# Patient Record
Sex: Female | Born: 1965
Health system: Southern US, Community
[De-identification: ages and names within clinical notes are randomized; demographics above are authoritative.]

## PROBLEM LIST (undated history)

## (undated) DIAGNOSIS — I671 Cerebral aneurysm, nonruptured: Secondary | ICD-10-CM

## (undated) DIAGNOSIS — G8929 Other chronic pain: Secondary | ICD-10-CM

## (undated) DIAGNOSIS — F329 Major depressive disorder, single episode, unspecified: Secondary | ICD-10-CM

## (undated) DIAGNOSIS — I7300 Raynaud's syndrome without gangrene: Secondary | ICD-10-CM

## (undated) DIAGNOSIS — F419 Anxiety disorder, unspecified: Secondary | ICD-10-CM

## (undated) DIAGNOSIS — I10 Essential (primary) hypertension: Secondary | ICD-10-CM

## (undated) DIAGNOSIS — E0500 Thyrotoxicosis with diffuse goiter without thyrotoxic crisis or storm: Secondary | ICD-10-CM

## (undated) DIAGNOSIS — M1990 Unspecified osteoarthritis, unspecified site: Secondary | ICD-10-CM

## (undated) DIAGNOSIS — M549 Dorsalgia, unspecified: Secondary | ICD-10-CM

## (undated) DIAGNOSIS — E890 Postprocedural hypothyroidism: Secondary | ICD-10-CM

## (undated) DIAGNOSIS — F1911 Other psychoactive substance abuse, in remission: Secondary | ICD-10-CM

## (undated) DIAGNOSIS — F32A Depression, unspecified: Secondary | ICD-10-CM

## (undated) DIAGNOSIS — R942 Abnormal results of pulmonary function studies: Secondary | ICD-10-CM

## (undated) DIAGNOSIS — F17200 Nicotine dependence, unspecified, uncomplicated: Secondary | ICD-10-CM

## (undated) HISTORY — DX: Unspecified osteoarthritis, unspecified site: M19.90

## (undated) HISTORY — DX: Abnormal results of pulmonary function studies: R94.2

## (undated) HISTORY — PX: ABDOMINAL HYSTERECTOMY: SHX81

## (undated) HISTORY — DX: Anxiety disorder, unspecified: F41.9

## (undated) HISTORY — DX: Major depressive disorder, single episode, unspecified: F32.9

## (undated) HISTORY — DX: Other psychoactive substance abuse, in remission: F19.11

## (undated) HISTORY — PX: TONSILLECTOMY: SUR1361

## (undated) HISTORY — DX: Raynaud's syndrome without gangrene: I73.00

## (undated) HISTORY — DX: Thyrotoxicosis with diffuse goiter without thyrotoxic crisis or storm: E05.00

## (undated) HISTORY — DX: Depression, unspecified: F32.A

## (undated) HISTORY — DX: Postprocedural hypothyroidism: E89.0

## (undated) HISTORY — PX: WISDOM TOOTH EXTRACTION: SHX21

## (undated) HISTORY — DX: Dorsalgia, unspecified: M54.9

## (undated) HISTORY — DX: Other chronic pain: G89.29

## (undated) HISTORY — DX: Nicotine dependence, unspecified, uncomplicated: F17.200

---

## 1998-03-10 ENCOUNTER — Other Ambulatory Visit: Admission: RE | Admit: 1998-03-10 | Discharge: 1998-03-10 | Payer: Self-pay | Admitting: Family Medicine

## 1998-09-25 ENCOUNTER — Emergency Department (HOSPITAL_COMMUNITY): Admission: EM | Admit: 1998-09-25 | Discharge: 1998-09-26 | Payer: Self-pay | Admitting: Emergency Medicine

## 1999-02-04 ENCOUNTER — Emergency Department (HOSPITAL_COMMUNITY): Admission: EM | Admit: 1999-02-04 | Discharge: 1999-02-04 | Payer: Self-pay | Admitting: Emergency Medicine

## 1999-08-23 ENCOUNTER — Emergency Department (HOSPITAL_COMMUNITY): Admission: EM | Admit: 1999-08-23 | Discharge: 1999-08-24 | Payer: Self-pay | Admitting: Emergency Medicine

## 2000-11-13 ENCOUNTER — Emergency Department (HOSPITAL_COMMUNITY): Admission: EM | Admit: 2000-11-13 | Discharge: 2000-11-13 | Payer: Self-pay | Admitting: Emergency Medicine

## 2000-11-13 ENCOUNTER — Encounter: Payer: Self-pay | Admitting: Emergency Medicine

## 2001-03-27 ENCOUNTER — Other Ambulatory Visit: Admission: RE | Admit: 2001-03-27 | Discharge: 2001-03-27 | Payer: Self-pay | Admitting: Internal Medicine

## 2001-05-17 ENCOUNTER — Encounter: Payer: Self-pay | Admitting: Internal Medicine

## 2001-05-17 ENCOUNTER — Ambulatory Visit (HOSPITAL_COMMUNITY): Admission: RE | Admit: 2001-05-17 | Discharge: 2001-05-17 | Payer: Self-pay | Admitting: Internal Medicine

## 2001-05-25 ENCOUNTER — Encounter: Payer: Self-pay | Admitting: Internal Medicine

## 2001-05-25 ENCOUNTER — Ambulatory Visit (HOSPITAL_COMMUNITY): Admission: RE | Admit: 2001-05-25 | Discharge: 2001-05-25 | Payer: Self-pay | Admitting: Internal Medicine

## 2001-05-31 ENCOUNTER — Encounter: Payer: Self-pay | Admitting: Internal Medicine

## 2001-05-31 ENCOUNTER — Encounter: Admission: RE | Admit: 2001-05-31 | Discharge: 2001-05-31 | Payer: Self-pay | Admitting: Internal Medicine

## 2001-08-14 ENCOUNTER — Encounter: Payer: Self-pay | Admitting: Internal Medicine

## 2001-08-14 ENCOUNTER — Encounter: Admission: RE | Admit: 2001-08-14 | Discharge: 2001-08-14 | Payer: Self-pay | Admitting: Internal Medicine

## 2001-08-25 ENCOUNTER — Ambulatory Visit (HOSPITAL_COMMUNITY): Admission: RE | Admit: 2001-08-25 | Discharge: 2001-08-28 | Payer: Self-pay | Admitting: Obstetrics and Gynecology

## 2001-08-28 ENCOUNTER — Encounter: Payer: Self-pay | Admitting: Obstetrics and Gynecology

## 2001-10-04 ENCOUNTER — Emergency Department (HOSPITAL_COMMUNITY): Admission: EM | Admit: 2001-10-04 | Discharge: 2001-10-04 | Payer: Self-pay | Admitting: Emergency Medicine

## 2001-11-08 ENCOUNTER — Encounter: Admission: RE | Admit: 2001-11-08 | Discharge: 2001-11-08 | Payer: Self-pay

## 2001-11-16 ENCOUNTER — Emergency Department (HOSPITAL_COMMUNITY): Admission: EM | Admit: 2001-11-16 | Discharge: 2001-11-16 | Payer: Self-pay

## 2001-11-17 ENCOUNTER — Emergency Department (HOSPITAL_COMMUNITY): Admission: EM | Admit: 2001-11-17 | Discharge: 2001-11-17 | Payer: Self-pay | Admitting: Emergency Medicine

## 2002-01-03 ENCOUNTER — Emergency Department (HOSPITAL_COMMUNITY): Admission: EM | Admit: 2002-01-03 | Discharge: 2002-01-03 | Payer: Self-pay | Admitting: Emergency Medicine

## 2002-01-27 ENCOUNTER — Emergency Department (HOSPITAL_COMMUNITY): Admission: EM | Admit: 2002-01-27 | Discharge: 2002-01-27 | Payer: Self-pay | Admitting: Emergency Medicine

## 2002-02-03 ENCOUNTER — Emergency Department: Admission: EM | Admit: 2002-02-03 | Discharge: 2002-02-03 | Payer: Self-pay

## 2002-02-07 ENCOUNTER — Inpatient Hospital Stay (HOSPITAL_COMMUNITY): Admission: RE | Admit: 2002-02-07 | Discharge: 2002-02-11 | Payer: Self-pay | Admitting: Obstetrics

## 2002-02-07 ENCOUNTER — Encounter (INDEPENDENT_AMBULATORY_CARE_PROVIDER_SITE_OTHER): Payer: Self-pay

## 2002-06-14 ENCOUNTER — Ambulatory Visit (HOSPITAL_BASED_OUTPATIENT_CLINIC_OR_DEPARTMENT_OTHER): Admission: RE | Admit: 2002-06-14 | Discharge: 2002-06-14 | Payer: Self-pay | Admitting: Family Medicine

## 2002-09-06 ENCOUNTER — Emergency Department (HOSPITAL_COMMUNITY): Admission: EM | Admit: 2002-09-06 | Discharge: 2002-09-06 | Payer: Self-pay | Admitting: Emergency Medicine

## 2002-10-08 ENCOUNTER — Emergency Department (HOSPITAL_COMMUNITY): Admission: EM | Admit: 2002-10-08 | Discharge: 2002-10-08 | Payer: Self-pay | Admitting: Emergency Medicine

## 2002-10-27 ENCOUNTER — Emergency Department (HOSPITAL_COMMUNITY): Admission: EM | Admit: 2002-10-27 | Discharge: 2002-10-27 | Payer: Self-pay | Admitting: Emergency Medicine

## 2002-11-21 ENCOUNTER — Ambulatory Visit (HOSPITAL_COMMUNITY): Admission: RE | Admit: 2002-11-21 | Discharge: 2002-11-21 | Payer: Self-pay | Admitting: Neurology

## 2002-11-21 ENCOUNTER — Encounter: Payer: Self-pay | Admitting: Neurology

## 2002-12-09 ENCOUNTER — Emergency Department (HOSPITAL_COMMUNITY): Admission: EM | Admit: 2002-12-09 | Discharge: 2002-12-09 | Payer: Self-pay | Admitting: Emergency Medicine

## 2003-01-20 ENCOUNTER — Emergency Department (HOSPITAL_COMMUNITY): Admission: EM | Admit: 2003-01-20 | Discharge: 2003-01-20 | Payer: Self-pay | Admitting: Emergency Medicine

## 2003-01-24 ENCOUNTER — Emergency Department (HOSPITAL_COMMUNITY): Admission: EM | Admit: 2003-01-24 | Discharge: 2003-01-24 | Payer: Self-pay | Admitting: Emergency Medicine

## 2003-03-02 ENCOUNTER — Emergency Department (HOSPITAL_COMMUNITY): Admission: EM | Admit: 2003-03-02 | Discharge: 2003-03-02 | Payer: Self-pay | Admitting: Emergency Medicine

## 2003-04-23 ENCOUNTER — Emergency Department (HOSPITAL_COMMUNITY): Admission: EM | Admit: 2003-04-23 | Discharge: 2003-04-23 | Payer: Self-pay | Admitting: Emergency Medicine

## 2003-05-22 ENCOUNTER — Emergency Department (HOSPITAL_COMMUNITY): Admission: EM | Admit: 2003-05-22 | Discharge: 2003-05-22 | Payer: Self-pay | Admitting: Emergency Medicine

## 2003-06-17 ENCOUNTER — Emergency Department (HOSPITAL_COMMUNITY): Admission: EM | Admit: 2003-06-17 | Discharge: 2003-06-17 | Payer: Self-pay | Admitting: Emergency Medicine

## 2003-07-29 ENCOUNTER — Emergency Department (HOSPITAL_COMMUNITY): Admission: EM | Admit: 2003-07-29 | Discharge: 2003-07-29 | Payer: Self-pay | Admitting: Emergency Medicine

## 2003-10-28 ENCOUNTER — Emergency Department (HOSPITAL_COMMUNITY): Admission: EM | Admit: 2003-10-28 | Discharge: 2003-10-29 | Payer: Self-pay | Admitting: Emergency Medicine

## 2003-12-05 ENCOUNTER — Emergency Department (HOSPITAL_COMMUNITY): Admission: EM | Admit: 2003-12-05 | Discharge: 2003-12-05 | Payer: Self-pay | Admitting: Emergency Medicine

## 2004-01-22 ENCOUNTER — Inpatient Hospital Stay (HOSPITAL_COMMUNITY): Admission: AD | Admit: 2004-01-22 | Discharge: 2004-01-22 | Payer: Self-pay | Admitting: Obstetrics and Gynecology

## 2004-01-29 ENCOUNTER — Ambulatory Visit (HOSPITAL_COMMUNITY): Admission: RE | Admit: 2004-01-29 | Discharge: 2004-01-29 | Payer: Self-pay | Admitting: Internal Medicine

## 2004-01-29 ENCOUNTER — Emergency Department (HOSPITAL_COMMUNITY): Admission: EM | Admit: 2004-01-29 | Discharge: 2004-01-29 | Payer: Self-pay | Admitting: Emergency Medicine

## 2004-03-03 ENCOUNTER — Emergency Department (HOSPITAL_COMMUNITY): Admission: EM | Admit: 2004-03-03 | Discharge: 2004-03-03 | Payer: Self-pay | Admitting: Emergency Medicine

## 2004-03-04 ENCOUNTER — Emergency Department (HOSPITAL_COMMUNITY): Admission: EM | Admit: 2004-03-04 | Discharge: 2004-03-04 | Payer: Self-pay | Admitting: Emergency Medicine

## 2004-04-19 ENCOUNTER — Emergency Department (HOSPITAL_COMMUNITY): Admission: EM | Admit: 2004-04-19 | Discharge: 2004-04-19 | Payer: Self-pay | Admitting: Emergency Medicine

## 2004-04-26 ENCOUNTER — Emergency Department (HOSPITAL_COMMUNITY): Admission: EM | Admit: 2004-04-26 | Discharge: 2004-04-26 | Payer: Self-pay | Admitting: Emergency Medicine

## 2004-05-28 ENCOUNTER — Ambulatory Visit: Payer: Self-pay | Admitting: Family Medicine

## 2004-05-29 ENCOUNTER — Ambulatory Visit: Payer: Self-pay | Admitting: *Deleted

## 2004-06-04 ENCOUNTER — Emergency Department (HOSPITAL_COMMUNITY): Admission: EM | Admit: 2004-06-04 | Discharge: 2004-06-04 | Payer: Self-pay | Admitting: Emergency Medicine

## 2004-06-11 ENCOUNTER — Ambulatory Visit: Payer: Self-pay | Admitting: *Deleted

## 2004-09-17 ENCOUNTER — Emergency Department (HOSPITAL_COMMUNITY): Admission: EM | Admit: 2004-09-17 | Discharge: 2004-09-17 | Payer: Self-pay | Admitting: Emergency Medicine

## 2004-09-28 ENCOUNTER — Emergency Department (HOSPITAL_COMMUNITY): Admission: EM | Admit: 2004-09-28 | Discharge: 2004-09-28 | Payer: Self-pay | Admitting: Emergency Medicine

## 2004-10-08 ENCOUNTER — Ambulatory Visit: Payer: Self-pay | Admitting: Family Medicine

## 2004-10-29 ENCOUNTER — Ambulatory Visit: Payer: Self-pay | Admitting: Family Medicine

## 2004-11-02 ENCOUNTER — Encounter (HOSPITAL_COMMUNITY): Admission: RE | Admit: 2004-11-02 | Discharge: 2005-01-31 | Payer: Self-pay | Admitting: Allergy and Immunology

## 2004-11-11 ENCOUNTER — Ambulatory Visit: Payer: Self-pay | Admitting: Family Medicine

## 2004-12-01 ENCOUNTER — Ambulatory Visit: Payer: Self-pay | Admitting: Family Medicine

## 2004-12-15 ENCOUNTER — Ambulatory Visit: Payer: Self-pay | Admitting: Family Medicine

## 2004-12-16 ENCOUNTER — Ambulatory Visit: Payer: Self-pay | Admitting: Family Medicine

## 2005-03-15 ENCOUNTER — Emergency Department (HOSPITAL_COMMUNITY): Admission: EM | Admit: 2005-03-15 | Discharge: 2005-03-15 | Payer: Self-pay | Admitting: Emergency Medicine

## 2005-03-20 ENCOUNTER — Emergency Department (HOSPITAL_COMMUNITY): Admission: EM | Admit: 2005-03-20 | Discharge: 2005-03-20 | Payer: Self-pay | Admitting: Emergency Medicine

## 2005-03-24 ENCOUNTER — Ambulatory Visit: Payer: Self-pay | Admitting: Family Medicine

## 2005-04-06 ENCOUNTER — Ambulatory Visit: Payer: Self-pay | Admitting: Internal Medicine

## 2005-04-16 ENCOUNTER — Ambulatory Visit: Payer: Self-pay | Admitting: Family Medicine

## 2005-05-18 ENCOUNTER — Emergency Department (HOSPITAL_COMMUNITY): Admission: EM | Admit: 2005-05-18 | Discharge: 2005-05-18 | Payer: Self-pay | Admitting: Emergency Medicine

## 2005-06-07 ENCOUNTER — Ambulatory Visit: Payer: Self-pay | Admitting: Family Medicine

## 2005-08-22 ENCOUNTER — Emergency Department (HOSPITAL_COMMUNITY): Admission: EM | Admit: 2005-08-22 | Discharge: 2005-08-22 | Payer: Self-pay | Admitting: Emergency Medicine

## 2005-08-30 ENCOUNTER — Ambulatory Visit: Payer: Self-pay | Admitting: Family Medicine

## 2005-09-20 DIAGNOSIS — E0500 Thyrotoxicosis with diffuse goiter without thyrotoxic crisis or storm: Secondary | ICD-10-CM

## 2005-09-20 DIAGNOSIS — I7300 Raynaud's syndrome without gangrene: Secondary | ICD-10-CM

## 2005-09-20 HISTORY — DX: Raynaud's syndrome without gangrene: I73.00

## 2005-09-20 HISTORY — DX: Thyrotoxicosis with diffuse goiter without thyrotoxic crisis or storm: E05.00

## 2005-09-27 ENCOUNTER — Ambulatory Visit (HOSPITAL_COMMUNITY): Admission: RE | Admit: 2005-09-27 | Discharge: 2005-09-27 | Payer: Self-pay | Admitting: Family Medicine

## 2005-10-21 ENCOUNTER — Ambulatory Visit: Payer: Self-pay | Admitting: Family Medicine

## 2005-10-27 ENCOUNTER — Ambulatory Visit (HOSPITAL_COMMUNITY): Admission: RE | Admit: 2005-10-27 | Discharge: 2005-10-27 | Payer: Self-pay | Admitting: Internal Medicine

## 2005-11-09 ENCOUNTER — Emergency Department (HOSPITAL_COMMUNITY): Admission: EM | Admit: 2005-11-09 | Discharge: 2005-11-09 | Payer: Self-pay | Admitting: Emergency Medicine

## 2005-11-11 ENCOUNTER — Ambulatory Visit: Payer: Self-pay | Admitting: Family Medicine

## 2005-11-26 ENCOUNTER — Emergency Department (HOSPITAL_COMMUNITY): Admission: EM | Admit: 2005-11-26 | Discharge: 2005-11-26 | Payer: Self-pay | Admitting: Family Medicine

## 2005-12-02 ENCOUNTER — Ambulatory Visit: Payer: Self-pay | Admitting: Family Medicine

## 2005-12-10 ENCOUNTER — Ambulatory Visit (HOSPITAL_COMMUNITY): Admission: RE | Admit: 2005-12-10 | Discharge: 2005-12-10 | Payer: Self-pay | Admitting: Internal Medicine

## 2005-12-12 ENCOUNTER — Emergency Department (HOSPITAL_COMMUNITY): Admission: EM | Admit: 2005-12-12 | Discharge: 2005-12-12 | Payer: Self-pay | Admitting: Emergency Medicine

## 2006-01-17 ENCOUNTER — Emergency Department (HOSPITAL_COMMUNITY): Admission: EM | Admit: 2006-01-17 | Discharge: 2006-01-17 | Payer: Self-pay | Admitting: Family Medicine

## 2006-02-09 ENCOUNTER — Emergency Department (HOSPITAL_COMMUNITY): Admission: EM | Admit: 2006-02-09 | Discharge: 2006-02-09 | Payer: Self-pay | Admitting: Emergency Medicine

## 2006-02-18 ENCOUNTER — Emergency Department (HOSPITAL_COMMUNITY): Admission: EM | Admit: 2006-02-18 | Discharge: 2006-02-18 | Payer: Self-pay | Admitting: Emergency Medicine

## 2006-04-03 ENCOUNTER — Emergency Department (HOSPITAL_COMMUNITY): Admission: EM | Admit: 2006-04-03 | Discharge: 2006-04-03 | Payer: Self-pay | Admitting: Family Medicine

## 2006-04-15 ENCOUNTER — Emergency Department (HOSPITAL_COMMUNITY): Admission: EM | Admit: 2006-04-15 | Discharge: 2006-04-15 | Payer: Self-pay | Admitting: Emergency Medicine

## 2006-04-30 ENCOUNTER — Emergency Department (HOSPITAL_COMMUNITY): Admission: EM | Admit: 2006-04-30 | Discharge: 2006-04-30 | Payer: Self-pay | Admitting: Emergency Medicine

## 2006-05-17 ENCOUNTER — Emergency Department (HOSPITAL_COMMUNITY): Admission: EM | Admit: 2006-05-17 | Discharge: 2006-05-17 | Payer: Self-pay | Admitting: Emergency Medicine

## 2006-05-24 ENCOUNTER — Ambulatory Visit: Payer: Self-pay | Admitting: Hospitalist

## 2006-05-24 ENCOUNTER — Ambulatory Visit (HOSPITAL_COMMUNITY): Admission: RE | Admit: 2006-05-24 | Discharge: 2006-05-24 | Payer: Self-pay | Admitting: Hospitalist

## 2006-05-27 ENCOUNTER — Emergency Department (HOSPITAL_COMMUNITY): Admission: EM | Admit: 2006-05-27 | Discharge: 2006-05-27 | Payer: Self-pay | Admitting: Family Medicine

## 2006-06-07 ENCOUNTER — Emergency Department (HOSPITAL_COMMUNITY): Admission: EM | Admit: 2006-06-07 | Discharge: 2006-06-07 | Payer: Self-pay | Admitting: Emergency Medicine

## 2006-06-08 ENCOUNTER — Ambulatory Visit: Payer: Self-pay | Admitting: Hospitalist

## 2006-07-05 ENCOUNTER — Emergency Department (HOSPITAL_COMMUNITY): Admission: EM | Admit: 2006-07-05 | Discharge: 2006-07-05 | Payer: Self-pay | Admitting: Family Medicine

## 2006-07-11 ENCOUNTER — Ambulatory Visit: Payer: Self-pay | Admitting: Internal Medicine

## 2006-07-11 ENCOUNTER — Encounter (INDEPENDENT_AMBULATORY_CARE_PROVIDER_SITE_OTHER): Payer: Self-pay | Admitting: Internal Medicine

## 2006-07-11 LAB — CONVERTED CEMR LAB
Basophils percent auto: 0 % (ref 0–1)
Eosinophils Absolute: 0.2 cells/mcL (ref 0.0–0.7)
Eosinophils Relative: 2 % (ref 0–5)
Free T4: 0.85 ng/dL — ABNORMAL LOW (ref 0.89–1.80)
HCT: 40 % (ref 36.0–46.0)
Lymphs Abs: 3.1 10*3/uL (ref 0.7–3.3)
MCHC: 32 g/dL (ref 30.0–36.0)
MCV: 89.5 fL (ref 78.0–100.0)
Neutrophils Relative %: 58 % (ref 43–77)
Platelets: 323 10*3/uL (ref 150–400)
RDW: 13.2 % (ref 11.5–14.0)
TSH: 0.305 microintl units/mL — ABNORMAL LOW (ref 0.350–5.50)

## 2006-08-02 ENCOUNTER — Ambulatory Visit: Payer: Self-pay | Admitting: Internal Medicine

## 2006-08-04 ENCOUNTER — Emergency Department (HOSPITAL_COMMUNITY): Admission: EM | Admit: 2006-08-04 | Discharge: 2006-08-04 | Payer: Self-pay | Admitting: Family Medicine

## 2006-08-10 DIAGNOSIS — R209 Unspecified disturbances of skin sensation: Secondary | ICD-10-CM | POA: Insufficient documentation

## 2006-08-10 DIAGNOSIS — F3289 Other specified depressive episodes: Secondary | ICD-10-CM | POA: Insufficient documentation

## 2006-08-10 DIAGNOSIS — F329 Major depressive disorder, single episode, unspecified: Secondary | ICD-10-CM | POA: Insufficient documentation

## 2006-08-22 ENCOUNTER — Ambulatory Visit: Payer: Self-pay | Admitting: Internal Medicine

## 2006-08-22 LAB — CONVERTED CEMR LAB: Vitamin B-12: 277 pg/mL (ref 211–911)

## 2006-08-31 ENCOUNTER — Emergency Department (HOSPITAL_COMMUNITY): Admission: EM | Admit: 2006-08-31 | Discharge: 2006-08-31 | Payer: Self-pay | Admitting: Emergency Medicine

## 2006-09-09 ENCOUNTER — Ambulatory Visit: Payer: Self-pay | Admitting: Internal Medicine

## 2006-09-09 ENCOUNTER — Encounter (INDEPENDENT_AMBULATORY_CARE_PROVIDER_SITE_OTHER): Payer: Self-pay | Admitting: Ophthalmology

## 2006-09-09 LAB — CONVERTED CEMR LAB: Anti Nuclear Antibody(ANA): NEGATIVE

## 2006-09-11 ENCOUNTER — Ambulatory Visit (HOSPITAL_COMMUNITY): Admission: RE | Admit: 2006-09-11 | Discharge: 2006-09-11 | Payer: Self-pay | Admitting: Ophthalmology

## 2006-09-15 ENCOUNTER — Emergency Department (HOSPITAL_COMMUNITY): Admission: EM | Admit: 2006-09-15 | Discharge: 2006-09-15 | Payer: Self-pay | Admitting: Emergency Medicine

## 2006-09-15 DIAGNOSIS — J309 Allergic rhinitis, unspecified: Secondary | ICD-10-CM | POA: Insufficient documentation

## 2006-09-15 DIAGNOSIS — G4700 Insomnia, unspecified: Secondary | ICD-10-CM | POA: Insufficient documentation

## 2006-09-26 ENCOUNTER — Encounter (INDEPENDENT_AMBULATORY_CARE_PROVIDER_SITE_OTHER): Payer: Self-pay | Admitting: Internal Medicine

## 2006-09-26 ENCOUNTER — Ambulatory Visit: Payer: Self-pay | Admitting: Hospitalist

## 2006-09-26 LAB — CONVERTED CEMR LAB: TSH: 0.18 microintl units/mL — ABNORMAL LOW (ref 0.350–5.50)

## 2006-10-01 ENCOUNTER — Emergency Department (HOSPITAL_COMMUNITY): Admission: EM | Admit: 2006-10-01 | Discharge: 2006-10-01 | Payer: Self-pay | Admitting: Family Medicine

## 2006-10-03 ENCOUNTER — Ambulatory Visit: Payer: Self-pay | Admitting: Hospitalist

## 2006-10-04 ENCOUNTER — Encounter: Payer: Self-pay | Admitting: *Deleted

## 2006-10-04 ENCOUNTER — Ambulatory Visit: Payer: Self-pay | Admitting: Internal Medicine

## 2006-10-05 ENCOUNTER — Ambulatory Visit: Payer: Self-pay | Admitting: Internal Medicine

## 2006-10-07 ENCOUNTER — Ambulatory Visit: Payer: Self-pay | Admitting: Internal Medicine

## 2006-10-11 ENCOUNTER — Ambulatory Visit: Payer: Self-pay | Admitting: Internal Medicine

## 2006-10-12 ENCOUNTER — Ambulatory Visit: Payer: Self-pay | Admitting: Internal Medicine

## 2006-10-14 ENCOUNTER — Ambulatory Visit: Payer: Self-pay | Admitting: Internal Medicine

## 2006-10-18 ENCOUNTER — Ambulatory Visit: Payer: Self-pay | Admitting: Internal Medicine

## 2006-10-25 ENCOUNTER — Ambulatory Visit: Payer: Self-pay | Admitting: Internal Medicine

## 2006-11-01 ENCOUNTER — Ambulatory Visit: Payer: Self-pay | Admitting: Internal Medicine

## 2006-11-03 ENCOUNTER — Telehealth (INDEPENDENT_AMBULATORY_CARE_PROVIDER_SITE_OTHER): Payer: Self-pay | Admitting: *Deleted

## 2006-11-08 ENCOUNTER — Encounter (INDEPENDENT_AMBULATORY_CARE_PROVIDER_SITE_OTHER): Payer: Self-pay | Admitting: Internal Medicine

## 2006-11-10 ENCOUNTER — Telehealth: Payer: Self-pay | Admitting: *Deleted

## 2006-11-11 ENCOUNTER — Ambulatory Visit: Payer: Self-pay | Admitting: Hospitalist

## 2006-11-12 ENCOUNTER — Telehealth (INDEPENDENT_AMBULATORY_CARE_PROVIDER_SITE_OTHER): Payer: Self-pay | Admitting: Internal Medicine

## 2006-11-18 ENCOUNTER — Encounter (INDEPENDENT_AMBULATORY_CARE_PROVIDER_SITE_OTHER): Payer: Self-pay | Admitting: Internal Medicine

## 2006-11-18 ENCOUNTER — Ambulatory Visit: Payer: Self-pay | Admitting: Internal Medicine

## 2006-12-08 ENCOUNTER — Telehealth: Payer: Self-pay | Admitting: *Deleted

## 2006-12-22 ENCOUNTER — Ambulatory Visit: Payer: Self-pay | Admitting: Internal Medicine

## 2007-01-03 LAB — CONVERTED CEMR LAB: TSH: 0.727 microintl units/mL (ref 0.350–5.50)

## 2007-01-17 ENCOUNTER — Telehealth (INDEPENDENT_AMBULATORY_CARE_PROVIDER_SITE_OTHER): Payer: Self-pay | Admitting: *Deleted

## 2007-01-27 ENCOUNTER — Ambulatory Visit: Payer: Self-pay | Admitting: *Deleted

## 2007-01-27 DIAGNOSIS — IMO0001 Reserved for inherently not codable concepts without codable children: Secondary | ICD-10-CM | POA: Insufficient documentation

## 2007-01-30 ENCOUNTER — Telehealth: Payer: Self-pay | Admitting: *Deleted

## 2007-02-24 ENCOUNTER — Inpatient Hospital Stay (HOSPITAL_COMMUNITY): Admission: RE | Admit: 2007-02-24 | Discharge: 2007-02-28 | Payer: Self-pay | Admitting: Internal Medicine

## 2007-02-24 ENCOUNTER — Ambulatory Visit: Payer: Self-pay | Admitting: Hospitalist

## 2007-02-24 ENCOUNTER — Telehealth: Payer: Self-pay | Admitting: *Deleted

## 2007-02-24 ENCOUNTER — Ambulatory Visit: Payer: Self-pay | Admitting: Internal Medicine

## 2007-02-24 ENCOUNTER — Encounter (INDEPENDENT_AMBULATORY_CARE_PROVIDER_SITE_OTHER): Payer: Self-pay | Admitting: *Deleted

## 2007-02-24 LAB — CONVERTED CEMR LAB
ALT: 11 units/L (ref 0–35)
AST: 16 units/L (ref 0–37)
Alkaline Phosphatase: 123 units/L — ABNORMAL HIGH (ref 39–117)
Basophils Absolute: 0 10*3/uL (ref 0.0–0.1)
Basophils Relative: 1 % (ref 0–1)
Blood in Urine, dipstick: NEGATIVE
Creatinine, Ser: 0.68 mg/dL (ref 0.40–1.20)
Eosinophils Relative: 2 % (ref 0–5)
HCT: 37.5 % (ref 36.0–46.0)
Hemoglobin: 12.4 g/dL (ref 12.0–15.0)
Ketones, urine, test strip: NEGATIVE
MCHC: 33.2 g/dL (ref 30.0–36.0)
Monocytes Absolute: 0.6 10*3/uL (ref 0.2–0.7)
Nitrite: NEGATIVE
Platelets: 298 10*3/uL (ref 150–400)
RDW: 14.2 % — ABNORMAL HIGH (ref 11.5–14.0)
Total Bilirubin: 0.8 mg/dL (ref 0.3–1.2)
Urobilinogen, UA: NEGATIVE
WBC Urine, dipstick: NEGATIVE
pH: 7.5

## 2007-02-28 ENCOUNTER — Other Ambulatory Visit: Payer: Self-pay | Admitting: Gynecology

## 2007-03-10 ENCOUNTER — Emergency Department (HOSPITAL_COMMUNITY): Admission: EM | Admit: 2007-03-10 | Discharge: 2007-03-10 | Payer: Self-pay | Admitting: Emergency Medicine

## 2007-03-13 ENCOUNTER — Ambulatory Visit: Payer: Self-pay | Admitting: Internal Medicine

## 2007-03-22 ENCOUNTER — Telehealth: Payer: Self-pay | Admitting: *Deleted

## 2007-03-22 ENCOUNTER — Ambulatory Visit: Payer: Self-pay | Admitting: Gynecology

## 2007-03-29 ENCOUNTER — Telehealth (INDEPENDENT_AMBULATORY_CARE_PROVIDER_SITE_OTHER): Payer: Self-pay | Admitting: *Deleted

## 2007-03-29 ENCOUNTER — Ambulatory Visit (HOSPITAL_COMMUNITY): Admission: RE | Admit: 2007-03-29 | Discharge: 2007-03-29 | Payer: Self-pay | Admitting: Family Medicine

## 2007-04-05 ENCOUNTER — Ambulatory Visit: Payer: Self-pay | Admitting: Obstetrics & Gynecology

## 2007-04-05 ENCOUNTER — Encounter: Payer: Self-pay | Admitting: Internal Medicine

## 2007-04-05 ENCOUNTER — Ambulatory Visit: Payer: Self-pay | Admitting: *Deleted

## 2007-04-05 LAB — CONVERTED CEMR LAB
ALT: 9 units/L (ref 0–35)
Basophils Absolute: 0 10*3/uL (ref 0.0–0.1)
CO2: 23 meq/L (ref 19–32)
Creatinine, Ser: 0.74 mg/dL (ref 0.40–1.20)
Eosinophils Relative: 1 % (ref 0–5)
Free T4: 1.2 ng/dL (ref 0.89–1.80)
HCT: 39.6 % (ref 36.0–46.0)
Hemoglobin: 12.5 g/dL (ref 12.0–15.0)
Lymphocytes Relative: 39 % (ref 12–46)
MCHC: 31.6 g/dL (ref 30.0–36.0)
MCV: 91.2 fL (ref 78.0–100.0)
Monocytes Absolute: 0.7 10*3/uL (ref 0.2–0.7)
RDW: 14.9 % — ABNORMAL HIGH (ref 11.5–14.0)
TSH: 1.506 microintl units/mL (ref 0.350–5.50)
Total Bilirubin: 0.4 mg/dL (ref 0.3–1.2)

## 2007-04-09 ENCOUNTER — Emergency Department (HOSPITAL_COMMUNITY): Admission: EM | Admit: 2007-04-09 | Discharge: 2007-04-09 | Payer: Self-pay | Admitting: Emergency Medicine

## 2007-04-11 ENCOUNTER — Encounter (INDEPENDENT_AMBULATORY_CARE_PROVIDER_SITE_OTHER): Payer: Self-pay | Admitting: Internal Medicine

## 2007-04-11 ENCOUNTER — Ambulatory Visit: Payer: Self-pay | Admitting: Internal Medicine

## 2007-04-25 ENCOUNTER — Ambulatory Visit: Payer: Self-pay | Admitting: Internal Medicine

## 2007-05-08 ENCOUNTER — Encounter (INDEPENDENT_AMBULATORY_CARE_PROVIDER_SITE_OTHER): Payer: Self-pay | Admitting: Internal Medicine

## 2007-05-08 ENCOUNTER — Ambulatory Visit: Payer: Self-pay | Admitting: Internal Medicine

## 2007-05-10 ENCOUNTER — Ambulatory Visit (HOSPITAL_COMMUNITY): Admission: RE | Admit: 2007-05-10 | Discharge: 2007-05-10 | Payer: Self-pay | Admitting: Internal Medicine

## 2007-05-11 ENCOUNTER — Telehealth: Payer: Self-pay | Admitting: *Deleted

## 2007-05-15 LAB — CONVERTED CEMR LAB
Albumin: 4.1 g/dL (ref 3.5–5.2)
BUN: 8 mg/dL (ref 6–23)
CO2: 26 meq/L (ref 19–32)
Calcium: 9.1 mg/dL (ref 8.4–10.5)
Chloride: 106 meq/L (ref 96–112)
Glucose, Bld: 96 mg/dL (ref 70–99)
Lymphocytes Relative: 42 % (ref 12–46)
Lymphs Abs: 3.2 10*3/uL (ref 0.7–3.3)
MCV: 90.1 fL (ref 78.0–100.0)
Monocytes Relative: 5 % (ref 3–11)
Neutro Abs: 3.9 10*3/uL (ref 1.7–7.7)
Neutrophils Relative %: 51 % (ref 43–77)
Potassium: 4 meq/L (ref 3.5–5.3)
RBC: 4.24 M/uL (ref 3.87–5.11)
Vitamin B-12: 346 pg/mL (ref 211–911)
WBC: 7.6 10*3/uL (ref 4.0–10.5)

## 2007-06-07 ENCOUNTER — Ambulatory Visit: Payer: Self-pay | Admitting: Internal Medicine

## 2007-06-12 ENCOUNTER — Telehealth: Payer: Self-pay | Admitting: *Deleted

## 2007-06-20 ENCOUNTER — Ambulatory Visit: Payer: Self-pay | Admitting: Hospitalist

## 2007-06-29 ENCOUNTER — Telehealth: Payer: Self-pay | Admitting: *Deleted

## 2007-06-30 ENCOUNTER — Encounter (INDEPENDENT_AMBULATORY_CARE_PROVIDER_SITE_OTHER): Payer: Self-pay | Admitting: Interventional Radiology

## 2007-06-30 ENCOUNTER — Ambulatory Visit (HOSPITAL_COMMUNITY): Admission: RE | Admit: 2007-06-30 | Discharge: 2007-06-30 | Payer: Self-pay | Admitting: Hospitalist

## 2007-07-07 ENCOUNTER — Telehealth: Payer: Self-pay | Admitting: *Deleted

## 2007-07-13 ENCOUNTER — Telehealth: Payer: Self-pay | Admitting: *Deleted

## 2007-07-24 ENCOUNTER — Ambulatory Visit: Payer: Self-pay | Admitting: Internal Medicine

## 2007-07-25 ENCOUNTER — Ambulatory Visit: Payer: Self-pay | Admitting: Internal Medicine

## 2007-07-25 ENCOUNTER — Encounter (INDEPENDENT_AMBULATORY_CARE_PROVIDER_SITE_OTHER): Payer: Self-pay | Admitting: Internal Medicine

## 2007-07-25 LAB — CONVERTED CEMR LAB: T3 Uptake Ratio: 30 % (ref 22.5–37.0)

## 2007-08-20 ENCOUNTER — Emergency Department (HOSPITAL_COMMUNITY): Admission: EM | Admit: 2007-08-20 | Discharge: 2007-08-20 | Payer: Self-pay | Admitting: *Deleted

## 2007-08-23 ENCOUNTER — Ambulatory Visit: Payer: Self-pay | Admitting: Infectious Diseases

## 2007-08-23 ENCOUNTER — Encounter (INDEPENDENT_AMBULATORY_CARE_PROVIDER_SITE_OTHER): Payer: Self-pay | Admitting: Internal Medicine

## 2007-09-05 ENCOUNTER — Ambulatory Visit: Payer: Self-pay | Admitting: Hospitalist

## 2007-09-05 DIAGNOSIS — L299 Pruritus, unspecified: Secondary | ICD-10-CM | POA: Insufficient documentation

## 2007-09-05 LAB — CONVERTED CEMR LAB
AST: 17 units/L (ref 0–37)
Albumin: 4.4 g/dL (ref 3.5–5.2)
BUN: 12 mg/dL (ref 6–23)
Basophils Relative: 0 % (ref 0–1)
Calcium: 9.6 mg/dL (ref 8.4–10.5)
Chloride: 106 meq/L (ref 96–112)
Creatinine, Ser: 0.79 mg/dL (ref 0.40–1.20)
Eosinophils Absolute: 0.1 10*3/uL — ABNORMAL LOW (ref 0.2–0.7)
Glucose, Bld: 94 mg/dL (ref 70–99)
Hemoglobin: 12.7 g/dL (ref 12.0–15.0)
Lymphs Abs: 3.5 10*3/uL (ref 0.7–4.0)
MCHC: 32.9 g/dL (ref 30.0–36.0)
MCV: 93 fL (ref 78.0–100.0)
Monocytes Absolute: 0.6 10*3/uL (ref 0.1–1.0)
Monocytes Relative: 7 % (ref 3–12)
Neutro Abs: 4.2 10*3/uL (ref 1.7–7.7)
RBC: 4.15 M/uL (ref 3.87–5.11)
TSH: 0.829 microintl units/mL (ref 0.350–5.50)
WBC: 8.5 10*3/uL (ref 4.0–10.5)

## 2007-09-06 ENCOUNTER — Telehealth: Payer: Self-pay | Admitting: *Deleted

## 2007-09-06 ENCOUNTER — Telehealth (INDEPENDENT_AMBULATORY_CARE_PROVIDER_SITE_OTHER): Payer: Self-pay | Admitting: *Deleted

## 2007-09-18 ENCOUNTER — Emergency Department (HOSPITAL_COMMUNITY): Admission: EM | Admit: 2007-09-18 | Discharge: 2007-09-18 | Payer: Self-pay | Admitting: Emergency Medicine

## 2007-09-20 ENCOUNTER — Encounter (INDEPENDENT_AMBULATORY_CARE_PROVIDER_SITE_OTHER): Payer: Self-pay | Admitting: Internal Medicine

## 2007-09-20 ENCOUNTER — Ambulatory Visit (HOSPITAL_COMMUNITY): Admission: RE | Admit: 2007-09-20 | Discharge: 2007-09-20 | Payer: Self-pay | Admitting: Hospitalist

## 2007-09-20 ENCOUNTER — Ambulatory Visit: Payer: Self-pay | Admitting: Hospitalist

## 2007-09-26 LAB — CONVERTED CEMR LAB
Free T4: 0.86 ng/dL — ABNORMAL LOW (ref 0.89–1.80)
T3 Uptake Ratio: 32.4 % (ref 22.5–37.0)
TSH: 0.199 microintl units/mL — ABNORMAL LOW (ref 0.350–5.50)

## 2007-09-27 ENCOUNTER — Ambulatory Visit: Payer: Self-pay | Admitting: Internal Medicine

## 2007-10-20 ENCOUNTER — Encounter (INDEPENDENT_AMBULATORY_CARE_PROVIDER_SITE_OTHER): Payer: Self-pay | Admitting: Internal Medicine

## 2007-10-20 ENCOUNTER — Ambulatory Visit: Payer: Self-pay | Admitting: Infectious Disease

## 2007-10-23 LAB — CONVERTED CEMR LAB: Free T4: 1.02 ng/dL (ref 0.89–1.80)

## 2007-11-08 ENCOUNTER — Ambulatory Visit: Payer: Self-pay | Admitting: Obstetrics & Gynecology

## 2007-11-10 ENCOUNTER — Encounter (INDEPENDENT_AMBULATORY_CARE_PROVIDER_SITE_OTHER): Payer: Self-pay | Admitting: Internal Medicine

## 2007-11-22 ENCOUNTER — Ambulatory Visit: Payer: Self-pay | Admitting: *Deleted

## 2007-11-22 ENCOUNTER — Encounter (INDEPENDENT_AMBULATORY_CARE_PROVIDER_SITE_OTHER): Payer: Self-pay | Admitting: Internal Medicine

## 2007-11-23 LAB — CONVERTED CEMR LAB: Free T4: 1.2 ng/dL (ref 0.89–1.80)

## 2007-12-01 ENCOUNTER — Ambulatory Visit (HOSPITAL_COMMUNITY): Admission: RE | Admit: 2007-12-01 | Discharge: 2007-12-01 | Payer: Self-pay | Admitting: Internal Medicine

## 2007-12-04 ENCOUNTER — Ambulatory Visit: Payer: Self-pay | Admitting: Infectious Diseases

## 2007-12-04 ENCOUNTER — Ambulatory Visit (HOSPITAL_COMMUNITY): Admission: RE | Admit: 2007-12-04 | Discharge: 2007-12-04 | Payer: Self-pay | Admitting: Internal Medicine

## 2007-12-04 ENCOUNTER — Telehealth (INDEPENDENT_AMBULATORY_CARE_PROVIDER_SITE_OTHER): Payer: Self-pay | Admitting: Internal Medicine

## 2007-12-04 ENCOUNTER — Encounter (INDEPENDENT_AMBULATORY_CARE_PROVIDER_SITE_OTHER): Payer: Self-pay | Admitting: Internal Medicine

## 2007-12-04 LAB — CONVERTED CEMR LAB
AST: 26 units/L (ref 0–37)
Albumin: 4.1 g/dL (ref 3.5–5.2)
Alkaline Phosphatase: 106 units/L (ref 39–117)
BUN: 7 mg/dL (ref 6–23)
Basophils Absolute: 0 10*3/uL (ref 0.0–0.1)
Bilirubin Urine: NEGATIVE
Blood in Urine, dipstick: NEGATIVE
CO2: 28 meq/L (ref 19–32)
Chloride: 105 meq/L (ref 96–112)
Eosinophils Relative: 2 % (ref 0–5)
Glucose, Urine, Semiquant: NEGATIVE
HCT: 40.3 % (ref 36.0–46.0)
Hemoglobin: 12.6 g/dL (ref 12.0–15.0)
Lymphocytes Relative: 49 % — ABNORMAL HIGH (ref 12–46)
Lymphs Abs: 3.5 10*3/uL (ref 0.7–4.0)
Monocytes Absolute: 0.5 10*3/uL (ref 0.1–1.0)
Neutro Abs: 3.1 10*3/uL (ref 1.7–7.7)
Protein, U semiquant: 30
Protein, ur: NEGATIVE mg/dL
RBC: 4.46 M/uL (ref 3.87–5.11)
Urine Glucose: NEGATIVE mg/dL
Urobilinogen, UA: 0.2
WBC Urine, dipstick: NEGATIVE
WBC: 7.3 10*3/uL (ref 4.0–10.5)

## 2007-12-11 ENCOUNTER — Ambulatory Visit: Payer: Self-pay | Admitting: Hospitalist

## 2007-12-13 ENCOUNTER — Encounter: Admission: RE | Admit: 2007-12-13 | Discharge: 2007-12-13 | Payer: Self-pay | Admitting: Internal Medicine

## 2007-12-21 ENCOUNTER — Telehealth: Payer: Self-pay | Admitting: *Deleted

## 2007-12-25 ENCOUNTER — Telehealth (INDEPENDENT_AMBULATORY_CARE_PROVIDER_SITE_OTHER): Payer: Self-pay | Admitting: Internal Medicine

## 2007-12-25 ENCOUNTER — Telehealth: Payer: Self-pay | Admitting: *Deleted

## 2008-01-22 ENCOUNTER — Ambulatory Visit: Payer: Self-pay | Admitting: Internal Medicine

## 2008-01-22 ENCOUNTER — Encounter (INDEPENDENT_AMBULATORY_CARE_PROVIDER_SITE_OTHER): Payer: Self-pay | Admitting: Internal Medicine

## 2008-01-23 LAB — CONVERTED CEMR LAB
Bilirubin Urine: NEGATIVE
HCT: 41.4 % (ref 36.0–46.0)
Hemoglobin: 13.7 g/dL (ref 12.0–15.0)
Ketones, ur: NEGATIVE mg/dL
MCV: 89 fL (ref 78.0–100.0)
Platelets: 231 10*3/uL (ref 150–400)
RBC: 4.65 M/uL (ref 3.87–5.11)
Specific Gravity, Urine: 1.029 (ref 1.005–1.03)
Urine Glucose: NEGATIVE mg/dL
WBC: 8.5 10*3/uL (ref 4.0–10.5)
pH: 6.5 (ref 5.0–8.0)

## 2008-01-24 ENCOUNTER — Ambulatory Visit: Payer: Self-pay | Admitting: Obstetrics and Gynecology

## 2008-01-26 ENCOUNTER — Ambulatory Visit (HOSPITAL_COMMUNITY): Admission: RE | Admit: 2008-01-26 | Discharge: 2008-01-26 | Payer: Self-pay | Admitting: Family Medicine

## 2008-02-04 ENCOUNTER — Inpatient Hospital Stay (HOSPITAL_COMMUNITY): Admission: AD | Admit: 2008-02-04 | Discharge: 2008-02-04 | Payer: Self-pay | Admitting: Obstetrics and Gynecology

## 2008-02-09 ENCOUNTER — Ambulatory Visit: Payer: Self-pay | Admitting: Gastroenterology

## 2008-02-09 ENCOUNTER — Telehealth: Payer: Self-pay | Admitting: Internal Medicine

## 2008-02-09 LAB — CONVERTED CEMR LAB
Basophils Absolute: 0.1 10*3/uL (ref 0.0–0.1)
Basophils Relative: 0.8 % (ref 0.0–1.0)
Eosinophils Absolute: 0.3 10*3/uL (ref 0.0–0.7)
Eosinophils Relative: 3.7 % (ref 0.0–5.0)
Hemoglobin: 13.2 g/dL (ref 12.0–15.0)
MCHC: 33.2 g/dL (ref 30.0–36.0)
MCV: 89.7 fL (ref 78.0–100.0)
Neutro Abs: 3.1 10*3/uL (ref 1.4–7.7)
RBC: 4.44 M/uL (ref 3.87–5.11)

## 2008-02-13 ENCOUNTER — Encounter: Payer: Self-pay | Admitting: Gastroenterology

## 2008-02-13 ENCOUNTER — Telehealth: Payer: Self-pay | Admitting: Gastroenterology

## 2008-02-14 ENCOUNTER — Encounter: Payer: Self-pay | Admitting: Gastroenterology

## 2008-02-14 ENCOUNTER — Ambulatory Visit: Payer: Self-pay | Admitting: Gastroenterology

## 2008-02-14 HISTORY — PX: COLONOSCOPY: SHX174

## 2008-02-14 LAB — HM COLONOSCOPY

## 2008-02-16 ENCOUNTER — Encounter: Payer: Self-pay | Admitting: Gastroenterology

## 2008-02-20 ENCOUNTER — Telehealth (INDEPENDENT_AMBULATORY_CARE_PROVIDER_SITE_OTHER): Payer: Self-pay | Admitting: Internal Medicine

## 2008-02-21 ENCOUNTER — Telehealth: Payer: Self-pay | Admitting: Gastroenterology

## 2008-03-08 ENCOUNTER — Ambulatory Visit: Payer: Self-pay | Admitting: Internal Medicine

## 2008-03-08 DIAGNOSIS — F4322 Adjustment disorder with anxiety: Secondary | ICD-10-CM | POA: Insufficient documentation

## 2008-03-27 ENCOUNTER — Telehealth (INDEPENDENT_AMBULATORY_CARE_PROVIDER_SITE_OTHER): Payer: Self-pay | Admitting: Internal Medicine

## 2008-03-28 ENCOUNTER — Encounter: Payer: Self-pay | Admitting: Licensed Clinical Social Worker

## 2008-03-28 ENCOUNTER — Encounter (INDEPENDENT_AMBULATORY_CARE_PROVIDER_SITE_OTHER): Payer: Self-pay | Admitting: Internal Medicine

## 2008-03-28 ENCOUNTER — Ambulatory Visit: Payer: Self-pay | Admitting: *Deleted

## 2008-04-15 ENCOUNTER — Telehealth (INDEPENDENT_AMBULATORY_CARE_PROVIDER_SITE_OTHER): Payer: Self-pay | Admitting: Internal Medicine

## 2008-05-20 ENCOUNTER — Telehealth (INDEPENDENT_AMBULATORY_CARE_PROVIDER_SITE_OTHER): Payer: Self-pay | Admitting: Internal Medicine

## 2008-05-31 ENCOUNTER — Ambulatory Visit: Payer: Self-pay | Admitting: Obstetrics & Gynecology

## 2008-06-10 ENCOUNTER — Ambulatory Visit: Payer: Self-pay | Admitting: Internal Medicine

## 2008-06-10 ENCOUNTER — Telehealth: Payer: Self-pay | Admitting: *Deleted

## 2008-06-10 ENCOUNTER — Encounter: Payer: Self-pay | Admitting: Internal Medicine

## 2008-06-10 ENCOUNTER — Encounter: Admission: RE | Admit: 2008-06-10 | Discharge: 2008-06-10 | Payer: Self-pay | Admitting: Internal Medicine

## 2008-06-11 LAB — CONVERTED CEMR LAB: Free T4: 1.2 ng/dL (ref 0.89–1.80)

## 2008-06-13 ENCOUNTER — Encounter: Payer: Self-pay | Admitting: Internal Medicine

## 2008-06-13 ENCOUNTER — Ambulatory Visit: Payer: Self-pay | Admitting: Internal Medicine

## 2008-06-17 ENCOUNTER — Telehealth (INDEPENDENT_AMBULATORY_CARE_PROVIDER_SITE_OTHER): Payer: Self-pay | Admitting: Internal Medicine

## 2008-06-17 ENCOUNTER — Telehealth: Payer: Self-pay | Admitting: Internal Medicine

## 2008-07-29 ENCOUNTER — Ambulatory Visit: Payer: Self-pay | Admitting: Internal Medicine

## 2008-07-29 LAB — CONVERTED CEMR LAB
Free T4: 1.07 ng/dL (ref 0.89–1.80)
TSH: 0.431 microintl units/mL (ref 0.350–4.50)

## 2008-09-08 IMAGING — US US TRANSVAGINAL NON-OB
1 series · 12 of 12 positions shown · non-contrast
Comparison: 02/25/07.

CLINICAL DATA: Follow-up complex left adnexal cystic mass.  
 TRANSVAGINAL PELVIC ULTRASOUND:
TECHNIQUE: Transvaginal ultrasound examination of the pelvis was performed including evaluation of the uterus, ovaries, adnexal regions, and pelvic cul-de-sac.

[Series 1: us transvaginal non-ob · 0.11mm/px · 12 of 12 slices shown]
[im 1/12]
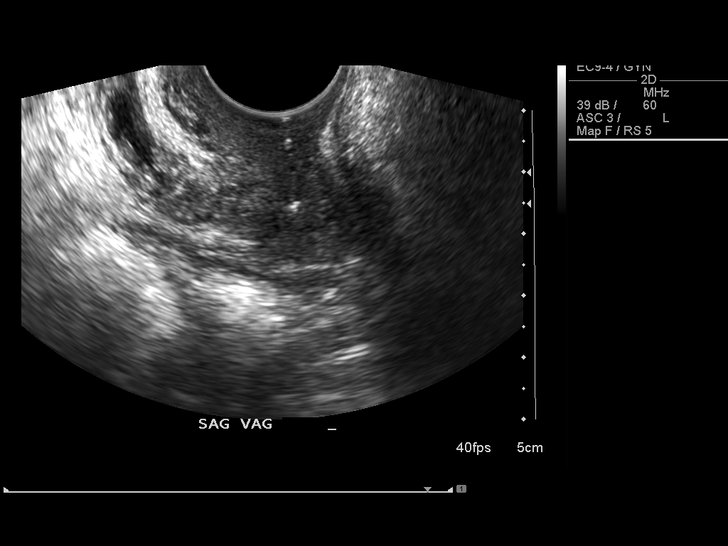
[im 2/12]
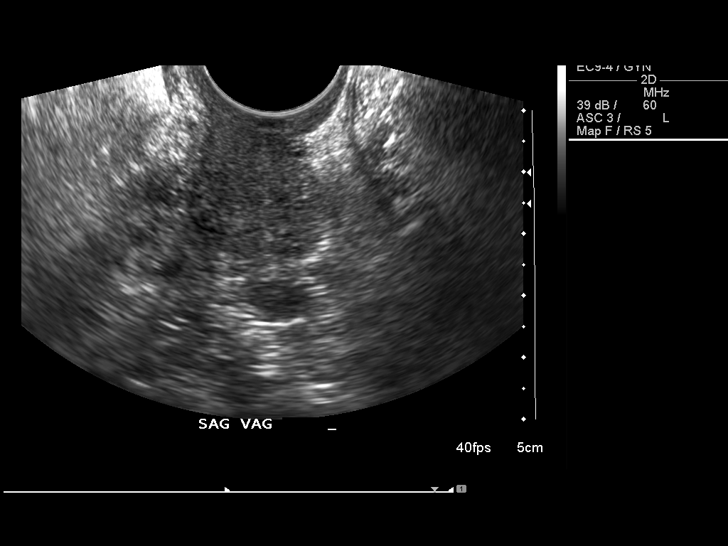
[im 3/12]
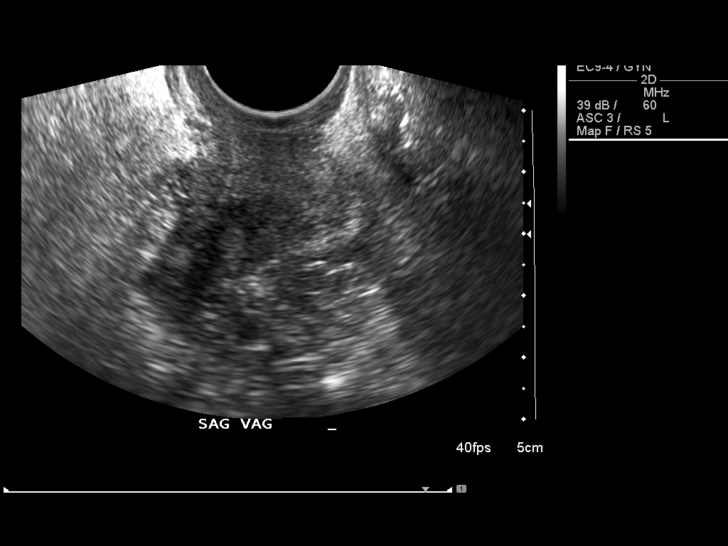
[im 4/12]
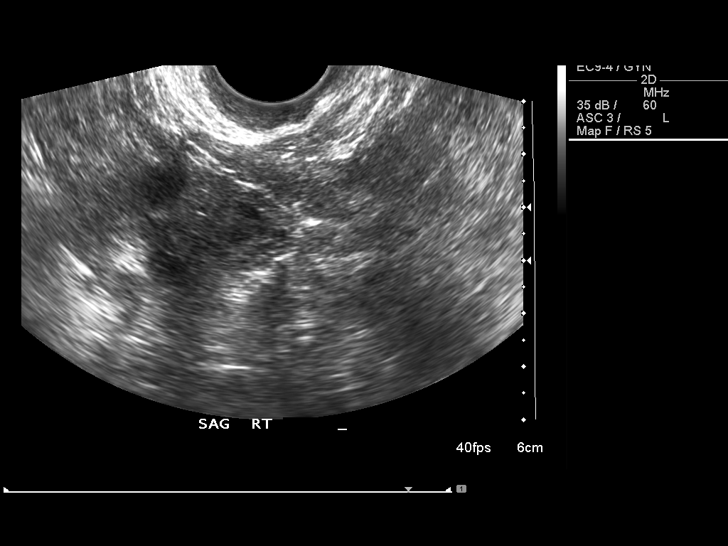
[im 5/12]
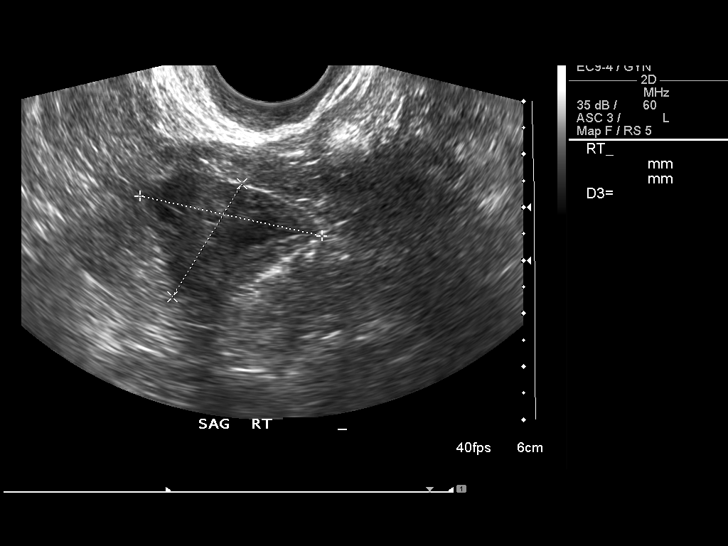
[im 6/12]
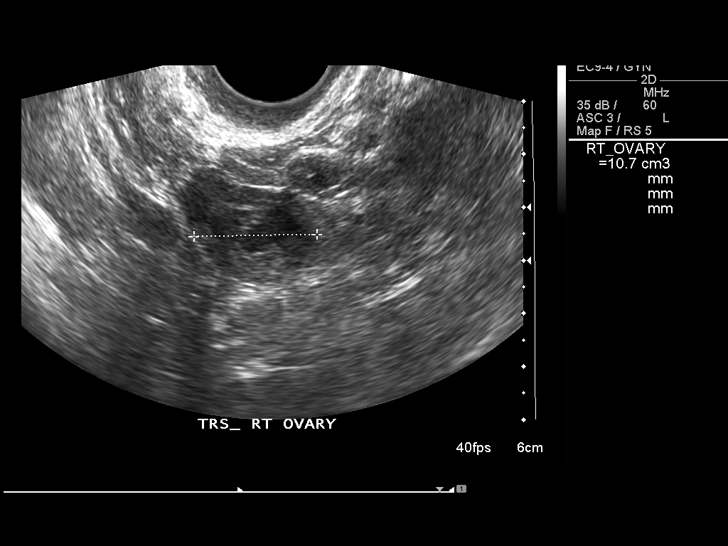
[im 7/12]
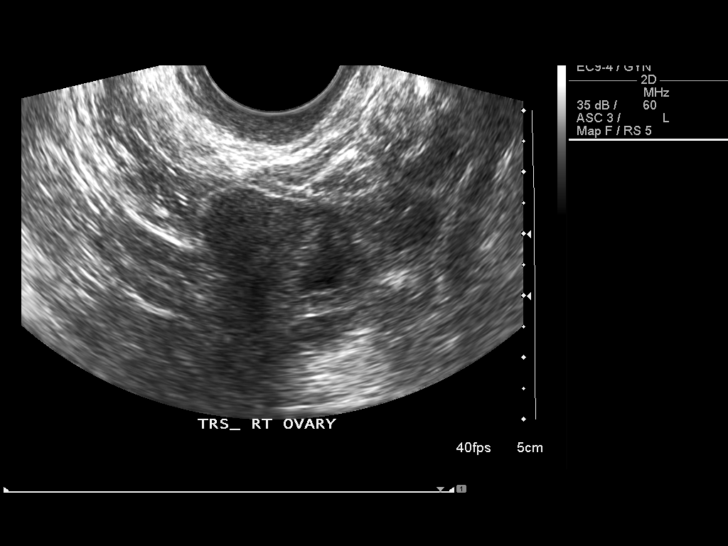
[im 8/12]
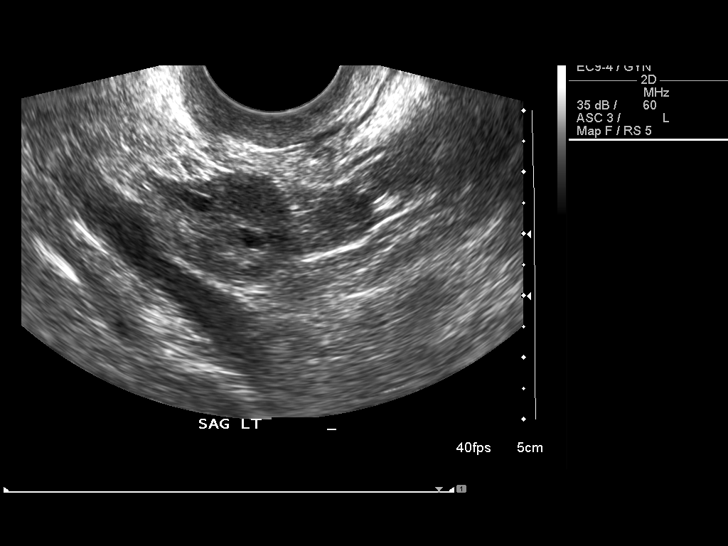
[im 9/12]
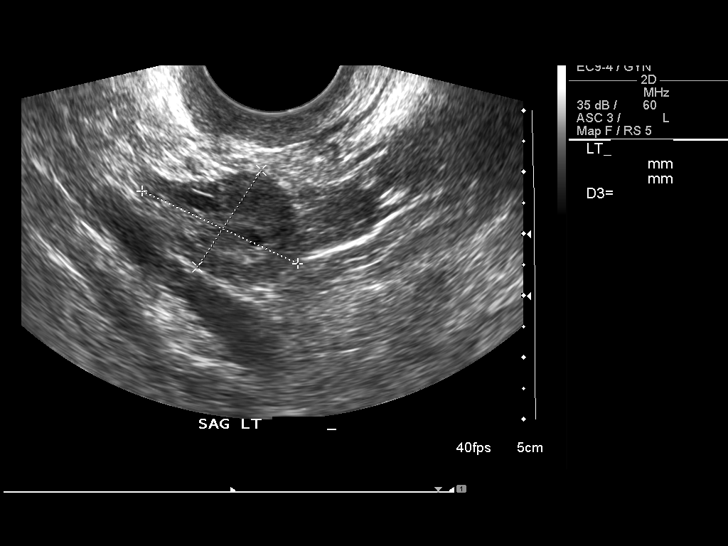
[im 10/12]
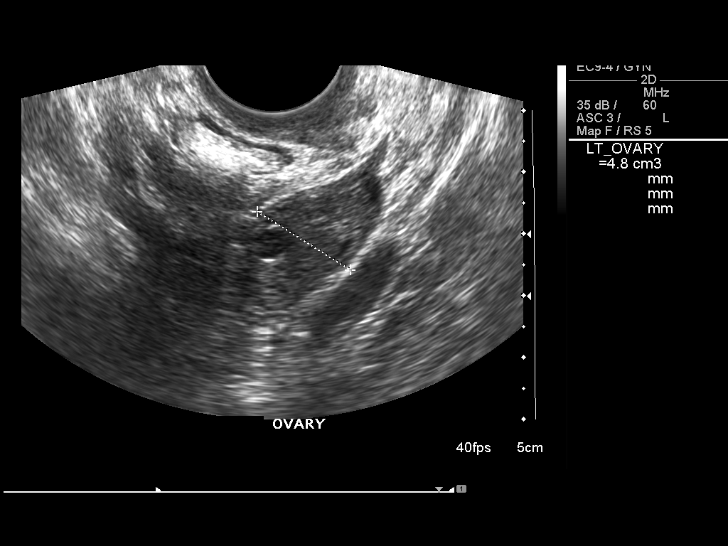
[im 11/12]
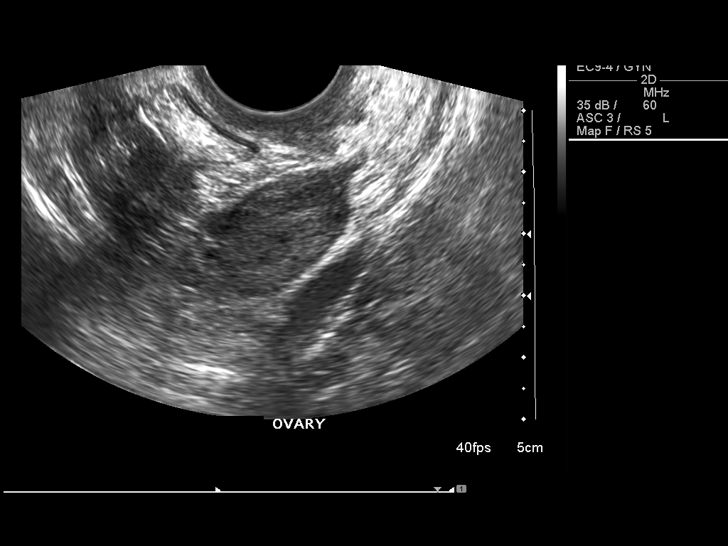
[im 12/12]
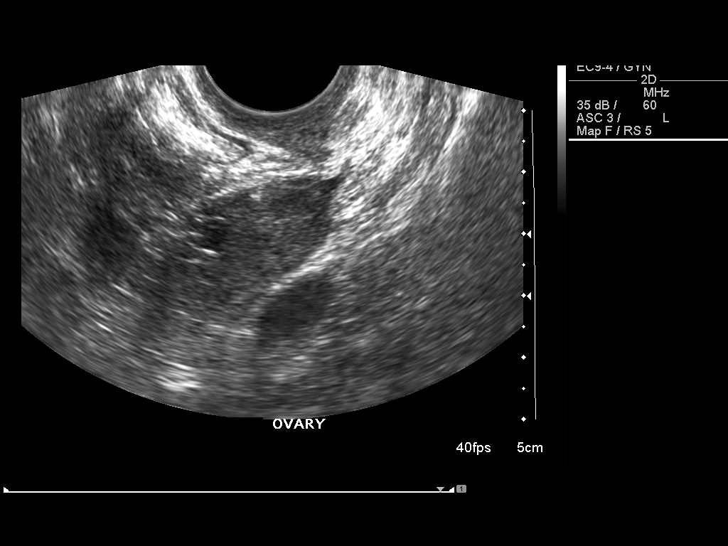

[12 of 12 positions shown; findings below may reference images not displayed]

FINDINGS: Uterus is surgically absent.  Vaginal cuff is unremarkable in appearance.  
 Both ovaries are normal in appearance.  There has been resolution of the complex cystic lesion involving the left ovary since prior study consistent with resolution of hemorrhagic cyst.  There is no evidence of free fluid.
IMPRESSION: 1.  Normal ovaries.  Resolution of complex left ovarian cyst since prior study. 
 2.  Previous hysterectomy.

## 2008-09-26 ENCOUNTER — Telehealth (INDEPENDENT_AMBULATORY_CARE_PROVIDER_SITE_OTHER): Payer: Self-pay | Admitting: Internal Medicine

## 2008-09-30 ENCOUNTER — Ambulatory Visit: Payer: Self-pay | Admitting: Family

## 2008-09-30 ENCOUNTER — Telehealth (INDEPENDENT_AMBULATORY_CARE_PROVIDER_SITE_OTHER): Payer: Self-pay | Admitting: Internal Medicine

## 2008-09-30 ENCOUNTER — Inpatient Hospital Stay (HOSPITAL_COMMUNITY): Admission: AD | Admit: 2008-09-30 | Discharge: 2008-09-30 | Payer: Self-pay | Admitting: Obstetrics & Gynecology

## 2008-10-03 ENCOUNTER — Ambulatory Visit: Payer: Self-pay | Admitting: Internal Medicine

## 2008-10-03 ENCOUNTER — Encounter: Payer: Self-pay | Admitting: Internal Medicine

## 2008-10-03 ENCOUNTER — Encounter (INDEPENDENT_AMBULATORY_CARE_PROVIDER_SITE_OTHER): Payer: Self-pay | Admitting: Internal Medicine

## 2008-10-13 LAB — CONVERTED CEMR LAB
ALT: 15 units/L (ref 0–35)
AST: 22 units/L (ref 0–37)
CO2: 24 meq/L (ref 19–32)
Chloride: 108 meq/L (ref 96–112)
Sodium: 142 meq/L (ref 135–145)
TSH: 0.319 microintl units/mL — ABNORMAL LOW (ref 0.350–4.50)
Total Bilirubin: 0.6 mg/dL (ref 0.3–1.2)
Total Protein: 7.3 g/dL (ref 6.0–8.3)

## 2008-10-18 ENCOUNTER — Telehealth: Payer: Self-pay | Admitting: Licensed Clinical Social Worker

## 2008-10-21 ENCOUNTER — Telehealth (INDEPENDENT_AMBULATORY_CARE_PROVIDER_SITE_OTHER): Payer: Self-pay | Admitting: Internal Medicine

## 2008-10-29 ENCOUNTER — Encounter: Payer: Self-pay | Admitting: Licensed Clinical Social Worker

## 2008-11-03 ENCOUNTER — Emergency Department (HOSPITAL_COMMUNITY): Admission: EM | Admit: 2008-11-03 | Discharge: 2008-11-03 | Payer: Self-pay | Admitting: Emergency Medicine

## 2008-11-11 ENCOUNTER — Telehealth (INDEPENDENT_AMBULATORY_CARE_PROVIDER_SITE_OTHER): Payer: Self-pay | Admitting: Internal Medicine

## 2008-11-14 ENCOUNTER — Ambulatory Visit: Payer: Self-pay | Admitting: Internal Medicine

## 2008-11-14 DIAGNOSIS — I10 Essential (primary) hypertension: Secondary | ICD-10-CM | POA: Insufficient documentation

## 2008-11-15 ENCOUNTER — Encounter (INDEPENDENT_AMBULATORY_CARE_PROVIDER_SITE_OTHER): Payer: Self-pay | Admitting: Internal Medicine

## 2008-11-15 LAB — CONVERTED CEMR LAB: Vitamin B-12: 310 pg/mL (ref 211–911)

## 2008-12-03 ENCOUNTER — Telehealth: Payer: Self-pay | Admitting: *Deleted

## 2008-12-19 ENCOUNTER — Emergency Department (HOSPITAL_COMMUNITY): Admission: EM | Admit: 2008-12-19 | Discharge: 2008-12-19 | Payer: Self-pay | Admitting: Emergency Medicine

## 2008-12-27 ENCOUNTER — Encounter: Admission: RE | Admit: 2008-12-27 | Discharge: 2008-12-27 | Payer: Self-pay | Admitting: Internal Medicine

## 2008-12-27 LAB — HM MAMMOGRAPHY

## 2009-02-07 ENCOUNTER — Telehealth: Payer: Self-pay | Admitting: *Deleted

## 2009-02-13 ENCOUNTER — Ambulatory Visit: Payer: Self-pay | Admitting: Internal Medicine

## 2009-02-13 DIAGNOSIS — K029 Dental caries, unspecified: Secondary | ICD-10-CM | POA: Insufficient documentation

## 2009-02-14 LAB — CONVERTED CEMR LAB
Cholesterol: 172 mg/dL (ref 0–200)
Free T4: 0.93 ng/dL (ref 0.80–1.80)
HDL: 51 mg/dL (ref >39)
Total CHOL/HDL Ratio: 3.4
VLDL: 20 mg/dL (ref 0–40)

## 2009-02-28 ENCOUNTER — Telehealth: Payer: Self-pay | Admitting: *Deleted

## 2009-04-23 ENCOUNTER — Telehealth: Payer: Self-pay | Admitting: *Deleted

## 2009-04-23 ENCOUNTER — Ambulatory Visit: Payer: Self-pay | Admitting: Internal Medicine

## 2009-04-23 DIAGNOSIS — E860 Dehydration: Secondary | ICD-10-CM | POA: Insufficient documentation

## 2009-04-23 LAB — CONVERTED CEMR LAB
BUN: 7 mg/dL (ref 6–23)
Calcium: 9 mg/dL (ref 8.4–10.5)
Chloride: 110 meq/L (ref 96–112)
Creatinine, Ser: 0.7 mg/dL (ref 0.40–1.20)
HCT: 39.4 % (ref 36.0–46.0)
Hemoglobin: 12.7 g/dL (ref 12.0–15.0)
MCHC: 32.2 g/dL (ref 30.0–36.0)
MCV: 94 fL (ref >=78.0)
Platelets: 189 10*3/uL (ref 150–400)
RDW: 12.9 % (ref 11.5–15.5)
TSH: 0.227 microintl units/mL — ABNORMAL LOW (ref 0.350–4.5)
Total CK: 52 units/L (ref 7–177)

## 2009-04-28 ENCOUNTER — Encounter: Payer: Self-pay | Admitting: Internal Medicine

## 2009-05-07 ENCOUNTER — Ambulatory Visit: Payer: Self-pay | Admitting: Internal Medicine

## 2009-05-07 LAB — CONVERTED CEMR LAB: TSH: 0.298 microintl units/mL — ABNORMAL LOW (ref 0.350–4.5)

## 2009-05-08 ENCOUNTER — Telehealth: Payer: Self-pay | Admitting: Internal Medicine

## 2009-05-08 ENCOUNTER — Encounter: Payer: Self-pay | Admitting: Internal Medicine

## 2009-05-12 ENCOUNTER — Ambulatory Visit: Payer: Self-pay | Admitting: Internal Medicine

## 2009-05-12 ENCOUNTER — Encounter (INDEPENDENT_AMBULATORY_CARE_PROVIDER_SITE_OTHER): Payer: Self-pay | Admitting: Internal Medicine

## 2009-05-12 DIAGNOSIS — E0590 Thyrotoxicosis, unspecified without thyrotoxic crisis or storm: Secondary | ICD-10-CM | POA: Insufficient documentation

## 2009-06-26 ENCOUNTER — Ambulatory Visit: Payer: Self-pay | Admitting: Internal Medicine

## 2009-06-30 LAB — CONVERTED CEMR LAB: TSH: 0.672 microintl units/mL (ref 0.350–4.5)

## 2009-07-09 ENCOUNTER — Ambulatory Visit: Payer: Self-pay | Admitting: Internal Medicine

## 2009-07-28 ENCOUNTER — Telehealth: Payer: Self-pay | Admitting: Internal Medicine

## 2009-07-29 ENCOUNTER — Telehealth: Payer: Self-pay | Admitting: *Deleted

## 2009-07-30 ENCOUNTER — Telehealth: Payer: Self-pay | Admitting: *Deleted

## 2009-09-11 ENCOUNTER — Ambulatory Visit: Payer: Self-pay | Admitting: Internal Medicine

## 2009-09-11 ENCOUNTER — Encounter: Payer: Self-pay | Admitting: Internal Medicine

## 2009-09-11 ENCOUNTER — Observation Stay (HOSPITAL_COMMUNITY): Admission: EM | Admit: 2009-09-11 | Discharge: 2009-09-13 | Payer: Self-pay | Admitting: Emergency Medicine

## 2009-09-12 ENCOUNTER — Encounter: Payer: Self-pay | Admitting: Internal Medicine

## 2009-09-23 ENCOUNTER — Ambulatory Visit: Payer: Self-pay | Admitting: Internal Medicine

## 2009-09-23 LAB — CONVERTED CEMR LAB
BUN: 16 mg/dL (ref 6–23)
CO2: 25 meq/L (ref 19–32)
Chloride: 101 meq/L (ref 96–112)
Creatinine, Ser: 0.72 mg/dL (ref 0.40–1.20)
Potassium: 4 meq/L (ref 3.5–5.3)

## 2009-09-26 ENCOUNTER — Encounter: Payer: Self-pay | Admitting: Internal Medicine

## 2009-10-02 ENCOUNTER — Telehealth (INDEPENDENT_AMBULATORY_CARE_PROVIDER_SITE_OTHER): Payer: Self-pay | Admitting: Dermatology

## 2009-10-03 ENCOUNTER — Ambulatory Visit: Payer: Self-pay | Admitting: Internal Medicine

## 2009-10-03 DIAGNOSIS — R519 Headache, unspecified: Secondary | ICD-10-CM | POA: Insufficient documentation

## 2009-10-03 DIAGNOSIS — R51 Headache: Secondary | ICD-10-CM | POA: Insufficient documentation

## 2009-10-03 LAB — CONVERTED CEMR LAB
BUN: 18 mg/dL (ref 6–23)
Creatinine, Ser: 0.68 mg/dL (ref 0.40–1.20)
Glucose, Bld: 93 mg/dL (ref 70–99)
Potassium: 3.9 meq/L (ref 3.5–5.3)
Vitamin B-12: 410 pg/mL (ref 211–911)

## 2009-10-21 ENCOUNTER — Ambulatory Visit: Payer: Self-pay | Admitting: Internal Medicine

## 2009-10-21 LAB — CONVERTED CEMR LAB: Cholesterol, target level: 200 mg/dL

## 2009-10-25 ENCOUNTER — Emergency Department (HOSPITAL_COMMUNITY): Admission: EM | Admit: 2009-10-25 | Discharge: 2009-10-25 | Payer: Self-pay | Admitting: Emergency Medicine

## 2009-11-02 ENCOUNTER — Emergency Department (HOSPITAL_COMMUNITY): Admission: EM | Admit: 2009-11-02 | Discharge: 2009-11-02 | Payer: Self-pay | Admitting: Emergency Medicine

## 2009-12-23 ENCOUNTER — Telehealth: Payer: Self-pay | Admitting: Internal Medicine

## 2010-01-06 ENCOUNTER — Telehealth: Payer: Self-pay | Admitting: Internal Medicine

## 2010-01-16 ENCOUNTER — Telehealth: Payer: Self-pay | Admitting: Internal Medicine

## 2010-02-13 ENCOUNTER — Telehealth: Payer: Self-pay | Admitting: Internal Medicine

## 2010-05-19 ENCOUNTER — Emergency Department (HOSPITAL_COMMUNITY): Admission: EM | Admit: 2010-05-19 | Discharge: 2010-05-20 | Payer: Self-pay | Admitting: Emergency Medicine

## 2010-05-30 ENCOUNTER — Emergency Department (HOSPITAL_COMMUNITY): Admission: EM | Admit: 2010-05-30 | Discharge: 2010-05-30 | Payer: Self-pay | Admitting: Emergency Medicine

## 2010-06-08 ENCOUNTER — Telehealth: Payer: Self-pay | Admitting: Internal Medicine

## 2010-06-10 ENCOUNTER — Telehealth: Payer: Self-pay | Admitting: Internal Medicine

## 2010-07-01 ENCOUNTER — Ambulatory Visit: Payer: Self-pay | Admitting: Internal Medicine

## 2010-07-01 DIAGNOSIS — N951 Menopausal and female climacteric states: Secondary | ICD-10-CM | POA: Insufficient documentation

## 2010-07-22 LAB — CONVERTED CEMR LAB
Free T4: 0.91 ng/dL (ref 0.80–1.80)
TSH: 0.186 microintl units/mL — ABNORMAL LOW (ref 0.350–4.5)

## 2010-07-28 ENCOUNTER — Telehealth: Payer: Self-pay | Admitting: Internal Medicine

## 2010-08-06 ENCOUNTER — Ambulatory Visit: Payer: Self-pay | Admitting: Internal Medicine

## 2010-08-21 ENCOUNTER — Emergency Department (HOSPITAL_COMMUNITY)
Admission: EM | Admit: 2010-08-21 | Discharge: 2010-08-21 | Payer: Self-pay | Source: Home / Self Care | Admitting: Emergency Medicine

## 2010-09-24 ENCOUNTER — Ambulatory Visit: Admit: 2010-09-24 | Payer: Self-pay

## 2010-10-10 ENCOUNTER — Encounter: Payer: Self-pay | Admitting: Internal Medicine

## 2010-10-11 ENCOUNTER — Encounter: Payer: Self-pay | Admitting: Internal Medicine

## 2010-10-20 NOTE — Consult Note (Signed)
Summary: Amy [name]: Office Visit  [facility]: Office Visit   Imported By: Florinda Marker 10/02/2009 13:53:47  _____________________________________________________________________  External Attachment:    Type:   Image     Comment:   External Document

## 2010-10-20 NOTE — Progress Notes (Signed)
Summary: Refill/gh  Phone Note Refill Request Message from:  Fax from Pharmacy on January 16, 2010 1:45 PM  Refills Requested: Medication #1:  AMITRIPTYLINE HCL 50 MG TABS Take 1 tablet by mouth at bedtime   Last Refilled: 12/22/2009  Method Requested: Fax to Local Pharmacy Initial call taken by: Angelina Ok RN,  January 16, 2010 1:45 PM  Follow-up for Phone Call        completed refill, thank you Veryl Winemiller  Follow-up by: Mliss Sax MD,  January 16, 2010 3:07 PM    Prescriptions: AMITRIPTYLINE HCL 50 MG TABS (AMITRIPTYLINE HCL) Take 1 tablet by mouth at bedtime  #30 x 3   Entered and Authorized by:   Mliss Sax MD   Signed by:   Mliss Sax MD on 01/16/2010   Method used:   Electronically to        [facility]  [address] [id number]ail[facility]       [address]       [place], [place]  [address]       Ph: [phone]       Fax: [phone]   RxID:   [id number]   Appended Document: Refill/gh Rx was sent to wrong pharmacy.  I cancelled [facility] and called into [facility]

## 2010-10-20 NOTE — Assessment & Plan Note (Signed)
Summary: ACUTE-THYROID PROBLEMS-SHARP STOMACH PAIN(   Vital Signs:  Patient profile:   45 year old female Height:      65.5 inches Weight:      185.2 pounds BMI:     30.46 Temp:     97.4 degrees F oral Pulse rate:   88 / minute BP sitting:   120 / 78  (right arm)  Vitals Entered By: Filomena Jungling NT II (August 06, 2010 11:39 AM) CC: Depression Nutritional Status BMI of > 30 = obese  Have you ever been in a relationship where you felt threatened, hurt or afraid?No   Does patient need assistance? Functional Status Self care Ambulation Normal   Primary Care Provider:  Mliss Sax MD  CC:  Depression.  History of Present Illness: Pt is a 45 yo AAF with PMH of hyperthroidism and depression who came here for getting her methimazole. She has hot flush, papiatation and sweating. We checked TSH and free T4, TSH low and restated her methimazole 2 days ago from health Dept. She still has the symptoms, no wosre. Wants to get more prescription as she has only got one prescription. No other c/o. No smoking, ETOH or drugs.   Depression History:      The patient denies a depressed mood most of the day and a diminished interest in her usual daily activities.         Preventive Screening-Counseling & Management  Alcohol-Tobacco     Alcohol drinks/day: 0     Smoking Status: quit < 6 months     Smoking Cessation Counseling: no     Packs/Day: 1/2     Year Started: QUIT FOR 3 YRS / STARTED BACK 12/08/quit 12/2007/restarted smoking     Year Quit: 12/2007     Passive Smoke Exposure: no     Tobacco Counseling: to remain off tobacco products  Caffeine-Diet-Exercise     Does Patient Exercise: no     Type of exercise: aerobic     Times/week: 2  Problems Prior to Update: 1)  Hot Flashes  (ICD-627.2) 2)  Headache  (ICD-784.0) 3)  Numbness  (ICD-782.0) 4)  Pruritus  (ICD-698.9) 5)  Hyperthyroidism  (ICD-242.90) 6)  Dehydration  (ICD-276.51) 7)  Dental Caries  (ICD-521.00) 8)  Hypertension   (ICD-401.9) 9)  Anxiety Disorder, Situational, Mild  (ICD-309.24) 10)  Myalgia  (ICD-729.1) 11)  Insomnia  (ICD-780.52) 12)  Hx of Grave's Disease  (ICD-242.00) 13)  Allergic Rhinitis  (ICD-477.9) 14)  Depression  (ICD-311)  Medications Prior to Update: 1)  Nexium 40 Mg Cpdr (Esomeprazole Magnesium) .... Take 1 Tablet By Mouth Once A Day 2)  Inderal La 80 Mg Xr24h-Cap (Propranolol Hcl) .... Take 1 Tablet By Mouth Once A Day 3)  Amitriptyline Hcl 50 Mg Tabs (Amitriptyline Hcl) .... Take 1 Tablet By Mouth At Bedtime 4)  Hydrochlorothiazide 25 Mg Tabs (Hydrochlorothiazide) .... Take 1 Tablet By Mouth Once A Day 5)  Methimazole 5 Mg Tabs (Methimazole) .... Take One Tablet By Mouth Daily  Current Medications (verified): 1)  Nexium 40 Mg Cpdr (Esomeprazole Magnesium) .... Take 1 Tablet By Mouth Once A Day 2)  Inderal La 80 Mg Xr24h-Cap (Propranolol Hcl) .... Take 1 Tablet By Mouth Once A Day 3)  Amitriptyline Hcl 50 Mg Tabs (Amitriptyline Hcl) .... Take 1 Tablet By Mouth At Bedtime 4)  Hydrochlorothiazide 25 Mg Tabs (Hydrochlorothiazide) .... Take 1 Tablet By Mouth Once A Day 5)  Methimazole 5 Mg Tabs (Methimazole) .... Take One Tablet By  Mouth Daily  Allergies (verified): No Known Drug Allergies  Past History:  Past Medical History: Last updated: 11/14/2008 Pruritus, began 08/17/07, resolved Allergic rhinitis Depression Graves disease Hyperthyroidism Acne vulgaris Insomnia Raynaud's phenomenon Numbness of b/l upper extremities - extensive w/up negative Complex ovarian cyst -- 02/2007 Melena-Colonoscopy by Dr. Christella Hartigan w/ tubular adenoma and ext hemorrhoids 5/09 (repeat due 5/12) Anxiety, situational  Family History: Last updated: 02/09/2008 Family History of Colon Cancer:  grandfather had colon cancer  Social History: Last updated: 02/13/2009 Pays for medications out of her own pocket. Lost her job 12/08.  05/10: now works at [employer]. single, 2 children. Son got out of jail  in 04/10 and is living with her. Sexually active with 1 female partner.  Risk Factors: Smoking Status: quit < 6 months (08/06/2010) Packs/Day: 1/2 (08/06/2010) Passive Smoke Exposure: no (08/06/2010)  Family History: Reviewed history from 02/09/2008 and no changes required. Family History of Colon Cancer:  grandfather had colon cancer  Social History: Reviewed history from 02/13/2009 and no changes required. Pays for medications out of her own pocket. Lost her job 12/08.  05/10: now works at [employer]. single, 2 children. Son got out of jail in 04/10 and is living with her. Sexually active with 1 female partner.  Review of Systems  The patient denies anorexia, fever, chest pain, syncope, dyspnea on exertion, prolonged cough, headaches, abdominal pain, and melena.    Physical Exam  General:  alert, well-developed, well-nourished, and well-hydrated.   Head:  normocephalic.   Nose:  no nasal discharge.   Mouth:  pharynx pink and moist.   Neck:  supple.   Lungs:  normal respiratory effort, no accessory muscle use, normal breath sounds, no crackles, and no wheezes.   Heart:  regular rhythm, no murmur, no gallop, no JVD, and tachycardia.   Abdomen:  soft, non-tender, normal bowel sounds, and no distention.   Msk:  normal ROM, no joint tenderness, no joint swelling, no joint warmth, and no redness over joints.   Pulses:  2+ Extremities:  Noedema.  Neurologic:  alert & oriented X3, cranial nerves II-XII intact, and gait normal.     Impression & Recommendations:  Problem # 1:  HYPERTHYROIDISM (ICD-242.90) Assessment Unchanged Will continue her meds and give her more refills. Endocrinology referral is in process as she has no insurance. Her updated medication list for this problem includes:    Inderal La 80 Mg Xr24h-cap (Propranolol hcl) .Marland Kitchen... Take 1 tablet by mouth once a day    Methimazole 5 Mg Tabs (Methimazole) .Marland Kitchen... Take one tablet by mouth daily  Problem # 2:  HYPERTENSION  (ICD-401.9) Assessment: Improved BP well controlled and continue current meds and recheck BMET at next visit.  Her updated medication list for this problem includes:    Inderal La 80 Mg Xr24h-cap (Propranolol hcl) .Marland Kitchen... Take 1 tablet by mouth once a day    Hydrochlorothiazide 25 Mg Tabs (Hydrochlorothiazide) .Marland Kitchen... Take 1 tablet by mouth once a day  BP today: 120/78 Prior BP: 125/87 (07/01/2010)  Prior 10 Yr Risk Heart Disease: 4 % (10/21/2009)  Labs Reviewed: K+: 3.9 (10/03/2009) Creat: : 0.68 (10/03/2009)   Chol: 172 (02/13/2009)   HDL: 51 (02/13/2009)   LDL: 101 (02/13/2009)   TG: 99 (02/13/2009)  Complete Medication List: 1)  Nexium 40 Mg Cpdr (Esomeprazole magnesium) .... Take 1 tablet by mouth once a day 2)  Inderal La 80 Mg Xr24h-cap (Propranolol hcl) .... Take 1 tablet by mouth once a day 3)  Amitriptyline  Hcl 50 Mg Tabs (Amitriptyline hcl) .... Take 1 tablet by mouth at bedtime 4)  Hydrochlorothiazide 25 Mg Tabs (Hydrochlorothiazide) .... Take 1 tablet by mouth once a day 5)  Methimazole 5 Mg Tabs (Methimazole) .... Take one tablet by mouth daily  Patient Instructions: 1)  Please schedule a follow-up appointment in 1-2 month. 2)  Please take your medications as instructed. Prescriptions: METHIMAZOLE 5 MG TABS (METHIMAZOLE) Take one tablet by mouth daily  #30 x 3   Entered and Authorized by:   Jackson Latino MD   Signed by:   Jackson Latino MD on 08/06/2010   Method used:   Faxed to ...       [facility] (retail)       [address] [place], [place]  [address]       Ph: [phone]       Fax: [phone]   RxID:   [id number]    Orders Added: 1)  Est. Patient Level III [66440]    Prevention & Chronic Care Immunizations   Influenza vaccine: Fluvax 3+  (06/26/2009)   Influenza vaccine deferral: Refused  (07/01/2010)    Tetanus booster: Not documented   Td booster deferral: Refused  (07/01/2010)    Pneumococcal vaccine: Not  documented  Other Screening   Pap smear: Not documented   Pap smear action/deferral: Not indicated S/P hysterectomy  (05/07/2009)    Mammogram: No specific mammographic evidence of malignancy.  Assessment: BIRADS 2.   (12/27/2008)   Mammogram action/deferral: Ordered  (07/01/2010)   Mammogram due: 12/2009   Smoking status: quit < 6 months  (08/06/2010)  Lipids   Total Cholesterol: 172  (02/13/2009)   Lipid panel action/deferral: Lipid Panel ordered   LDL: 101  (02/13/2009)   LDL Direct: Not documented   HDL: 51  (02/13/2009)   Triglycerides: 99  (02/13/2009)  Hypertension   Last Blood Pressure: 120 / 78  (08/06/2010)   Serum creatinine: 0.68  (10/03/2009)   Serum potassium 3.9  (10/03/2009)    Hypertension flowsheet reviewed?: Yes   Progress toward BP goal: Improved  Self-Management Support :   Personal Goals (by the next clinic visit) :      Personal blood pressure goal: 140/90  (10/21/2009)   Patient will work on the following items until the next clinic visit to reach self-care goals:     Medications and monitoring: take my medicines every day  (08/06/2010)     Eating: eat more vegetables, eat foods that are low in salt, eat baked foods instead of fried foods  (08/06/2010)     Activity: take a 30 minute walk every day  (07/01/2010)    Hypertension self-management support: Education handout  (08/06/2010)   Hypertension education handout printed

## 2010-10-20 NOTE — Assessment & Plan Note (Signed)
Summary: ACUTE/MAGICK/2 WEEK F/U PER VEGA/CH   Vital Signs:  Patient profile:   45 year old female Height:      65.5 inches (166.37 cm) Weight:      181.06 pounds (82.30 kg) BMI:     29.78 Temp:     97.2 degrees F (36.22 degrees C) oral Pulse rate:   83 / minute BP sitting:   120 / 85  (right arm)  Vitals Entered By: Angelina Ok RN (October 21, 2009 8:34 AM) CC: C-V Risk Management, Lipid Management Is Patient Diabetic? No Pain Assessment Patient in pain? no      Nutritional Status BMI of 25 - 29 = overweight  Have you ever been in a relationship where you felt threatened, hurt or afraid?No   Does patient need assistance? Functional Status Self care Ambulation Normal Comments Follow up on headaches.     Primary Care Provider:  Mliss Sax MD  CC:  C-V Risk Management and Lipid Management.  History of Present Illness: 45 yr old woman with pmhx as described below comes to the clinic for follow up of headache. Patient reports that her headaches are much better since starting amitryptiline and topamax. She has noticed that she has more of an appetite but otherwise she is not having any adverse reactions to medicaiton. Only has headaches once in a while.   Numbness is also not much of a problem. She reports to be feeling great.   Depression History:      The patient denies a depressed mood most of the day.               Preventive Screening-Counseling & Management  Alcohol-Tobacco     Alcohol drinks/day: 0     Smoking Status: quit < 6 months     Smoking Cessation Counseling: no     Packs/Day: 1/2     Year Started: QUIT FOR 3 YRS / STARTED BACK 12/08/quit 12/2007/restarted smoking     Year Quit: 12/2007     Passive Smoke Exposure: no  Problems Prior to Update: 1)  Headache  (ICD-784.0) 2)  Numbness  (ICD-782.0) 3)  Pruritus  (ICD-698.9) 4)  Hyperthyroidism  (ICD-242.90) 5)  Dehydration  (ICD-276.51) 6)  Dental Caries  (ICD-521.00) 7)  Hypertension   (ICD-401.9) 8)  Anxiety Disorder, Situational, Mild  (ICD-309.24) 9)  Myalgia  (ICD-729.1) 10)  Insomnia  (ICD-780.52) 11)  Hx of Grave's Disease  (ICD-242.00) 12)  Allergic Rhinitis  (ICD-477.9) 13)  Depression  (ICD-311)  Medications Prior to Update: 1)  Nexium 40 Mg Cpdr (Esomeprazole Magnesium) .... Take 1 Tablet By Mouth Once A Day 2)  Inderal La 80 Mg Xr24h-Cap (Propranolol Hcl) .... Take 1 Tablet By Mouth Once A Day 3)  Amitriptyline Hcl 50 Mg Tabs (Amitriptyline Hcl) .... Take 1 Tablet By Mouth At Bedtime 4)  Hydrochlorothiazide 25 Mg Tabs (Hydrochlorothiazide) .... Take 1 Tablet By Mouth Once A Day 5)  Topamax 25 Mg Tabs (Topiramate) .... Take 1 Tablet By Mouth At Bedtime X 1 Week, Then Take 1 Tablet By Mouth Two Times A Day  Current Medications (verified): 1)  Nexium 40 Mg Cpdr (Esomeprazole Magnesium) .... Take 1 Tablet By Mouth Once A Day 2)  Inderal La 80 Mg Xr24h-Cap (Propranolol Hcl) .... Take 1 Tablet By Mouth Once A Day 3)  Amitriptyline Hcl 50 Mg Tabs (Amitriptyline Hcl) .... Take 1 Tablet By Mouth At Bedtime 4)  Hydrochlorothiazide 25 Mg Tabs (Hydrochlorothiazide) .... Take 1 Tablet By Mouth Once  A Day 5)  Topamax 25 Mg Tabs (Topiramate) .... Take 1 Tablet By Mouth At Bedtime X 1 Week, Then Take 1 Tablet By Mouth Two Times A Day  Allergies: No Known Drug Allergies  Past History:  Past Medical History: Last updated: 11/14/2008 Pruritus, began 08/17/07, resolved Allergic rhinitis Depression Graves disease Hyperthyroidism Acne vulgaris Insomnia Raynaud's phenomenon Numbness of b/l upper extremities - extensive w/up negative Complex ovarian cyst -- 02/2007 Melena-Colonoscopy by Dr. Christella Hartigan w/ tubular adenoma and ext hemorrhoids 5/09 (repeat due 5/12) Anxiety, situational  Past Surgical History: Last updated: 04/11/2007 s/p hysterectomy  Family History: Last updated: 02/09/2008 Family History of Colon Cancer:  grandfather had colon cancer  Social  History: Last updated: 02/13/2009 Pays for medications out of her own pocket. Lost her job 12/08.  05/10: now works at [employer]. single, 2 children. Son got out of jail in 04/10 and is living with her. Sexually active with 1 female partner.  Risk Factors: Alcohol Use: 0 (10/21/2009) Exercise: no (10/03/2009)  Risk Factors: Smoking Status: quit < 6 months (10/21/2009) Packs/Day: 1/2 (10/21/2009) Passive Smoke Exposure: no (10/21/2009)  Family History: Reviewed history from 02/09/2008 and no changes required. Family History of Colon Cancer:  grandfather had colon cancer  Social History: Reviewed history from 02/13/2009 and no changes required. Pays for medications out of her own pocket. Lost her job 12/08.  05/10: now works at [employer]. single, 2 children. Son got out of jail in 04/10 and is living with her. Sexually active with 1 female partner.  Review of Systems  The patient denies fever, chest pain, dyspnea on exertion, peripheral edema, prolonged cough, hemoptysis, abdominal pain, melena, hematochezia, hematuria, muscle weakness, difficulty walking, unusual weight change, and abnormal bleeding.    Physical Exam  General:  NAD Mouth:   pharynx pink and moist, no erythema, and no exudates.    Neck:  supple.   Lungs:  normal respiratory effort and normal breath sounds.   Heart:  normal rate, regular rhythm, and no murmur.   Abdomen:  soft, non-tender, and normal bowel sounds.   Msk:  no joint tenderness and no joint swelling.  no joint warmth, no redness over joints, no joint deformities, and no joint instability.   Extremities:  No clubbing, cyanosis, edema, or deformity noted with normal full range of motion of all joints.   Neurologic:  alert & oriented X3, cranial nerves II-XII intact, strength normal in all extremities, and sensation intact to light touch.   Psych:  normally interactive.     Impression & Recommendations:  Problem # 1:  HEADACHE (ICD-784.0) Much better.  Will continue amitryptiline,  topamax and follow up in one month.   Her updated medication list for this problem includes:    Inderal La 80 Mg Xr24h-cap (Propranolol hcl) .Marland Kitchen... Take 1 tablet by mouth once a day  Headache diary reviewed.  Problem # 2:  NUMBNESS (ICD-782.0) Assessment: Improved Improved. No further work up needed at this time.  Vitamin B12 wnl.   Problem # 3:  HYPERTENSION (ICD-401.9) At goal. Continue current regimen.  Her updated medication list for this problem includes:    Inderal La 80 Mg Xr24h-cap (Propranolol hcl) .Marland Kitchen... Take 1 tablet by mouth once a day    Hydrochlorothiazide 25 Mg Tabs (Hydrochlorothiazide) .Marland Kitchen... Take 1 tablet by mouth once a day  BP today: 120/85 Prior BP: 120/80 (10/03/2009)  Labs Reviewed: K+: 3.9 (10/03/2009) Creat: : 0.68 (10/03/2009)   Chol: 172 (02/13/2009)   HDL: 51 (02/13/2009)  LDL: 101 (02/13/2009)   TG: 99 (02/13/2009)  Problem # 4:  HYPERTHYROIDISM (ICD-242.90) Will follow [facility] work up.   Her updated medication list for this problem includes:    Inderal La 80 Mg Xr24h-cap (Propranolol hcl) .Marland Kitchen... Take 1 tablet by mouth once a day  Labs Reviewed: TSH: 0.672 (06/26/2009)   Free T3: 2.8 (05/08/2009)  Complete Medication List: 1)  Nexium 40 Mg Cpdr (Esomeprazole magnesium) .... Take 1 tablet by mouth once a day 2)  Inderal La 80 Mg Xr24h-cap (Propranolol hcl) .... Take 1 tablet by mouth once a day 3)  Amitriptyline Hcl 50 Mg Tabs (Amitriptyline hcl) .... Take 1 tablet by mouth at bedtime 4)  Hydrochlorothiazide 25 Mg Tabs (Hydrochlorothiazide) .... Take 1 tablet by mouth once a day 5)  Topamax 25 Mg Tabs (Topiramate) .... Take 1 tablet by mouth at bedtime x 1 week, then take 1 tablet by mouth two times a day  Patient Instructions: 1)  Please schedule a follow-up appointment in 1 month. 2)  Continue to take all medication as directed.  Prevention & Chronic Care Immunizations   Influenza vaccine: Fluvax 3+   (06/26/2009)    Tetanus booster: Not documented   Td booster deferral: Not indicated  (05/07/2009)    Pneumococcal vaccine: Not documented  Other Screening   Pap smear: Not documented   Pap smear action/deferral: Not indicated S/P hysterectomy  (05/07/2009)    Mammogram: No specific mammographic evidence of malignancy.  Assessment: BIRADS 2.   (12/27/2008)   Mammogram action/deferral: Screening mammogram in 1 year.     (12/27/2008)   Mammogram due: 12/2009   Smoking status: quit < 6 months  (10/21/2009)  Lipids   Total Cholesterol: 172  (02/13/2009)   LDL: 101  (02/13/2009)   LDL Direct: Not documented   HDL: 51  (02/13/2009)   Triglycerides: 99  (02/13/2009)  Hypertension   Last Blood Pressure: 120 / 85  (10/21/2009)   Serum creatinine: 0.68  (10/03/2009)   Serum potassium 3.9  (10/03/2009)    Hypertension flowsheet reviewed?: Yes   Progress toward BP goal: At goal  Self-Management Support :   Personal Goals (by the next clinic visit) :      Personal blood pressure goal: 140/90  (10/21/2009)   Patient will work on the following items until the next clinic visit to reach self-care goals:     Medications and monitoring: take my medicines every day, bring all of my medications to every visit  (10/21/2009)     Eating: eat more vegetables, use fresh or frozen vegetables, eat foods that are low in salt, eat fruit for snacks and desserts, limit or avoid alcohol  (10/21/2009)     Activity: take a 30 minute walk every day, take the stairs instead of the elevator  (10/21/2009)    Hypertension self-management support: Written self-care plan  (10/21/2009)   Hypertension self-care plan printed.

## 2010-10-20 NOTE — Assessment & Plan Note (Signed)
Summary: ACUTE-THYROID PROBLEMS(MAGICK)/CFB   Vital Signs:  Patient profile:   45 year old female Height:      65.5 inches (166.37 cm) Weight:      186.8 pounds (84.91 kg) BMI:     30.72 Temp:     97.7 degrees F (36.50 degrees C) oral Pulse rate:   85 / minute BP sitting:   125 / 87  (left arm) Cuff size:   regular  Vitals Entered By: Cynda Familia Duncan Dull) (July 01, 2010 10:09 AM) CC: Depression, check for menopause (hot flashes, night sweats), wants thyroid checked (was on meds in the past) Is Patient Diabetic? No Pain Assessment Patient in pain? no      Nutritional Status BMI of > 30 = obese  Have you ever been in a relationship where you felt threatened, hurt or afraid?No   Does patient need assistance? Functional Status Self care Ambulation Normal   Primary Care Provider:  Mliss Sax MD  CC:  Depression, check for menopause (hot flashes, night sweats), and wants thyroid checked (was on meds in the past).  History of Present Illness: C/o: 1. Hot flah; drenching sweats for 1 year. Had TAH in 2002 for DUB/fibroids. left ovary is intact. Patient is unsure why she had right oophorectomy. Patient feels depressed;  reports poor sleep; no poor appetite; no enjoyment in regular leisre activites; no SI/HI or mania. Used to drink wine "to unwind" but stopped because "made her sweat more." 2. "Px with a thyroid" since 2005. Was given medication for 2 years; off medication since 2009. Feels that "she feels not herself anymore."  Depression History:      The patient is having a depressed mood most of the day.         Preventive Screening-Counseling & Management  Alcohol-Tobacco     Alcohol drinks/day: 0     Smoking Status: quit < 6 months     Smoking Cessation Counseling: no     Packs/Day: 1/2     Year Started: QUIT FOR 3 YRS / STARTED BACK 12/08/quit 12/2007/restarted smoking     Year Quit: 12/2007     Passive Smoke Exposure: no     Tobacco Counseling: to remain off  tobacco products  Problems Prior to Update: 1)  Hot Flashes  (ICD-627.2) 2)  Headache  (ICD-784.0) 3)  Numbness  (ICD-782.0) 4)  Pruritus  (ICD-698.9) 5)  Hyperthyroidism  (ICD-242.90) 6)  Dehydration  (ICD-276.51) 7)  Dental Caries  (ICD-521.00) 8)  Hypertension  (ICD-401.9) 9)  Anxiety Disorder, Situational, Mild  (ICD-309.24) 10)  Myalgia  (ICD-729.1) 11)  Insomnia  (ICD-780.52) 12)  Hx of Grave's Disease  (ICD-242.00) 13)  Allergic Rhinitis  (ICD-477.9) 14)  Depression  (ICD-311)  Medications Prior to Update: 1)  Nexium 40 Mg Cpdr (Esomeprazole Magnesium) .... Take 1 Tablet By Mouth Once A Day 2)  Inderal La 80 Mg Xr24h-Cap (Propranolol Hcl) .... Take 1 Tablet By Mouth Once A Day 3)  Amitriptyline Hcl 50 Mg Tabs (Amitriptyline Hcl) .... Take 1 Tablet By Mouth At Bedtime 4)  Hydrochlorothiazide 25 Mg Tabs (Hydrochlorothiazide) .... Take 1 Tablet By Mouth Once A Day 5)  Topamax 25 Mg Tabs (Topiramate) .... Take 1 Tablet By Mouth At Bedtime X 1 Week, Then Take 1 Tablet By Mouth Two Times A Day  Current Medications (verified): 1)  Nexium 40 Mg Cpdr (Esomeprazole Magnesium) .... Take 1 Tablet By Mouth Once A Day 2)  Inderal La 80 Mg Xr24h-Cap (Propranolol Hcl) .... Take 1  Tablet By Mouth Once A Day 3)  Amitriptyline Hcl 50 Mg Tabs (Amitriptyline Hcl) .... Take 1 Tablet By Mouth At Bedtime 4)  Hydrochlorothiazide 25 Mg Tabs (Hydrochlorothiazide) .... Take 1 Tablet By Mouth Once A Day 5)  Topamax 25 Mg Tabs (Topiramate) .... Take 1 Tablet By Mouth At Bedtime X 1 Week, Then Take 1 Tablet By Mouth Two Times A Day  Allergies (verified): No Known Drug Allergies  Directives (verified): 1)  Full Code   Past History:  Past Medical History: Last updated: 11/14/2008 Pruritus, began 08/17/07, resolved Allergic rhinitis Depression Graves disease Hyperthyroidism Acne vulgaris Insomnia Raynaud's phenomenon Numbness of b/l upper extremities - extensive w/up negative Complex ovarian  cyst -- 02/2007 Melena-Colonoscopy by Dr. Christella Hartigan w/ tubular adenoma and ext hemorrhoids 5/09 (repeat due 5/12) Anxiety, situational  Past Surgical History: Last updated: 04/11/2007 s/p hysterectomy  Family History: Last updated: 02/09/2008 Family History of Colon Cancer:  grandfather had colon cancer  Social History: Last updated: 02/13/2009 Pays for medications out of her own pocket. Lost her job 12/08.  05/10: now works at [employer]. single, 2 children. Son got out of jail in 04/10 and is living with her. Sexually active with 1 female partner.  Risk Factors: Alcohol Use: 0 (07/01/2010) Exercise: no (10/03/2009)  Risk Factors: Smoking Status: quit < 6 months (07/01/2010) Packs/Day: 1/2 (07/01/2010) Passive Smoke Exposure: no (07/01/2010)  Review of Systems  The patient denies anorexia, fever, weight loss, weight gain, vision loss, hoarseness, chest pain, syncope, dyspnea on exertion, peripheral edema, prolonged cough, headaches, hemoptysis, abdominal pain, melena, hematochezia, severe indigestion/heartburn, hematuria, incontinence, muscle weakness, suspicious skin lesions, difficulty walking, unusual weight change, abnormal bleeding, enlarged lymph nodes, and breast masses.    Physical Exam  General:  Well-developed,well-nourished,in no acute distress; alert,appropriate and cooperative throughout examination Head:  no abnormalities observed.   Eyes:  vision grossly intact.   Ears:  R ear normal and L ear normal.   Nose:  no external deformity.   Mouth:  good dentition and pharynx pink and moist.   Neck:  supple, full ROM, and no masses.   Lungs:  normal respiratory effort and normal breath sounds.   Heart:  normal rate, regular rhythm, and no murmur.   Abdomen:  soft, non-tender, normal bowel sounds, and no hepatomegaly.   Msk:  No deformity or scoliosis noted of thoracic or lumbar spine.   Extremities:  No clubbing, cyanosis, edema, or deformity noted with normal full range  of motion of all joints.   Neurologic:  No cranial nerve deficits noted. Station and gait are normal. Plantar reflexes are down-going bilaterally. DTRs are symmetrical throughout. Sensory, motor and coordinative functions appear intact. Skin:  Intact without suspicious lesions or rashes Cervical Nodes:  No lymphadenopathy noted Axillary Nodes:  No palpable lymphadenopathy Psych:  Oriented X3, memory intact for recent and remote, normally interactive, good eye contact, not anxious appearing, not depressed appearing, not agitated, not suicidal, and not homicidal.     Impression & Recommendations:  Problem # 1:  HOT FLASHES (ICD-627.2) Assessment Deteriorated  Patient has been in a surgical menopause since 2005. Reports recurrence of Sx. Will check FSH and TSH. Scheduled for a mammogram. Will follow up next week.  Future Orders: T-FSH ([id number]) ... 07/07/2010  Discussed treatment options.   Problem # 2:  HYPERTHYROIDISM (ICD-242.90) Assessment: Unchanged  Hx of hyperthyroidism. Off methemazole since 2009. Will check TSH and T4 today. Needs to return for fasting lipds test. Her updated medication list for this  problem includes:    Inderal La 80 Mg Xr24h-cap (Propranolol hcl) .Marland Kitchen... Take 1 tablet by mouth once a day  Future Orders: T-TSH ([id number]) ... 07/07/2010 T-T4, Free [id number] [phone]) ... 07/07/2010  Labs Reviewed: TSH: 0.672 (06/26/2009)   Free T3: 2.8 (05/08/2009)  Problem # 3:  DEPRESSION (ICD-311) Assessment: Deteriorated  Patient denies SI/HI or mania. Declined adjustment of her antidpressant therapy for now. Will await results of TSH, LH and FSH. Will follow up next week. Her updated medication list for this problem includes:    Amitriptyline Hcl 50 Mg Tabs (Amitriptyline hcl) .Marland Kitchen... Take 1 tablet by mouth at bedtime  Discussed treatment options, including trial of antidpressant medication. Will refer to behavioral health. Follow-up call in in 24-48 hours and recheck  in 2 weeks, sooner as needed. Patient agrees to call if any worsening of symptoms or thoughts of doing harm arise. Verified that the patient has no suicidal ideation at this time.   Complete Medication List: 1)  Nexium 40 Mg Cpdr (Esomeprazole magnesium) .... Take 1 tablet by mouth once a day 2)  Inderal La 80 Mg Xr24h-cap (Propranolol hcl) .... Take 1 tablet by mouth once a day 3)  Amitriptyline Hcl 50 Mg Tabs (Amitriptyline hcl) .... Take 1 tablet by mouth at bedtime 4)  Hydrochlorothiazide 25 Mg Tabs (Hydrochlorothiazide) .... Take 1 tablet by mouth once a day  Other Orders: Mammogram (Screening) (Mammo) Future Orders: T-Lipid Profile ([id number]) ... 07/07/2010  Patient Instructions: 1)  Please,  return to clinic next tuesday for blood work (fasting). 2)  Please, call with any questions. Process Orders Check Orders Results:     [facility]: ABN not required for this insurance Tests Sent for requisitioning (July 01, 2010 11:29 AM):     07/07/2010: [facility] -- T-Lipid Profile [id number] (signed)     07/07/2010: [facility] -- T-TSH [phone] (signed)     07/07/2010: [facility] -- [place], [place] [[id number]] (signed)     07/07/2010: [facility] -- T-FSH [phone] (signed)     Prevention & Chronic Care Immunizations   Influenza vaccine: Fluvax 3+  (06/26/2009)   Influenza vaccine deferral: Refused  (07/01/2010)    Tetanus booster: Not documented   Td booster deferral: Refused  (07/01/2010)    Pneumococcal vaccine: Not documented  Other Screening   Pap smear: Not documented   Pap smear action/deferral: Not indicated S/P hysterectomy  (05/07/2009)    Mammogram: No specific mammographic evidence of malignancy.  Assessment: BIRADS 2.   (12/27/2008)   Mammogram action/deferral: Ordered  (07/01/2010)   Mammogram due: 12/2009   Smoking status: quit < 6 months   (07/01/2010)  Lipids   Total Cholesterol: 172  (02/13/2009)   Lipid panel action/deferral: Lipid Panel ordered   LDL: 101  (02/13/2009)   LDL Direct: Not documented   HDL: 51  (02/13/2009)   Triglycerides: 99  (02/13/2009)  Hypertension   Last Blood Pressure: 125 / 87  (07/01/2010)   Serum creatinine: 0.68  (10/03/2009)   Serum potassium 3.9  (10/03/2009)    Hypertension flowsheet reviewed?: Yes   Progress toward BP goal: At goal    Stage of readiness to change (hypertension management): Maintenance  Self-Management Support :   Personal Goals (by the next clinic visit) :      Personal blood pressure goal: 140/90  (10/21/2009)   Patient will work on the following items until the next clinic visit to reach self-care goals:     Medications and monitoring: take  my medicines every day  (07/01/2010)     Eating: eat foods that are low in salt, eat baked foods instead of fried foods  (07/01/2010)     Activity: take a 30 minute walk every day  (07/01/2010)    Hypertension self-management support: Written self-care plan, Resources for patients handout  (07/01/2010)   Hypertension self-care plan printed.      Resource handout printed.   Nursing Instructions: Schedule screening mammogram (see order)

## 2010-10-20 NOTE — Progress Notes (Signed)
Summary: phone/gg  Phone Note Call from Patient   Caller: Patient Summary of Call: Pt called with c/o headache.  She is using excedrine migraine with some relief, has sharp pain behind eyes.  Onset 12/22, was hospitalized for 2 days because of headaches.  She was told she had HTN causing headaches.  She was seen here 1/4 for HFU.   Also the medicine for itching is not helping.  She is taking 2 hydroxyzine with no relief. Also conserned about B -12 being low.   Pt # [id number] Initial call taken by: Merrie Roof RN,  October 02, 2009 2:20 PM  Follow-up for Phone Call        Ta;led with Dr Doreen Beam and advise to have pt re-evaluated as headaches were not present at last visit. Pt will come in tomorrow AM Follow-up by: Merrie Roof RN,  October 02, 2009 2:43 PM

## 2010-10-20 NOTE — Assessment & Plan Note (Signed)
Summary: hfu/pcp-sidhu/hla   Vital Signs:  Patient profile:   45 year old female Height:      65.5 inches Weight:      176.0 pounds BMI:     28.95 Temp:     97.8 degrees F oral Pulse rate:   79 / minute BP sitting:   127 / 87  (right arm)  Vitals Entered By: Filomena Jungling NT II (September 23, 2009 3:55 PM) CC: HFU WITH ARM PAIN, NEED REFILL ON , Depression Is Patient Diabetic? No Pain Assessment Patient in pain? yes     Location: arm pain Intensity: 9 Type: aching Onset of pain  Intermittent Nutritional Status BMI of 25 - 29 = overweight  Have you ever been in a relationship where you felt threatened, hurt or afraid?No   Does patient need assistance? Functional Status Self care Ambulation Normal   Primary Care Provider:  Mliss Sax MD  CC:  HFU WITH ARM PAIN, NEED REFILL ON , and Depression.  History of Present Illness: 45 yo woman with PMH as detailed below who presents for hospital followup. She was hospitalized 08/2009 at [facility] with a severe frontotemporal headache, high blood pressure, and numbness in her arms. The headache went away after lowering her blood pressure in the hospital and after receiving a dose of Toradol. The patient had a CT of the head that was negative.   Headache: Has only come back once and Excedrin Migraine resolved her symptoms. Otherwise has not returned and has no complaints of headache.   Numbness in arms: Numbness can be in either arm all the way from the shoulder down to the fingers. There may be some slight aching, and there can be sharp pain radiating down the whole arm down to the fingertips.  She has seen a neurologist for headaches in the past but never for her numbness.  Of note this has been worked up extensively in the past. Although her B12 has been at the lower end of normal in the past, she was treated with B12 shots for a period of time and she thinks that this helped her symptoms. Her MMA was checked most recently in 10/2008  and was just below normal [which is relevant because it was not elevated]. She had a negative MRI in 2007. She has had ESR checked that was WNL. Thyroid funtion was normal at last hospitalization. Electrolytes and Ca++ were normal at last hospitalization.   HTN:  Started HCTZ after 08/2009 hospitalization. Taking without missing doses and has no complaints.   Hx Graves: See an endocrinologist at [facility] in a few days. She has symptoms of hot flashes, sweating. Her TSH and free T4 have been normal recently. She was on methimazole for Graves and was told to stop taking methimazole given normal thyroid function tests until she is seen at [facility].    Pruritis: Patient has been seen in the past for pruritis of unclear etiology. No specific rash associated with it. Benadryl does not help. Treated with both prednisone and hydroyzine in past. She thinks it is "nerves" and anxiety that causes it.  Depression History:      The patient denies a depressed mood most of the day and a diminished interest in her usual daily activities.         Preventive Screening-Counseling & Management  Alcohol-Tobacco     Alcohol drinks/day: 0     Smoking Status: quit < 6 months     Smoking Cessation Counseling: no  Packs/Day: 1/2     Year Started: QUIT FOR 3 YRS / STARTED BACK 12/08/quit 12/2007/restarted smoking     Year Quit: 12/2007     Passive Smoke Exposure: no  Caffeine-Diet-Exercise     Does Patient Exercise: no     Type of exercise: aerobic     Times/week: 2  Allergies (verified): No Known Drug Allergies  Review of Systems  The patient denies anorexia, fever, weight loss, weight gain, vision loss, decreased hearing, hoarseness, chest pain, syncope, dyspnea on exertion, peripheral edema, prolonged cough, hemoptysis, abdominal pain, melena, hematochezia, severe indigestion/heartburn, transient blindness, and difficulty walking.         Please see HPI regarding itching, numbness in arms.   Physical  Exam  General:  alert, well-developed, well-nourished, and well-hydrated.   Head:  normocephalic and atraumatic.   Eyes:  vision grossly intact, pupils equal, pupils round, and pupils reactive to light.   Ears:  R ear normal and L ear normal.   Nose:  no external deformity.   Lungs:  normal respiratory effort and normal breath sounds.   Heart:  normal rate, regular rhythm, and no murmur.   Abdomen:  soft, non-tender, and normal bowel sounds.   Msk:  no joint tenderness and no joint swelling.   Neurologic:  alert & oriented X3, cranial nerves II-XII intact, strength normal in all extremities, and sensation intact to light touch.   Skin:  Patient has a few hyperpigmented areas on her chest. One of them is in the shape of a chest lead and she says it is from her recent hospitalization.  Psych:  slightly anxious.     Impression & Recommendations:  Problem # 1:  NUMBNESS (ICD-782.0) This has been worked up extensively in the past. Although her B12 has been at the lower end of normal in the past, she was treated with B12 shots for a period of time and she thinks that this helped her symptoms. Her MMA was checked most recently in 10/2008 and was just below normal [which is relevant because it was not elevated]. She had a negative C-spine MRI in 2007. She has had ESR checked that was WNL. Thyroid funtion was normal at last hospitalization. Electrolytes and Ca++ were normal at last hospitalization. Perhaps she needs neurology referral for more complete evaluation and possible nerve conduction studies. The patient does not have any real neuro deficits that I can see. The patient seems to have psychiatric issues, and even she mentions that she thinks some of her problems coincide with life stressors as she says that her son causes her a great deal of stress. I discussed letting the patient see the endocrinologist in a few days at [facility] as she plans to bring up this issue with him. I then asked her to return  in  ~47 month to evaluate her symptoms and to see what the endocrinologist has to offer. If she still has symptoms at that time I think a neurology referral might be indicated.   Problem # 2:  HYPERTHYROIDISM (ICD-242.90) See an endocrinologist at [facility] in a few days. She has symptoms of hot flashes, sweating. Her TSH and free T4 have been normal recently. She was on methimazole for Graves and was told to stop taking methimazole given normal thyroid function tests until she is seen at [facility]  For now she will continue not taking methimazole and we will see what her endocrinologist says in a few days.   Her updated medication list for this problem  includes:    Inderal La 80 Mg Xr24h-cap (Propranolol hcl) .Marland Kitchen... Take 1 tablet by mouth once a day  Problem # 3:  HYPERTENSION (ICD-401.9) Good control on HCTZ that was recently started. Checking Bmet today.  **Bmet WNL.  Her updated medication list for this problem includes:    Inderal La 80 Mg Xr24h-cap (Propranolol hcl) .Marland Kitchen... Take 1 tablet by mouth once a day    Hydrochlorothiazide 25 Mg Tabs (Hydrochlorothiazide) .Marland Kitchen... Take 1 tablet by mouth once a day  Orders: T-Basic Metabolic Panel [id number])  Problem # 4:  PRURITUS (ICD-698.9) The patient has a few small areas on her chest, at least one of which is shaped like a cardiac lead from her last hospitalization. I do not see any real rash anywhere else, and she itches diffusely in areas where there are no skin findings. LFTs and bili have always been normal. She does have thyroid disease, and this has been associated with pruritis, but her recent TSH and T4 were WNL. This has been treated with both prednisone and hydroxyzine in past. She thinks it is "nerves" and anxiety that causes it as she has significant life stressors according to her. She says that her son causes her stress. For now I will give another trial of hydroxyzine and see if this helps. She is already using DIal soap. I have asked  that she change her detergent to ALL Free and Clear to see if perhaps this will help.   Complete Medication List: 1)  Nexium 40 Mg Cpdr (Esomeprazole magnesium) .... Take 1 tablet by mouth once a day 2)  Inderal La 80 Mg Xr24h-cap (Propranolol hcl) .... Take 1 tablet by mouth once a day 3)  Amitriptyline Hcl 25 Mg Tabs (Amitriptyline hcl) .... Take 1 tablet by mouth at night time. 4)  Hydrochlorothiazide 25 Mg Tabs (Hydrochlorothiazide) .... Take 1 tablet by mouth once a day 5)  Hydroxyzine Hcl 25 Mg Tabs (Hydroxyzine hcl) .... Take 1 tablet by mouth once every 6 hours as needed for itching. note that it is sedating and should not be taken before driving if you feel sedated.  Patient Instructions: 1)  Please schedule a followup appointment in  ~1 month. Prescriptions: HYDROXYZINE HCL 25 MG TABS (HYDROXYZINE HCL) Take 1 tablet by mouth once every 6 hours as needed for itching. Note that it is sedating and should not be taken before driving if you feel sedated.  #25 x 0   Entered and Authorized by:   Aris Lot MD   Signed by:   Aris Lot MD on 09/23/2009   Method used:   Print then Give to Patient   RxID:   [id number] HYDROCHLOROTHIAZIDE 25 MG TABS (HYDROCHLOROTHIAZIDE) Take 1 tablet by mouth once a day  #31 x 3   Entered and Authorized by:   Aris Lot MD   Signed by:   Aris Lot MD on 09/23/2009   Method used:   Print then Give to Patient   RxID:   [id number]   Process Orders Check Orders Results:     [facility]: ABN not required for this insurance Tests Sent for requisitioning (September 24, 2009 4:53 PM):     09/23/2009: [facility] -- T-Basic Metabolic Panel [phone] (signed)

## 2010-10-20 NOTE — Assessment & Plan Note (Signed)
Summary: 2WK FU/EST/VS   Vital Signs:  Patient Profile:   45 Years Old Female Height:     65.5 inches (166.37 cm) Weight:      193.1 pounds (87.77 kg) BMI:     31.76 Temp:     97.7 degrees F (36.50 degrees C) oral Pulse rate:   87 / minute BP sitting:   129 / 87  (right arm) Cuff size:   regular  Pt. in pain?   yes    Location:   LEFT LEG    Intensity:   8    Type:       THROBING  Vitals Entered By: Theotis Barrio (September 20, 2007 11:03 AM)              Is Patient Diabetic? No Nutritional Status NORMAL  Have you ever been in a relationship where you felt threatened, hurt or afraid?No   Does patient need assistance? Functional Status Self care Ambulation Normal     Chief Complaint:  HAVING NUMBNESS IN LEFT LEG AT NIGHT- DOWN TO FOOT.  History of Present Illness: 45 yo F who presents for a f/up on some itching that she had.  She is no longer itching.  She finished the Prednisone and it worked some, but she thinks the itching stopped on it's own.  She started taking the Lyrica again due to some left leg numbness that has come back.  The numbness in her left leg starts in her buttocks and goes down the back of her leg on the left.  She also restarted the Trazadone and she got ill - she had fevers and vomited.  She thinks the illness may have been due to a bug, however, and not the Trazadone. She denies any palpatations, chest pain, hyperactivity or diarrhea.    Current Allergies: ! VICODIN ! CODEINE    Risk Factors: Tobacco use:  quit    Year quit:  2007 Passive smoke exposure:  no Drug use:  no Alcohol use:  no Exercise:  no Seatbelt use:  100 %   Review of Systems      See HPI   Physical Exam  General:     Well-developed,well-nourished,in no acute distress; alert,appropriate and cooperative throughout examination Neurologic:     sensation intact to light touch over legs. strength: left leg 4+/5, right leg 5/5.     Impression &  Recommendations:  Problem # 1:  LEG PAIN, LEFT (ICD-729.5) It is unclear at this time what is causing the numbness sensation in the distribution of the sciatic nerve.  Will check a lumbar xray and have advised that she continue to take the Lyrica if it helps her.  She had no deficits that I could determine. Orders: Diagnostic X-Ray/Fluoroscopy (Diagnostic X-Ray/Flu)   Problem # 2:  HYPERTHYROIDISM (ICD-242.90) Will check a thyroid panel and adjust meds based on the results.  She will RTC in 1 more month for f/up on her hyperT. Her updated medication list for this problem includes:    Tapazole 5 Mg Tabs (Methimazole) .Marland Kitchen... Take 1/2 tablet by mouth once a day    Toprol Xl 25 Mg Tb24 (Metoprolol succinate) .Marland Kitchen... Take 1 tablet by mouth once a day  Orders: T-TSH ([id number]) T-T3 Uptake [id number]) T-T4, Free [phone])   Complete Medication List: 1)  Tapazole 5 Mg Tabs (Methimazole) .... Take 1/2 tablet by mouth once a day 2)  Toprol Xl 25 Mg Tb24 (Metoprolol succinate) .... Take 1 tablet by mouth once a day 3)  Nexium 40 Mg Cpdr (Esomeprazole magnesium) .... Take 1 tablet by mouth once a day 4)  Flonase 50 Mcg/act Susp (Fluticasone propionate) .... Take 2 sprays in each nostril once a day 5)  Lyrica 100 Mg Caps (Pregabalin) .... Take 1 tablet by mouth two times a day 6)  Loratadine 10 Mg Tabs (Loratadine) .... Take 1 tablet by mouth once a day 7)  Trazodone Hcl 50 Mg Tabs (Trazodone hcl) .... Take 1/2 to 1 tablet by mouth at bedtime as needed. 8)  Hydroxyzine Hcl 50 Mg Tabs (Hydroxyzine hcl) .... Take 1 tablet every 6 hours as needed for itching.  do not take with alcohol or trazodone.   Patient Instructions: 1)  Please schedule a follow-up appointment in 1 month with Dr. Landis Martins for your thyroid. 2)  You will be called with the results of your thyroid studis and with instructions on any medicine changes. 3)  You will be called with any abnormalities in your  xrays.    Prescriptions: LORATADINE 10 MG  TABS (LORATADINE) Take 1 tablet by mouth once a day  #30 x 11   Entered and Authorized by:   Chauncey Reading DO   Signed by:   Chauncey Reading DO on 09/20/2007   Method used:   Electronically sent to ...       [address]*       [address]       [place], [place]  [address]       Ph: [phone]       Fax: [phone]   RxID:   [id number]  ]

## 2010-10-20 NOTE — Assessment & Plan Note (Signed)
Summary: eval headache/gg   see last note   Vital Signs:  Patient profile:   45 year old [name] Height:      65.5 inches Weight:      175.9 pounds BMI:     28.93 Temp:     97.0 degrees F oral Pulse rate:   70 / minute BP sitting:   120 / 80  (right arm)  Vitals Entered By: Filomena Jungling NT II (October 03, 2009 9:36 AM) CC: NUMBNESS AND TINGLING  IN BOTH ARTMS, HEADACHES Pain Assessment Patient in pain? yes     Location: ARMS Intensity: 6 Type: stinging Onset of pain  Constant  Does patient need assistance? Functional Status Self care Ambulation Normal   Primary Care Provider:  Mliss Sax MD  CC:  NUMBNESS AND TINGLING  IN BOTH ARTMS and HEADACHES.  History of Present Illness: 45 yr old woman comes to the clinic complaining of headaches. Patient report headaches start thursday September 25, 2008. Described as sharp pain, located behind right eye. Patient described that she feels when she is going to get headache. Has been taking excedrin. Initially it helped but currently it has not been helping. Patient reports that she has been taking excedrin 2-3 times a day since September 14, 2009.    Patient also complains of numbness on her arms and fingertips. Patient reports that in the past she was told that her vitamin b12 was low so she started to get b12shots which helped. She reports that since stopping the shots her numbness has returned.   Patient reports not to be taking amitryptiline in one month because she was told to stop taking it but does not remember which doctor told her that.   Depression History:      The patient denies a depressed mood most of the day and a diminished interest in her usual daily activities.         Preventive Screening-Counseling & Management  Alcohol-Tobacco     Alcohol drinks/day: 0     Smoking Status: quit < 6 months     Smoking Cessation Counseling: no     Packs/Day: 1/2     Year Started: QUIT FOR 3 YRS / STARTED BACK 12/08/quit  12/2007/restarted smoking     Year Quit: 12/2007     Passive Smoke Exposure: no  Caffeine-Diet-Exercise     Does Patient Exercise: no     Type of exercise: aerobic     Times/week: 2  Problems Prior to Update: 1)  Numbness  (ICD-782.0) 2)  Pruritus  (ICD-698.9) 3)  Hyperthyroidism  (ICD-242.90) 4)  Dehydration  (ICD-276.51) 5)  Dental Caries  (ICD-521.00) 6)  Hypertension  (ICD-401.9) 7)  Anxiety Disorder, Situational, Mild  (ICD-309.24) 8)  Myalgia  (ICD-729.1) 9)  Insomnia  (ICD-780.52) 10)  Hx of Grave's Disease  (ICD-242.00) 11)  Allergic Rhinitis  (ICD-477.9) 12)  Depression  (ICD-311)  Medications Prior to Update: 1)  Nexium 40 Mg Cpdr (Esomeprazole Magnesium) .... Take 1 Tablet By Mouth Once A Day 2)  Inderal La 80 Mg Xr24h-Cap (Propranolol Hcl) .... Take 1 Tablet By Mouth Once A Day 3)  Amitriptyline Hcl 25 Mg Tabs (Amitriptyline Hcl) .... Take 1 Tablet By Mouth At Night Time. 4)  Hydrochlorothiazide 25 Mg Tabs (Hydrochlorothiazide) .... Take 1 Tablet By Mouth Once A Day 5)  Hydroxyzine Hcl 25 Mg Tabs (Hydroxyzine Hcl) .... Take 1 Tablet By Mouth Once Every 6 Hours As Needed For Itching. Note That It Is Sedating and Should  Not Be Taken Before Driving If You Feel Sedated.  Current Medications (verified): 1)  Nexium 40 Mg Cpdr (Esomeprazole Magnesium) .... Take 1 Tablet By Mouth Once A Day 2)  Inderal La 80 Mg Xr24h-Cap (Propranolol Hcl) .... Take 1 Tablet By Mouth Once A Day 3)  Amitriptyline Hcl 25 Mg Tabs (Amitriptyline Hcl) .... Take 1 Tablet By Mouth At Night Time. 4)  Hydrochlorothiazide 25 Mg Tabs (Hydrochlorothiazide) .... Take 1 Tablet By Mouth Once A Day  Allergies: No Known Drug Allergies  Past History:  Past Medical History: Last updated: 11/14/2008 Pruritus, began 08/17/07, resolved Allergic rhinitis Depression Graves disease Hyperthyroidism Acne vulgaris Insomnia Raynaud's phenomenon Numbness of b/l upper extremities - extensive w/up  negative Complex ovarian cyst -- 02/2007 Melena-Colonoscopy by Dr. Christella Hartigan w/ tubular adenoma and ext hemorrhoids 5/09 (repeat due 5/12) Anxiety, situational  Past Surgical History: Last updated: 04/11/2007 s/p hysterectomy  Family History: Last updated: 02/09/2008 Family History of Colon Cancer:  grandfather had colon cancer  Social History: Last updated: 02/13/2009 Pays for medications out of her own pocket. Lost her job 12/08.  05/10: now works at [employer]. single, 2 children. Son got out of jail in 04/10 and is living with her. Sexually active with 1 [name] partner.  Risk Factors: Alcohol Use: 0 (10/03/2009) Exercise: no (10/03/2009)  Risk Factors: Smoking Status: quit < 6 months (10/03/2009) Packs/Day: 1/2 (10/03/2009) Passive Smoke Exposure: no (10/03/2009)  Family History: Reviewed history from 02/09/2008 and no changes required. Family History of Colon Cancer:  grandfather had colon cancer  Social History: Reviewed history from 02/13/2009 and no changes required. Pays for medications out of her own pocket. Lost her job 12/08.  05/10: now works at [employer]. single, 2 children. Son got out of jail in 04/10 and is living with her. Sexually active with 1 [name] partner.  Review of Systems       The patient complains of headaches and muscle weakness.  The patient denies fever, chest pain, dyspnea on exertion, peripheral edema, prolonged cough, hemoptysis, abdominal pain, melena, hematochezia, hematuria, transient blindness, difficulty walking, unusual weight change, and abnormal bleeding.    Physical Exam  General:  NAD Mouth:   pharynx pink and moist, no erythema, and no exudates.    Neck:  supple.   Lungs:  normal respiratory effort and normal breath sounds.   Heart:  normal rate, regular rhythm, and no murmur.   Abdomen:  soft, non-tender, and normal bowel sounds.   Msk:  no joint tenderness and no joint swelling.  no joint warmth, no redness over joints, no joint  deformities, and no joint instability.   Extremities:  No clubbing, cyanosis, edema, or deformity noted with normal full range of motion of all joints.   Neurologic:  alert & oriented X3, cranial nerves II-XII intact, strength normal in all extremities, and sensation intact to light touch.     Impression & Recommendations:  Problem # 1:  HEADACHE (ICD-784.0) Most likely multifactorial> rebound headaches, migraine headaches. Patient was instructed to discontinue daily use of excedrin. Patient will be started on topamax and amitriptyline. Will have patient start a headache diary. Will follow up in two weeks and review diary.  Her updated medication list for this problem includes:    Inderal La 80 Mg Xr24h-cap (Propranolol hcl) .Marland Kitchen... Take 1 tablet by mouth once a day  Problem # 2:  NUMBNESS (ICD-782.0) As described previously, thorough workup has been done. Will review pending labs. If normal, on follow up will referr  for nerve conduction studies.  Orders: T-Vitamin B12 ([id number]) T-Methylmalonic Acid ([id number])  Problem # 3:  HYPERTHYROIDISM (ICD-242.90) Per Endocrinologist at [facility]. Will follow up results of studies.   Her updated medication list for this problem includes:    Inderal La 80 Mg Xr24h-cap (Propranolol hcl) .Marland Kitchen... Take 1 tablet by mouth once a day  Problem # 4:  PRURITUS (ICD-698.9) Full assessment per prior note. Agree most likely anxiety related. No evidence of rash on physical exam. Patient did not respond to hydroxyzine. Will have her use benadryl as needed.  Problem # 5:  HYPERTENSION (ICD-401.9) Controlled. Continue current regimen.  Her updated medication list for this problem includes:    Inderal La 80 Mg Xr24h-cap (Propranolol hcl) .Marland Kitchen... Take 1 tablet by mouth once a day    Hydrochlorothiazide 25 Mg Tabs (Hydrochlorothiazide) .Marland Kitchen... Take 1 tablet by mouth once a day  Orders: T-Basic Metabolic Panel [id number])  BP today: 120/80 Prior BP: 127/87  (09/23/2009)  Labs Reviewed: K+: 4.0 (09/23/2009) Creat: : 0.72 (09/23/2009)   Chol: 172 (02/13/2009)   HDL: 51 (02/13/2009)   LDL: 101 (02/13/2009)   TG: 99 (02/13/2009)  Complete Medication List: 1)  Nexium 40 Mg Cpdr (Esomeprazole magnesium) .... Take 1 tablet by mouth once a day 2)  Inderal La 80 Mg Xr24h-cap (Propranolol hcl) .... Take 1 tablet by mouth once a day 3)  Amitriptyline Hcl 50 Mg Tabs (Amitriptyline hcl) .... Take 1 tablet by mouth at bedtime 4)  Hydrochlorothiazide 25 Mg Tabs (Hydrochlorothiazide) .... Take 1 tablet by mouth once a day 5)  Topamax 25 Mg Tabs (Topiramate) .... Take 1 tablet by mouth at bedtime x 1 week, then take 1 tablet by mouth two times a day  Patient Instructions: 1)  Please schedule a follow-up appointment in 2 weeks. 2)  Remember start taking amitryptiline and topamax as directed. 3)  Stop taking excedrin. If you need to, take Ibuprofen for headache only for breakthrough pain.  4)  Start a migraine diary and bring it with you during the next appointment.  5)  Take benadryl for itching. 6)  You will be called with any abnormalities in the tests scheduled or performed today.  If you don't hear from [place] within a week from when the test was performed, you can assume that your test was normal.  Prescriptions: TOPAMAX 25 MG TABS (TOPIRAMATE) Take 1 tablet by mouth at bedtime x 1 week, then Take 1 tablet by mouth two times a day  #30 x 3   Entered and Authorized by:   Laren Everts MD   Signed by:   Laren Everts MD on 10/03/2009   Method used:   Print then Give to Patient   RxID:   [id number] AMITRIPTYLINE HCL 50 MG TABS (AMITRIPTYLINE HCL) Take 1 tablet by mouth at bedtime  #30 x 3   Entered and Authorized by:   Laren Everts MD   Signed by:   Laren Everts MD on 10/03/2009   Method used:   Print then Give to Patient   RxID:   [id number]   Process Orders Check Orders Results:     [facility]: ABN not required for this insurance Tests Sent for requisitioning (October 07, 2009 11:32 PM):     10/03/2009: [facility] -- T-Vitamin B12 [[id number]] (signed)     10/03/2009: [facility] -- T-Methylmalonic Acid [[id number]] (signed)     10/03/2009: [facility] -- T-Basic Metabolic Panel [id number] (signed)

## 2010-10-20 NOTE — Progress Notes (Signed)
Summary: med refill/gp  Phone Note Refill Request Message from:  Fax from Pharmacy on January 06, 2010 10:19 AM  Refills Requested: Medication #1:  NEXIUM 40 MG CPDR Take 1 tablet by mouth once a day   Last Refilled: 10/07/2009  Method Requested: Telephone to Pharmacy Initial call taken by: Chinita Pester RN,  January 06, 2010 10:19 AM  Follow-up for Phone Call        completed refill, thank you Jaydan Meidinger  Follow-up by: Mliss Sax MD,  January 06, 2010 10:56 AM    Prescriptions: NEXIUM 40 MG CPDR (ESOMEPRAZOLE MAGNESIUM) Take 1 tablet by mouth once a day  #90 x 3   Entered and Authorized by:   Mliss Sax MD   Signed by:   Mliss Sax MD on 01/06/2010   Method used:   Faxed to ...       [facility] Department (retail)       [address] [place], [place]  [address]       Ph: [phone]       Fax: [phone]   RxID:   [id number]

## 2010-10-20 NOTE — Progress Notes (Signed)
Summary: thyroid//kg  Phone Note Outgoing Call   Call placed by: Cynda Familia Duncan Dull),  July 28, 2010 10:02 AM Call placed to: Patient Summary of Call: Call was placed to pt yesterday to inform her that she will have to be referred through [facility] 2/2 lack of insurance.  Pt understands that there may be a waiting period to obtain an appointment.  She wants to know if MD could write a rx for "something" in the meantime to help her, especially with the "flashes".  Will foward to md fo review.Cynda Familia [facility])  July 28, 2010 10:04 AM   Follow-up for Phone Call        Provider notified. Patient may pick up Rx at her pharmacy. Thank you. Follow-up by: Deatra Robinson MD,  August 01, 2010 1:12 PM    New/Updated Medications: METHIMAZOLE 5 MG TABS (METHIMAZOLE) Take one tablet by mouth daily Prescriptions: METHIMAZOLE 5 MG TABS (METHIMAZOLE) Take one tablet by mouth daily  #30 x 0   Entered and Authorized by:   Deatra Robinson MD   Signed by:   Deatra Robinson MD on 08/01/2010   Method used:   Faxed to ...       [facility] Department (retail)       [address] [place], [place]  [address]       Ph: [phone]       Fax: [phone]   RxID:   [id number]

## 2010-10-20 NOTE — Progress Notes (Signed)
Summary: Refill/gh  Phone Note Refill Request Message from:  Fax from Pharmacy on June 08, 2010 3:47 PM  Refills Requested: Medication #1:  AMITRIPTYLINE HCL 50 MG TABS Take 1 tablet by mouth at bedtime   Last Refilled: 04/27/2010  Method Requested: Electronic Initial call taken by: Angelina Ok RN,  June 08, 2010 3:47 PM  Follow-up for Phone Call        completed refill, thank you Iskra  Follow-up by: Mliss Sax MD,  June 08, 2010 8:13 PM    Prescriptions: AMITRIPTYLINE HCL 50 MG TABS (AMITRIPTYLINE HCL) Take 1 tablet by mouth at bedtime  #30 x 7   Entered and Authorized by:   Mliss Sax MD   Signed by:   Mliss Sax MD on 06/08/2010   Method used:   Faxed to ...       [facility] Department (retail)       [address] [place], [place]  [address]       Ph: [phone]       Fax: [phone]   RxID:   [id number]

## 2010-10-20 NOTE — Progress Notes (Signed)
Summary: Refill/gh  Phone Note Refill Request Message from:  Fax from Pharmacy on December 23, 2009 12:10 PM  Refills Requested: Medication #1:  AMITRIPTYLINE HCL 50 MG TABS Take 1 tablet by mouth at bedtime   Last Refilled: 12/22/2009  Method Requested: Fax to Local Pharmacy Initial call taken by: Angelina Ok RN,  December 23, 2009 12:11 PM  Follow-up for Phone Call        completed Follow-up by: Mliss Sax MD,  December 23, 2009 12:19 PM    Prescriptions: AMITRIPTYLINE HCL 50 MG TABS (AMITRIPTYLINE HCL) Take 1 tablet by mouth at bedtime  #30 x 3   Entered and Authorized by:   Mliss Sax MD   Signed by:   Mliss Sax MD on 12/23/2009   Method used:   Faxed to ...       [facility] Department (retail)       [address] [place], [place]  [address]       Ph: [phone]       Fax: [phone]   RxID:   [id number]

## 2010-10-20 NOTE — Progress Notes (Signed)
Summary: refill/gg  Phone Note Refill Request  on June 10, 2010 11:42 AM  Refills Requested: Medication #1:  INDERAL LA 80 MG XR24H-CAP Take 1 tablet by mouth once a day Pt has changed pharmacy   Method Requested: Electronic Initial call taken by: Merrie Roof RN,  June 10, 2010 11:42 AM  Follow-up for Phone Call        completed refill, thank you Iskra  Follow-up by: Mliss Sax MD,  June 11, 2010 12:42 AM    Prescriptions: INDERAL LA 80 MG XR24H-CAP (PROPRANOLOL HCL) Take 1 tablet by mouth once a day  #30 Capsule x 5   Entered and Authorized by:   Mliss Sax MD   Signed by:   Mliss Sax MD on 06/11/2010   Method used:   Electronically to        [facility] [address] (retail)       [address]       [place], [place]  [id number]       Ph: [phone]       Fax: [phone]   RxID:   [id number]

## 2010-10-20 NOTE — Progress Notes (Signed)
Summary: refill/ hla  Phone Note Refill Request Message from:  Patient on Feb 13, 2010 12:27 PM  Refills Requested: Medication #1:  HYDROCHLOROTHIAZIDE 25 MG TABS Take 1 tablet by mouth once a day   Dosage confirmed as above?Dosage Confirmed last visit 10/2009 and last labs 09/2009  Initial call taken by: Marin Roberts RN,  Feb 13, 2010 12:27 PM  Follow-up for Phone Call        completed refill, thank you [place] Katich  Follow-up by: Mliss Sax MD,  Feb 15, 2010 6:15 PM    Prescriptions: HYDROCHLOROTHIAZIDE 25 MG TABS (HYDROCHLOROTHIAZIDE) Take 1 tablet by mouth once a day  #31 x 11   Entered and Authorized by:   Mliss Sax MD   Signed by:   Mliss Sax MD on 02/15/2010   Method used:   Electronically to        [facility] [address] (retail)       [address]       [place], [place]  [id number]       Ph: [phone]       Fax: [phone]   RxID:   [id number]

## 2010-10-22 ENCOUNTER — Encounter: Payer: Self-pay | Admitting: Internal Medicine

## 2010-11-08 ENCOUNTER — Encounter: Payer: Self-pay | Admitting: Internal Medicine

## 2010-11-16 ENCOUNTER — Other Ambulatory Visit: Payer: Self-pay | Admitting: *Deleted

## 2010-11-17 MED ORDER — ESOMEPRAZOLE MAGNESIUM 40 MG PO CPDR: 40.0000 mg | DELAYED_RELEASE_CAPSULE | Freq: Every day | ORAL | Status: DC

## 2010-11-30 LAB — COMPREHENSIVE METABOLIC PANEL
Alkaline Phosphatase: 73 U/L (ref 39–117)
BUN: 7 mg/dL (ref 6–23)
GFR calc non Af Amer: >60 mL/min (ref >60)
Glucose, Bld: 94 mg/dL (ref 70–99)
Potassium: 2.9 mEq/L — ABNORMAL LOW (ref 3.5–5.1)
Total Bilirubin: 0.7 mg/dL (ref 0.3–1.2)
Total Protein: 6.9 g/dL (ref 6.0–8.3)

## 2010-11-30 LAB — DIFFERENTIAL
Basophils Absolute: 0 10*3/uL (ref 0.0–0.1)
Basophils Relative: 0 % (ref 0–1)
Monocytes Relative: 5 % (ref 3–12)
Neutro Abs: 5.5 10*3/uL (ref 1.7–7.7)
Neutrophils Relative %: 62 % (ref 43–77)

## 2010-11-30 LAB — URINALYSIS, ROUTINE W REFLEX MICROSCOPIC
Bilirubin Urine: NEGATIVE
Ketones, ur: NEGATIVE mg/dL
Nitrite: NEGATIVE
Specific Gravity, Urine: 1.024 (ref 1.005–1.030)
Urobilinogen, UA: 0.2 mg/dL (ref 0.0–1.0)
pH: 7.5 (ref 5.0–8.0)

## 2010-11-30 LAB — CBC
HCT: 37 % (ref 36.0–46.0)
MCV: 90 fL (ref 78.0–100.0)
RDW: 12.7 % (ref 11.5–15.5)
WBC: 8.9 10*3/uL (ref 4.0–10.5)

## 2010-12-16 ENCOUNTER — Ambulatory Visit: Payer: Self-pay | Admitting: Internal Medicine

## 2010-12-21 LAB — DIFFERENTIAL
Lymphocytes Relative: 35 % (ref 12–46)
Lymphs Abs: 2.6 10*3/uL (ref 0.7–4.0)
Monocytes Absolute: 0.4 10*3/uL (ref 0.1–1.0)
Monocytes Relative: 6 % (ref 3–12)
Neutro Abs: 4.2 10*3/uL (ref 1.7–7.7)

## 2010-12-21 LAB — CBC
HCT: 38.4 % (ref 36.0–46.0)
Hemoglobin: 12.5 g/dL (ref 12.0–15.0)
Hemoglobin: 13.1 g/dL (ref 12.0–15.0)
MCV: 91.6 fL (ref 78.0–100.0)
Platelets: 249 10*3/uL (ref 150–400)
RBC: 3.98 MIL/uL (ref 3.87–5.11)
RBC: 4.19 MIL/uL (ref 3.87–5.11)
WBC: 7.4 10*3/uL (ref 4.0–10.5)
WBC: 9.6 10*3/uL (ref 4.0–10.5)

## 2010-12-21 LAB — SEDIMENTATION RATE: Sed Rate: 20 mm/hr (ref 0–22)

## 2010-12-21 LAB — URINE CULTURE

## 2010-12-21 LAB — BASIC METABOLIC PANEL
BUN: 6 mg/dL (ref 6–23)
Chloride: 103 mEq/L (ref 96–112)
GFR calc Af Amer: >60 mL/min (ref >60)
GFR calc non Af Amer: >60 mL/min (ref >60)
Potassium: 3.6 mEq/L (ref 3.5–5.1)
Sodium: 136 mEq/L (ref 135–145)

## 2010-12-21 LAB — URINALYSIS, ROUTINE W REFLEX MICROSCOPIC
Hgb urine dipstick: NEGATIVE
Ketones, ur: NEGATIVE mg/dL
Protein, ur: NEGATIVE mg/dL
Urobilinogen, UA: 0.2 mg/dL (ref 0.0–1.0)

## 2010-12-21 LAB — POCT CARDIAC MARKERS
CKMB, poc: <1 ng/mL — ABNORMAL LOW (ref 1.0–8.0)
Troponin i, poc: <0.05 ng/mL (ref 0.00–0.09)

## 2010-12-21 LAB — POCT I-STAT, CHEM 8
BUN: 10 mg/dL (ref 6–23)
Calcium, Ion: 1.17 mmol/L (ref 1.12–1.32)
Chloride: 103 mEq/L (ref 96–112)
Glucose, Bld: 83 mg/dL (ref 70–99)
Potassium: 3.5 mEq/L (ref 3.5–5.1)

## 2010-12-21 LAB — RAPID URINE DRUG SCREEN, HOSP PERFORMED
Benzodiazepines: NOT DETECTED
Cocaine: NOT DETECTED
Opiates: NOT DETECTED

## 2010-12-21 LAB — ETHANOL: Alcohol, Ethyl (B): <5 mg/dL (ref 0–10)

## 2010-12-21 LAB — TSH: TSH: 0.399 u[IU]/mL (ref 0.350–4.500)

## 2010-12-21 LAB — T4, FREE: Free T4: 1.22 ng/dL (ref 0.80–1.80)

## 2010-12-31 ENCOUNTER — Other Ambulatory Visit: Payer: Self-pay | Admitting: Internal Medicine

## 2011-01-04 LAB — URINALYSIS, ROUTINE W REFLEX MICROSCOPIC
Bilirubin Urine: NEGATIVE
Glucose, UA: NEGATIVE mg/dL
Hgb urine dipstick: NEGATIVE
Ketones, ur: NEGATIVE mg/dL
Nitrite: NEGATIVE
Specific Gravity, Urine: 1.025 (ref 1.005–1.030)
pH: 5.5 (ref 5.0–8.0)

## 2011-01-04 LAB — WET PREP, GENITAL: Trich, Wet Prep: NONE SEEN

## 2011-01-14 ENCOUNTER — Telehealth: Payer: Self-pay | Admitting: *Deleted

## 2011-01-14 NOTE — Telephone Encounter (Signed)
Pt calls requesting thyroid medication, looking back i do see in the nov 2011 visit pt was prescribed methamazole and given 3 refills, it was not added to her medication list at that time, pt states she will keep her appt 5/8 but is out of her medication, i stressed the importance of coming for appts and having labwork done to insure correct medication and amts, she voiced understanding and requests a note be sent to md

## 2011-01-20 NOTE — Telephone Encounter (Signed)
Amy [name] I do not see that medication on her active med list so I would like to see her in clinic prior to adding medication. I agree with you Thank you Ravneet Spilker

## 2011-01-26 ENCOUNTER — Encounter: Payer: Self-pay | Admitting: Internal Medicine

## 2011-01-26 ENCOUNTER — Ambulatory Visit (INDEPENDENT_AMBULATORY_CARE_PROVIDER_SITE_OTHER): Payer: Self-pay | Admitting: Internal Medicine

## 2011-01-26 VITALS — BP 117/80 | HR 88 | Temp 98.0°F | Ht 65.0 in | Wt 175.2 lb

## 2011-01-26 DIAGNOSIS — E0590 Thyrotoxicosis, unspecified without thyrotoxic crisis or storm: Secondary | ICD-10-CM

## 2011-01-26 DIAGNOSIS — E876 Hypokalemia: Secondary | ICD-10-CM

## 2011-01-26 DIAGNOSIS — I10 Essential (primary) hypertension: Secondary | ICD-10-CM

## 2011-01-26 LAB — BASIC METABOLIC PANEL
BUN: 10 mg/dL (ref 6–23)
Calcium: 9.7 mg/dL (ref 8.4–10.5)
Chloride: 100 mEq/L (ref 96–112)
Creat: 0.65 mg/dL (ref 0.40–1.20)

## 2011-01-26 LAB — T4: T4, Total: 7.5 ug/dL (ref 5.0–12.5)

## 2011-01-26 LAB — T3, FREE: T3, Free: 2.9 pg/mL (ref 2.3–4.2)

## 2011-01-26 MED ORDER — HYDROCHLOROTHIAZIDE 25 MG PO TABS: 25.0000 mg | ORAL_TABLET | Freq: Every day | ORAL | Status: DC

## 2011-01-26 MED ORDER — PROPRANOLOL HCL ER 80 MG PO CP24: 80.0000 mg | ORAL_CAPSULE | Freq: Every day | ORAL | Status: DC

## 2011-01-26 MED ORDER — DIPHENHYDRAMINE HCL 25 MG PO CAPS: 25.0000 mg | ORAL_CAPSULE | ORAL | Status: DC | PRN

## 2011-01-26 NOTE — Assessment & Plan Note (Signed)
Well-controlled. Continue same medication regimen.

## 2011-01-26 NOTE — Assessment & Plan Note (Signed)
Will check the mammogram we'll readjust medication regimen in terms of potassium chloride supplementation if indicated.

## 2011-01-26 NOTE — Patient Instructions (Signed)
We will call you once the TSH results are back. We will not initiate the medication methimazole until we have the test results back.

## 2011-01-26 NOTE — Progress Notes (Signed)
  Subjective:    Patient ID: Amy [name], female    DOB: [birth year], 44 y.o.   MRN: [id number]  HPI Patient is 45 year old female with medical history outlined below who presents to clinic for regular followup and would like refill on her methimazole medication. She reports history of hyperthyroidism and was on methimazole in the past however she was denied refill and was requested to come to clinic for blood work prior to beginning additional refill. She reports feeling well and had no recent history of hospitalizations or significance. She denies chest pain, shortness of breath, abdominal or urinary concerns. She also denies fevers and chills, other systemic symptoms of weight loss or night sweats. She also needs refill on the rest of her medications.   Review of Systems Per history of present illness    Objective:   Physical Exam Constitutional: Vital signs reviewed.  Patient is a well-developed and well-nourished in no acute distress and cooperative with exam. Alert and oriented x3.  Eyes: PERRL, EOMI, conjunctivae normal, No scleral icterus.  Neck: Supple, Trachea midline normal ROM, No JVD, mass, thyromegaly present Cardiovascular: RRR, S1 normal, S2 normal, no MRG, pulses symmetric and intact bilaterally Pulmonary/Chest: CTAB, no wheezes, rales, or rhonchi Abdominal: Soft. Non-tender, non-distended, bowel sounds are normal, no masses, organomegaly, or guarding present.  Skin: Warm, dry and intact. No rash, cyanosis, or clubbing.  Psychiatric: Normal mood and affect. speech and behavior is normal. Judgment and thought content normal. Cognition and memory are normal.           Assessment & Plan:

## 2011-01-26 NOTE — Assessment & Plan Note (Signed)
Will check TSH, T3 and T4 today. I have discussed this plan with patient and told her since she is asymptomatic at this time we will need to have the TSH levels prior to initiating the treatment.

## 2011-02-02 NOTE — Discharge Summary (Signed)
NAMEARIS, MOMAN NO.:  [id number]   MEDICAL RECORD NO.:  [id number]          PATIENT TYPE:  OBV   LOCATION:  5501                         FACILITY:  [facility]   PHYSICIAN:  Manning Charity, MD     DATE OF BIRTH:  Nov 21, 1965   DATE OF ADMISSION:  02/24/2007  DATE OF DISCHARGE:  02/27/2007                               DISCHARGE SUMMARY   DISCHARGE DIAGNOSES:  1. Left lower quadrant pain, secondary to left-sided complex ovarian      cyst.  Ultrasound Doppler negative for signs of torsion.  2. Mild leukocytosis, secondary to problem number one.  3. Hyperthyroidism.  4. History of B12 deficiency.  5. History of peripheral neuropathy.  6. Status post hysterectomy, secondary to fibroids.  7. History of right-sided ovarian hemorrhagic cyst in 2005.  8. Past history of tobacco use, half pack per day times 26 years, quit      2 years back.  9. Significant family history of coronary artery disease.  Father      passed with myocardial infarction at 31.   DISCHARGE MEDICATIONS:  1. Phenergan 12.5 mg IV q.6 h p.r.n.  2. Protonix 40 mg p.o. daily.  3. Methimazole 10 mg p.o. daily.  4. Lyrica 100 mg p.o. t.i.d.  5. Toprol XL 100 mg p.o. daily.  6. Dilaudid 1-2 mg q.4 h IV p.r.n.  7. Toradol 30 mg q.6 h p.r.n.  8. Zofran 4 mg p.o. q.4 h IV p.r.n.  9. Ambien 5 mg p.o. q.h.s. p.r.n.   DISPOSITION:  Ms. Lavalle will be transferred to [facility], under  the care of Dr. Marcelle Overlie for further care and management of acute left  lower quadrant pain, secondary to complex ovarian cyst, for further  management.   Dictation ended at this point.      Judie Grieve, MD       Manning Charity, MD     SY/MEDQ  D:  02/27/2007  T:  02/27/2007  Job:  [id number]   cc:   Duke Salvia. Marcelle Overlie, M.D.  Dr. Liliane Channel  Dr. Janee Morn

## 2011-02-02 NOTE — Discharge Summary (Signed)
Amy, [name]                ACCOUNT NO.:  [id number]   MEDICAL RECORD NO.:  [id number]          PATIENT TYPE:  OBV   LOCATION:  5501                         FACILITY:  [facility]   PHYSICIAN:  Judie Grieve, MD  DATE OF BIRTH:  07-15-1966   DATE OF ADMISSION:  02/24/2007  DATE OF DISCHARGE:  02/27/2007                               DISCHARGE SUMMARY   ATTENDING PHYSICIAN:  Dr. Manning Charity.   DISCHARGE DIAGNOSES:  1. Left lower quadrant abdominal pain secondary to adnexal mass with      complex ovarian cyst, and no evidence of torsion per Doppler      ultrasound.  2. Mild leukocytosis secondary to demargination secondary to left      lower quadrant abdominal pain secondary to adnexal mass with      complex ovarian cyst, and no evidence of torsion per Doppler      ultrasound.  3. History of hyperthyroidism.  4. History of peripheral neuropathy.  5. History of B12 deficiency.  6. History of right ovarian hemorrhagic ovarian cyst in 2005.  7. History of tobacco abuse, quit 2 years ago.  8. Status post hysterectomy secondary to fibroids.   DISCHARGE MEDICATIONS:  1. Phenergan 12.5 mg IV q.6h. p.r.n.  2. Zofran 4 mg IV q.6h. p.r.n.  3. Protonix 40 mg p.o. daily.  4. Tapazole or methimazole 10 mg p.o. daily.  5. Lyrica 100 mg p.o. t.i.d.  6. Toprol XL 100 mg p.o. daily.  7. Dilaudid 1-2 mg IV q.4h. p.r.n. pain.  8. Toradol 30 mg IV q.6h. p.r.n. pain.  9. PAS hose for DVT prophylaxis.   DISPOSITION:  The patient has been transferred to [facility], bed  309, and will be further managed there under the care of Dr. Marcelle Overlie for  acute onset of left lower quadrant abdominal pain in the setting of left-  sided, complex ovarian cyst.   CONSULTATIONS:  Gynecology by Dr. Estanislado Pandy .   PROCEDURES:  1. Imaging.  CT of the abdomen and pelvis with p.o. contrast done on      February 24, 2007 showed right pelvic kidney.  No evidence of acute      abdominal abnormality.  Appendix  looks within normal limits, and      left adnexal mass likely represents enlarged left ovary. Suggest      further evaluation with ultrasound to determine if this could      represent physiologic  cyst or maybe a solid component.  2. Followup ultrasound, transvaginal, as well as ultrasound of the      pelvis on February 25, 2007 showed surgically absent uterus, complex      left adnexal mass which would be suspicious for neoplasm if there      is no spontaneous resolution.  The right ovary has a normal      appearance, and has measurements of 2.6 x 1.7 x 1.8.  Left ovary      contains multiple complex cysts, largest is 4.3 x 3.4 x 3.3 cm.      Size is 6 x 4 x 4.5 cm.  Neoplasm could not be excluded.  Further      ultrasound required for followup in 6-8 weeks.  3. Doppler ultrasound of the pelvis and transvaginal pelvic ultrasound      done on February 25, 2007, and showed no evidence of ovarian torsion,      complex cystic lesion in the left adnexa, small amount of free      fluid status post hysterectomy.   HISTORY OF PRESENT ILLNESS:  Amy [name] is a 45 year old woman with a  past history significant for hyperthyroidism and hysterectomy as well as  a history of an ovarian cyst on the right in 2005 who presented to  outpatient clinic at [facility] internal medicine department  with complaints of left lower quadrant abdominal pain.  Her pain started  last p.m., prior to admission, in the left lower quadrant of her  abdomen, and radiated to her left lower back and suprapubic area.  Her  pain comes and goes in waves.  There are no aggravating factors, but a  heating pad made her feel better.  She did not sleep very well last p.m.  prior to admission, and went to work in the a.m., and had progression of  symptoms, and decided to call [facility] and be seen.  She was evaluated by Dr.  Liliane Channel in [facility] and sent for a CT of the abdomen which revealed a large  ovarian cyst and some free fluid in the pelvic  area, and further  admitted to the [facility] for intractable pain and further  pain control.   PHYSICAL EXAMINATION:  VITAL SIGNS:  At the time of admission:  Temperature 99.3, blood pressure 117/83, pulse 102, respiratory rate of  20, O2 sat 93% on room air, weight 89.9 kg.  GENERAL:  She was alert, awake, oriented x 3, mild acute distress  secondary to pain in legs.  EYES:  Extraocular movements intact.  PERRLA.  Anicteric.  ENT:  Oropharynx is clear.  Moist mucous membranes.  Good dentition.  NECK:  Supple.  No lymphadenopathy.  No thyromegaly.  No JVD.  RESPIRATORY:  Clear lungs with good air movement.  CVS:  S1 and S2 Regular  tachycardic.  GI:  Soft abdomen with significant tenderness to palpation in the left  lower quadrant and suprapubic area.  No guarding.  Positive local  rebound tenderness in the left lower quadrant only.  No masses.  Decreased bowel sounds slightly.  EXTREMITIES:  Trace bilateral pitting edema.  Positive CVA tenderness on  the left side.  SKIN:  Warm, and dry.  No lymphadenopathy.  MUSCULOSKELETAL:  Within normal limits.  NEUROLOGICAL:  Nonfocal.  PSYCHIATRIC:  Appropriate.   ADMISSION LABORATORIES:  CBC with a hemoglobin of 12.7, white count  14,700 with ANC of 10.1, platelet count 287,000.  BMet with sodium of  135, potassium 3.8, chloride 100, bicarb 29, BUN 5, creatinine 0.73, and  glucose of 89.  Her liver function tests showed a slightly elevated alk  phos at 130, AST 18, ALT 12, protein 7.4, albumin 3.8, calcium 9.4,  bilirubin slightly elevated at 1.2.  UDS negative.  Lipase  19.  UA was  negative.   HOSPITAL COURSE:  1. Left lower quadrant pain secondary to complex ovarian cystic lesion      initially seen on a CT of the abdomen.  Further confirmed by      followup ultrasound, and no evidence of torsion per Doppler      ultrasound of the  pelvis.  Given the significant size and the     location of the cyst, her pain was considered  likely secondary to      this cystic lesion.  At the time of admission, other differential      diagnoses included possible diverticulitis versus cystitis versus      pyelonephritis versus appendicitis and also versus nephrolithiasis,      but since the CT of the abdomen and pelvis was negative for any      signs of these above diagnoses, it was felt that her problems were      likely secondary to ovarian cyst.  At the time of her admission,      given her significant pain, she was started on Dilaudid IV p.r.n.      with only partial relief of the pain. On discussing  with      gynecologist, Dr. Estanislado Pandy we also did  Doppler studies of the pelvis      to rule out possible torsion, and these were done, and were      negative for any signs of that, and we also continued her pain      control with Dilaudid, and also added Toradol 30 mg IV q.6h. which      also did not help significantly with the pain.  For this reason, we      called the gynecologist on call at the [facility], Dr.      Marcelle Overlie, who was gracious to accept transfer of Ms. Kochel to the      [facility] for further management of her pain secondary to      this ovarian cyst as well as decide if she needs any intervention      further in the setting of ongoing pain.  Given the complex nature      of this cyst, we were also concerned about possible malignancy.      Although, ideally, now the best seeming test we did go ahead and      did a CA-125 level which was normal at 4.4 as well as a CEA level      which was also normal at less than 0.5.  This needs to be further      followed up as an outpatient.  Patient has also been made a      followup appointment with [facility]      gynecology oncology service as an outpatient, and this will be      arranged further during her hospital followup at the outpatient      clinic post her discharge.  2. Leukocytosis.  On admission, her white count was  elevated at 14,700      which further trended down to 12.0 to 10.5 at the time of transfer,      and she remained afebrile throughout her hospitalization, and these      minimally elevated leukocytes were likely secondary to      demargination from her primary problem.  We did check blood      cultures which were negative until date and also UA and urine      culture were negative as well, and the CT of the abdomen and pelvis      did not show any evidence of possible intraabdominal or pelvic      infection source.  3. History of hyperthyroidism.  TSH during this hospitalization was      within normal  limits at 3.894, and she was placed on her home      dosage of methimazole at 10 mg daily, and will be discharged on the      same at 10 mg daily.  4. History of peripheral neuropathy.  She will be continued on Lyrica      100 mg p.o. t.i.d.   DISCHARGE LABORATORIES:  At the time of discharge, her WBC count 10,500, hemoglobin 11.2 and platelet count 258,000.  Bmet with  sodium 140,  potassium 3.8, chloride 105, bicarb 28, BUN 4, creatinine 0.74 and a  glucose of 103.   DISCHARGE VITAL SIGNS:  At the time of Discharge, her vital signs were  stable.  Temperature 98.4, pulse 82, blood pressure 125/68, and she was  saturating at 97% on room air with a respiratory rate of 20.      Judie Grieve, MD  Electronically Signed     SY/MEDQ  D:  02/27/2007  T:  02/27/2007  Job:  [id number]   cc:   Ramiro Harvest, MD  D[facility] Salvia. Marcelle Overlie, M.D.

## 2011-02-02 NOTE — Group Therapy Note (Signed)
NAMEMERELYN, KLUMP NO.:  [id number]   MEDICAL RECORD NO.:  [id number]          PATIENT TYPE:  WOC   LOCATION:  [facility]                   FACILITY:  [facility]   PHYSICIAN:  Ginger Carne, MD DATE OF BIRTH:  [birth year]   DATE OF SERVICE:  03/22/2007                                  CLINIC NOTE   HISTORY:  This patient is a 45 year old African American female who  presents with left lower quadrant pain and a pelvic sonogram revealing a  complex pelvic mass on ultrasound obtained on February 25, 2007 measuring 4 x  6 x 4 cm.  The patient has had a hysterectomy and the right ovary was  normal.  In the past 2 weeks, she has also had treatment for sinusitis  and has developed a vaginal pruritus.  The remainder of her medical  information and medication list per chart.   PHYSICAL FINDINGS:  PELVIC:  External genitalia, vulva and vagina reveal  clumpy white discharge of the vagina consistent with Monilia.  Uterus  absent, cuff tender, bilateral tenderness, difficult to evaluate adnexa.   PLAN:  The patient will be given Terazol vaginal cream 1 application at  night for 3 nights and a pelvic sonogram followup to evaluate her cyst  and then 2-week followup for results.           ______________________________  Ginger Carne, MD     SHB/MEDQ  D:  03/22/2007  T:  03/23/2007  Job:  [id number]

## 2011-02-05 NOTE — Discharge Summary (Signed)
[facility]  Patient:    Amy [name], Amy [name] Visit Number: [id number] MRN: [id number]          Service Type: GYN Location: 9300 [id number] Attending Physician:  Venita Sheffield Dictated by:   Kathreen Cosier, M.D. Admit Date:  02/07/2002 Discharge Date: 02/11/2002                             Discharge Summary  HISTORY AND HOSPITAL COURSE:      The patient is a 45 year old gravida 3, para 2-0-1-2, with a 2-year history of lower abdominal pain worse with her periods. She was treated with Depo-Provera in the past but the problem was not resolved.  She had a history of cocaine use up to 4 years ago.  She underwent a TAH because of possible adenomyosis and she did well.  On admission her hemoglobin was 12.8, platelets 236, white count 8000.  Postop hemoglobin 10.2. PT/PTT normal.  Sodium 144, potassium 3.7, chloride 104, CO2 28, glucose 91. Urine negative.  She was B positive.  Postop, the patient by day #1 started running a fever of above 101.  She was treated with ampicillin IV and then temperature by day #2 was up to 102.  She was on triple therapy - Cleocin, ampicillin and gentamicin.  She rapidly defervesced and was discharged on her fourth postoperative day, ambulatory, on a regular diet.  She was discharged home on Tylox and Cleocin.  DISCHARGE DIAGNOSIS:              Status post total abdominal hysterectomy for                                   possible adenomyosis. Dictated by:   Kathreen Cosier, M.D. Attending Physician:  Venita Sheffield DD:  03/15/02 TD:  03/16/02 Job: [id number]

## 2011-02-05 NOTE — Op Note (Signed)
[facility]  Patient:    Amy [name], Amy [name] Visit Number: [id number] MRN: [id number]          Service Type: GYN Location: 9300 [id number] Attending Physician:  Venita Sheffield Dictated by:   Kathreen Cosier, M.D. Proc. Date: 02/07/02 Admit Date:  02/07/2002                             Operative Report  PREOPERATIVE DIAGNOSES:       Adenomyosis.  SURGEON:                      Kathreen Cosier, M.D.  FIRST ASSISTANT:              Charles A. Clearance Coots, M.D.  PROCEDURE:                    Patient placed on the operating table in supine position.  Abdomen prepped and draped after general anesthesia administered. Bladder emptied with Foley catheter.  Transverse suprapubic incision made, carried down to the rectus fascia.  Fascia cleaned and incised the length of the incision.  Recti muscles retracted laterally.  Peritoneum incised longitudinally.  Uterus was normal, but boggy.  Ovaries were normal.  She had had previous tubal ligation.  Right round ligament was grasped with a Kelly clamp, cut, and suture ligated with #1 chromic.  Procedure done in similar fashion on the other side.  Using the Metzenbaum scissors the bladder flap was developed and the bladder dissected off the cervix.  The right utero-ovarian ligament grasped with a Heaney clamp, cut, suture ligated with #1 chromic. Procedure done in a similar fashion on the other side.  Uterine vessels skeletonized and double clamped with Heaney clamps on the right.  Similar process on the left side.  Straight Kocher clamps used to grasp the cardinal and uterosacral ligament on the right, cut, suture ligated with #1 chromic. Procedure done in a similar fashion on the other side.  Specimen consisting of uterus was removed from the cervicovaginal junction with Mayo scissors. Modified Richardson sutures were placed in the angles of the vagina and the vaginal vault run with interlocking suture of  #1 chromic.  The vault was left open.  The left tube was grasped with a Kelly clamp and cut and suture ligated with #1 chromic.  Procedure done in a similar fashion on the other side. Hemostasis was satisfactory.  Blood loss approximately 150 cc.  Operative site reperitonealized with 2-0 chromic.  Lap and sponge counts correct.  Abdomen closed in layers.  Peritoneum continuous suture of 0 chromic.  Fascia continuous suture of 0 Dexon.  Skin closed with subcuticular stitch of 3-0 plain.  Patient tolerated procedure well.  Taken to recovery room in good condition. Dictated by:   Kathreen Cosier, M.D. Attending Physician:  Venita Sheffield DD:  02/07/02 TD:  02/08/02 Job: [id number]

## 2011-02-16 ENCOUNTER — Emergency Department (HOSPITAL_COMMUNITY)
Admission: EM | Admit: 2011-02-16 | Discharge: 2011-02-17 | Disposition: A | Discharge status: Home / Self Care | Payer: Self-pay | Attending: Emergency Medicine | Admitting: Emergency Medicine

## 2011-02-16 DIAGNOSIS — I10 Essential (primary) hypertension: Secondary | ICD-10-CM | POA: Insufficient documentation

## 2011-02-16 DIAGNOSIS — K219 Gastro-esophageal reflux disease without esophagitis: Secondary | ICD-10-CM | POA: Insufficient documentation

## 2011-02-16 DIAGNOSIS — R21 Rash and other nonspecific skin eruption: Secondary | ICD-10-CM | POA: Insufficient documentation

## 2011-02-18 ENCOUNTER — Other Ambulatory Visit: Payer: Self-pay | Admitting: *Deleted

## 2011-02-19 MED ORDER — AMITRIPTYLINE HCL 50 MG PO TABS: 50.0000 mg | ORAL_TABLET | Freq: Every day | ORAL | Status: DC

## 2011-02-19 NOTE — Telephone Encounter (Signed)
Faxed to the [facility]. 

## 2011-02-25 ENCOUNTER — Ambulatory Visit (INDEPENDENT_AMBULATORY_CARE_PROVIDER_SITE_OTHER): Payer: Self-pay | Admitting: Internal Medicine

## 2011-02-25 ENCOUNTER — Encounter: Payer: Self-pay | Admitting: Internal Medicine

## 2011-02-25 DIAGNOSIS — I10 Essential (primary) hypertension: Secondary | ICD-10-CM

## 2011-02-25 DIAGNOSIS — F329 Major depressive disorder, single episode, unspecified: Secondary | ICD-10-CM

## 2011-02-25 DIAGNOSIS — L299 Pruritus, unspecified: Secondary | ICD-10-CM | POA: Insufficient documentation

## 2011-02-25 DIAGNOSIS — E0590 Thyrotoxicosis, unspecified without thyrotoxic crisis or storm: Secondary | ICD-10-CM

## 2011-02-25 DIAGNOSIS — F3289 Other specified depressive episodes: Secondary | ICD-10-CM

## 2011-02-25 LAB — COMPREHENSIVE METABOLIC PANEL
AST: 11 U/L (ref 0–37)
Albumin: 4.4 g/dL (ref 3.5–5.2)
BUN: 20 mg/dL (ref 6–23)
Calcium: 9.5 mg/dL (ref 8.4–10.5)
Chloride: 99 mEq/L (ref 96–112)
Glucose, Bld: 79 mg/dL (ref 70–99)
Potassium: 4.3 mEq/L (ref 3.5–5.3)
Sodium: 136 mEq/L (ref 135–145)
Total Protein: 6.7 g/dL (ref 6.0–8.3)

## 2011-02-25 LAB — CBC WITH DIFFERENTIAL/PLATELET
Basophils Relative: 0 % (ref 0–1)
Eosinophils Absolute: 0.2 10*3/uL (ref 0.0–0.7)
Hemoglobin: 14 g/dL (ref 12.0–15.0)
MCH: 30.4 pg (ref 26.0–34.0)
MCHC: 31.8 g/dL (ref 30.0–36.0)
Monocytes Relative: 7 % (ref 3–12)
Neutro Abs: 6.6 10*3/uL (ref 1.7–7.7)
Neutrophils Relative %: 51 % (ref 43–77)
Platelets: 255 10*3/uL (ref 150–400)
RBC: 4.6 MIL/uL (ref 3.87–5.11)

## 2011-02-25 MED ORDER — AMITRIPTYLINE HCL 50 MG PO TABS: 50.0000 mg | ORAL_TABLET | Freq: Every day | ORAL | Status: DC

## 2011-02-25 NOTE — Progress Notes (Signed)
Subjective:    Patient ID: Amy [name], female    DOB: 10-17-1965, 45 y.o.   MRN: [id number]  HPI Comments: Patient presents with chief complaint of itching.  3 weeks, all over the whole body.  She has not changed any detergents.  Uses all fragrance free.  She was seen in ED 5 days ago and was given 5 day prednisone taper, which has helped some.  Was associated with a facial rash at the time - she describes as "fine bumps, not red or draining".    Trouble falling asleep - for same amount of time as itching.  Recently daughter decided to move out (which is a good thing for the daughter but has saddned the patient).  She is stressedsometimes, cannot sleep, very frustrated with the itching.  She says she has some sadness, feeling of abdandonment.  She has had lost interest in some things she enjoyed doing.  Was on various medications for depression but none worked.  States she does have circular thinking and loss of concentration.  No manic episodes.  Vehemently denies SI/HI.  Denies visual or auditory hallucinations.    No fevers, chills, body aches.  No bowel or bladder complaints.  No headaches. Thinks she needs new glasses - has an ophthalmologist.  No neck pain or stiffness.  No joint swelling.  No swelling in legs, hands, or feet.    She reports compliance with her medications as listed on her medication list. Benadryl has not helped her itching.     Review of Systems  Constitutional: Negative for fever, chills and diaphoresis.  HENT: Negative for ear pain, congestion, sore throat, sneezing, trouble swallowing, neck pain and neck stiffness.   Respiratory: Negative for cough, chest tightness and shortness of breath.   Cardiovascular: Negative for chest pain, palpitations and leg swelling.  Gastrointestinal: Negative for nausea, vomiting, abdominal pain, diarrhea, constipation, blood in stool and abdominal distention.  Genitourinary: Negative for dysuria, urgency, frequency, vaginal  discharge and difficulty urinating.  Musculoskeletal: Negative for myalgias, back pain, joint swelling, arthralgias and gait problem.  Skin: Negative for rash.  Neurological: Negative for dizziness, weakness, light-headedness, numbness and headaches.  Hematological: Negative for adenopathy.  Psychiatric/Behavioral: Negative for hallucinations, behavioral problems, confusion and agitation.  All other systems reviewed and are negative.       Objective:   Physical Exam  Constitutional: She is oriented to person, place, and time. She appears well-developed and well-nourished. No distress.  HENT:  Head: Normocephalic and atraumatic.  Right Ear: External ear normal.  Left Ear: External ear normal.  Mouth/Throat: Oropharynx is clear and moist. No oropharyngeal exudate.  Eyes: Conjunctivae and EOM are normal. Pupils are equal, round, and reactive to light.       No jaundice  Neck: Normal range of motion. Neck supple. No thyromegaly present.  Cardiovascular: Normal rate, regular rhythm and intact distal pulses.   No murmur heard. Pulmonary/Chest: Effort normal and breath sounds normal. She has no wheezes.  Abdominal: Soft. Bowel sounds are normal. She exhibits no distension and no mass. There is no tenderness. There is no rebound and no guarding.  Musculoskeletal: Normal range of motion. She exhibits no edema and no tenderness.  Lymphadenopathy:    She has no cervical adenopathy.  Neurological: She is alert and oriented to person, place, and time. No cranial nerve deficit. Coordination normal.  Skin: Skin is warm and dry. No rash noted. No erythema.  Psychiatric: She has a normal mood and affect. Her behavior is  normal. Judgment and thought content normal.          Assessment & Plan:

## 2011-02-25 NOTE — Assessment & Plan Note (Signed)
Will refill the patient's amitriptyline today.

## 2011-02-25 NOTE — Assessment & Plan Note (Signed)
There is a broad differential diagnosis for the chief complaint is itching. This patient has been previously evaluated for a similar complaint. No abnormalities were found at that time. She states this time a rash was present at the beginning of the itching. After treatment with prednisone the rash has resolved. I believe that it is likely that the itching will resolve over the next few days if it was in fact associated with the rash. The patient requested a prescription of prednisone to use on an as needed basis. I explained to her that we would like to see the rash if it does recur therefore I will not prescribe her a prescription of prednisone at this time. I encouraged her to give it a few days. Meanwhile we will check a complete blood count and complete metabolic panel to be sure there are no abnormalities. Otherwise she should follow back up with [place] for her annual exam in one year or as needed.

## 2011-02-25 NOTE — Assessment & Plan Note (Signed)
Thyroid function test performed by Dr. Sharman Crate and the beginning of May 2012 were all within normal limits.

## 2011-02-25 NOTE — Patient Instructions (Signed)
Please follow-up with [place] as needed or in 1 year for check-up.  Take your medications as directed. You had labwork today - I will call you if there are any abnormal results.  If your rash returns, please call the clinic for an appointment.

## 2011-02-25 NOTE — Assessment & Plan Note (Signed)
At goal with current regimen. Will not make any changes.

## 2011-03-08 ENCOUNTER — Telehealth: Payer: Self-pay | Admitting: *Deleted

## 2011-03-08 ENCOUNTER — Ambulatory Visit: Payer: Self-pay | Admitting: Internal Medicine

## 2011-03-08 NOTE — Telephone Encounter (Signed)
Pt calls to state she found a "bump" under her R breast 6/17, it is swollen, red and painful, appr 1cm, appt given for 6/19 @ 1345

## 2011-03-17 ENCOUNTER — Other Ambulatory Visit: Payer: Self-pay | Admitting: Internal Medicine

## 2011-03-17 ENCOUNTER — Ambulatory Visit (INDEPENDENT_AMBULATORY_CARE_PROVIDER_SITE_OTHER): Payer: Self-pay | Admitting: Internal Medicine

## 2011-03-17 ENCOUNTER — Encounter: Payer: Self-pay | Admitting: Internal Medicine

## 2011-03-17 DIAGNOSIS — Z1231 Encounter for screening mammogram for malignant neoplasm of breast: Secondary | ICD-10-CM

## 2011-03-17 DIAGNOSIS — R350 Frequency of micturition: Secondary | ICD-10-CM

## 2011-03-17 DIAGNOSIS — Z299 Encounter for prophylactic measures, unspecified: Secondary | ICD-10-CM

## 2011-03-17 DIAGNOSIS — R52 Pain, unspecified: Secondary | ICD-10-CM

## 2011-03-17 DIAGNOSIS — L0292 Furuncle, unspecified: Secondary | ICD-10-CM

## 2011-03-17 LAB — URINALYSIS, ROUTINE W REFLEX MICROSCOPIC
Bilirubin Urine: NEGATIVE
Glucose, UA: NEGATIVE mg/dL
Hgb urine dipstick: NEGATIVE
Ketones, ur: NEGATIVE mg/dL
Protein, ur: NEGATIVE mg/dL
pH: 7 (ref 5.0–8.0)

## 2011-03-17 NOTE — Progress Notes (Signed)
Subjective:    Patient ID: Amy [name], female    DOB: 11/06/1965, 45 y.o.   MRN: [id number]  HPI This is a 45 year old female with a past medical history significant for depression and hypertension who presented to the clinic with right breast pain. #1 breast pain: Patient noted one week ago a lump in her right breast area. It seems to be more like a small abscess kind of thing. It's painful to touch. Denies any fevers or chills. Denies any insect bite, injury. Last mammogram in 2010 which was within normal limits. #2 pain all over the body: Patient noted she is experiencing 3-4 months pain all over the body but recently it has gotten worse. It is an aching pain. There is pain when touching the skin. There is no aggravating or alleviating factor. She did not try anything over-the-counter to relieve her pain. She is not able to sleep or function on a daily basis due to the pain. Denies any changes in medication, recent travel, fevers or chills, insect bite, stress. Patient noted that her mother and her brother has fibromyalgia and there is family history of lupus.   Review of Systems  Constitutional: Positive for fatigue. Negative for fever, chills and appetite change.  HENT: Negative for facial swelling.   Cardiovascular: Positive for leg swelling. Negative for chest pain and palpitations.  Gastrointestinal: Positive for abdominal pain and abdominal distention. Negative for nausea, diarrhea, constipation and blood in stool.  Genitourinary: Positive for dysuria, urgency and frequency.  Musculoskeletal: Positive for myalgias, back pain and arthralgias.  Neurological: Positive for headaches. Negative for dizziness and weakness.       Objective:   Physical Exam  Constitutional: She is oriented to person, place, and time. She appears well-developed and well-nourished.  HENT:  Head: Normocephalic and atraumatic.  Neck: Neck supple.  Cardiovascular: Normal rate, regular rhythm and normal  heart sounds.   Pulmonary/Chest: Effort normal and breath sounds normal. No respiratory distress. She exhibits tenderness.  Abdominal: Soft. Bowel sounds are normal. She exhibits no distension. There is tenderness. There is no rebound and no guarding.  Musculoskeletal: Normal range of motion. She exhibits tenderness. She exhibits no edema.       Right shoulder: She exhibits tenderness. She exhibits normal range of motion and no swelling.       Left shoulder: She exhibits tenderness. She exhibits normal range of motion and no swelling.       Right elbow: She exhibits normal range of motion and no swelling. tenderness found.       Left elbow: She exhibits normal range of motion and no swelling. tenderness found.       Right wrist: She exhibits tenderness. She exhibits normal range of motion and no swelling.       Left wrist: She exhibits tenderness. She exhibits normal range of motion and no swelling.       Right knee: She exhibits normal range of motion and no swelling. tenderness found.       Left knee: She exhibits normal range of motion and no swelling. tenderness found.       Right ankle: She exhibits normal range of motion and no swelling. tenderness.       Left ankle: She exhibits normal range of motion and no swelling. tenderness.  Neurological: She is alert and oriented to person, place, and time. She has normal strength. No cranial nerve deficit or sensory deficit. Gait normal.  Skin:  Assessment & Plan:

## 2011-03-17 NOTE — Assessment & Plan Note (Signed)
On the right breast area most likely boil. I advised that patient to use warm pressure in that area on a regular basis. The area needs to open up and the pus has to drain. May take some few days to completely heal. I informed her if she is experiencing fevers and chills and the area is worsening to be reevaluated in the clinic for possible incision and drainage and antibiotic therapy. But I think at this point this is not required.

## 2011-03-17 NOTE — Patient Instructions (Addendum)
1. You have boil on the breast area. In order for a bone to heal, it needs to pen so that the pus the second drain out. Sometimes, was opened and drained on the own. But most of the time it needs help. You should put warm pressure on it. You can read it clean washcloth with warm water and put it on the boil. Repeat this on a regular basis. Make sure to wash your hands after you touch the boil. Once your boil opens and drains, it should heal on its own. But it can take a few weeks for boil open, drain, and heal completely. Please call the clinic if the area is becoming bigger, it is more painful and you're experiencing fevers and chills.  2. Please excercise on a regular basis.

## 2011-03-17 NOTE — Assessment & Plan Note (Addendum)
Unclear etiology. Will evaluate for inflammatory disease especially with the family history of Lupus. Will check ESR, CRP, ANA,ANA. Unlikely myositis since no signs or symptoms of muscle weakness. There is concern for fibromyalgia per patient. I informed her that we will await the lab results. She is already taking amitriptyline for depression which is a good drug for pain in fibromyalgia. I advised the patient to exercise since studies have shown improved pain relieve, decreased fatigue and increased energy.

## 2011-03-17 NOTE — Assessment & Plan Note (Signed)
Patient noted increased urinary frequency especially during the night. She noted that she is more thirsty. While in the clinic she noted she had some abdominal pain and dysuria. All check urine analysis.

## 2011-03-18 ENCOUNTER — Telehealth: Payer: Self-pay | Admitting: Internal Medicine

## 2011-03-18 DIAGNOSIS — R52 Pain, unspecified: Secondary | ICD-10-CM

## 2011-03-18 LAB — C-REACTIVE PROTEIN: CRP: 1.8 mg/dL — ABNORMAL HIGH (ref <0.6)

## 2011-03-18 LAB — SEDIMENTATION RATE: Sed Rate: 14 mm/hr (ref 0–22)

## 2011-03-18 MED ORDER — NAPROXEN SODIUM 275 MG PO TABS: ORAL_TABLET | ORAL | Status: DC

## 2011-03-18 NOTE — Telephone Encounter (Signed)
Patient informed about blood work. Noted Ibuprofen is not working for . I am starting on naproxen. I noted she needs to discontinue the ibuprofen.

## 2011-03-22 NOTE — Telephone Encounter (Signed)
[facility] does not carry naproxen 250 mg but do have 500 mg tabs.  I talked with Dr Loistine Chance and okay to give pt naproxen 500 mg take 1/2 tab  Every 6 hours as needed. # 30 Called into pharmacy

## 2011-03-22 NOTE — Telephone Encounter (Signed)
Pt called asking for pain meds.   It looks like naproxen was never called in for pt. I called into health department today and informed pt.  She also states she never was on IBU.

## 2011-03-31 ENCOUNTER — Encounter: Payer: Self-pay | Admitting: Internal Medicine

## 2011-03-31 ENCOUNTER — Ambulatory Visit (INDEPENDENT_AMBULATORY_CARE_PROVIDER_SITE_OTHER): Payer: Self-pay | Admitting: Internal Medicine

## 2011-03-31 VITALS — BP 128/88 | HR 81 | Temp 97.6°F | Ht 65.0 in | Wt 180.5 lb

## 2011-03-31 DIAGNOSIS — G43909 Migraine, unspecified, not intractable, without status migrainosus: Secondary | ICD-10-CM

## 2011-03-31 DIAGNOSIS — IMO0001 Reserved for inherently not codable concepts without codable children: Secondary | ICD-10-CM

## 2011-03-31 DIAGNOSIS — M797 Fibromyalgia: Secondary | ICD-10-CM

## 2011-03-31 DIAGNOSIS — I10 Essential (primary) hypertension: Secondary | ICD-10-CM

## 2011-03-31 DIAGNOSIS — R52 Pain, unspecified: Secondary | ICD-10-CM

## 2011-03-31 LAB — VITAMIN D 25 HYDROXY (VIT D DEFICIENCY, FRACTURES): Vit D, 25-Hydroxy: 23 ng/mL — ABNORMAL LOW (ref 30–89)

## 2011-03-31 MED ORDER — AMITRIPTYLINE HCL 100 MG PO TABS: 100.0000 mg | ORAL_TABLET | Freq: Every day | ORAL | Status: DC

## 2011-03-31 NOTE — Patient Instructions (Addendum)
Please increase Elavil to 100mg  one tablet at bedtime Get labs today and I will call you with any abnormal lab results You can take over the counter Vitamin B12 supplementation once daily Follow up in 3-4 months or sooner if your symptoms worsen

## 2011-03-31 NOTE — Assessment & Plan Note (Signed)
Well-controlled. Will continue propranolol 80 mg by mouth daily and hydrochlorothiazide 25 mg by mouth daily.

## 2011-03-31 NOTE — Assessment & Plan Note (Addendum)
Patient had multiple negative workup in the past, except for a mildly elevated CRP of 14 on 03/17/2011 which is likely infectious and nonspecific because patient did have boils on her right breast and an elevated WBC of 13 on that day.  After reviewing her medical records, there is a question of psychiatric component that could contribute to her pain. She does have point tenderness on physical exam. Patient has tried Neurontin, Lyrica, amitriptyline, naproxen in the past several years.  She states that Lyrica gave her the most relief. She also insists that B12 shot also help with her numbness and tingling.  I review her labs and B12 level were between 200-400 with a normal MMA. Currently she is on 50 mg up amitriptyline and states that her pain is not well controlled. It patient pain is still not well controlled, we can also try Cymbalta or Lyrica in the future (patient cannot afford these medication at this time because she does not have insurance). -Will increase amitriptyline to 100 mg by mouth each bedtime -Advised patient to take over-the-counter vitamin B12 by mouth daily -Will check vitamin B 12 level, vitamin D -Patient will followup with her primary care doctor in 3-4 months or return sooner if her symptoms continue to worsen  Case discussed with Dr. Rogelia Boga.

## 2011-03-31 NOTE — Progress Notes (Signed)
  HPI: Patient is a 45 year old woman with past medical history of pain disorder, numbness and tingling in the past several years with multiple negative workups in the past presents today for constant pain all over her body which has worsened in the past one month. She states that she has tried multiple medication including Lyrica, Neurontin, amitriptyline, B12 shots and that Lyrica and B12 shot the most. About 2 weeks ago she was found to have an elevated CRP which likely from her right breast boil with an elevated WBC which is now resolved and she was given naproxen twice a day. She states that the naproxen has not helping at all. Patient also complaining of intermittent headache that occurs about 2 times per week, rated 8/10 in severity, quality is aching/sharp pain locating all over her head. She has phonophobia and photophobia along with the headache.  Alleviation factor include a quiet and dark room.  She normally takes Aleve for her headache and mild relief.  She was also seen by a neurologist in the past and was given Imitrex however it did not relieve her symptoms. She denies any family history of migraine headache.  Also denies any chest pain, shortness of breath, visual changes, fever chills, nausea and vomiting. She does take Nexium for her acid reflux and report medication compliance for her hypertension.  ROS: as per HPI  PE: General: alert, well-developed, and cooperative to examination.  Lungs: normal respiratory effort, no accessory muscle use, normal breath sounds, no crackles, and no wheezes. Heart: normal rate, regular rhythm, no murmur, no gallop, and no rub.  Abdomen: soft, non-tender, normal bowel sounds, no distention, no guarding, no rebound tenderness Msk: no joint swelling, no joint warmth, and no redness over joints.  There are point tenderness on her shoulder bilaterally.   Pulses: 2+ DP/PT pulses bilaterally Extremities: No cyanosis, clubbing, edema Neurologic: alert &  oriented X3, cranial nerves II-XII intact, strength normal in all extremities, sensation intact to light touch, and gait normal.  Skin: right breast boiled: resolved Psych: slightly anxious but not depressed appearing, denies any SI/HI Skin: turgor normal and no rashes.  Psych: Oriented X3, memory intact for recent and remote, normally interactive, good eye contact, not anxious appearing, and not depressed appearing.

## 2011-03-31 NOTE — Assessment & Plan Note (Signed)
It appears that patient have chronic migraine, 2 times per week with photophobia and phonophobia. Other differential diagnoses include tension headache and cluster headache however unlikely because patient clinical presentation is more consistent with migraine and that she does have any rhinorrhea or lacrimation eye with a suggestive of cluster headache. She could have tension headache secondary to stress but she does not have band-like description of the headache. She is currently on prophylaxis of migraine  medication including propanolol and amitriptyline. -Will continue current prophylaxis regimen. I do not think that she needs acute/abortive medication at this time. She does report that triptans did not work for her in the past therefore if she does need abortive medication, we might need to try benzodiazepines or opiates.

## 2011-04-05 ENCOUNTER — Emergency Department (HOSPITAL_COMMUNITY)
Admission: EM | Admit: 2011-04-05 | Discharge: 2011-04-05 | Disposition: A | Discharge status: Home / Self Care | Payer: Self-pay | Attending: Emergency Medicine | Admitting: Emergency Medicine

## 2011-04-05 ENCOUNTER — Emergency Department (HOSPITAL_COMMUNITY): Payer: Self-pay

## 2011-04-05 DIAGNOSIS — M25519 Pain in unspecified shoulder: Secondary | ICD-10-CM | POA: Insufficient documentation

## 2011-04-05 DIAGNOSIS — R0989 Other specified symptoms and signs involving the circulatory and respiratory systems: Secondary | ICD-10-CM | POA: Insufficient documentation

## 2011-04-05 DIAGNOSIS — K219 Gastro-esophageal reflux disease without esophagitis: Secondary | ICD-10-CM | POA: Insufficient documentation

## 2011-04-05 DIAGNOSIS — R0609 Other forms of dyspnea: Secondary | ICD-10-CM | POA: Insufficient documentation

## 2011-04-05 DIAGNOSIS — M542 Cervicalgia: Secondary | ICD-10-CM | POA: Insufficient documentation

## 2011-04-05 DIAGNOSIS — R0789 Other chest pain: Secondary | ICD-10-CM | POA: Insufficient documentation

## 2011-04-05 DIAGNOSIS — R059 Cough, unspecified: Secondary | ICD-10-CM | POA: Insufficient documentation

## 2011-04-05 DIAGNOSIS — R61 Generalized hyperhidrosis: Secondary | ICD-10-CM | POA: Insufficient documentation

## 2011-04-05 DIAGNOSIS — M79609 Pain in unspecified limb: Secondary | ICD-10-CM | POA: Insufficient documentation

## 2011-04-05 DIAGNOSIS — I10 Essential (primary) hypertension: Secondary | ICD-10-CM | POA: Insufficient documentation

## 2011-04-05 DIAGNOSIS — R05 Cough: Secondary | ICD-10-CM | POA: Insufficient documentation

## 2011-04-05 DIAGNOSIS — E039 Hypothyroidism, unspecified: Secondary | ICD-10-CM | POA: Insufficient documentation

## 2011-04-05 LAB — BASIC METABOLIC PANEL
CO2: 31 mEq/L (ref 19–32)
Calcium: 9.6 mg/dL (ref 8.4–10.5)
Chloride: 99 mEq/L (ref 96–112)
Glucose, Bld: 85 mg/dL (ref 70–99)
Potassium: 3.8 mEq/L (ref 3.5–5.1)
Sodium: 138 mEq/L (ref 135–145)

## 2011-04-05 LAB — DIFFERENTIAL
Basophils Relative: 0 % (ref 0–1)
Eosinophils Absolute: 0.3 10*3/uL (ref 0.0–0.7)
Lymphs Abs: 3.8 10*3/uL (ref 0.7–4.0)
Monocytes Relative: 6 % (ref 3–12)
Neutro Abs: 4.5 10*3/uL (ref 1.7–7.7)
Neutrophils Relative %: 49 % (ref 43–77)

## 2011-04-05 LAB — CBC
Hemoglobin: 13.8 g/dL (ref 12.0–15.0)
MCH: 30.7 pg (ref 26.0–34.0)
MCV: 90.4 fL (ref 78.0–100.0)
Platelets: 247 10*3/uL (ref 150–400)
RBC: 4.49 MIL/uL (ref 3.87–5.11)
WBC: 9.2 10*3/uL (ref 4.0–10.5)

## 2011-04-05 LAB — TROPONIN I: Troponin I: <0.3 ng/mL (ref <0.30)

## 2011-04-19 ENCOUNTER — Other Ambulatory Visit: Payer: Self-pay | Admitting: Internal Medicine

## 2011-04-19 MED ORDER — VITAMIN D (ERGOCALCIFEROL) 1.25 MG (50000 UNIT) PO CAPS: ORAL_CAPSULE | ORAL | Status: DC

## 2011-04-19 MED ORDER — CHOLECALCIFEROL 25 MCG (1000 UT) PO TABS: 1000.0000 [IU] | ORAL_TABLET | Freq: Every day | ORAL | Status: DC

## 2011-04-26 NOTE — Progress Notes (Signed)
Pt informed, rx called into health dept.Amy Spittle Cassady8/6/20128:51 AM

## 2011-04-27 ENCOUNTER — Ambulatory Visit (HOSPITAL_COMMUNITY): Payer: Self-pay | Attending: Internal Medicine

## 2011-06-16 LAB — URINALYSIS, ROUTINE W REFLEX MICROSCOPIC
Glucose, UA: 100 — AB
Protein, ur: >300 — AB
Specific Gravity, Urine: 1.025
pH: 6.5

## 2011-06-16 LAB — URINE MICROSCOPIC-ADD ON

## 2011-07-05 LAB — CBC
Hemoglobin: 12.4
MCHC: 33.4
Platelets: 359
RDW: 15.1 — ABNORMAL HIGH

## 2011-07-05 LAB — I-STAT 8, (EC8 V) (CONVERTED LAB)
Acid-base deficit: 1
HCT: 41
Hemoglobin: 13.9
Operator id: 277751
Potassium: 4.1
Sodium: 139
TCO2: 26

## 2011-07-05 LAB — URINALYSIS, ROUTINE W REFLEX MICROSCOPIC
Bilirubin Urine: NEGATIVE
Hgb urine dipstick: NEGATIVE
Protein, ur: NEGATIVE
Urobilinogen, UA: 1

## 2011-07-05 LAB — POCT I-STAT CREATININE
Creatinine, Ser: 1.2
Operator id: 277751

## 2011-07-05 LAB — DIFFERENTIAL
Basophils Absolute: 0
Basophils Relative: 0
Eosinophils Relative: 1
Monocytes Absolute: 1 — ABNORMAL HIGH
Neutro Abs: 8.8 — ABNORMAL HIGH

## 2011-07-05 LAB — PREGNANCY, URINE: Preg Test, Ur: NEGATIVE

## 2011-07-08 LAB — CBC
HCT: 35 — ABNORMAL LOW
HCT: 35.3 — ABNORMAL LOW
Hemoglobin: 11.7 — ABNORMAL LOW
Hemoglobin: 12.7
MCHC: 33.1
MCHC: 33.3
MCV: 85.7
MCV: 86.1
MCV: 86.2
MCV: 86.7
Platelets: 252
Platelets: 258
Platelets: 267
Platelets: 279
Platelets: 279
RBC: 3.92
RBC: 4.29
RBC: 4.5
RDW: 13.9
RDW: 14
RDW: 14
RDW: 14.2 — ABNORMAL HIGH
WBC: 12 — ABNORMAL HIGH
WBC: 7.6

## 2011-07-08 LAB — BASIC METABOLIC PANEL
BUN: 6
CO2: 28
Calcium: 8.6
Calcium: 9.2
Chloride: 105
Creatinine, Ser: 0.74
Creatinine, Ser: 0.76
GFR calc Af Amer: >60
GFR calc Af Amer: >60
GFR calc non Af Amer: >60
Sodium: 140

## 2011-07-08 LAB — URINE CULTURE: Special Requests: NEGATIVE

## 2011-07-08 LAB — CULTURE, BLOOD (ROUTINE X 2): Culture: NO GROWTH

## 2011-07-08 LAB — COMPREHENSIVE METABOLIC PANEL
ALT: 12
Alkaline Phosphatase: 130 — ABNORMAL HIGH
GFR calc non Af Amer: >60
Glucose, Bld: 89
Potassium: 3.8
Sodium: 135
Total Protein: 7.4

## 2011-07-08 LAB — DIFFERENTIAL
Basophils Absolute: 0.1
Lymphocytes Relative: 24
Lymphs Abs: 3.5 — ABNORMAL HIGH
Neutro Abs: 10.1 — ABNORMAL HIGH

## 2011-07-08 LAB — RAPID URINE DRUG SCREEN, HOSP PERFORMED
Amphetamines: NOT DETECTED
Benzodiazepines: NOT DETECTED
Cocaine: NOT DETECTED
Tetrahydrocannabinol: NOT DETECTED

## 2011-07-08 LAB — URINALYSIS, ROUTINE W REFLEX MICROSCOPIC
Nitrite: NEGATIVE
Protein, ur: NEGATIVE
Specific Gravity, Urine: 1.005
Urobilinogen, UA: 1

## 2011-07-08 LAB — LIPASE, BLOOD: Lipase: 19

## 2011-07-08 LAB — CEA: CEA: <0.5

## 2011-07-13 ENCOUNTER — Encounter: Payer: Self-pay | Admitting: Internal Medicine

## 2011-07-20 ENCOUNTER — Encounter: Payer: Self-pay | Admitting: Internal Medicine

## 2011-07-21 ENCOUNTER — Encounter: Payer: Self-pay | Admitting: Internal Medicine

## 2011-08-31 ENCOUNTER — Encounter: Payer: Self-pay | Admitting: Internal Medicine

## 2011-10-05 ENCOUNTER — Encounter: Payer: Self-pay | Admitting: Internal Medicine

## 2011-10-14 ENCOUNTER — Encounter (HOSPITAL_COMMUNITY): Payer: Self-pay | Admitting: Emergency Medicine

## 2011-10-14 ENCOUNTER — Emergency Department (HOSPITAL_COMMUNITY)
Admission: EM | Admit: 2011-10-14 | Discharge: 2011-10-14 | Disposition: A | Discharge status: Home / Self Care | Payer: Self-pay | Attending: Emergency Medicine | Admitting: Emergency Medicine

## 2011-10-14 ENCOUNTER — Emergency Department (HOSPITAL_COMMUNITY): Payer: Self-pay

## 2011-10-14 ENCOUNTER — Other Ambulatory Visit: Payer: Self-pay

## 2011-10-14 DIAGNOSIS — F3289 Other specified depressive episodes: Secondary | ICD-10-CM | POA: Insufficient documentation

## 2011-10-14 DIAGNOSIS — F411 Generalized anxiety disorder: Secondary | ICD-10-CM | POA: Insufficient documentation

## 2011-10-14 DIAGNOSIS — Z79899 Other long term (current) drug therapy: Secondary | ICD-10-CM | POA: Insufficient documentation

## 2011-10-14 DIAGNOSIS — F329 Major depressive disorder, single episode, unspecified: Secondary | ICD-10-CM | POA: Insufficient documentation

## 2011-10-14 DIAGNOSIS — I7300 Raynaud's syndrome without gangrene: Secondary | ICD-10-CM | POA: Insufficient documentation

## 2011-10-14 HISTORY — DX: Essential (primary) hypertension: I10

## 2011-10-14 LAB — D-DIMER, QUANTITATIVE: D-Dimer, Quant: 0.31 ug/mL-FEU (ref 0.00–0.48)

## 2011-10-14 LAB — BASIC METABOLIC PANEL
CO2: 30 mEq/L (ref 19–32)
Chloride: 103 mEq/L (ref 96–112)
Glucose, Bld: 104 mg/dL — ABNORMAL HIGH (ref 70–99)
Sodium: 140 mEq/L (ref 135–145)

## 2011-10-14 LAB — CBC
HCT: 38 % (ref 36.0–46.0)
MCV: 91.8 fL (ref 78.0–100.0)
Platelets: 225 10*3/uL (ref 150–400)
RBC: 4.14 MIL/uL (ref 3.87–5.11)
RDW: 12.7 % (ref 11.5–15.5)
WBC: 7.8 10*3/uL (ref 4.0–10.5)

## 2011-10-14 LAB — DIFFERENTIAL
Basophils Absolute: 0 10*3/uL (ref 0.0–0.1)
Lymphocytes Relative: 43 % (ref 12–46)
Lymphs Abs: 3.3 10*3/uL (ref 0.7–4.0)
Neutro Abs: 3.8 10*3/uL (ref 1.7–7.7)

## 2011-10-14 LAB — TROPONIN I: Troponin I: <0.3 ng/mL (ref <0.30)

## 2011-10-14 MED ORDER — KETOROLAC TROMETHAMINE 30 MG/ML IJ SOLN
30.0000 mg | Freq: Once | INTRAMUSCULAR | Status: AC
  Administered 2011-10-14: 30 mg via INTRAVENOUS
  Filled 2011-10-14: qty 1

## 2011-10-14 MED ORDER — CYCLOBENZAPRINE HCL 10 MG PO TABS: 10.0000 mg | ORAL_TABLET | Freq: Two times a day (BID) | ORAL | Status: AC | PRN

## 2011-10-14 MED ORDER — MORPHINE SULFATE 4 MG/ML IJ SOLN
4.0000 mg | Freq: Once | INTRAMUSCULAR | Status: AC
  Administered 2011-10-14: 4 mg via INTRAVENOUS
  Filled 2011-10-14: qty 1

## 2011-10-14 MED ORDER — ASPIRIN 81 MG PO CHEW
324.0000 mg | CHEWABLE_TABLET | Freq: Once | ORAL | Status: AC
  Administered 2011-10-14: 324 mg via ORAL
  Filled 2011-10-14: qty 3
  Filled 2011-10-14: qty 1

## 2011-10-14 MED ORDER — HYDROCODONE-ACETAMINOPHEN 5-325 MG PO TABS: 2.0000 | ORAL_TABLET | ORAL | Status: AC | PRN

## 2011-10-14 NOTE — ED Notes (Signed)
Pt. claimed of  Chronic chest pain over a week now, pain is felt like stabbing and aching, radiates to back, right breast and shoulders with bad headache.

## 2011-10-14 NOTE — ED Notes (Signed)
MD at bedside. 

## 2011-10-14 NOTE — ED Notes (Signed)
Pt reports chest pain is easing off, but headache is still the same.

## 2011-10-14 NOTE — ED Provider Notes (Signed)
History     CSN: [id number]  Arrival date & time 10/14/11  1543   First MD Initiated Contact with Patient 10/14/11 1618      Chief Complaint  Patient presents with  . Chest Pain  . Headache    (Consider location/radiation/quality/duration/timing/severity/associated sxs/prior treatment) HPI Patient presents emergency room with chest pain for over a week or so.  Patient states she first noticed it one morning after having a bit of a headache and feeling like she woke up with a stiff neck. Patient states she had trouble finding a comfortable position to sleep. Following that she then began having some sharp pain in the right side of her chest and right axillary region. Patient had an appointment with her primary doctor but accidentally forgot about it. Appointment is rescheduled not until later this month.  Patient was concerned so she came to the emergency room. She has been having some coughing and feeling a little short of breath. Pt states her legs are a little swollen.  No recent trips or travel.  The pain is sharp.  It increases with movement and palpation.   Past Medical History  Diagnosis Date  . Depression   . Grave's disease 2007    TSH (08/31/2010) = 0.186 (low), free T4 = 0.91 (WNL)  . Raynaud disease 2007  . Anxiety     Past Surgical History  Procedure Date  . Colonoscopy 02/14/2008    done by Dr. Christella Hartigan - small colonic polyp identified, 6 mm in size and per biopsy --> tubular adenoma with no malignancy or high grade dysplasia noted  . Abdominal hysterectomy     Family History  Problem Relation Age of Onset  . Colon cancer Maternal Grandfather     History  Substance Use Topics  . Smoking status: Former Games developer  . Smokeless tobacco: Not on file  . Alcohol Use: No    OB History    Grav Para Term Preterm Abortions TAB SAB Ect Mult Living                  Review of Systems  All other systems reviewed and are negative.    Allergies  Review of patient's  allergies indicates no known allergies.  Home Medications   Current Outpatient Rx  Name Route Sig Dispense Refill  . AMITRIPTYLINE HCL 100 MG PO TABS Oral Take 1 tablet (100 mg total) by mouth at bedtime. 30 tablet 6  . CHOLECALCIFEROL 1000 UNITS PO TABS Oral Take 1 tablet (1,000 Units total) by mouth daily. 30 tablet 11  . ESOMEPRAZOLE MAGNESIUM 40 MG PO CPDR Oral Take 1 capsule (40 mg total) by mouth daily. 90 capsule 4  . HYDROCHLOROTHIAZIDE 25 MG PO TABS Oral Take 1 tablet (25 mg total) by mouth daily. 30 tablet 7  . NAPROXEN SODIUM 275 MG PO TABS  Take 2 tablet first and then you can take 250 mg every 6 hours as needed for pain. 30 tablet 0  . PROPRANOLOL HCL ER 80 MG PO CP24 Oral Take 1 capsule (80 mg total) by mouth daily. 30 capsule 7  . VITAMIN D (ERGOCALCIFEROL) 50000 UNITS PO CAPS  Take one tablet of 50,000 units once weekly x 8 weeks, then take 1,000 units daily thereafter. 8 capsule 0    BP 111/69  Pulse 85  Temp(Src) 98.2 F (36.8 C) (Oral)  Resp 20  SpO2 100%  LMP 01/26/2000  Physical Exam  Nursing note and vitals reviewed. Constitutional: She appears well-developed and  well-nourished. No distress.  HENT:  Head: Normocephalic and atraumatic.  Right Ear: External ear normal.  Left Ear: External ear normal.  Eyes: Conjunctivae are normal. Right eye exhibits no discharge. Left eye exhibits no discharge. No scleral icterus.  Neck: Neck supple. No tracheal deviation present.       No meningismus  Cardiovascular: Normal rate, regular rhythm and intact distal pulses.   Pulmonary/Chest: Effort normal and breath sounds normal. No stridor. No respiratory distress. She has no wheezes. She has no rales. She exhibits tenderness.       Tenderness palpation right axillary region  Abdominal: Soft. Bowel sounds are normal. She exhibits no distension. There is no tenderness. There is no rebound and no guarding.  Musculoskeletal: She exhibits no edema and no tenderness.    Neurological: She is alert. She has normal strength. No sensory deficit. Cranial nerve deficit:  no gross defecits noted. She exhibits normal muscle tone. She displays no seizure activity. Coordination normal.  Skin: Skin is warm and dry. No rash noted.  Psychiatric: She has a normal mood and affect.    ED Course  Procedures (including critical care time)  Date: 10/14/2011  Rate: 82  Rhythm: normal sinus rhythm  QRS Axis: normal  Intervals: normal  ST/T Wave abnormalities: normal  Conduction Disutrbances:none  Narrative Interpretation:   Old EKG Reviewed: unchanged   Labs Reviewed  BASIC METABOLIC PANEL - Abnormal; Notable for the following:    Potassium 3.4 (*)    Glucose, Bld 104 (*)    All other components within normal limits  CBC  DIFFERENTIAL  TROPONIN I  D-DIMER, QUANTITATIVE   Dg Chest 2 View  10/14/2011  *RADIOLOGY REPORT*  Clinical Data: Right-sided chest and low back pain, shortness of breath, headache, smoking history  CHEST - 2 VIEW  Comparison: Chest x-ray of 04/05/2011  Findings: The lungs are clear.  Mediastinal contours appear normal. The heart is stable in size being within upper limits of normal. No bony abnormality is seen.  IMPRESSION: Stable chest x-ray.  No active lung disease.  Original Report Authenticated By: Juline Patch, M.D.     1. Chest pain       MDM  I suspect patient's symptoms are related to musculoskeletal chest pain. She's been having symptoms of muscle aches and headaches.  There is no evidence of pneumonia on her chest x-ray. Her cardiac enzymes are normal and her EKG is unremarkable despite the 1 week of pain. In addition her d-dimer is negative and I doubt pulmonary embolism. Patient be discharged home on pain medications and encouraged followup with her primary doctor.      Celene Kras, MD 10/14/11 [phone]

## 2011-11-18 ENCOUNTER — Ambulatory Visit: Payer: Self-pay | Admitting: Internal Medicine

## 2011-11-30 ENCOUNTER — Other Ambulatory Visit: Payer: Self-pay | Admitting: Internal Medicine

## 2011-11-30 NOTE — Telephone Encounter (Signed)
Approve, but patient needs to come in for follow up since I have not seen patient since 03/2011.  We need to make sure that her K is normal bc she had a K level of 3.4 in Jan 2013 which could be due to her diuretics.  Please make appt for follow up.

## 2011-12-02 ENCOUNTER — Telehealth: Payer: Self-pay | Admitting: *Deleted

## 2011-12-02 NOTE — Telephone Encounter (Signed)
Agree with plan 

## 2011-12-02 NOTE — Telephone Encounter (Signed)
Pt calls and states she has been feeling weak, decreased energy level, gets out of breath walking at school, feeling depressed and stressed, she is given an appt for tomorrow dr Anselm Jungling at 1045, she is advised to go to ED or urg care if she feels she cannot wait for appt and may call 911 if needed, she is agreeable

## 2011-12-03 ENCOUNTER — Ambulatory Visit: Payer: Self-pay | Admitting: Internal Medicine

## 2011-12-03 NOTE — Telephone Encounter (Signed)
Talked with pt - has appt 12/07/11 10:45AM.

## 2011-12-07 ENCOUNTER — Encounter: Payer: Self-pay | Admitting: Internal Medicine

## 2011-12-07 ENCOUNTER — Ambulatory Visit (INDEPENDENT_AMBULATORY_CARE_PROVIDER_SITE_OTHER): Payer: Self-pay | Admitting: Internal Medicine

## 2011-12-07 VITALS — BP 124/87 | HR 87 | Temp 97.3°F | Wt 186.3 lb

## 2011-12-07 DIAGNOSIS — F32A Depression, unspecified: Secondary | ICD-10-CM

## 2011-12-07 DIAGNOSIS — E890 Postprocedural hypothyroidism: Secondary | ICD-10-CM | POA: Insufficient documentation

## 2011-12-07 DIAGNOSIS — F3289 Other specified depressive episodes: Secondary | ICD-10-CM

## 2011-12-07 DIAGNOSIS — Z299 Encounter for prophylactic measures, unspecified: Secondary | ICD-10-CM

## 2011-12-07 DIAGNOSIS — F329 Major depressive disorder, single episode, unspecified: Secondary | ICD-10-CM

## 2011-12-07 DIAGNOSIS — L309 Dermatitis, unspecified: Secondary | ICD-10-CM | POA: Insufficient documentation

## 2011-12-07 DIAGNOSIS — I10 Essential (primary) hypertension: Secondary | ICD-10-CM

## 2011-12-07 DIAGNOSIS — E041 Nontoxic single thyroid nodule: Secondary | ICD-10-CM

## 2011-12-07 DIAGNOSIS — Z1231 Encounter for screening mammogram for malignant neoplasm of breast: Secondary | ICD-10-CM

## 2011-12-07 DIAGNOSIS — L259 Unspecified contact dermatitis, unspecified cause: Secondary | ICD-10-CM

## 2011-12-07 LAB — TSH: TSH: 0.356 u[IU]/mL (ref 0.350–4.500)

## 2011-12-07 LAB — VITAMIN B12: Vitamin B-12: 332 pg/mL (ref 211–911)

## 2011-12-07 LAB — T4, FREE: Free T4: 0.99 ng/dL (ref 0.80–1.80)

## 2011-12-07 MED ORDER — TRIAMCINOLONE ACETONIDE 0.1 % EX CREA: TOPICAL_CREAM | Freq: Two times a day (BID) | CUTANEOUS | Status: DC

## 2011-12-07 NOTE — Patient Instructions (Signed)
Continue taking your medications as prescribed Get labs today and I will call you with any abnormal lab results Start taking Amitriptyline 100mg  one tablet daily Follow up with Dr. Anselm Jungling in 2-4 weeks

## 2011-12-07 NOTE — Assessment & Plan Note (Signed)
Well-controlled. Will continue hctz 25mg  & propranolol 80mg  qd

## 2011-12-07 NOTE — Assessment & Plan Note (Signed)
Will need to take her antidepressant med more consistently, not as needed basis. -Will also repeat her TSH and free T4 given that she had a hx of thyroid nodule with low TSH level in the past -Will also check B12 level -Start taking Amitryiptyline 100mg  one tablet at bedtime -Reassess in 2-4 weeks

## 2011-12-07 NOTE — Progress Notes (Signed)
HPI: Patient is a 46 year old woman with past medical history of depression, pain disorder, numbness and tingling in the past several years with multiple negative workups in the past presents today for follow up.  She thinks her thyroid and Vit B12 are low.  She is not feeling herself, down, no energy, "out of whack", hurting all over her body, loss of interest, decreased in appetite.  She denies any suicidal ideation at this time because she is living with her grandson.  She is having hot flashes during the daytime x 5 years.  She had partial hysterectomy due to fibroids in 2002, so no menstrual cycle.   Thyroid disease: heart palpitation, gaining weight, cold & heat intolerance, no dry skin.  She had hyperthyroidism in the past and took medications for it but then was taken off last year when her TSH normalized.  Sometimes she has trouble with swallowing. She reports medication compliance with Elavil, Nexium, hctz, and propranolol. However, she has only been taking Elavil as needed.  C/o of several patchy dark spots that are itchy on her chest in the past 5-6 months.  No one else in the family has scabies.   Tetanus: does not want it today Mammogram: Due   I spent over 45 minutes with patient  ROS: as per HPI  PE: General: alert, well-developed, and cooperative to examination.  Neck: small 1cm left thyroid nodule appreciated, there is an asymmetric goiter R>L.  No tenderness to palpation.   Lungs: normal respiratory effort, no accessory muscle use, normal breath sounds, no crackles, and no wheezes. Heart: normal rate, regular rhythm, no murmur, no gallop, and no rub.  Abdomen: soft, non-tender, normal bowel sounds, no distention, no guarding, no rebound tenderness Msk: no joint swelling, no joint warmth, and no redness over joints.  Pulses: 2+ DP/PT pulses bilaterally Extremities: No cyanosis, clubbing, edema Neurologic: alert & oriented X3, cranial nerves II-XII intact, strength normal in  all extremities, sensation intact to light touch, and gait normal.  Skin: 2 small patches of 1-2 cm small papules on anterior chest, not in a linear distribution, no drainage or erythema or tenderness or increased in warmth.

## 2011-12-07 NOTE — Assessment & Plan Note (Signed)
It appears that patient has a history of thyroid nodules dated back to 2002.  She had multiple thyroid scan/uptake and ultrasound of soft tissues which indicated multinodular goiter with both hot and cold nodules.  Per patient's report, she was sent to [facility] for consultation and was told that she needed surgery; however, she did not have insurance so did not get surgery.  She also states that she was on suppressive medication for thyroid at some point but was taken off last year.  No other follow up imagings were done since 2008.  I reviewed her chart and did not see any meds unless it is in centricity, I will investigate further.  Her previous TSH levels were low but normalized in 01/2011 and normal free T4. With patient having difficulty with swallowing and symptoms of depression: -Will recheck TSH and free T4 today -Will send for thyroid scan/uptake -May need referral to Endocrinology -She did have a biopsy done in 2008 but I cannot locate the pathology report- will continue to search for results

## 2011-12-07 NOTE — Assessment & Plan Note (Addendum)
The lesions on her chest were patchy and did not appear like scabies, I think it is probably atopic dermatitis. -Will treat with Triamcinolone 0.1% cream bid PRN

## 2011-12-07 NOTE — Assessment & Plan Note (Addendum)
-  Patient does not want Tetanus shot today -Will schedule for mammogram

## 2011-12-08 NOTE — Progress Notes (Signed)
Addended by: Maura Crandall on: 12/08/2011 12:01 PM   Modules accepted: Orders

## 2011-12-13 ENCOUNTER — Other Ambulatory Visit: Payer: Self-pay | Admitting: *Deleted

## 2011-12-14 ENCOUNTER — Ambulatory Visit: Payer: Self-pay | Admitting: Internal Medicine

## 2011-12-14 ENCOUNTER — Encounter (HOSPITAL_COMMUNITY)
Admission: RE | Admit: 2011-12-14 | Discharge: 2011-12-14 | Disposition: A | Discharge status: Home / Self Care | Payer: Self-pay | Source: Ambulatory Visit | Attending: Internal Medicine | Admitting: Internal Medicine

## 2011-12-14 DIAGNOSIS — E041 Nontoxic single thyroid nodule: Secondary | ICD-10-CM

## 2011-12-14 MED ORDER — AMITRIPTYLINE HCL 100 MG PO TABS: 100.0000 mg | ORAL_TABLET | Freq: Every day | ORAL | Status: DC

## 2011-12-14 NOTE — Telephone Encounter (Signed)
Elavil refilled - rx request form faxed to [facility].

## 2011-12-15 ENCOUNTER — Ambulatory Visit (HOSPITAL_COMMUNITY)
Admission: RE | Admit: 2011-12-15 | Discharge: 2011-12-15 | Disposition: A | Discharge status: Home / Self Care | Payer: Self-pay | Source: Ambulatory Visit | Attending: Internal Medicine | Admitting: Internal Medicine

## 2011-12-15 DIAGNOSIS — R002 Palpitations: Secondary | ICD-10-CM | POA: Insufficient documentation

## 2011-12-15 DIAGNOSIS — E042 Nontoxic multinodular goiter: Secondary | ICD-10-CM | POA: Insufficient documentation

## 2011-12-15 MED ORDER — SODIUM PERTECHNETATE TC 99M INJECTION
10.0000 | Freq: Once | INTRAVENOUS | Status: AC | PRN
  Administered 2011-12-15: 10 via INTRAVENOUS

## 2011-12-15 MED ORDER — SODIUM IODIDE I 131 CAPSULE
10.0000 | Freq: Once | INTRAVENOUS | Status: AC | PRN
  Administered 2011-12-15: 10 via ORAL

## 2011-12-16 ENCOUNTER — Ambulatory Visit (INDEPENDENT_AMBULATORY_CARE_PROVIDER_SITE_OTHER): Payer: Self-pay | Admitting: Internal Medicine

## 2011-12-16 ENCOUNTER — Encounter: Payer: Self-pay | Admitting: Internal Medicine

## 2011-12-16 VITALS — BP 134/87 | HR 86 | Temp 97.0°F | Ht 65.0 in | Wt 187.7 lb

## 2011-12-16 DIAGNOSIS — E041 Nontoxic single thyroid nodule: Secondary | ICD-10-CM

## 2011-12-16 DIAGNOSIS — E042 Nontoxic multinodular goiter: Secondary | ICD-10-CM

## 2011-12-16 NOTE — Patient Instructions (Signed)
Will schedule for ultrasound of neck  Follow up with Dr. Anselm Jungling in 2 weeks

## 2011-12-16 NOTE — Assessment & Plan Note (Addendum)
Thyroid uptake scan on 12/08/2011 showed "heterogeneous thyroid scan with a cold nodule in the left upper thyroid lobe and a small hot nodule in the right inferior thyroid lobe. A thyroid scan portion showed upper limits of normal 24-hour I 131 uptake."  This patient had a thorough workup in the past including thyroid scan, neck ultrasounds, FNA at [facility] in 2002 and was apparently recommended for surgery however patient was not able to afford to to lack of medical insurance.  At this time, patient still does not have medical insurance but would like to proceed with further intervention since she does have swallowing problems. I called and discussed the case with Dr. Sharl Ma since patient is not able to afford to see an endocrinologist at this time. He recommends that we start with an ultrasound of the neck to evaluate for the size and appearance of the thyroid nodules.  If the thyroid nodule appears malignant, will send patient for an FNA.  Eventually, will send patient to surgery for further evaluation for either malignancy versus compressive symptoms. -Patient was scheduled for an ultrasound soft tissue on 12/22/2011 at 8:30 AM -I will followup with patient in 2 weeks to discuss results and plans -Per patient's request, I explained the thyroid uptake scan results to her daughter over the phone while patient was present in the office.  Addendum:  Thyroid Biopsy 2008: FNA showed non-neoplastic goiter.  I also reviewed notes from [facility] on 09/26/2009 by Dr. Garey Ham: Apparently, patient was on Methimazole but then stopped.  Definitive treatments include radioactive iodine ablation as well as surgical resection.  Given her financial problem; they have decided to put her back on Methimazole 5mg  daily at that time.  She was also found to have mild Grave's opthalmopathy as well.

## 2011-12-16 NOTE — Progress Notes (Signed)
HPI: Amy [name] is a 46 yo woman with PMH of anxiety/depression, HTN, multiple thyroid nodules presents today for followup on thyroid scan results. Patient had a thyroid uptake scan on 12/08/11 which showed "heterogeneous thyroid scan with a cold nodule in the left upper thyroid lobe and a small hot nodule in the right inferior thyroid lobe and upper limits of normal 24 hour I 131 uptake."  Per patient report, she had FNA at [facility] many years ago and they recommended surgery; however, she does not know if it was malignant or not.  Currently, patient has problem with swallowing due to her multi-nodular goiter. Her last TSH and free T4 were within normal limit.  She reports that the Kenalog cream does help with her pruritus.  Review of system: As per history of present illness  Physical examination:  General: alert, well-developed, and cooperative to examination.  Neck: small 1cm left thyroid nodule appreciated, there is an asymmetric goiter R>L. No tenderness to palpation.  Lungs: normal respiratory effort, no accessory muscle use, normal breath sounds, no crackles, and no wheezes. Heart: normal rate, regular rhythm, no murmur, no gallop, and no rub.  Abdomen: soft, non-tender, normal bowel sounds, no distention, no guarding, no rebound tenderness  Msk: no joint swelling, no joint warmth, and no redness over joints.  Pulses: 2+ DP/PT pulses bilaterally Extremities: No cyanosis, clubbing, edema Neurologic: alert & oriented X3, cranial nerves II-XII intact, strength normal in all extremities, sensation intact to light touch, and gait normal.

## 2011-12-17 ENCOUNTER — Ambulatory Visit
Admission: RE | Admit: 2011-12-17 | Discharge: 2011-12-17 | Disposition: A | Discharge status: Home / Self Care | Payer: Self-pay | Source: Ambulatory Visit | Attending: Internal Medicine | Admitting: Internal Medicine

## 2011-12-17 DIAGNOSIS — Z1231 Encounter for screening mammogram for malignant neoplasm of breast: Secondary | ICD-10-CM

## 2011-12-21 ENCOUNTER — Other Ambulatory Visit: Payer: Self-pay | Admitting: Internal Medicine

## 2011-12-21 DIAGNOSIS — R928 Other abnormal and inconclusive findings on diagnostic imaging of breast: Secondary | ICD-10-CM

## 2011-12-22 ENCOUNTER — Ambulatory Visit (HOSPITAL_COMMUNITY)
Admission: RE | Admit: 2011-12-22 | Discharge: 2011-12-22 | Disposition: A | Discharge status: Home / Self Care | Payer: Self-pay | Source: Ambulatory Visit | Attending: Internal Medicine | Admitting: Internal Medicine

## 2011-12-22 ENCOUNTER — Other Ambulatory Visit (HOSPITAL_COMMUNITY): Payer: Self-pay

## 2011-12-22 DIAGNOSIS — E042 Nontoxic multinodular goiter: Secondary | ICD-10-CM

## 2011-12-22 DIAGNOSIS — E049 Nontoxic goiter, unspecified: Secondary | ICD-10-CM | POA: Insufficient documentation

## 2011-12-23 ENCOUNTER — Other Ambulatory Visit (HOSPITAL_COMMUNITY): Payer: Self-pay

## 2011-12-24 ENCOUNTER — Ambulatory Visit
Admission: RE | Admit: 2011-12-24 | Discharge: 2011-12-24 | Disposition: A | Discharge status: Home / Self Care | Payer: Self-pay | Source: Ambulatory Visit | Attending: Internal Medicine | Admitting: Internal Medicine

## 2011-12-24 ENCOUNTER — Telehealth: Payer: Self-pay | Admitting: *Deleted

## 2011-12-24 DIAGNOSIS — R928 Other abnormal and inconclusive findings on diagnostic imaging of breast: Secondary | ICD-10-CM

## 2011-12-24 NOTE — Telephone Encounter (Signed)
Pt calling to hear lab results - given Dr. Christie Nottingham message: Result Notes     Notes Recorded by Mathis Dad, MD on 12/08/2011 at 8:53 AM TSH, free T4, and Vit B12 were all normal  Pt verbalized understanding that these labs were normal - Trixie Dredge, 12/24/2011, 5:42P

## 2012-01-04 ENCOUNTER — Encounter: Payer: Self-pay | Admitting: Internal Medicine

## 2012-01-12 ENCOUNTER — Ambulatory Visit (INDEPENDENT_AMBULATORY_CARE_PROVIDER_SITE_OTHER): Payer: Self-pay | Admitting: Internal Medicine

## 2012-01-12 ENCOUNTER — Encounter: Payer: Self-pay | Admitting: Internal Medicine

## 2012-01-12 VITALS — BP 122/81 | HR 102 | Temp 97.0°F | Wt 186.2 lb

## 2012-01-12 DIAGNOSIS — E041 Nontoxic single thyroid nodule: Secondary | ICD-10-CM

## 2012-01-12 NOTE — Progress Notes (Signed)
Patient ID: Amy [name], female   DOB: Apr 29, 1966, 46 y.o.   MRN: [id number]  Amy [name] returns to the clinic to discuss about her hypothyroidism treatment  Patient has a long history of Graves' disease treated with methimazole in the past which was stopped more than a year ago when her thyroid function was found to be normal. She was seen by an endocrinologist at [facility] on 09/26/09 for her Graves' disease. They recommended radioactive iodine ablation but she had financial problems at that time so this could not be done. Her thyroid function was normal she was not prescribed methimazole. She started developing obstructive symptoms like difficulty swallowing, neck fullness and discomfort eventually. A thyroid uptake scan was performed in March 2012 lead showed a cold and a hot nodule in the thyroid along with other small nodules. The cold nodule was biopsied at [facility] in 2002 and was negative for malignancy. The size had increased since then. After discussion with Dr. Sharl Ma on the phone, her PCP ordered a thyroid ultrasound to define those nodules better. The ultrasound showed bilateral thyroid nodules which were slightly larger than before but did not comment on the appearance as far as malignancy is concerned.  Patient continues to have symptoms of hot flashes, anxiety, excessive sweating, weight loss, difficulty swallowing, headaches and she attributes to this to her thyroid problem and requests medication for this.  Physical exam  Filed Vitals:   01/12/12 0820  BP: 122/81  Pulse: 102  Temp: 97 F (36.1 C)    Gen.- No acute distress CVS- tachycardia, no murmur Neck- asymmetrical thyroid gland with small nodule palpated on the left Respirations - clear bilaterally Abdomen- no distention or tenderness Neurological- grossly nonfocal  Review of systems- as per history

## 2012-01-13 ENCOUNTER — Other Ambulatory Visit: Payer: Self-pay | Admitting: *Deleted

## 2012-01-13 NOTE — Assessment & Plan Note (Signed)
Graves disease untreated. Multinodular goiter. Hot and cold nodule seen without any evidence of malignancy. Compressive symptoms from enlarging thyroid gland.   Plan Refer to general surgery for possible resection (Dr. Anselm Jungling discussed this with Dr. Sharl Ma on the phone last time she saw her)  Continue metoprolol Hold off on methimazole because of normal thyroid function

## 2012-01-13 NOTE — Telephone Encounter (Signed)
Pt called with question about what can be done for her.  She wants the surgery or iodine treatment. She does not feel like herself, symptoms are getting worse.  She is tired of sweating all the time, day and night. Pt saw Dr Scot Dock on 4/24 and he was to work on a referral.  Please advise Pt # [phone]

## 2012-01-14 ENCOUNTER — Other Ambulatory Visit: Payer: Self-pay | Admitting: Internal Medicine

## 2012-01-14 DIAGNOSIS — E041 Nontoxic single thyroid nodule: Secondary | ICD-10-CM

## 2012-01-14 MED ORDER — ESOMEPRAZOLE MAGNESIUM 40 MG PO CPDR: 40.0000 mg | DELAYED_RELEASE_CAPSULE | Freq: Every day | ORAL | Status: DC

## 2012-01-14 NOTE — Telephone Encounter (Signed)
I talked with Amy [name] and she would like to have the surgery.  Can you please do the referral and send to your nurse to schedule. Thanks

## 2012-01-14 NOTE — Telephone Encounter (Signed)
Rx called in to pharmacy. 

## 2012-01-14 NOTE — Telephone Encounter (Signed)
As I have discussed with patient even prior to her ultrasound, Dr. Sharl Ma recommended surgery since she has compressive symptoms and problems with swallowing.  I also spoke to Dr. Scot Dock 2 days ago after he saw patient.  It would be very difficult for her to see an endocrinologist since she does not have insurance, she would need to go back to [facility].  If she agrees with surgery, we can send a referral for general surgery.

## 2012-01-18 ENCOUNTER — Telehealth: Payer: Self-pay | Admitting: *Deleted

## 2012-01-18 NOTE — Telephone Encounter (Signed)
Pt calls - states she has no energy, dark circles under her eyes,and continues to sweats. She was seen last 4/24.  Hx of thyroid problems. States she Feels sooo bad. Wants to know if anything can be done until seen by ? Surgeon (mentioned at last office visit).  Should we schedule another appt? Please advise.

## 2012-01-18 NOTE — Telephone Encounter (Signed)
Appt scheduled with Dr Anselm Jungling, May 21 @ 3:15PM; pt agreed.

## 2012-01-18 NOTE — Telephone Encounter (Signed)
Reviewed record.  This patient has depression and has expressed similar complaints before.  Labs, weight and V.S are stable.  If she needs to be seen again, it should be with her PCP who knows her.

## 2012-01-28 ENCOUNTER — Other Ambulatory Visit: Payer: Self-pay | Admitting: *Deleted

## 2012-01-28 DIAGNOSIS — I10 Essential (primary) hypertension: Secondary | ICD-10-CM

## 2012-01-29 MED ORDER — PROPRANOLOL HCL ER 80 MG PO CP24: 80.0000 mg | ORAL_CAPSULE | Freq: Every day | ORAL | Status: DC

## 2012-01-31 NOTE — Telephone Encounter (Signed)
Rx called in to pharmacy. 

## 2012-02-03 ENCOUNTER — Telehealth: Payer: Self-pay | Admitting: *Deleted

## 2012-02-03 NOTE — Telephone Encounter (Signed)
Called patient to inform her that [facility] is unable to make general surgery referral for thyroid surgery.  Per [facility], pt must contact [facility] directly at [phone] and speak with the financial dept.  This can only be done once pt is initially seen for consultation which usually costs about $236 (pt must bring half to be seen).  Pt aware of this and inquired about other options available.  She has a f/u appt on 05/21 with Dr Anselm Jungling and she was advised to discuss other options at that time.Kingsley Spittle Cassady5/16/201310:34 AM

## 2012-02-08 ENCOUNTER — Encounter: Payer: Self-pay | Admitting: Internal Medicine

## 2012-02-08 ENCOUNTER — Ambulatory Visit (INDEPENDENT_AMBULATORY_CARE_PROVIDER_SITE_OTHER): Payer: Self-pay | Admitting: Internal Medicine

## 2012-02-08 VITALS — BP 127/88 | HR 117 | Temp 98.4°F | Wt 186.8 lb

## 2012-02-08 DIAGNOSIS — R000 Tachycardia, unspecified: Secondary | ICD-10-CM

## 2012-02-08 DIAGNOSIS — E041 Nontoxic single thyroid nodule: Secondary | ICD-10-CM

## 2012-02-08 DIAGNOSIS — R232 Flushing: Secondary | ICD-10-CM | POA: Insufficient documentation

## 2012-02-08 DIAGNOSIS — E8941 Symptomatic postprocedural ovarian failure: Secondary | ICD-10-CM

## 2012-02-08 MED ORDER — NORETHINDRONE-ETH ESTRADIOL 1-35 MG-MCG PO TABS: 1.0000 | ORAL_TABLET | Freq: Every day | ORAL | Status: DC

## 2012-02-08 MED ORDER — METHIMAZOLE 5 MG PO TABS: 5.0000 mg | ORAL_TABLET | Freq: Three times a day (TID) | ORAL | Status: DC

## 2012-02-08 NOTE — Progress Notes (Signed)
HPI: Ms. Detjen is a 46 yo woman with PMH of depression, htn, fibromyalgia, hot and cold thyroid nodules presents today for thyroid problems.  She also reports having hot flashes in the past 4 years (she had hysterectomy about 7 yrs ago and still has 1 ovary in her, unclear reason why) and said no body did anything about it.  She continues to have problem with swallowing and wants either radioactive iodine or surgery.  She was seen at [facility] in the past; however, cannot go back to her  Endocrinologist because she has a balance.  We did refer her to general surgery however, they are not accepting the [facility] Card any longer so she has to wait for [facility] referral (which they are not accepting new referrals at this time) or go to general surgery at [facility].  She was also on Methimazole in the past and would like to know if she can get back on it to control her symptoms in the mean time since she does not have regular insurance and just lost her job so she cannot afford to see endocrinologist or general surgeon.  She also requests for meds for her hot flashes as well. Patient reports being very anxious and her heart palpitates when I was examining her.  ROS: as per HPI  PE: General: alert, well-developed, and cooperative to examination.  Neck: supple, full ROM, + thyromegaly- goiter bilaterally, no JVD, and no carotid bruits.  Lungs: normal respiratory effort, no accessory muscle use, normal breath sounds, no crackles, and no wheezes. Heart: tachycardic, regular rhythm, no murmur, no gallop, and no rub.  Abdomen: soft, non-tender, normal bowel sounds, no distention, no guarding, no rebound tenderness, no hepatomegaly, and no splenomegaly.  Msk: no joint swelling, no joint warmth, and no redness over joints.

## 2012-02-08 NOTE — Assessment & Plan Note (Signed)
On arrival, pulse was 117 and repeat was 111.  This is likely due to her anxiety but it could be related to her thyroid problem as well. May need BB if still tachycardic

## 2012-02-08 NOTE — Assessment & Plan Note (Signed)
We spoke at length at different options including methimazole, radioactive iodine, endocrinology or general surgery referral.  Her thyroid scan showed both hot and cold nodules and those have increased in size since her last imaging.  I personally called Dr. Sharl Ma and discussed case with him and he kindly agreed to see patient despite lack of insurance since her case is complicated.  He will discuss more with patient when she comes in for evaluation but he thinks radioactive iodine may be an option.  Regarding the issue of restarting Methimazole, Her TSH and free T4 in march 2013 was 0.35 and 0.99 respectively and that there is no indication to resume at this time given it has side effects.  I explained to patient and she agreed not to restart until she sees Dr. Sharl Ma. My nurse also was also able to schedule for a general surgery appt with the resident clinic at [facility] on 02/21/12.  I would like for patient to see Dr. Sharl Ma first prior going to her surgery appointment or if she even need surgery.

## 2012-02-08 NOTE — Assessment & Plan Note (Signed)
Patient is symptomatic in the past 4 yrs, it could be due to menopause vs a symptom of her thyroid problem. -Will try Estradiol 1 to see if it helps  And then will taper off in 6-12 months (I explained to patient possible risks including cancer and she verbalized her understanding and agreed to proceed)

## 2012-02-08 NOTE — Progress Notes (Signed)
Addended by: Carrolyn Meiers T on: 02/08/2012 06:11 PM   Modules accepted: Orders

## 2012-03-08 HISTORY — PX: THYROIDECTOMY: SHX17

## 2012-03-21 ENCOUNTER — Telehealth: Payer: Self-pay | Admitting: *Deleted

## 2012-03-21 NOTE — Telephone Encounter (Signed)
I would advise that she go to urgent care today.

## 2012-03-21 NOTE — Telephone Encounter (Signed)
Pt advised to go to ED for evaluation.  She voices understanding

## 2012-03-21 NOTE — Telephone Encounter (Signed)
Pt called with 2 weeks of being SOB.  If she is chewing something she needs to stop to breath.   She had her thyroid removed on 6/18. She has appointment with surgeon on Monday 7/8. Also she gets SOB with ambulation and she also gets tired.  This is not improving.  If she is at rest she has no problem.  She does not use inhaler, denies dizziness. She has Hx of SOB but not as severe as this. We don't have any appointment  Until next Tuesday.  Pt # [id number]

## 2012-04-26 ENCOUNTER — Encounter: Payer: Self-pay | Admitting: Internal Medicine

## 2012-04-27 ENCOUNTER — Ambulatory Visit (INDEPENDENT_AMBULATORY_CARE_PROVIDER_SITE_OTHER): Payer: Self-pay | Admitting: Internal Medicine

## 2012-04-27 ENCOUNTER — Encounter: Payer: Self-pay | Admitting: Internal Medicine

## 2012-04-27 VITALS — BP 108/76 | HR 83 | Temp 98.2°F | Wt 181.8 lb

## 2012-04-27 DIAGNOSIS — Z9181 History of falling: Secondary | ICD-10-CM

## 2012-04-27 DIAGNOSIS — E8941 Symptomatic postprocedural ovarian failure: Secondary | ICD-10-CM

## 2012-04-27 DIAGNOSIS — R296 Repeated falls: Secondary | ICD-10-CM | POA: Insufficient documentation

## 2012-04-27 DIAGNOSIS — E041 Nontoxic single thyroid nodule: Secondary | ICD-10-CM

## 2012-04-27 DIAGNOSIS — E039 Hypothyroidism, unspecified: Secondary | ICD-10-CM

## 2012-04-27 NOTE — Patient Instructions (Signed)
-  Please return in 3 weeks to have your labs drawn.  Please be sure to bring all of your medications with you to every visit.  Should you have any new or worsening symptoms, please be sure to call the clinic at [phone].

## 2012-04-27 NOTE — Progress Notes (Signed)
Subjective:   Patient ID: Amy [name] female   DOB: 11/04/1965 46 y.o.   MRN: [id number]  HPI: [name] is a 46 y.o. woman with history of Graves' disease with multinodular goiter, who is status post thyroidectomy on 03/08/2012. Surgery was done at [facility]. After surgery, she was started on levothyroxine 125 mcg, and 3 weeks ago she was called by her surgeon to decrease dose, as her thyroid levels were too high. Since then, she has been taking 62.5 mcg of Synthroid daily. She does complain of having had palpitations, and she has difficulty with sleep, both falling asleep and staying asleep. She reports no change in weight or bowel movements.  She also complains of hot flashes, and is status post hysterectomy. This has been an ongoing problem, and she reports that she was supposed to have been tested to see if she was in menopause.  Finally she reports that she's been having frequent falls for the last year. They come out of nowhere. She denies knee or ankle pain. She cannot recall the circumstances surrounding the fall. She does not believe that there mechanical fall, but she is tripping on anything. She does note occasional headache.   Past Medical History  Diagnosis Date  . Depression   . Grave's disease 2007    TSH (08/31/2010) = 0.186 (low), free T4 = 0.91 (WNL)  . Raynaud disease 2007  . Anxiety   . Hypertension   . Postsurgical hypothyroidism    Current Outpatient Prescriptions  Medication Sig Dispense Refill  . amitriptyline (ELAVIL) 100 MG tablet Take 1 tablet (100 mg total) by mouth at bedtime.  30 tablet  6  . esomeprazole (NEXIUM) 40 MG capsule Take 1 capsule (40 mg total) by mouth daily.  90 capsule  4  . hydrochlorothiazide (HYDRODIURIL) 25 MG tablet take 1 tablet by mouth once daily  30 tablet  7  . levothyroxine (SYNTHROID, LEVOTHROID) 125 MCG tablet Take 62.5 mcg by mouth daily.      . propranolol ER (INDERAL LA) 80 MG 24 hr capsule Take 1 capsule (80  mg total) by mouth daily.  30 capsule  7  . norethindrone-ethinyl estradiol 1/35 (ORTHO-NOVUM 1/35, 28,) tablet Take 1 tablet by mouth daily.  1 Package  11  . DISCONTD: amitriptyline (ELAVIL) 50 MG tablet Take 1 tablet (50 mg total) by mouth at bedtime.  30 tablet  11   Family History  Problem Relation Age of Onset  . Colon cancer Maternal Grandfather   . Hypertension Mother   . Thyroid disease Mother   . Hypertension Father    History   Social History  . Marital Status: Single    Spouse Name: N/A    Number of Children: N/A  . Years of Education: N/A   Social History Main Topics  . Smoking status: Former Smoker -- 0.3 packs/day    Types: Cigarettes    Quit date: 10/13/1996  . Smokeless tobacco: None  . Alcohol Use: No     occasional  . Drug Use: No  . Sexually Active: No   Other Topics Concern  . None   Social History Narrative   Financial assistance approved for 80% discount at [facility] and has [facility] card per [place]11/12/2009   Review of Systems: General: no fevers, chills, changes in weight, changes in appetite Skin: no rash HEENT: no blurry vision, hearing changes, sore throat Pulm: no dyspnea, coughing, wheezing; +DOE CV: no chest pain, palpitations, shortness of breath at rest  Abd: no abdominal pain, nausea/vomiting, diarrhea/constipation GU: no dysuria, hematuria, polyuria Ext: no arthralgias, myalgias Neuro: no weakness, numbness, or tingling   Objective:  Physical Exam: Filed Vitals:   04/27/12 1534  BP: 108/76  Pulse: 83  Temp: 98.2 F (36.8 C)  TempSrc: Oral  Weight: 181 lb 12.8 oz (82.464 kg)   Constitutional: Vital signs reviewed.  Patient is a well-developed and well-nourished woman in no acute distress and cooperative with exam.  Eyes: PERRL, EOMI, conjunctivae normal, No scleral icterus.  Neck: 2 inch surgical incision wound from thyroidectomy healing well, without erythema or discharge or edema Cardiovascular: RRR, S1 normal, S2 normal, no  MRG, pulses symmetric and intact bilaterally Pulmonary/Chest: CTAB, no wheezes, rales, or rhonchi Abdominal: Soft. Non-tender, non-distended, bowel sounds are normal, no masses, organomegaly, or guarding present.  Musculoskeletal: No joint deformities, erythema, or stiffness, ROM full and no nontender Neurological: A&O x3, Strength is normal and symmetric bilaterally, cranial nerve II-XII are grossly intact, no focal motor deficit, sensory intact to light touch bilaterally.  Skin: Warm, dry and intact. No rash, cyanosis, or clubbing.  Psychiatric: Normal mood and affect. speech and behavior is normal. Judgment and thought content normal. Cognition and memory are normal.   Assessment & Plan:   Case and care discussed with Dr. Rogelia Boga. Please see problem ointment charting for further details. Patient to return in 2 months for followup with PCP, but were turned in 3 weeks for blood draw.

## 2012-04-27 NOTE — Assessment & Plan Note (Signed)
Patient is status post thyroidectomy on 03/08/2012. After surgery, she started 125 mcg of levothyroxine daily. About 3 weeks ago she was called by her physician at [facility] and told that her thyroid levels were too high and that she should decrease her dose. For the last 3 weeks, she has been cutting tablets in half and taking 62.5 mcg of levothyroxine daily. She is instructed that she needed to follow up in our clinic to check a TSH and calcium.  -Return in 3 weeks for lab draw, so that she will have been on this new dose for 6 weeks total -Followup with Dr. Anselm Jungling in 2 months, so that if in 3 weeks her thyroid levels are abnormal, we can adjust dose and she can be followed up in another 6 weeks. -We will also check CMET at that time to monitor calcium

## 2012-04-27 NOTE — Assessment & Plan Note (Signed)
Frequent falls over the last year, but other symptoms are difficult to interpret in setting of thyroid disease. I told her that it is best if we reevaluate this problem after her thyroid levels are optimized. Of note, she had a CT scan of her head in December 2010 because she was experiencing headache and visual disturbance as well as right-sided parasthesias. Head CT was normal. B12 1 year prior was normal.  She denies knee and ankle pain.  Multiple sclerosis is in the differential. Followup in 2 months

## 2012-04-27 NOTE — Assessment & Plan Note (Signed)
Patient did not try estradiol, as she reported that she was told to wait and be tested for menopause first. We discussed possibly starting other symptomatic management such as with an SSRI or gabapentin, but decided that it would be best to pursue this workup once her thyroid levels have been confirmed to be normalized

## 2012-05-02 ENCOUNTER — Encounter: Payer: Self-pay | Admitting: Internal Medicine

## 2012-05-17 ENCOUNTER — Other Ambulatory Visit: Payer: Self-pay | Admitting: Internal Medicine

## 2012-05-17 DIAGNOSIS — N63 Unspecified lump in unspecified breast: Secondary | ICD-10-CM

## 2012-06-27 ENCOUNTER — Encounter: Payer: Self-pay | Admitting: Internal Medicine

## 2012-06-28 ENCOUNTER — Other Ambulatory Visit: Payer: Self-pay

## 2012-07-24 ENCOUNTER — Encounter: Payer: Self-pay | Admitting: Internal Medicine

## 2012-07-24 NOTE — Progress Notes (Signed)
Patient ID: Amy [name], female   DOB: 09-23-1965, 46 y.o.   MRN: [id number]  This patient is a CHRONIC NO-SHOW PATIENT that has a history of HYPERTENSION.  Please make sure to address hypertension during her next clinic appointment, and intervene as appropriate.    Within the AVS, please incorporate the following smartphrase: .htntips   Pertinent Data: BP Readings from Last 3 Encounters:  04/27/12 108/76  02/08/12 127/88  01/12/12 122/81    BMI: Estimated Body mass index is 30.25 kg/(m^2) as calculated from the following:   Height as of 12/16/11: 5\' 5" (1.651 m).   Weight as of 04/27/12: 181 lb 12.8 oz(82.464 kg).  Smoking History: History  Smoking status  . Former Smoker -- 0.3 packs/day  . Types: Cigarettes  . Quit date: 10/13/1996  Smokeless tobacco  . Not on file    Last Basic Metabolic Panel:    Component Value Date/Time   NA 140 10/14/2011 1650   K 3.4* 10/14/2011 1650   CL 103 10/14/2011 1650   CO2 30 10/14/2011 1650   BUN 10 10/14/2011 1650   CREATININE 0.61 10/14/2011 1650   CREATININE 0.77 02/25/2011 1007   GLUCOSE 104* 10/14/2011 1650   CALCIUM 9.3 10/14/2011 1650    Allergies: No Known Allergies

## 2012-08-15 ENCOUNTER — Ambulatory Visit: Payer: Self-pay | Admitting: Internal Medicine

## 2012-08-24 ENCOUNTER — Ambulatory Visit (INDEPENDENT_AMBULATORY_CARE_PROVIDER_SITE_OTHER): Payer: Self-pay | Admitting: Internal Medicine

## 2012-08-24 ENCOUNTER — Encounter: Payer: Self-pay | Admitting: Internal Medicine

## 2012-08-24 VITALS — BP 144/99 | HR 96 | Temp 97.3°F | Ht 65.0 in | Wt 178.5 lb

## 2012-08-24 DIAGNOSIS — Z0000 Encounter for general adult medical examination without abnormal findings: Secondary | ICD-10-CM

## 2012-08-24 DIAGNOSIS — I10 Essential (primary) hypertension: Secondary | ICD-10-CM

## 2012-08-24 DIAGNOSIS — Z23 Encounter for immunization: Secondary | ICD-10-CM

## 2012-08-24 DIAGNOSIS — E890 Postprocedural hypothyroidism: Secondary | ICD-10-CM

## 2012-08-24 LAB — BASIC METABOLIC PANEL
BUN: 9 mg/dL (ref 6–23)
CO2: 31 mEq/L (ref 19–32)
Calcium: 9.5 mg/dL (ref 8.4–10.5)
Glucose, Bld: 70 mg/dL (ref 70–99)
Potassium: 3.7 mEq/L (ref 3.5–5.3)

## 2012-08-24 LAB — TSH: TSH: 43.691 u[IU]/mL — ABNORMAL HIGH (ref 0.350–4.500)

## 2012-08-24 MED ORDER — HYDROCHLOROTHIAZIDE 25 MG PO TABS: 25.0000 mg | ORAL_TABLET | Freq: Every day | ORAL | Status: DC

## 2012-08-24 MED ORDER — LEVOTHYROXINE SODIUM 125 MCG PO TABS: 62.5000 ug | ORAL_TABLET | Freq: Every day | ORAL | Status: DC

## 2012-08-24 MED ORDER — ESOMEPRAZOLE MAGNESIUM 40 MG PO CPDR: 40.0000 mg | DELAYED_RELEASE_CAPSULE | Freq: Every day | ORAL | Status: DC

## 2012-08-24 MED ORDER — AMITRIPTYLINE HCL 100 MG PO TABS: 100.0000 mg | ORAL_TABLET | Freq: Every day | ORAL | Status: DC

## 2012-08-24 MED ORDER — PROPRANOLOL HCL ER 80 MG PO CP24: 80.0000 mg | ORAL_CAPSULE | Freq: Every day | ORAL | Status: DC

## 2012-08-24 NOTE — Patient Instructions (Addendum)
General Instructions: We have sent the HCTZ to [facility] We have provided prescriptions and will call in the other medications includinf the thyroid and heartburn medication to [facility]. We gave you the flu shot today. We will also check your thyroid level and calcium level today. Follow-up in 4-6 weeks.   Treatment Goals:  Goals (1 Years of Data) as of 08/24/2012    None      Progress Toward Treatment Goals:  Treatment Goal 08/24/2012  Blood pressure deteriorated    Self Care Goals & Plans:  Self Care Goal 08/24/2012  Manage my medications take my medicines as prescribed; bring my medications to every visit  Eat healthy foods eat more vegetables  Stop smoking go to the [employer] ([email or web address])       Care Management & Community Referrals:  Referral 08/24/2012  Referrals made for care management support financial counselor         WEIGHT REDUCTION:  Strategies: A healthy weight loss program includes:  A calorie restricted diet based on individual calorie needs.   Increased physical activity (exercise).  An exercise program is just as important as the right low-calorie diet.    An unhealthy weight loss program includes:  Fasting.   Fad diets.   Supplements and drugs.  These choices do not succeed in long-term weight control.   Home Care Instructions: To help you make the needed dietary changes:   Exercise and perform physical activity as directed by your caregiver.   Keep a daily record of everything you eat. There are many free websites to help you with this. It may be helpful to measure your foods so you can determine if you are eating the correct portion sizes.   Use low-calorie cookbooks or take special cooking classes.   Avoid alcohol. Drink more water and drinks with no calories.   Take vitamins and supplements only as recommended by your caregiver.   Weight loss support groups, Registered Dieticians, counselors, and  stress reduction education can also be very helpful.   ________________________________________________________________________  DASH DIET:  The DASH diet stands for "Dietary Approaches to Stop Hypertension." It is a healthy eating plan that has been shown to reduce high blood pressure (hypertension) in as little as 14 days, while also possibly providing other significant health benefits. These other health benefits include reducing the risk of breast cancer after menopause and reducing the risk of type 2 diabetes, heart disease, colon cancer, and stroke. Health benefits also include weight loss and slowing kidney failure in patients with chronic kidney disease.   Diet guidelines: Limit salt (sodium). Your diet should contain less than 1500 mg of sodium daily.  Limit refined or processed carbohydrates. Your diet should include mostly whole grains. Desserts and added sugars should be used sparingly.  Include small amounts of heart-healthy fats. These types of fats include nuts, oils, and tub margarine. Limit saturated and trans fats. These fats have been shown to be harmful in the body.   Choosing Foods: The following food groups are based on a 2000 calorie diet. See your Registered Dietitian for individual calorie needs.  Grains and Grain Products (6 to 8 servings daily)  Eat More Often: Whole-wheat bread, brown rice, whole-grain or wheat pasta, quinoa, popcorn without added fat or salt (air popped).  Eat Less Often: White bread, white pasta, white rice, cornbread.  Vegetables (4 to 5 servings daily)  Eat More Often: Fresh, frozen, and canned vegetables. Vegetables may be raw, steamed, roasted, or grilled with  a minimal amount of fat.  Eat Less Often/Avoid: Creamed or fried vegetables. Vegetables in a cheese sauce.  Fruit (4 to 5 servings daily)  Eat More Often: All fresh, canned (in natural juice), or frozen fruits. Dried fruits without added sugar. One hundred percent fruit juice ( cup [237  mL] daily).  Eat Less Often: Dried fruits with added sugar. Canned fruit in light or heavy syrup.  [employer], Fish, and Poultry (2 servings or less daily. One serving is 3 to 4 oz [85-114 g]).  Eat More Often: Ninety percent or leaner ground beef, tenderloin, sirloin. Round cuts of beef, chicken breast, [place] breast. All fish. Grill, bake, or broil your meat. Nothing should be fried.  Eat Less Often/Avoid: Fatty cuts of meat, [place], or chicken leg, thigh, or wing. Fried cuts of meat or fish.  Dairy (2 to 3 servings)  Eat More Often: Low-fat or fat-free milk, low-fat plain or light yogurt, reduced-fat or part-skim cheese.  Eat Less Often/Avoid: Milk (whole, 2%, skim, or chocolate). Whole milk yogurt. Full-fat cheeses.  Nuts, Seeds, and Legumes (4 to 5 servings per week)  Eat More Often: All without added salt.  Eat Less Often/Avoid: Salted nuts and seeds, canned beans with added salt.  Fats and Sweets (limited)  Eat More Often: Vegetable oils, tub margarines without trans fats, sugar-free gelatin. Mayonnaise and salad dressings.  Eat Less Often/Avoid: Coconut oils, palm oils, butter, stick margarine, cream, half and half, cookies, candy, pie.   ________________________________________________________________________  Smoking Cessation Tips 1-800-QUIT-NOW  This document explains the best ways for you to quit smoking and new treatments to help. It lists new medicines that can double or triple your chances of quitting and quitting for good. It also considers ways to avoid relapses and concerns you may have about quitting, including weight gain.   Nicotine: A Powerful Addiction If you have tried to quit smoking, you know how hard it can be. It is hard because nicotine is a very addictive drug. For some people, it can be as addictive as heroin or cocaine. Usually, people make 2 or 3 tries, or more, before finally being able to quit. Each time you try to quit, you can learn about what helps and  what hurts. Quitting takes hard work and a lot of effort, but you can quit smoking.   Quitting smoking is one of the most important things you will ever do You will live longer, feel better, and live better.  The impact on your body of quitting smoking is felt almost immediately:   Five keys to quitting: Studies have shown that these 5 steps will help you quit smoking and quit for good. You have the best chances of quitting if you use them together:   1. GET READY  Set a quit date.  Change your environment.  Get rid of ALL cigarettes, ashtrays, matches, and lighters in your home, car, and place of work.  Do not let people smoke in your home.  Review your past attempts to quit. Think about what worked and what did not.  Once you quit, do not smoke. NOT EVEN A PUFF!   2. GET SUPPORT AND ENCOURAGEMENT  Tell your family, friends, and coworkers that you are going to quit and need their support. Ask them not to smoke around you.  Get individual, group, or telephone counseling and support.  Many smokers find one or more of the many self-help books available useful in helping them quit and stay off tobacco.  3. LEARN NEW SKILLS AND BEHAVIORS  Try to distract yourself from urges to smoke. Talk to someone, go for a walk, or occupy your time with a task.  When you first try to quit, change your routine. Take a different route to work. Drink tea instead of coffee. Eat breakfast in a different place.  Do something to reduce your stress. Take a hot bath, exercise, or read a book.  Plan something enjoyable to do every day. Reward yourself for not smoking.  Explore interactive web-based programs that specialize in helping you quit.   4. GET MEDICINE AND USE IT CORRECTLY .  Medicines can help you stop smoking and decrease the urge to smoke. Combining medicine with the above behavioral methods and support can quadruple your chances of successfully quitting smoking.  Talk with your doctor about these  options.  5. BE PREPARED FOR RELAPSE OR DIFFICULT SITUATIONS  Most relapses occur within the first 3 months after quitting. Do not be discouraged if you start smoking again. Remember, most people try several times before they finally quit.  You may have symptoms of withdrawal because your body is used to nicotine. You may crave cigarettes, be irritable, feel very hungry, cough often, get headaches, or have difficulty concentrating.  The withdrawal symptoms are only temporary. They are strongest when you first quit, but they will go away within 10 to 14 days.   Quitting takes hard work and a lot of effort, but you can quit smoking.   FOR MORE INFORMATION  Smokefree.gov (http://www.davis-sullivan.com/) provides free, accurate, evidence-based information and professional assistance to help support the immediate and long-term needs of people trying to quit smoking.  Document Released: 08/31/2001 Document Re-Released: 02/24/2010  [facility] Patient Information 2011 [place], [place].

## 2012-08-24 NOTE — Assessment & Plan Note (Signed)
BP Readings from Last 3 Encounters:  08/24/12 144/99  04/27/12 108/76  02/08/12 127/88    Sodium  Date Value Range Status  10/14/2011 140  135 - 145 mEq/L Final     Potassium  Date Value Range Status  10/14/2011 3.4* 3.5 - 5.1 mEq/L Final     Creatinine, Ser  Date Value Range Status  10/14/2011 0.61  0.50 - 1.10 mg/dL Final    Assessment:  Blood pressure control: mildly elevated  Progress toward BP goal:  deteriorated  Comments: has not been on medications for several months due to inability to afford even $4 drugs  Plan:  Medications:  continue current medications  Educational resources provided:    Self management tools provided:    Other plans: refer to Artist

## 2012-08-24 NOTE — Progress Notes (Signed)
  Subjective:    Patient ID: Amy [name], female    DOB: 03/12/1966, 46 y.o.   MRN: [id number]  HPI  Pt with hypothyroidism and hypertension presents for medication refills.  No complaints but requesting flu-shot.  States that she had been unable to afford medications for several months but her mother will help to fill prescriptions on Monday.  States that she is looking forward to starting a new job at [employer].  Review of Systems  Constitutional: Negative for fever, chills, diaphoresis, appetite change and fatigue.  HENT: Negative for congestion.   Eyes: Negative for visual disturbance.  Respiratory: Negative for cough and shortness of breath.   Cardiovascular: Negative for chest pain, palpitations and leg swelling.  Neurological: Negative for tremors and weakness.  Psychiatric/Behavioral: Negative for dysphoric mood.       Objective:   Physical Exam  Constitutional: She is oriented to person, place, and time. She appears well-developed and well-nourished. No distress.  HENT:  Head: Normocephalic and atraumatic.  Eyes: Conjunctivae normal and EOM are normal. Pupils are equal, round, and reactive to light.  Neck: Normal range of motion. Neck supple. No thyromegaly present.  Cardiovascular: Regular rhythm and normal heart sounds.        Slightly tachycardic  Pulmonary/Chest: Effort normal and breath sounds normal.  Abdominal: Soft. Bowel sounds are normal.  Musculoskeletal: Normal range of motion. She exhibits no edema.  Neurological: She is alert and oriented to person, place, and time.  Skin: Skin is warm and dry.  Psychiatric: She has a normal mood and affect.          Assessment & Plan:  1. Hypothyroidism, post surgical: will check TSH and refill Synthroid today at previous doasge of 62.5 mcg qd  2. Htn: slighly above goal, will refill HCTZ today  3. Dispo: pt to meet with Financial Counselor on Monday for medication assistance via [employer]

## 2012-08-28 ENCOUNTER — Ambulatory Visit: Payer: Self-pay

## 2012-08-29 ENCOUNTER — Telehealth: Payer: Self-pay | Admitting: *Deleted

## 2012-08-29 NOTE — Telephone Encounter (Signed)
reviewed

## 2012-08-30 ENCOUNTER — Other Ambulatory Visit: Payer: Self-pay

## 2012-09-05 ENCOUNTER — Other Ambulatory Visit: Payer: Self-pay

## 2012-09-06 ENCOUNTER — Ambulatory Visit
Admission: RE | Admit: 2012-09-06 | Discharge: 2012-09-06 | Disposition: A | Discharge status: Home / Self Care | Payer: No Typology Code available for payment source | Source: Ambulatory Visit | Attending: Internal Medicine | Admitting: Internal Medicine

## 2012-09-06 ENCOUNTER — Ambulatory Visit: Payer: Self-pay

## 2012-09-06 DIAGNOSIS — N63 Unspecified lump in unspecified breast: Secondary | ICD-10-CM

## 2012-09-26 ENCOUNTER — Telehealth: Payer: Self-pay | Admitting: *Deleted

## 2012-09-26 NOTE — Telephone Encounter (Signed)
Pt called asking for results of TSH that was done 12/5  ([phone]) Pt # [phone]

## 2012-09-26 NOTE — Telephone Encounter (Signed)
She will need to repeat a TSH in about 2-4 weeks and also free T4 so that we can adjust her levothyroxine accordingly.

## 2012-09-26 NOTE — Addendum Note (Signed)
Addended by: Bufford Spikes on: 09/26/2012 02:37 PM   Modules accepted: Orders

## 2012-09-29 NOTE — Telephone Encounter (Signed)
Message left for pt to call clinic.

## 2012-10-02 NOTE — Telephone Encounter (Signed)
Pt scheduled for lab visit 1/27th.  Please enter labs you wanted drawn.

## 2012-10-06 ENCOUNTER — Other Ambulatory Visit: Payer: Self-pay | Admitting: Internal Medicine

## 2012-10-06 DIAGNOSIS — E890 Postprocedural hypothyroidism: Secondary | ICD-10-CM

## 2012-10-06 NOTE — Telephone Encounter (Signed)
TSH and free T4 orders are in EPIC for 1/27th blood drawn

## 2012-10-16 ENCOUNTER — Other Ambulatory Visit (INDEPENDENT_AMBULATORY_CARE_PROVIDER_SITE_OTHER): Payer: No Typology Code available for payment source

## 2012-10-16 ENCOUNTER — Ambulatory Visit: Payer: No Typology Code available for payment source | Admitting: *Deleted

## 2012-10-16 DIAGNOSIS — Z0000 Encounter for general adult medical examination without abnormal findings: Secondary | ICD-10-CM | POA: Insufficient documentation

## 2012-10-16 DIAGNOSIS — E890 Postprocedural hypothyroidism: Secondary | ICD-10-CM

## 2012-10-16 MED ORDER — TUBERCULIN PPD 5 UNIT/0.1ML ID SOLN
5.0000 [IU] | Freq: Once | INTRADERMAL | Status: AC
  Administered 2012-10-16: 5 [IU] via INTRADERMAL

## 2012-10-17 ENCOUNTER — Other Ambulatory Visit: Payer: Self-pay | Admitting: Internal Medicine

## 2012-10-17 MED ORDER — LEVOTHYROXINE SODIUM 88 MCG PO TABS: 88.0000 ug | ORAL_TABLET | Freq: Every day | ORAL | Status: DC

## 2012-10-24 ENCOUNTER — Ambulatory Visit (INDEPENDENT_AMBULATORY_CARE_PROVIDER_SITE_OTHER): Payer: No Typology Code available for payment source | Admitting: *Deleted

## 2012-10-24 DIAGNOSIS — Z111 Encounter for screening for respiratory tuberculosis: Secondary | ICD-10-CM

## 2012-10-27 LAB — TB SKIN TEST: Induration: 0 mm

## 2012-11-02 ENCOUNTER — Ambulatory Visit: Payer: No Typology Code available for payment source | Admitting: Internal Medicine

## 2012-11-08 ENCOUNTER — Ambulatory Visit: Payer: No Typology Code available for payment source | Admitting: Internal Medicine

## 2012-11-23 ENCOUNTER — Other Ambulatory Visit: Payer: Self-pay | Admitting: Internal Medicine

## 2012-11-23 DIAGNOSIS — N6310 Unspecified lump in the right breast, unspecified quadrant: Secondary | ICD-10-CM

## 2012-12-27 ENCOUNTER — Encounter: Payer: Self-pay | Admitting: Internal Medicine

## 2012-12-27 DIAGNOSIS — R269 Unspecified abnormalities of gait and mobility: Secondary | ICD-10-CM | POA: Insufficient documentation

## 2013-01-02 ENCOUNTER — Other Ambulatory Visit: Payer: Self-pay | Admitting: Internal Medicine

## 2013-01-02 DIAGNOSIS — N6310 Unspecified lump in the right breast, unspecified quadrant: Secondary | ICD-10-CM

## 2013-01-08 ENCOUNTER — Other Ambulatory Visit: Payer: No Typology Code available for payment source

## 2013-01-09 ENCOUNTER — Encounter: Payer: Self-pay | Admitting: Gastroenterology

## 2013-01-09 ENCOUNTER — Other Ambulatory Visit: Payer: No Typology Code available for payment source

## 2013-01-18 ENCOUNTER — Other Ambulatory Visit: Payer: No Typology Code available for payment source

## 2013-01-24 ENCOUNTER — Encounter: Payer: Self-pay | Admitting: Internal Medicine

## 2013-01-24 ENCOUNTER — Ambulatory Visit (INDEPENDENT_AMBULATORY_CARE_PROVIDER_SITE_OTHER): Payer: No Typology Code available for payment source | Admitting: Internal Medicine

## 2013-01-24 VITALS — BP 134/90 | HR 76 | Temp 98.7°F | Ht 65.0 in | Wt 176.5 lb

## 2013-01-24 DIAGNOSIS — M545 Low back pain, unspecified: Secondary | ICD-10-CM

## 2013-01-24 DIAGNOSIS — E890 Postprocedural hypothyroidism: Secondary | ICD-10-CM

## 2013-01-24 DIAGNOSIS — G8929 Other chronic pain: Secondary | ICD-10-CM | POA: Insufficient documentation

## 2013-01-24 MED ORDER — LEVOTHYROXINE SODIUM 100 MCG PO TABS: 100.0000 ug | ORAL_TABLET | Freq: Every day | ORAL | Status: DC

## 2013-01-24 MED ORDER — MELOXICAM 7.5 MG PO TABS: 7.5000 mg | ORAL_TABLET | Freq: Every day | ORAL | Status: DC

## 2013-01-24 NOTE — Assessment & Plan Note (Addendum)
Last TSH elevated, patient continued on her previous dose of 62.5 mcg as she did not follow up with the clinic after the last visit. Patient has dry mouth, possibly from hypothyroidism. Will treat with increased synthroid dose and follow up in 6 weeks.  -New Rx for 100mg  synthroid given to patient, dose calculated using patients weight and labs. -Will need repeat TSH in 6 weeks

## 2013-01-24 NOTE — Assessment & Plan Note (Addendum)
Patient presents with 2 weeks of low back pain localized to lumbar spine consistent with lumbosacral strain, no red flag symptoms. She has tried Aleve and Tylenol with incomplete relief. Patient continues to walk regularly. She has had difficulty sleeping because of the pain.  -Encouraged patient to Continue walking program as tolerated, staying active and strengthening muscles can help -MOBIC 7.5 mg daily for 15 days to treat pain, discussed how this is short-term treatment only. Avoid using other NSAIDs while on Mobic.

## 2013-01-24 NOTE — Patient Instructions (Addendum)
Please return to clinic in 6 weeks to get labs drawn (to check thyroid)  Make an appointment with Dr. Anselm Jungling in 7-8 weeks to follow up on labs.  Your medications will be called into the health department.  Thanks for coming in today!

## 2013-01-24 NOTE — Progress Notes (Signed)
Patient ID: Amy [name], female   DOB: 05-20-1966, 47 y.o.   MRN: [id number] Internal Medicine Clinic Visit    HPI:  Amy [name] is a 47 y.o. year old female with a history of hypothyroidism, fibromyalgia, depression, hypertension, who presents to clinic to address her thyroid issues as well as a new complaint of low back pain.  LBP: Left sided back pain, radiates down to the thigh but only when laying flat. Feels like sharp, nagging pain, also throbbing.  About 1 months duration, getting worse. Radiates to thigh. Hard to get comfortable in the bed without pain and so she has been sleeping less than usual, getting about 4.5 hours of sleep, had to sleep in recliner last night.  Has tried aleve, advil, tylenol, which help a little but do not alleviate the pain enough to help her sleep. Has noticed muscle spasms as well. No heavy lifting or trauma preceding the back pain. No previous problems with her back, no back surgeries.  Patient states that she is also quite fatigued during the day. She thinks its because she isn't getting enough sleep at night. Going on for the past 2-3 weeks. She also c/o dry mouth, which has been going on for over a month, she feels it is getting worse. Denies dry eye symptoms.   Currently, she is taking 1/2 tab of synthroid daily, rarely misses doses. Denies constipation, edema, rashes. She does endorse dry skin.  Continues to take amitriptyline and states that this medicine has helped greatly. She denies any depression symptoms.     Past Medical History  Diagnosis Date  . Depression   . Grave's disease 2007    TSH (08/31/2010) = 0.186 (low), free T4 = 0.91 (WNL)  . Raynaud disease 2007  . Anxiety   . Hypertension   . Postsurgical hypothyroidism     Past Surgical History  Procedure Laterality Date  . Colonoscopy  02/14/2008    done by Dr. Christella Hartigan - small colonic polyp identified, 6 mm in size and per biopsy --> tubular adenoma with no malignancy or  high grade dysplasia noted  . Abdominal hysterectomy    . Thyroidectomy  03/08/2012    [facility]; history of Graves' disease with multinodular goiter     ROS:  A complete review of systems was otherwise negative, except as noted in the HPI.  Allergies: Review of patient's allergies indicates no known allergies.  Medications: Current Outpatient Prescriptions  Medication Sig Dispense Refill  . amitriptyline (ELAVIL) 100 MG tablet Take 1 tablet (100 mg total) by mouth at bedtime.  30 tablet  6  . esomeprazole (NEXIUM) 40 MG capsule Take 1 capsule (40 mg total) by mouth daily.  90 capsule  4  . hydrochlorothiazide (HYDRODIURIL) 25 MG tablet Take 1 tablet (25 mg total) by mouth daily.  30 tablet  7  . levothyroxine (SYNTHROID) 88 MCG tablet Take 1 tablet (88 mcg total) by mouth daily.  30 tablet  3  . norethindrone-ethinyl estradiol 1/35 (ORTHO-NOVUM 1/35, 28,) tablet Take 1 tablet by mouth daily.  1 Package  11  . propranolol ER (INDERAL LA) 80 MG 24 hr capsule Take 1 capsule (80 mg total) by mouth daily.  30 capsule  7   No current facility-administered medications for this visit.    History   Social History  . Marital Status: Single    Spouse Name: N/A    Number of Children: N/A  . Years of Education: N/A   Occupational  History  . Not on file.   Social History Main Topics  . Smoking status: Current Every Day Smoker -- 0.30 packs/day    Types: Cigarettes    Last Attempt to Quit: 10/13/1996  . Smokeless tobacco: Not on file  . Alcohol Use: No     Comment: occasional  . Drug Use: No  . Sexually Active: No   Other Topics Concern  . Not on file   Social History Narrative   Financial assistance approved for 80% discount at [facility] and has [facility] card per Rudell Cobb   07/24/2010          family history includes Colon cancer in her maternal grandfather; Hypertension in her father and mother; and Thyroid disease in her mother.  Physical Exam Last menstrual period  01/26/2000. General:  No acute distress, alert and oriented x 3, well-appearing AAF HEENT:  PERRL, EOMI, no lymphadenopathy, moist mucous membranes, oropharynx clear, no LAD, midline horizontal scar noted over anterior neck Cardiovascular:  Regular rate and rhythm, no murmurs, rubs or gallops Respiratory:  Clear to auscultation bilaterally, no wheezes, rales, or rhonchi Abdomen:  Soft, nondistended, nontender, +bowel sounds Extremities:  Warm and well-perfused, no pretibial edema Skin: Warm, not excessively dry, no rashes MSK: minimal tenderness to palpation of lumbar spine and paraspinal muscles, SI joint is somewhat tender to touch, no point tenderness, no overlying erythema or rashes. Sensation to light touch is intact throughout upper and lower extremities, strength is 5/5 symmetrical, no atrophy. Neuro: Not anxious appearing, no depressed mood, normal affect  Labs: Lab Results  Component Value Date   CREATININE 0.90 08/24/2012   BUN 9 08/24/2012   NA 141 08/24/2012   K 3.7 08/24/2012   CL 103 08/24/2012   CO2 31 08/24/2012   Lab Results  Component Value Date   WBC 7.8 10/14/2011   HGB 12.6 10/14/2011   HCT 38.0 10/14/2011   MCV 91.8 10/14/2011   PLT 225 10/14/2011      Assessment and Plan:    FOLLOWUP: Amy [name] will follow back up in our clinic in approximately 6 weeks. Amy [name] knows to call out clinic in the meantime with any questions or new issues.   Plan was discussed with Dr. Kem Kays

## 2013-01-26 NOTE — Progress Notes (Signed)
Case discussed with Dr. Kesty immediately after the resident saw the patient. We reviewed the resident's history and exam and pertinent patient test results. I agree with the assessment, diagnosis and plan of care documented in the resident's note. 

## 2013-01-31 ENCOUNTER — Other Ambulatory Visit: Payer: No Typology Code available for payment source

## 2013-02-14 ENCOUNTER — Other Ambulatory Visit: Payer: No Typology Code available for payment source

## 2013-03-07 ENCOUNTER — Ambulatory Visit: Payer: Self-pay

## 2013-03-13 ENCOUNTER — Ambulatory Visit: Payer: Self-pay

## 2013-03-14 ENCOUNTER — Ambulatory Visit
Admission: RE | Admit: 2013-03-14 | Discharge: 2013-03-14 | Disposition: A | Discharge status: Home / Self Care | Payer: No Typology Code available for payment source | Source: Ambulatory Visit | Attending: Internal Medicine | Admitting: Internal Medicine

## 2013-03-14 DIAGNOSIS — N6310 Unspecified lump in the right breast, unspecified quadrant: Secondary | ICD-10-CM

## 2013-03-16 ENCOUNTER — Ambulatory Visit: Payer: Self-pay

## 2013-03-20 ENCOUNTER — Ambulatory Visit: Payer: Self-pay

## 2013-03-29 NOTE — Addendum Note (Signed)
Addended by: Remus Blake on: 03/29/2013 09:16 AM   Modules accepted: Orders

## 2013-04-25 ENCOUNTER — Ambulatory Visit: Payer: Self-pay

## 2013-05-01 ENCOUNTER — Ambulatory Visit: Payer: Self-pay

## 2013-05-09 ENCOUNTER — Encounter: Payer: Self-pay | Admitting: Internal Medicine

## 2013-05-09 ENCOUNTER — Ambulatory Visit: Payer: Self-pay | Admitting: Internal Medicine

## 2013-05-21 ENCOUNTER — Inpatient Hospital Stay (HOSPITAL_COMMUNITY)
Admission: AD | Admit: 2013-05-21 | Discharge: 2013-05-21 | Disposition: A | Discharge status: Home / Self Care | Payer: No Typology Code available for payment source | Source: Ambulatory Visit | Attending: Obstetrics and Gynecology | Admitting: Obstetrics and Gynecology

## 2013-05-21 ENCOUNTER — Encounter (HOSPITAL_COMMUNITY): Payer: Self-pay | Admitting: *Deleted

## 2013-05-21 DIAGNOSIS — B379 Candidiasis, unspecified: Secondary | ICD-10-CM

## 2013-05-21 DIAGNOSIS — L293 Anogenital pruritus, unspecified: Secondary | ICD-10-CM | POA: Insufficient documentation

## 2013-05-21 MED ORDER — FLUCONAZOLE 150 MG PO TABS: 150.0000 mg | ORAL_TABLET | Freq: Once | ORAL | Status: DC

## 2013-05-21 MED ORDER — FLUCONAZOLE 150 MG PO TABS
150.0000 mg | ORAL_TABLET | Freq: Once | ORAL | Status: AC
  Administered 2013-05-21: 150 mg via ORAL
  Filled 2013-05-21: qty 1

## 2013-05-21 NOTE — MAU Note (Signed)
Started using new body lotion 2 wks ago and started with groin and labial itching one wk ago.  Used hydrocortizone and vagisil cream without any relief.   Also reports changing laundry detergent.  Denies any discharge or odor or internal vaginal itching.

## 2013-05-21 NOTE — MAU Provider Note (Signed)
None     Chief Complaint:  Vaginal Itching She began having vulvar/groin itching a few days ago.  Started using new body lotion 2 wks ago and started with groin and labial itching one wk ago. Used hydrocortizone and vagisil cream without any relief. Also reports changing laundry detergent. Denies any discharge or odor or internal vaginal itching.   Amy [name] is  47 y.o. [id number].  Patient's last menstrual period was 01/26/2000.Marland Kitchen   Past Medical History  Diagnosis Date  . Depression   . Grave's disease 2007    TSH (08/31/2010) = 0.186 (low), free T4 = 0.91 (WNL)  . Anxiety   . Hypertension   . Postsurgical hypothyroidism   . Raynaud disease 2007    Past Surgical History  Procedure Laterality Date  . Colonoscopy  02/14/2008    done by Dr. Christella Hartigan - small colonic polyp identified, 6 mm in size and per biopsy --> tubular adenoma with no malignancy or high grade dysplasia noted  . Abdominal hysterectomy    . Thyroidectomy  03/08/2012    [facility]; history of Graves' disease with multinodular goiter  . Wisdom tooth extraction      Family History  Problem Relation Age of Onset  . Colon cancer Maternal Grandfather   . Cancer Maternal Grandfather   . Hypertension Mother   . Thyroid disease Mother   . Hypertension Father     History  Substance Use Topics  . Smoking status: Current Every Day Smoker -- 0.30 packs/day for 13 years    Types: Cigarettes    Last Attempt to Quit: 10/13/1996  . Smokeless tobacco: Not on file  . Alcohol Use: Yes     Comment: occasional    Allergies:  Allergies  Allergen Reactions  . Adhesive [Tape] Other (See Comments)    Pulls skin off.    Prescriptions prior to admission  Medication Sig Dispense Refill  . amitriptyline (ELAVIL) 100 MG tablet Take 1 tablet (100 mg total) by mouth at bedtime.  30 tablet  6  . esomeprazole (NEXIUM) 40 MG capsule Take 1 capsule (40 mg total) by mouth daily.  90 capsule  4  . hydrochlorothiazide  (HYDRODIURIL) 25 MG tablet Take 1 tablet (25 mg total) by mouth daily.  30 tablet  7  . levothyroxine (SYNTHROID) 100 MCG tablet Take 1 tablet (100 mcg total) by mouth daily.  30 tablet  4  . propranolol ER (INDERAL LA) 80 MG 24 hr capsule Take 1 capsule (80 mg total) by mouth daily.  30 capsule  7     Review of Systems   Constitutional: Negative for fever and chills Eyes: Negative for visual disturbances Respiratory: Negative for shortness of breath, dyspnea Cardiovascular: Negative for chest pain or palpitations  Gastrointestinal: Negative for vomiting, diarrhea and constipation Genitourinary: Negative for dysuria and urgency Musculoskeletal: Negative for back pain, joint pain, myalgias  Neurological: Negative for dizziness and headaches    Physical Exam   Blood pressure 130/87, pulse 95, temperature 97.5 F (36.4 C), temperature source Oral, resp. rate 18, weight 75.751 kg (167 lb), last menstrual period 01/26/2000.  General: General appearance - alert, well appearing, and in no distress Chest - clear to auscultation, no wheezes, rales or rhonchi, symmetric air entry Heart - normal rate and regular rhythm Abdomen - soft, nontender, nondistended, no masses or organomegaly Pelvic - yeast rash in groin.  SSE:  Thick white curdy discharge Extremities - no pedal edema noted   Labs: No results found for  this or any previous visit (from the past 24 hour(s)). Imaging Studies:  No results found.   Assessment: Patient Active Problem List   Diagnosis Date Noted  . Low back pain 01/24/2013  . Abnormality of gait 12/27/2012  . Health care maintenance 10/16/2012  . Hot flashes due to surgical menopause 02/08/2012  . Post-surgical hypothyroidism 12/07/2011  . Fibromyalgia muscle pain 03/31/2011  . Migraine 03/31/2011  . HYPERTENSION 11/14/2008  . DEPRESSION 08/10/2006    Plan: Diflucan  Amy [name],Amy [name]

## 2013-05-21 NOTE — MAU Note (Addendum)
Vag itching started about a wk ago, external

## 2013-05-22 NOTE — MAU Provider Note (Signed)
Attestation of Attending Supervision of Advanced Practitioner (CNM/NP): Evaluation and management procedures were performed by the Advanced Practitioner under my supervision and collaboration.  I have reviewed the Advanced Practitioner's note and chart, and I agree with the management and plan.  Honora Searson 05/22/2013 8:09 AM   

## 2013-05-23 ENCOUNTER — Other Ambulatory Visit: Payer: Self-pay | Admitting: *Deleted

## 2013-05-23 DIAGNOSIS — I10 Essential (primary) hypertension: Secondary | ICD-10-CM

## 2013-05-25 MED ORDER — AMITRIPTYLINE HCL 100 MG PO TABS: 100.0000 mg | ORAL_TABLET | Freq: Every day | ORAL | Status: DC

## 2013-05-25 MED ORDER — PROPRANOLOL HCL ER 80 MG PO CP24: 80.0000 mg | ORAL_CAPSULE | Freq: Every day | ORAL | Status: DC

## 2013-05-29 NOTE — Telephone Encounter (Signed)
Called to pharm 

## 2013-06-14 ENCOUNTER — Ambulatory Visit: Payer: No Typology Code available for payment source

## 2013-07-02 ENCOUNTER — Ambulatory Visit (INDEPENDENT_AMBULATORY_CARE_PROVIDER_SITE_OTHER): Payer: No Typology Code available for payment source | Admitting: Internal Medicine

## 2013-07-02 ENCOUNTER — Encounter: Payer: Self-pay | Admitting: Internal Medicine

## 2013-07-02 ENCOUNTER — Ambulatory Visit (HOSPITAL_COMMUNITY)
Admission: RE | Admit: 2013-07-02 | Discharge: 2013-07-02 | Disposition: A | Discharge status: Home / Self Care | Payer: No Typology Code available for payment source | Source: Ambulatory Visit | Attending: Internal Medicine | Admitting: Internal Medicine

## 2013-07-02 VITALS — BP 155/99 | HR 93 | Temp 98.2°F | Wt 169.8 lb

## 2013-07-02 DIAGNOSIS — E785 Hyperlipidemia, unspecified: Secondary | ICD-10-CM

## 2013-07-02 DIAGNOSIS — R232 Flushing: Secondary | ICD-10-CM

## 2013-07-02 DIAGNOSIS — E039 Hypothyroidism, unspecified: Secondary | ICD-10-CM

## 2013-07-02 DIAGNOSIS — R079 Chest pain, unspecified: Secondary | ICD-10-CM | POA: Insufficient documentation

## 2013-07-02 DIAGNOSIS — N951 Menopausal and female climacteric states: Secondary | ICD-10-CM

## 2013-07-02 DIAGNOSIS — E890 Postprocedural hypothyroidism: Secondary | ICD-10-CM

## 2013-07-02 DIAGNOSIS — M797 Fibromyalgia: Secondary | ICD-10-CM

## 2013-07-02 DIAGNOSIS — IMO0001 Reserved for inherently not codable concepts without codable children: Secondary | ICD-10-CM

## 2013-07-02 DIAGNOSIS — Z23 Encounter for immunization: Secondary | ICD-10-CM

## 2013-07-02 DIAGNOSIS — I10 Essential (primary) hypertension: Secondary | ICD-10-CM

## 2013-07-02 DIAGNOSIS — Z0000 Encounter for general adult medical examination without abnormal findings: Secondary | ICD-10-CM

## 2013-07-02 LAB — CBC
MCH: 29.7 pg (ref 26.0–34.0)
MCV: 89.4 fL (ref 78.0–100.0)
Platelets: 261 10*3/uL (ref 150–400)
RBC: 4.24 MIL/uL (ref 3.87–5.11)
RDW: 12.4 % (ref 11.5–15.5)

## 2013-07-02 MED ORDER — ASPIRIN 81 MG PO TABS: 81.0000 mg | ORAL_TABLET | Freq: Every day | ORAL | Status: DC

## 2013-07-02 MED ORDER — HYDROCHLOROTHIAZIDE 25 MG PO TABS: 25.0000 mg | ORAL_TABLET | Freq: Every day | ORAL | Status: DC

## 2013-07-02 NOTE — Assessment & Plan Note (Addendum)
She did not complain of hot flashes today but her thyroid disease could explain heat (with iatrogenic hyperthyroidism) and cold intolerance with bloating with hypothyroidism.

## 2013-07-02 NOTE — Assessment & Plan Note (Signed)
She has chest pain under her rib cage bilaterally that is reproducible with bilateral upper quadrant deep palpation. She takes Nexium for GERD and does not think this pain is similar to acid reflux/heart burn. She has had this pain before that subsides on its own and was described as "gas pain".  Her EKG is normal with no signs of cardiac ischemia, no ST changes. Her pain is not associated with diaphoresis or SOB, numbness of her arms or jaw. The pain is sharp and lasts for seconds at a time. Her symptoms and physical findings do not suggest cardiac etiology for her pain.   Her BP is elevated with mild discrepancy from her right arm to her left arm but she denies back pain making aortic dissection less likely.   -Will continue monitoring.  -Pt to start ASA 81mg  daily.  -Pt advised to go to the ED if her pain is persistent or if it worsens. She agreed and understood.

## 2013-07-02 NOTE — Assessment & Plan Note (Signed)
BP Readings from Last 3 Encounters:  07/02/13 155/99  05/21/13 137/98  01/24/13 134/90    Lab Results  Component Value Date   NA 141 08/24/2012   K 3.7 08/24/2012   CREATININE 0.90 08/24/2012    Assessment: Blood pressure control: moderately elevated Progress toward BP goal:  deteriorated Comments: She has been taking propanolol 80mg  daily but has not been taking HCTZ in months, she is not sure why she stopped taking this medication. Her blood pressure is elevated today with discrepancy of 155/99 in her left arm and 166/102 in her right arm.    Plan: Medications:  continue current medications and restart HCTZ 25mg  daily.  Educational resources provided: brochure Self management tools provided: home blood pressure logbook Other plans: She will follow up in 2-3 weeks.

## 2013-07-02 NOTE — Progress Notes (Signed)
  Subjective:    Patient ID: Amy [name], female    DOB: 1966-05-28, 47 y.o.   MRN: [id number]  Chest Pain  Pertinent negatives include no abdominal pain, back pain, cough, diaphoresis, dizziness, fever, headaches, nausea, numbness, palpitations, shortness of breath or vomiting.   Amy [name] is a 47 year old woman with PMH of HTN, migraines, and post-surgical hypothyroidism who presents for evaluation of chest pain. She states that since last nigh she has had sharp pain under her bilateral rib cage. The pain is present for seconds and subsides on its own. The pain is not associated with shortness of breath, diaphoresis, nausea, back pain, cough, or left arm or jaw numbness or tingling. The pain is present while she is sitting up of lying down, nothing seems to provoke or alleviate the pain. She has had this sharp pain before that subsided on its own and was attributable to "gas pain". She denies constipation but states that she has a bowel movement every 2-3 days. Of note, she has been taking synthroid since her last visit but is uncertain on the current dose she is taking. She has also stopped taking HCTZ but is not sure why she stopped taking this medication.    Review of Systems  Constitutional: Negative for fever, chills, diaphoresis, activity change, appetite change, fatigue and unexpected weight change.  Respiratory: Negative for cough, shortness of breath and wheezing.   Cardiovascular: Positive for chest pain. Negative for palpitations and leg swelling.  Gastrointestinal: Negative for nausea, vomiting, abdominal pain, diarrhea, constipation and blood in stool.       Bloating   Genitourinary: Negative for dysuria.  Musculoskeletal: Negative for back pain.  Skin: Negative for color change, pallor and rash.  Neurological: Negative for dizziness, light-headedness, numbness and headaches.  Hematological: Negative for adenopathy. Does not bruise/bleed easily.  Psychiatric/Behavioral:  Negative for behavioral problems, confusion and agitation.       Objective:   Physical Exam  Nursing note and vitals reviewed. Constitutional: She is oriented to person, place, and time. She appears well-developed and well-nourished. No distress.  Neck: No thyromegaly present.  Cardiovascular: Normal rate and regular rhythm.   Murmur heard. Soft 2/6 SEM heard best at the RUSB, as noted in previous visits.   Pulmonary/Chest: Effort normal and breath sounds normal. No respiratory distress. She has no wheezes. She has no rales. She exhibits no tenderness.  Abdominal: Soft. She exhibits no distension and no mass. There is tenderness. There is no rebound and no guarding.  Hypoactive bowel sounds, R and L upper quadrant tenderness with deep palpation that reproduces the chest pain.   Genitourinary:  No suprapubic tenderness  Musculoskeletal: She exhibits no edema and no tenderness.  Neurological: She is alert and oriented to person, place, and time. Coordination normal.  Skin: Skin is warm and dry. No rash noted. She is not diaphoretic. No erythema. No pallor.  Psychiatric: She has a normal mood and affect. Her behavior is normal.          Assessment & Plan:

## 2013-07-02 NOTE — Assessment & Plan Note (Addendum)
Unfortunately she did not follow up with for repeat TSH since May 2014.  Repeat TSH, CBC, BMET, and lipid profile during this visit.  She is not sure if she is taking synthroid daily, she will bring her medications with her during her next visit.

## 2013-07-02 NOTE — Patient Instructions (Signed)
General Instructions: -Start taking hydrochlorothiazide 25 mg daily for your blood pressure.  -Start taking a baby aspirin per day, ASA 81mg .  -You may take Colace for stool softner and increase fiber in your diet.  -Go to the ED if your chest pain worsens, if you have sweating, or difficulty breathing.  -We will call you if your synthroid needs to be adjusted.  -Follow up with [place] in 2-3 weeks.    Treatment Goals:  Goals (1 Years of Data) as of 07/02/13         As of Today 05/21/13 05/21/13 05/21/13 01/24/13     Blood Pressure    . Blood Pressure < 140/90  163/100 137/98 130/87 142/95 134/90     Lifestyle    . Prevent Falls            Progress Toward Treatment Goals:  Treatment Goal 07/02/2013  Blood pressure deteriorated  Stop smoking smoking the same amount    Self Care Goals & Plans:  Self Care Goal 07/02/2013  Manage my medications take my medicines as prescribed; bring my medications to every visit; refill my medications on time  Eat healthy foods drink diet soda or water instead of juice or soda; eat foods that are low in salt; eat baked foods instead of fried foods  Be physically active find an activity I enjoy  Stop smoking go to the [employer] ([employer]); cut down the number of cigarettes smoked    No flowsheet data found.   Care Management & Community Referrals:  Referral 07/02/2013  Referrals made for care management support none needed

## 2013-07-02 NOTE — Assessment & Plan Note (Signed)
She states that her mother has fibromyalgia but she is not sure she has this dx. She is still taking amitriptyline but for her anxiety and would like to continue taking this medication.

## 2013-07-02 NOTE — Assessment & Plan Note (Signed)
She received the flu vaccine and the Pneumovax vaccine during this visit.

## 2013-07-03 LAB — BASIC METABOLIC PANEL WITH GFR
BUN: 11 mg/dL (ref 6–23)
CO2: 30 mEq/L (ref 19–32)
Calcium: 9.3 mg/dL (ref 8.4–10.5)
Creat: 0.66 mg/dL (ref 0.50–1.10)
Glucose, Bld: 81 mg/dL (ref 70–99)

## 2013-07-03 LAB — LIPID PANEL
HDL: 55 mg/dL (ref >39)
LDL Cholesterol: 104 mg/dL — ABNORMAL HIGH (ref 0–99)

## 2013-07-03 NOTE — Progress Notes (Signed)
Case discussed with Dr. Kennerly soon after the resident saw the patient.  We reviewed the resident's history and exam and pertinent patient test results.  I agree with the assessment, diagnosis, and plan of care documented in the resident's note. 

## 2013-07-30 ENCOUNTER — Encounter: Payer: No Typology Code available for payment source | Admitting: Internal Medicine

## 2013-07-30 ENCOUNTER — Encounter: Payer: Self-pay | Admitting: Internal Medicine

## 2013-08-22 ENCOUNTER — Other Ambulatory Visit: Payer: Self-pay | Admitting: *Deleted

## 2013-08-22 DIAGNOSIS — E890 Postprocedural hypothyroidism: Secondary | ICD-10-CM

## 2013-08-22 MED ORDER — LEVOTHYROXINE SODIUM 100 MCG PO TABS: 100.0000 ug | ORAL_TABLET | Freq: Every day | ORAL | Status: DC

## 2013-08-24 NOTE — Telephone Encounter (Signed)
Called to pharm 

## 2013-09-26 ENCOUNTER — Other Ambulatory Visit: Payer: Self-pay | Admitting: *Deleted

## 2013-09-27 MED ORDER — ESOMEPRAZOLE MAGNESIUM 40 MG PO CPDR: 40.0000 mg | DELAYED_RELEASE_CAPSULE | Freq: Every day | ORAL | Status: DC

## 2013-09-27 NOTE — Telephone Encounter (Signed)
Rx called in to pharmacy. 

## 2013-10-05 ENCOUNTER — Ambulatory Visit: Payer: Self-pay

## 2013-10-06 ENCOUNTER — Encounter (HOSPITAL_COMMUNITY): Payer: Self-pay | Admitting: Emergency Medicine

## 2013-10-06 ENCOUNTER — Emergency Department (INDEPENDENT_AMBULATORY_CARE_PROVIDER_SITE_OTHER)
Admission: EM | Admit: 2013-10-06 | Discharge: 2013-10-06 | Disposition: A | Discharge status: Home / Self Care | Payer: Self-pay | Source: Home / Self Care

## 2013-10-06 DIAGNOSIS — R519 Headache, unspecified: Secondary | ICD-10-CM

## 2013-10-06 DIAGNOSIS — R51 Headache: Secondary | ICD-10-CM

## 2013-10-06 DIAGNOSIS — L259 Unspecified contact dermatitis, unspecified cause: Secondary | ICD-10-CM

## 2013-10-06 MED ORDER — DIPHENHYDRAMINE HCL 50 MG/ML IJ SOLN
INTRAMUSCULAR | Status: AC
  Filled 2013-10-06: qty 1

## 2013-10-06 MED ORDER — ONDANSETRON 4 MG PO TBDP
ORAL_TABLET | ORAL | Status: AC
  Filled 2013-10-06: qty 1

## 2013-10-06 MED ORDER — KETOROLAC TROMETHAMINE 60 MG/2ML IM SOLN
INTRAMUSCULAR | Status: AC
  Filled 2013-10-06: qty 2

## 2013-10-06 MED ORDER — ONDANSETRON 4 MG PO TBDP
4.0000 mg | ORAL_TABLET | Freq: Once | ORAL | Status: AC
Start: 2013-10-06 — End: 2013-10-06
  Administered 2013-10-06: 4 mg via ORAL

## 2013-10-06 MED ORDER — TRIAMCINOLONE ACETONIDE 0.1 % EX CREA: 1.0000 "application " | TOPICAL_CREAM | Freq: Two times a day (BID) | CUTANEOUS | Status: DC

## 2013-10-06 MED ORDER — KETOROLAC TROMETHAMINE 60 MG/2ML IM SOLN
60.0000 mg | Freq: Once | INTRAMUSCULAR | Status: AC
Start: 2013-10-06 — End: 2013-10-06
  Administered 2013-10-06: 60 mg via INTRAMUSCULAR

## 2013-10-06 MED ORDER — DIPHENHYDRAMINE HCL 50 MG/ML IJ SOLN
50.0000 mg | Freq: Once | INTRAMUSCULAR | Status: AC
  Administered 2013-10-06: 50 mg via INTRAMUSCULAR

## 2013-10-06 MED ORDER — METHYLPREDNISOLONE 4 MG PO KIT: PACK | ORAL | Status: DC

## 2013-10-06 MED ORDER — ONDANSETRON HCL 4 MG PO TABS: 4.0000 mg | ORAL_TABLET | Freq: Four times a day (QID) | ORAL | Status: DC

## 2013-10-06 NOTE — ED Provider Notes (Signed)
Medical screening examination/treatment/procedure(s) were performed by non-physician practitioner and as supervising physician I was immediately available for consultation/collaboration.  Brocha Gilliam, M.D.  Mirko Tailor C Kiarrah Rausch, MD 10/06/13 2219 

## 2013-10-06 NOTE — ED Provider Notes (Signed)
CSN: [id number]     Arrival date & time 10/06/13  0941 History   First MD Initiated Contact with Patient 10/06/13 [phone]     Chief Complaint  Patient presents with  . Headache   (Consider location/radiation/quality/duration/timing/severity/associated sxs/prior Treatment) HPI Comments: 48 year old female with a history of hypertension, fibromyalgia, high powered thyroidism and migraine headaches presents with a complaint of a typical migraine headache for her. She describes a throbbing pain in the right frontoparietal area and is associated with nausea without vomiting and photophobia.  Also complains of a rash to the face and neck where she recently applied a new type of soap. It burns and itches.   Past Medical History  Diagnosis Date  . Depression   . Grave's disease 2007    TSH (08/31/2010) = 0.186 (low), free T4 = 0.91 (WNL)  . Anxiety   . Hypertension   . Postsurgical hypothyroidism   . Raynaud disease 2007   Past Surgical History  Procedure Laterality Date  . Colonoscopy  02/14/2008    done by Dr. Christella HartiganJacobs - small colonic polyp identified, 6 mm in size and per biopsy --> tubular adenoma with no malignancy or high grade dysplasia noted  . Abdominal hysterectomy    . [facility]; history of Graves' disease with multinodular goiter  . Wisdom tooth extraction     Family History  Problem Relation Age of Onset  . Colon cancer Maternal Grandfather   . Cancer Maternal Grandfather   . Hypertension Mother   . Thyroid disease Mother   . Hypertension Father   . Lupus Mother    History  Substance Use Topics  . Smoking status: Current Every Day Smoker -- 0.30 packs/day for 13 years    Types: Cigarettes    Last Attempt to Quit: 10/13/1996  . Smokeless tobacco: Not on file  . Alcohol Use: Yes     Comment: occasional   OB History   Grav Para Term Preterm Abortions TAB SAB Ect Mult Living   3 3 2 1      2      Review of Systems  Constitutional:  Negative.   Respiratory: Negative.   Cardiovascular: Negative.   Gastrointestinal: Positive for nausea. Negative for vomiting.  Genitourinary: Negative.   Musculoskeletal: Negative for back pain, gait problem, neck pain and neck stiffness.  Neurological: Positive for headaches. Negative for dizziness, tremors, seizures, syncope, facial asymmetry, speech difficulty, weakness and numbness.  Hematological: Negative.     Allergies  Adhesive  Home Medications   Current Outpatient Rx  Name  Route  Sig  Dispense  Refill  . amitriptyline (ELAVIL) 100 MG tablet   Oral   Take 1 tablet (100 mg total) by mouth at bedtime.   30 tablet   6   . aspirin 81 MG tablet   Oral   Take 1 tablet (81 mg total) by mouth daily.   30 tablet      . esomeprazole (NEXIUM) 40 MG capsule   Oral   Take 1 capsule (40 mg total) by mouth daily.   90 capsule   4   . hydrochlorothiazide (HYDRODIURIL) 25 MG tablet   Oral   Take 1 tablet (25 mg total) by mouth daily.   30 tablet   7   . levothyroxine (SYNTHROID, LEVOTHROID) 100 MCG tablet   Oral   Take 1 tablet (100 mcg total) by mouth daily.   90 tablet   4   . methylPREDNISolone (MEDROL  DOSEPAK) 4 MG tablet      follow package directions   21 tablet   0   . ondansetron (ZOFRAN) 4 MG tablet   Oral   Take 1 tablet (4 mg total) by mouth every 6 (six) hours. As needed for nausea   12 tablet   0   . propranolol ER (INDERAL LA) 80 MG 24 hr capsule   Oral   Take 1 capsule (80 mg total) by mouth daily.   90 capsule   4   . triamcinolone cream (KENALOG) 0.1 %   Topical   Apply 1 application topically 2 (two) times daily. May use on face   30 g   0    BP 118/80  Pulse 95  Temp(Src) 98 F (36.7 C) (Oral)  Resp 19  SpO2 98%  LMP 01/26/2000 Physical Exam  Nursing note and vitals reviewed. Constitutional: She is oriented to person, place, and time. She appears well-developed and well-nourished. No distress.  HENT:  Head: Normocephalic  and atraumatic.  Mouth/Throat: Oropharynx is clear and moist. No oropharyngeal exudate.  Eyes: Conjunctivae and EOM are normal. Pupils are equal, round, and reactive to light.  Neck: Normal range of motion. Neck supple.  Cardiovascular: Normal rate and normal heart sounds.   Pulmonary/Chest: Effort normal and breath sounds normal. No respiratory distress.  Musculoskeletal: Normal range of motion. She exhibits no edema and no tenderness.  Lymphadenopathy:    She has no cervical adenopathy.  Neurological: She is alert and oriented to person, place, and time. She has normal strength. She displays no tremor. No cranial nerve deficit or sensory deficit. She exhibits normal muscle tone. She displays a negative Romberg sign. Coordination and gait normal. GCS eye subscore is 4. GCS verbal subscore is 5. GCS motor subscore is 6.  Skin: Skin is warm and dry.  Psychiatric: She has a normal mood and affect.    ED Course  Procedures (including critical care time) Labs Review Labs Reviewed - No data to display Imaging Review No results found.    MDM   1. Headache in front of head   2. Contact dermatitis       Zofran 4 mg by mouth Toradol 60 mg IM Benadryl 50 mg IM Rx: Triamcinolone cream apply to face twice a day when necessary rash Medrol Dosepak. Patient states that she was to compare which one is cheaper. Zofran 4 mg tablets per Rx. See her PCP in 2 days as scheduled.  Hayden Rasmussen, NP 10/06/13 1020

## 2013-10-06 NOTE — ED Notes (Signed)
Headache w photophobia; skin irritation, using new soap on face only, rash same

## 2013-10-06 NOTE — Discharge Instructions (Signed)
Contact Dermatitis °Contact dermatitis is a reaction to certain substances that touch the skin. Contact dermatitis can be either irritant contact dermatitis or allergic contact dermatitis. Irritant contact dermatitis does not require previous exposure to the substance for a reaction to occur. Allergic contact dermatitis only occurs if you have been exposed to the substance before. Upon a repeat exposure, your body reacts to the substance.  °CAUSES  °Many substances can cause contact dermatitis. Irritant dermatitis is most commonly caused by repeated exposure to mildly irritating substances, such as: °· Makeup. °· Soaps. °· Detergents. °· Bleaches. °· Acids. °· Metal salts, such as nickel. °Allergic contact dermatitis is most commonly caused by exposure to: °· Poisonous plants. °· Chemicals (deodorants, shampoos). °· Jewelry. °· Latex. °· Neomycin in triple antibiotic cream. °· Preservatives in products, including clothing. °SYMPTOMS  °The area of skin that is exposed may develop: °· Dryness or flaking. °· Redness. °· Cracks. °· Itching. °· Pain or a burning sensation. °· Blisters. °With allergic contact dermatitis, there may also be swelling in areas such as the eyelids, mouth, or genitals.  °DIAGNOSIS  °Your caregiver can usually tell what the problem is by doing a physical exam. In cases where the cause is uncertain and an allergic contact dermatitis is suspected, a patch skin test may be performed to help determine the cause of your dermatitis. °TREATMENT °Treatment includes protecting the skin from further contact with the irritating substance by avoiding that substance if possible. Barrier creams, powders, and gloves may be helpful. Your caregiver may also recommend: °· Steroid creams or ointments applied 2 times daily. For best results, soak the rash area in cool water for 20 minutes. Then apply the medicine. Cover the area with a plastic wrap. You can store the steroid cream in the refrigerator for a "chilly"  effect on your rash. That may decrease itching. Oral steroid medicines may be needed in more severe cases. °· Antibiotics or antibacterial ointments if a skin infection is present. °· Antihistamine lotion or an antihistamine taken by mouth to ease itching. °· Lubricants to keep moisture in your skin. °· Burow's solution to reduce redness and soreness or to dry a weeping rash. Mix one packet or tablet of solution in 2 cups cool water. Dip a clean washcloth in the mixture, wring it out a bit, and put it on the affected area. Leave the cloth in place for 30 minutes. Do this as often as possible throughout the day. °· Taking several cornstarch or baking soda baths daily if the area is too large to cover with a washcloth. °Harsh chemicals, such as alkalis or acids, can cause skin damage that is like a burn. You should flush your skin for 15 to 20 minutes with cold water after such an exposure. You should also seek immediate medical care after exposure. Bandages (dressings), antibiotics, and pain medicine may be needed for severely irritated skin.  °HOME CARE INSTRUCTIONS °· Avoid the substance that caused your reaction. °· Keep the area of skin that is affected away from hot water, soap, sunlight, chemicals, acidic substances, or anything else that would irritate your skin. °· Do not scratch the rash. Scratching may cause the rash to become infected. °· You may take cool baths to help stop the itching. °· Only take over-the-counter or prescription medicines as directed by your caregiver. °· See your caregiver for follow-up care as directed to make sure your skin is healing properly. °SEEK MEDICAL CARE IF:  °· Your condition is not better after 3   days of treatment.  You seem to be getting worse.  You see signs of infection such as swelling, tenderness, redness, soreness, or warmth in the affected area.  You have any problems related to your medicines. Document Released: 09/03/2000 Document Revised: 11/29/2011  Document Reviewed: 02/09/2011 [facility] Patient Information 2014 [place], [place].  Migraine Headache A migraine headache is very bad, throbbing pain on one or both sides of your head. Talk to your doctor about what things may bring on (trigger) your migraine headaches. HOME CARE  Only take medicines as told by your doctor.  Lie down in a dark, quiet room when you have a migraine.  Keep a journal to find out if certain things bring on migraine headaches. For example, write down:  What you eat and drink.  How much sleep you get.  Any change to your diet or medicines.  Lessen how much alcohol you drink.  Quit smoking if you smoke.  Get enough sleep.  Lessen any stress in your life.  Keep lights dim if bright lights bother you or make your migraines worse. GET HELP RIGHT AWAY IF:   Your migraine becomes really bad.  You have a fever.  You have a stiff neck.  You have trouble seeing.  Your muscles are weak, or you lose muscle control.  You lose your balance or have trouble walking.  You feel like you will pass out (faint), or you pass out.  You have really bad symptoms that are different than your first symptoms. MAKE SURE YOU:   Understand these instructions.  Will watch your condition.  Will get help right away if you are not doing well or get worse. Document Released: 06/15/2008 Document Revised: 11/29/2011 Document Reviewed: 05/14/2013 [facility] Patient Information 2014 [place], [place].  Headaches, Frequently Asked Questions MIGRAINE HEADACHES Q: What is migraine? What causes it? How can I treat it? A: Generally, migraine headaches begin as a dull ache. Then they develop into a constant, throbbing, and pulsating pain. You may experience pain at the temples. You may experience pain at the front or back of one or both sides of the head. The pain is usually accompanied by a combination of:  Nausea.  Vomiting.  Sensitivity to light and noise. Some people  (about 15%) experience an aura (see below) before an attack. The cause of migraine is believed to be chemical reactions in the brain. Treatment for migraine may include over-the-counter or prescription medications. It may also include self-help techniques. These include relaxation training and biofeedback.  Q: What is an aura? A: About 15% of people with migraine get an "aura". This is a sign of neurological symptoms that occur before a migraine headache. You may see wavy or jagged lines, dots, or flashing lights. You might experience tunnel vision or blind spots in one or both eyes. The aura can include visual or auditory hallucinations (something imagined). It may include disruptions in smell (such as strange odors), taste or touch. Other symptoms include:  Numbness.  A "pins and needles" sensation.  Difficulty in recalling or speaking the correct word. These neurological events may last as long as 60 minutes. These symptoms will fade as the headache begins. Q: What is a trigger? A: Certain physical or environmental factors can lead to or "trigger" a migraine. These include:  Foods.  Hormonal changes.  Weather.  Stress. It is important to remember that triggers are different for everyone. To help prevent migraine attacks, you need to figure out which triggers affect you. Keep a  headache diary. This is a good way to track triggers. The diary will help you talk to your healthcare professional about your condition. Q: Does weather affect migraines? A: Bright sunshine, hot, humid conditions, and drastic changes in barometric pressure may lead to, or "trigger," a migraine attack in some people. But studies have shown that weather does not act as a trigger for everyone with migraines. Q: What is the link between migraine and hormones? A: Hormones start and regulate many of your body's functions. Hormones keep your body in balance within a constantly changing environment. The levels of hormones in  your body are unbalanced at times. Examples are during menstruation, pregnancy, or menopause. That can lead to a migraine attack. In fact, about three quarters of all women with migraine report that their attacks are related to the menstrual cycle.  Q: Is there an increased risk of stroke for migraine sufferers? A: The likelihood of a migraine attack causing a stroke is very remote. That is not to say that migraine sufferers cannot have a stroke associated with their migraines. In persons under age 50, the most common associated factor for stroke is migraine headache. But over the course of a person's normal life span, the occurrence of migraine headache may actually be associated with a reduced risk of dying from cerebrovascular disease due to stroke.  Q: What are acute medications for migraine? A: Acute medications are used to treat the pain of the headache after it has started. Examples over-the-counter medications, NSAIDs, ergots, and triptans.  Q: What are the triptans? A: Triptans are the newest class of abortive medications. They are specifically targeted to treat migraine. Triptans are vasoconstrictors. They moderate some chemical reactions in the brain. The triptans work on receptors in your brain. Triptans help to restore the balance of a neurotransmitter called serotonin. Fluctuations in levels of serotonin are thought to be a main cause of migraine.  Q: Are over-the-counter medications for migraine effective? A: Over-the-counter, or "OTC," medications may be effective in relieving mild to moderate pain and associated symptoms of migraine. But you should see your caregiver before beginning any treatment regimen for migraine.  Q: What are preventive medications for migraine? A: Preventive medications for migraine are sometimes referred to as "prophylactic" treatments. They are used to reduce the frequency, severity, and length of migraine attacks. Examples of preventive medications include  antiepileptic medications, antidepressants, beta-blockers, calcium channel blockers, and NSAIDs (nonsteroidal anti-inflammatory drugs). Q: Why are anticonvulsants used to treat migraine? A: During the past few years, there has been an increased interest in antiepileptic drugs for the prevention of migraine. They are sometimes referred to as "anticonvulsants". Both epilepsy and migraine may be caused by similar reactions in the brain.  Q: Why are antidepressants used to treat migraine? A: Antidepressants are typically used to treat people with depression. They may reduce migraine frequency by regulating chemical levels, such as serotonin, in the brain.  Q: What alternative therapies are used to treat migraine? A: The term "alternative therapies" is often used to describe treatments considered outside the scope of conventional Western medicine. Examples of alternative therapy include acupuncture, acupressure, and yoga. Another common alternative treatment is herbal therapy. Some herbs are believed to relieve headache pain. Always discuss alternative therapies with your caregiver before proceeding. Some herbal products contain arsenic and other toxins. TENSION HEADACHES Q: What is a tension-type headache? What causes it? How can I treat it? A: Tension-type headaches occur randomly. They are often the result of temporary stress, anxiety,  fatigue, or anger. Symptoms include soreness in your temples, a tightening band-like sensation around your head (a "vice-like" ache). Symptoms can also include a pulling feeling, pressure sensations, and contracting head and neck muscles. The headache begins in your forehead, temples, or the back of your head and neck. Treatment for tension-type headache may include over-the-counter or prescription medications. Treatment may also include self-help techniques such as relaxation training and biofeedback. CLUSTER HEADACHES Q: What is a cluster headache? What causes it? How can I  treat it? A: Cluster headache gets its name because the attacks come in groups. The pain arrives with little, if any, warning. It is usually on one side of the head. A tearing or bloodshot eye and a runny nose on the same side of the headache may also accompany the pain. Cluster headaches are believed to be caused by chemical reactions in the brain. They have been described as the most severe and intense of any headache type. Treatment for cluster headache includes prescription medication and oxygen. SINUS HEADACHES Q: What is a sinus headache? What causes it? How can I treat it? A: When a cavity in the bones of the face and skull (a sinus) becomes inflamed, the inflammation will cause localized pain. This condition is usually the result of an allergic reaction, a tumor, or an infection. If your headache is caused by a sinus blockage, such as an infection, you will probably have a fever. An x-ray will confirm a sinus blockage. Your caregiver's treatment might include antibiotics for the infection, as well as antihistamines or decongestants.  REBOUND HEADACHES Q: What is a rebound headache? What causes it? How can I treat it? A: A pattern of taking acute headache medications too often can lead to a condition known as "rebound headache." A pattern of taking too much headache medication includes taking it more than 2 days per week or in excessive amounts. That means more than the label or a caregiver advises. With rebound headaches, your medications not only stop relieving pain, they actually begin to cause headaches. Doctors treat rebound headache by tapering the medication that is being overused. Sometimes your caregiver will gradually substitute a different type of treatment or medication. Stopping may be a challenge. Regularly overusing a medication increases the potential for serious side effects. Consult a caregiver if you regularly use headache medications more than 2 days per week or more than the label  advises. ADDITIONAL QUESTIONS AND ANSWERS Q: What is biofeedback? A: Biofeedback is a self-help treatment. Biofeedback uses special equipment to monitor your body's involuntary physical responses. Biofeedback monitors:  Breathing.  Pulse.  Heart rate.  Temperature.  Muscle tension.  Brain activity. Biofeedback helps you refine and perfect your relaxation exercises. You learn to control the physical responses that are related to stress. Once the technique has been mastered, you do not need the equipment any more. Q: Are headaches hereditary? A: Four out of five (80%) of people that suffer report a family history of migraine. Scientists are not sure if this is genetic or a family predisposition. Despite the uncertainty, a child has a 50% chance of having migraine if one parent suffers. The child has a 75% chance if both parents suffer.  Q: Can children get headaches? A: By the time they reach high school, most young people have experienced some type of headache. Many safe and effective approaches or medications can prevent a headache from occurring or stop it after it has begun.  Q: What type of doctor should I  see to diagnose and treat my headache? A: Start with your primary caregiver. Discuss his or her experience and approach to headaches. Discuss methods of classification, diagnosis, and treatment. Your caregiver may decide to recommend you to a headache specialist, depending upon your symptoms or other physical conditions. Having diabetes, allergies, etc., may require a more comprehensive and inclusive approach to your headache. The National Head[employer] Foundation will provide, upon request, a list of [facility] physician members in your state. Document Released: 11/27/2003 Document Revised: 11/29/2011 Document Reviewed: 05/06/2008 [facility] Patient Information 2014 [place], [place].

## 2013-10-11 ENCOUNTER — Ambulatory Visit: Payer: Self-pay

## 2014-01-08 ENCOUNTER — Encounter: Payer: Self-pay | Admitting: Internal Medicine

## 2014-01-21 ENCOUNTER — Encounter: Payer: Self-pay | Admitting: Internal Medicine

## 2014-02-17 ENCOUNTER — Encounter: Payer: Self-pay | Admitting: Gastroenterology

## 2014-03-25 ENCOUNTER — Other Ambulatory Visit: Payer: Self-pay | Admitting: Family Medicine

## 2014-03-25 DIAGNOSIS — N63 Unspecified lump in unspecified breast: Secondary | ICD-10-CM

## 2014-05-08 ENCOUNTER — Other Ambulatory Visit (HOSPITAL_COMMUNITY): Payer: Self-pay | Admitting: *Deleted

## 2014-05-08 DIAGNOSIS — N6310 Unspecified lump in the right breast, unspecified quadrant: Secondary | ICD-10-CM

## 2014-05-21 ENCOUNTER — Ambulatory Visit (HOSPITAL_COMMUNITY): Payer: Self-pay

## 2014-05-21 ENCOUNTER — Other Ambulatory Visit: Payer: Self-pay | Admitting: Internal Medicine

## 2014-05-21 ENCOUNTER — Other Ambulatory Visit: Payer: Self-pay

## 2014-05-24 ENCOUNTER — Emergency Department (HOSPITAL_COMMUNITY): Payer: Self-pay

## 2014-05-24 ENCOUNTER — Encounter: Payer: Self-pay | Admitting: Internal Medicine

## 2014-05-24 ENCOUNTER — Emergency Department (HOSPITAL_COMMUNITY)
Admission: EM | Admit: 2014-05-24 | Discharge: 2014-05-24 | Disposition: A | Discharge status: Home / Self Care | Payer: Self-pay | Attending: Emergency Medicine | Admitting: Emergency Medicine

## 2014-05-24 ENCOUNTER — Encounter (HOSPITAL_COMMUNITY): Payer: Self-pay | Admitting: Emergency Medicine

## 2014-05-24 DIAGNOSIS — R059 Cough, unspecified: Secondary | ICD-10-CM | POA: Insufficient documentation

## 2014-05-24 DIAGNOSIS — J069 Acute upper respiratory infection, unspecified: Secondary | ICD-10-CM | POA: Insufficient documentation

## 2014-05-24 DIAGNOSIS — F411 Generalized anxiety disorder: Secondary | ICD-10-CM | POA: Insufficient documentation

## 2014-05-24 DIAGNOSIS — I10 Essential (primary) hypertension: Secondary | ICD-10-CM | POA: Insufficient documentation

## 2014-05-24 DIAGNOSIS — B9789 Other viral agents as the cause of diseases classified elsewhere: Secondary | ICD-10-CM

## 2014-05-24 DIAGNOSIS — F3289 Other specified depressive episodes: Secondary | ICD-10-CM | POA: Insufficient documentation

## 2014-05-24 DIAGNOSIS — F329 Major depressive disorder, single episode, unspecified: Secondary | ICD-10-CM | POA: Insufficient documentation

## 2014-05-24 DIAGNOSIS — IMO0002 Reserved for concepts with insufficient information to code with codable children: Secondary | ICD-10-CM | POA: Insufficient documentation

## 2014-05-24 DIAGNOSIS — F17200 Nicotine dependence, unspecified, uncomplicated: Secondary | ICD-10-CM | POA: Insufficient documentation

## 2014-05-24 DIAGNOSIS — R079 Chest pain, unspecified: Secondary | ICD-10-CM | POA: Insufficient documentation

## 2014-05-24 DIAGNOSIS — Z7982 Long term (current) use of aspirin: Secondary | ICD-10-CM | POA: Insufficient documentation

## 2014-05-24 DIAGNOSIS — R05 Cough: Secondary | ICD-10-CM | POA: Insufficient documentation

## 2014-05-24 DIAGNOSIS — R51 Headache: Secondary | ICD-10-CM | POA: Insufficient documentation

## 2014-05-24 DIAGNOSIS — Z79899 Other long term (current) drug therapy: Secondary | ICD-10-CM | POA: Insufficient documentation

## 2014-05-24 DIAGNOSIS — E039 Hypothyroidism, unspecified: Secondary | ICD-10-CM | POA: Insufficient documentation

## 2014-05-24 LAB — CBC WITH DIFFERENTIAL/PLATELET
BASOS ABS: 0 10*3/uL (ref 0.0–0.1)
BASOS PCT: 0 % (ref 0–1)
Eosinophils Absolute: 0.1 10*3/uL (ref 0.0–0.7)
Eosinophils Relative: 2 % (ref 0–5)
HCT: 35.2 % — ABNORMAL LOW (ref 36.0–46.0)
Hemoglobin: 11.8 g/dL — ABNORMAL LOW (ref 12.0–15.0)
Lymphocytes Relative: 30 % (ref 12–46)
Lymphs Abs: 1.5 10*3/uL (ref 0.7–4.0)
MCH: 30.6 pg (ref 26.0–34.0)
MCHC: 33.5 g/dL (ref 30.0–36.0)
MCV: 91.2 fL (ref 78.0–100.0)
Monocytes Absolute: 0.4 10*3/uL (ref 0.1–1.0)
Monocytes Relative: 8 % (ref 3–12)
NEUTROS ABS: 2.9 10*3/uL (ref 1.7–7.7)
Neutrophils Relative %: 60 % (ref 43–77)
PLATELETS: 179 10*3/uL (ref 150–400)
RBC: 3.86 MIL/uL — ABNORMAL LOW (ref 3.87–5.11)
RDW: 13.1 % (ref 11.5–15.5)
WBC: 4.9 10*3/uL (ref 4.0–10.5)

## 2014-05-24 LAB — BASIC METABOLIC PANEL
ANION GAP: 9 (ref 5–15)
BUN: 9 mg/dL (ref 6–23)
CO2: 28 meq/L (ref 19–32)
Calcium: 8.8 mg/dL (ref 8.4–10.5)
Chloride: 102 mEq/L (ref 96–112)
Creatinine, Ser: 0.71 mg/dL (ref 0.50–1.10)
GFR calc non Af Amer: >90 mL/min (ref >90)
Glucose, Bld: 93 mg/dL (ref 70–99)
Potassium: 4.7 mEq/L (ref 3.7–5.3)
Sodium: 139 mEq/L (ref 137–147)

## 2014-05-24 LAB — I-STAT TROPONIN, ED: TROPONIN I, POC: 0 ng/mL (ref 0.00–0.08)

## 2014-05-24 MED ORDER — HYDROCOD POLST-CHLORPHEN POLST 10-8 MG/5ML PO LQCR: 5.0000 mL | Freq: Two times a day (BID) | ORAL | Status: DC | PRN

## 2014-05-24 MED ORDER — HYDROCODONE-HOMATROPINE 5-1.5 MG/5ML PO SYRP
5.0000 mL | ORAL_SOLUTION | Freq: Once | ORAL | Status: AC
  Administered 2014-05-24: 5 mL via ORAL
  Filled 2014-05-24: qty 5

## 2014-05-24 NOTE — ED Provider Notes (Signed)
CSN: [id number]     Arrival date & time 05/24/14  4098 History   First MD Initiated Contact with Patient 05/24/14 [phone]     Chief Complaint  Patient presents with  . Chest Pain  . Cough  . Headache     (Consider location/radiation/quality/duration/timing/severity/associated sxs/prior Treatment) Patient is a 48 y.o. female presenting with chest pain, cough, and headaches. The history is provided by the patient and medical records.  Chest Pain Associated symptoms: cough and headache   Cough Associated symptoms: chest pain and headaches   Headache Associated symptoms: cough    This is a 49 year old female with past medical history significant for hypertension, Graves' disease, anxiety, depression, presenting to the ED for URI type symptoms. Specifically patient has a dry cough, body aches, subjective fever and chills, and sore throat. States symptoms began 2 days ago and have been progressively worsening. States yesterday she began to have some chest pain in her mid-sternal region after bout of heavy coughing and temporarily felt short of breath.  States pain has continued occuring when coughing throughout the night, lasting a few seconds with each occurrence.  Denies pain without coughing.  States she thinks the cough is also giving her a headache.  Denies radiation of pain into extremities or neck, no dizziness, palpitations, nausea, or vomiting.  Patient has no prior cardiac hx.  Patient is a daily smoker.  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  VS stable on arrival.  Past Medical History  Diagnosis Date  . Depression   . Grave's disease 2007    TSH (08/31/2010) = 0.186 (low), free T4 = 0.91 (WNL)  . Anxiety   . Hypertension   . Postsurgical hypothyroidism   . Raynaud disease 2007   Past Surgical History  Procedure Laterality Date  . Colonoscopy  02/14/2008    done by Dr. Christella Hartigan - small colonic polyp identified, 6 mm in size and  per biopsy --> tubular adenoma with no malignancy or high grade dysplasia noted  . Abdominal hysterectomy    . Thyroidectomy  03/08/2012    [facility]; history of Graves' disease with multinodular goiter  . Wisdom tooth extraction     Family History  Problem Relation Age of Onset  . Colon cancer Maternal Grandfather   . Cancer Maternal Grandfather   . Hypertension Mother   . Thyroid disease Mother   . Hypertension Father   . Lupus Mother    History  Substance Use Topics  . Smoking status: Current Every Day Smoker -- 0.30 packs/day for 13 years    Types: Cigarettes    Last Attempt to Quit: 10/13/1996  . Smokeless tobacco: Not on file  . Alcohol Use: No   OB History   Grav Para Term Preterm Abortions TAB SAB Ect Mult Living   Review of Systems  Respiratory: Positive for cough.   Cardiovascular: Positive for chest pain.  Neurological: Positive for headaches.  All other systems reviewed and are negative.     Allergies  Adhesive  Home Medications   Prior to Admission medications   Medication Sig Start Date End Date Taking? Authorizing Provider  amitriptyline (ELAVIL) 100 MG tablet TAKE ONE TABLET BY MOUTH ONCE DAILY AT BEDTIME 05/22/14   Ky Barban, MD  aspirin 81 MG tablet Take 1 tablet (81 mg total) by mouth daily. 07/02/13  Ky Barban, MD  esomeprazole (NEXIUM) 40 MG capsule Take 1 capsule (40 mg total) by mouth daily. 09/26/13   Ky Barban, MD  hydrochlorothiazide (HYDRODIURIL) 25 MG tablet Take 1 tablet (25 mg total) by mouth daily. 07/02/13   Ky Barban, MD  levothyroxine (SYNTHROID, LEVOTHROID) 100 MCG tablet Take 1 tablet (100 mcg total) by mouth daily. 08/22/13 08/22/14  Ky Barban, MD  methylPREDNISolone (MEDROL DOSEPAK) 4 MG tablet follow package directions 10/06/13   Hayden Rasmussen, NP  ondansetron (ZOFRAN) 4 MG tablet Take 1 tablet (4 mg total) by mouth every 6 (six) hours. As needed for nausea  10/06/13   Hayden Rasmussen, NP  propranolol ER (INDERAL LA) 80 MG 24 hr capsule Take 1 capsule (80 mg total) by mouth daily. 05/23/13   Ky Barban, MD  triamcinolone cream (KENALOG) 0.1 % Apply 1 application topically 2 (two) times daily. May use on face 10/06/13   Hayden Rasmussen, NP   BP 142/97  Pulse 78  Temp(Src) 98 F (36.7 C) (Oral)  Resp 22  SpO2 100%  LMP 01/26/2000  Physical Exam  Nursing note and vitals reviewed. Constitutional: She is oriented to person, place, and time. She appears well-developed and well-nourished. No distress.  HENT:  Head: Normocephalic and atraumatic.  Right Ear: Tympanic membrane and ear canal normal.  Left Ear: Tympanic membrane and ear canal normal.  Nose: Nose normal.  Mouth/Throat: Uvula is midline, oropharynx is clear and moist and mucous membranes are normal. No oropharyngeal exudate, posterior oropharyngeal edema, posterior oropharyngeal erythema or tonsillar abscesses.  Tonsils normal in appearance bilaterally without exudate; uvula midline without peritonsillar abscess; handling secretions appropriately; no difficulty swallowing or speaking  Eyes: Conjunctivae and EOM are normal. Pupils are equal, round, and reactive to light.  Neck: Normal range of motion. Neck supple.  Cardiovascular: Normal rate, regular rhythm and normal heart sounds.   Pulmonary/Chest: Effort normal and breath sounds normal. No accessory muscle usage. Not tachypneic. No respiratory distress. She has no wheezes. She has no rhonchi.  Abdominal: Soft. Bowel sounds are normal. There is no tenderness. There is no guarding.  Musculoskeletal: Normal range of motion. She exhibits no edema.  Neurological: She is alert and oriented to person, place, and time.  AAOx3, answering questions appropriately; equal strength UE and LE bilaterally; CN grossly intact; moves all extremities appropriately without ataxia; no focal neuro deficits or facial asymmetry appreciated  Skin: Skin is warm and  dry. She is not diaphoretic.  Psychiatric: She has a normal mood and affect.    ED Course  Procedures (including critical care time) Labs Review Labs Reviewed  CBC WITH DIFFERENTIAL - Abnormal; Notable for the following:    RBC 3.86 (*)    Hemoglobin 11.8 (*)    HCT 35.2 (*)    All other components within normal limits  BASIC METABOLIC PANEL  Rosezena Sensor, ED    Imaging Review Dg Chest 2 View  05/24/2014   CLINICAL DATA:  Chest pain.  EXAM: CHEST  2 VIEW  COMPARISON:  October 14, 2011.  FINDINGS: The heart size and mediastinal contours are within normal limits. Both lungs are clear. No pneumothorax or pleural effusion is noted. The visualized skeletal structures are unremarkable.  IMPRESSION: No acute cardiopulmonary abnormality seen.   Electronically Signed   By: Roque Lias M.D.   On: 05/24/2014 08:57     EKG Interpretation None      Date: 05/24/2014  Rate: 75  Rhythm: normal sinus rhythm  QRS Axis: normal  Intervals: normal  ST/T Wave abnormalities: normal  Conduction Disutrbances:none  Narrative Interpretation:   Old EKG Reviewed: unchanged    MDM   Final diagnoses:  Viral URI with cough  Chest pain, unspecified chest pain type   48 year old female with URI type symptoms. Reported intermittent chest pain occurring coughing starting yesterday. Patient has no prior cardiac history. EKG normal sinus rhythm without ischemic change. Troponin is negative. Chest x-ray is clear. Lab work is reassuring.  Patient has remained without any respiratory distress in the ED, VS stable on RA.  Patient has limited cardiac risk factors, heart score of 2. At this time I have low suspicion for ACS, PE, dissection, or other acute cardiac event. She'll be discharged home with close PCP followup.  Rx tussionex-- advised may make drowsy and to take cautiously.  Discussed plan with patient, he/she acknowledged understanding and agreed with plan of care.  Return precautions given for new or  worsening symptoms.  Garlon Hatchet, PA-C 05/24/14 1133

## 2014-05-24 NOTE — Discharge Instructions (Signed)
Take the prescribed medication as directed.  Use caution-- can make you drowsy. Follow-up with your primary care physician. Return to the ED for new or worsening symptoms.

## 2014-05-24 NOTE — ED Notes (Signed)
Pt c/o cough, chills and headache onset Wednesday. Yesterday pt began to have chest pain with shortness of breath and diaphoresis.

## 2014-05-25 NOTE — ED Provider Notes (Signed)
Medical screening examination/treatment/procedure(s) were performed by non-physician practitioner and as supervising physician I was immediately available for consultation/collaboration.   EKG Interpretation None        Matthew Gentry, MD 05/25/14 1423 

## 2014-05-31 NOTE — Discharge Planning (Signed)
Telephone Encounter  Spoke with patient regarding her expired orange card and upcoming follow up appointment. Orange card application will be mailed to the address provided. My contact information also provided for any future questions or concerns. No other Community Liaison needs identified at this time. Buddy Duty, [facility] Community Liaison 05/31/2014 1:00PM

## 2014-06-03 ENCOUNTER — Encounter: Payer: Self-pay | Admitting: Internal Medicine

## 2014-06-04 ENCOUNTER — Other Ambulatory Visit: Payer: Self-pay

## 2014-06-04 ENCOUNTER — Inpatient Hospital Stay (HOSPITAL_COMMUNITY): Admission: RE | Admit: 2014-06-04 | Payer: Self-pay | Source: Ambulatory Visit

## 2014-07-04 ENCOUNTER — Emergency Department (HOSPITAL_COMMUNITY)
Admission: EM | Admit: 2014-07-04 | Discharge: 2014-07-04 | Disposition: A | Discharge status: Home / Self Care | Payer: Self-pay | Attending: Emergency Medicine | Admitting: Emergency Medicine

## 2014-07-04 ENCOUNTER — Encounter (HOSPITAL_COMMUNITY): Payer: Self-pay | Admitting: Emergency Medicine

## 2014-07-04 DIAGNOSIS — Z720 Tobacco use: Secondary | ICD-10-CM | POA: Insufficient documentation

## 2014-07-04 DIAGNOSIS — F419 Anxiety disorder, unspecified: Secondary | ICD-10-CM | POA: Insufficient documentation

## 2014-07-04 DIAGNOSIS — F329 Major depressive disorder, single episode, unspecified: Secondary | ICD-10-CM | POA: Insufficient documentation

## 2014-07-04 DIAGNOSIS — Z79899 Other long term (current) drug therapy: Secondary | ICD-10-CM | POA: Insufficient documentation

## 2014-07-04 DIAGNOSIS — R51 Headache: Secondary | ICD-10-CM | POA: Insufficient documentation

## 2014-07-04 DIAGNOSIS — E039 Hypothyroidism, unspecified: Secondary | ICD-10-CM | POA: Insufficient documentation

## 2014-07-04 DIAGNOSIS — Z7982 Long term (current) use of aspirin: Secondary | ICD-10-CM | POA: Insufficient documentation

## 2014-07-04 DIAGNOSIS — R519 Headache, unspecified: Secondary | ICD-10-CM

## 2014-07-04 DIAGNOSIS — R112 Nausea with vomiting, unspecified: Secondary | ICD-10-CM | POA: Insufficient documentation

## 2014-07-04 DIAGNOSIS — I10 Essential (primary) hypertension: Secondary | ICD-10-CM | POA: Insufficient documentation

## 2014-07-04 MED ORDER — METOCLOPRAMIDE HCL 5 MG/ML IJ SOLN
10.0000 mg | Freq: Once | INTRAMUSCULAR | Status: AC
  Administered 2014-07-04: 10 mg via INTRAMUSCULAR
  Filled 2014-07-04: qty 2

## 2014-07-04 MED ORDER — HYDROMORPHONE HCL 1 MG/ML IJ SOLN
1.0000 mg | Freq: Once | INTRAMUSCULAR | Status: AC
  Administered 2014-07-04: 1 mg via INTRAMUSCULAR
  Filled 2014-07-04: qty 1

## 2014-07-04 MED ORDER — DIPHENHYDRAMINE HCL 50 MG/ML IJ SOLN
25.0000 mg | Freq: Once | INTRAMUSCULAR | Status: AC
  Administered 2014-07-04: 25 mg via INTRAMUSCULAR
  Filled 2014-07-04: qty 1

## 2014-07-04 MED ORDER — ONDANSETRON 4 MG PO TBDP
8.0000 mg | ORAL_TABLET | Freq: Once | ORAL | Status: AC
  Administered 2014-07-04: 8 mg via ORAL
  Filled 2014-07-04: qty 2

## 2014-07-04 MED ORDER — KETOROLAC TROMETHAMINE 60 MG/2ML IM SOLN
60.0000 mg | Freq: Once | INTRAMUSCULAR | Status: AC
  Administered 2014-07-04: 60 mg via INTRAMUSCULAR
  Filled 2014-07-04: qty 2

## 2014-07-04 NOTE — ED Provider Notes (Signed)
CSN: [id number]     Arrival date & time 07/04/14  [id number] History   First MD Initiated Contact with Patient 07/04/14 0740     Chief Complaint  Patient presents with  . Migraine     (Consider location/radiation/quality/duration/timing/severity/associated sxs/prior Treatment) [name] is a 48 y.o. female  Who presents emergency department complaining of a headache. Patient states headache started last night. States it's in the right side of her head. Describes it as sharp. Gradual onset. History of similar headaches in the past, diagnosed with migraine. He is to be followed by Dr. Sandria ManlyLove with [facility], but unable to followup now due to loss of insurance. Patient reports associated photophobia, also states she's seeing floaters, which is  Typical of her migraines. She states she took Tylenol which normally helps but it did not help her this time. States when her pain is severe she needs a "shot." Patient currently reports her pain is 10 out of 10. States she has been nauseated and vomited multiple times. Patient states usually Dilaudid works for her. Patient denies any head injuries. Denies any fever or chills. No neck pain or stiffness.  Past Medical History  Diagnosis Date  . Depression   . Grave's disease 2007    TSH (08/31/2010) = 0.186 (low), free T4 = 0.91 (WNL)  . Anxiety   . Hypertension   . Postsurgical hypothyroidism   . Raynaud disease 2007   Past Surgical History  Procedure Laterality Date  . Colonoscopy  02/14/2008    done by Dr. Christella HartiganJacobs - small colonic polyp identified, 6 mm in size and per biopsy --> tubular adenoma with no malignancy or high grade dysplasia noted  . Abdominal hysterectomy    . Thyroidectomy  03/08/2012    [facility]; history of Graves' disease with multinodular goiter  . Wisdom tooth extraction     Family History  Problem Relation Age of Onset  . Colon cancer Maternal Grandfather   . Cancer Maternal Grandfather   .  Hypertension Mother   . Thyroid disease Mother   . Hypertension Father   . Lupus Mother    History  Substance Use Topics  . Smoking status: Current Every Day Smoker -- 0.30 packs/day for 13 years    Types: Cigarettes    Last Attempt to Quit: 10/13/1996  . Smokeless tobacco: Not on file  . Alcohol Use: No   OB History   Grav Para Term Preterm Abortions TAB SAB Ect Mult Living   3 3 2 1      2      Review of Systems  Constitutional: Negative for fever and chills.  HENT: Negative for congestion.   Respiratory: Negative for cough, chest tightness and shortness of breath.   Cardiovascular: Negative for chest pain, palpitations and leg swelling.  Gastrointestinal: Positive for nausea and vomiting. Negative for abdominal pain.  Musculoskeletal: Negative for arthralgias, myalgias, neck pain and neck stiffness.  Skin: Negative for rash.  Neurological: Positive for headaches. Negative for dizziness, speech difficulty, weakness and numbness.  All other systems reviewed and are negative.     Allergies  Adhesive  Home Medications   Prior to Admission medications   Medication Sig Start Date End Date Taking? Authorizing Provider  amitriptyline (ELAVIL) 100 MG tablet Take 100 mg by mouth at bedtime.    Historical Provider, MD  aspirin 81 MG tablet Take 1 tablet (81 mg total) by mouth daily. 07/02/13   Ky BarbanSolianny D Kennerly, MD  chlorpheniramine-HYDROcodone (TUSSIONEX) 10-8  MG/5ML LQCR Take 5 mLs by mouth every 12 (twelve) hours as needed for cough. 05/24/14   Garlon HatchetLisa M Sanders, PA-C  ibuprofen (ADVIL,MOTRIN) 200 MG tablet Take 400 mg by mouth every 6 (six) hours as needed.    Historical Provider, MD  levothyroxine (SYNTHROID, LEVOTHROID) 100 MCG tablet Take 1 tablet (100 mcg total) by mouth daily. 08/22/13 08/22/14  Ky BarbanSolianny D Kennerly, MD  Phenylephrine-Pheniramine-DM [facility](THERAFLU COLD & COUGH PO) Take 1 Package by mouth daily as needed (for cold symptoms).    Historical Provider, MD  propranolol ER  (INDERAL LA) 80 MG 24 hr capsule Take 1 capsule (80 mg total) by mouth daily. 05/23/13   Ky BarbanSolianny D Kennerly, MD   BP 155/99  Pulse 87  Temp(Src) 98.7 F (37.1 C) (Oral)  Resp 17  Ht 5\' 4"  (1.626 m)  Wt 163 lb (73.936 kg)  BMI 27.97 kg/m2  SpO2 100%  LMP 01/26/2000 Physical Exam  Nursing note and vitals reviewed. Constitutional: She is oriented to person, place, and time. She appears well-developed and well-nourished. No distress.  HENT:  Head: Normocephalic.  Eyes: Conjunctivae and EOM are normal. Pupils are equal, round, and reactive to light.  Neck: Normal range of motion. Neck supple.  Cardiovascular: Normal rate, regular rhythm and normal heart sounds.   Pulmonary/Chest: Effort normal and breath sounds normal. No respiratory distress. She has no wheezes. She has no rales.  Musculoskeletal: She exhibits no edema.  Neurological: She is alert and oriented to person, place, and time. No cranial nerve deficit. Coordination normal.  5/5 and equal upper and lower extremity strength bilaterally. Equal grip strength bilaterally. Normal finger to nose and heel to shin. No pronator drift. Gait is normal  Skin: Skin is warm and dry.  Psychiatric: She has a normal mood and affect. Her behavior is normal.    ED Course  Procedures (including critical care time) Labs Review Labs Reviewed - No data to display  Imaging Review No results found.   EKG Interpretation None      MDM   Final diagnoses:  Acute nonintractable headache, unspecified headache type    The patient is here with gradual onset of right-sided headache, similar to prior migraines. No relief of pain at home with Tylenol. Normal neurological exam. Will try an migraine cocktail, Reglan, Toradol, Benadryl.  Patient has no meningismus, no concern for intracerebral hemorrhage.   8:40 AM Pt had no relief with migraine cocktail. States "my pain got worser." Pain previously rated as 10/10. Pt again stated that dilaudid and  phenergan works for her. Will give 1mg  of dilaudid im.   9:28 AM Pt feels better after dilaudid Will dc home.   Filed Vitals:   07/04/14 0815 07/04/14 0900 07/04/14 0915 07/04/14 0930  BP: 137/85 109/83 105/90 147/93  Pulse: 84 85 76 82  Temp:      TempSrc:      Resp:      Height:      Weight:      SpO2: 97% 100% 98% 99%     Myriam Jacobsonatyana A Deloyd Handy, PA-C 07/04/14 1508

## 2014-07-04 NOTE — Discharge Instructions (Signed)
Get plenty of rest. Stay hydrated. Follow up with your doctor if not improving. Return if worsening.   Migraine Headache A migraine headache is an intense, throbbing pain on one or both sides of your head. A migraine can last for 30 minutes to several hours. CAUSES  The exact cause of a migraine headache is not always known. However, a migraine may be caused when nerves in the brain become irritated and release chemicals that cause inflammation. This causes pain. Certain things may also trigger migraines, such as:  Alcohol.  Smoking.  Stress.  Menstruation.  Aged cheeses.  Foods or drinks that contain nitrates, glutamate, aspartame, or tyramine.  Lack of sleep.  Chocolate.  Caffeine.  Hunger.  Physical exertion.  Fatigue.  Medicines used to treat chest pain (nitroglycerine), birth control pills, estrogen, and some blood pressure medicines. SIGNS AND SYMPTOMS  Pain on one or both sides of your head.  Pulsating or throbbing pain.  Severe pain that prevents daily activities.  Pain that is aggravated by any physical activity.  Nausea, vomiting, or both.  Dizziness.  Pain with exposure to bright lights, loud noises, or activity.  General sensitivity to bright lights, loud noises, or smells. Before you get a migraine, you may get warning signs that a migraine is coming (aura). An aura may include:  Seeing flashing lights.  Seeing bright spots, halos, or zigzag lines.  Having tunnel vision or blurred vision.  Having feelings of numbness or tingling.  Having trouble talking.  Having muscle weakness. DIAGNOSIS  A migraine headache is often diagnosed based on:  Symptoms.  Physical exam.  A CT scan or MRI of your head. These imaging tests cannot diagnose migraines, but they can help rule out other causes of headaches. TREATMENT Medicines may be given for pain and nausea. Medicines can also be given to help prevent recurrent migraines.  HOME CARE  INSTRUCTIONS  Only take over-the-counter or prescription medicines for pain or discomfort as directed by your health care provider. The use of long-term narcotics is not recommended.  Lie down in a dark, quiet room when you have a migraine.  Keep a journal to find out what may trigger your migraine headaches. For example, write down:  What you eat and drink.  How much sleep you get.  Any change to your diet or medicines.  Limit alcohol consumption.  Quit smoking if you smoke.  Get 7-9 hours of sleep, or as recommended by your health care provider.  Limit stress.  Keep lights dim if bright lights bother you and make your migraines worse. SEEK IMMEDIATE MEDICAL CARE IF:   Your migraine becomes severe.  You have a fever.  You have a stiff neck.  You have vision loss.  You have muscular weakness or loss of muscle control.  You start losing your balance or have trouble walking.  You feel faint or pass out.  You have severe symptoms that are different from your first symptoms. MAKE SURE YOU:   Understand these instructions.  Will watch your condition.  Will get help right away if you are not doing well or get worse. Document Released: 09/06/2005 Document Revised: 01/21/2014 Document Reviewed: 05/14/2013 [facility] Patient Information 2015 [employer] [place]. This information is not intended to replace advice given to you by your health care provider. Make sure you discuss any questions you have with your health care provider.

## 2014-07-04 NOTE — ED Notes (Signed)
Pt. Has a migraine since yesterday with n/v.  Hx of migraines.  Does not see a doctor.  Pain is a 10/10, Pt. Ambulated to room, gait steady.  GCS 15 Skin is warm and dry.

## 2014-07-04 NOTE — ED Provider Notes (Signed)
Medical screening examination/treatment/procedure(s) were performed by non-physician practitioner and as supervising physician I was immediately available for consultation/collaboration.    Vida RollerBrian D Maison Kestenbaum, MD 07/04/14 [phone]

## 2014-07-04 NOTE — ED Notes (Signed)
Pt's husband driving pt home.

## 2014-07-09 ENCOUNTER — Ambulatory Visit (HOSPITAL_COMMUNITY)
Admission: RE | Admit: 2014-07-09 | Discharge: 2014-07-09 | Disposition: A | Discharge status: Home / Self Care | Payer: Self-pay | Source: Ambulatory Visit | Attending: Obstetrics and Gynecology | Admitting: Obstetrics and Gynecology

## 2014-07-09 ENCOUNTER — Encounter (HOSPITAL_COMMUNITY): Payer: Self-pay

## 2014-07-09 VITALS — BP 120/70 | Temp 98.2°F | Ht 64.0 in | Wt 165.8 lb

## 2014-07-09 DIAGNOSIS — N644 Mastodynia: Secondary | ICD-10-CM

## 2014-07-09 DIAGNOSIS — Z1239 Encounter for other screening for malignant neoplasm of breast: Secondary | ICD-10-CM

## 2014-07-09 NOTE — Progress Notes (Signed)
Complaints of sharp right breast pain that comes and goes. Patient rated pain at a 8 out of 10.  Pap Smear:  Pap smear not completed today. Last Pap smear was was 10 years ago and normal per patient. Per patient has no history of an abnormal Pap smear. Patient has a history of a hysterectomy for fibroids 02/07/2002. Patient no longer needs Pap smears due to her history of a hysterectomy for benign reasons per ACOG and BCCCP guidelines. No Pap smear results are in EPIC.  Physical exam: Breasts Breasts symmetrical. No skin abnormalities bilateral breasts. No nipple retraction bilateral breasts. No nipple discharge bilateral breasts. No lymphadenopathy. No lumps palpated bilateral breasts. Complaints of right outer and lower breast tenderness on exam. [facility] per recommendation. Appointment scheduled for Wednesday, July 10, 2014 at 0945.       Pelvic/Bimanual No Pap smear completed today since patient has a history of a hysterectomy for benign reasons. Pap smear not indicated per BCCCP guidelines.   Smoking cessation discussed with patient. Referred patient to the [facility] Quit[facility] and resources given to free smoking cessation classes.

## 2014-07-09 NOTE — Patient Instructions (Signed)
Explained to Alla Germanammie M Giampietro that she did not need a Pap smear today due to last Pap smear due to a history of a hysterectomy for benign reasons and no history of an abnormal Pap smear. Informed patient that she no longer needs Pap smears due to her history of a hysterectomy for benign reasons. [facility] mamm[facility] per recommendation. Appointment scheduled for Wednesday, July 10, 2014 at 0945. Patient aware of appointment and is unable to keep appointment due to work schedule. Patient has been given phone number and is going to call and reschedule appointment. Smoking cessation discussed with patient and resources given. Alla Germanammie M Lamprecht verbalized understanding.  [place], Kathaleen Maserhristine Poll, RN 3:35 PM

## 2014-07-10 ENCOUNTER — Other Ambulatory Visit: Payer: Self-pay

## 2014-07-19 ENCOUNTER — Ambulatory Visit: Payer: Self-pay

## 2014-07-19 ENCOUNTER — Other Ambulatory Visit: Payer: Self-pay

## 2014-07-22 ENCOUNTER — Ambulatory Visit (INDEPENDENT_AMBULATORY_CARE_PROVIDER_SITE_OTHER): Payer: Self-pay | Admitting: Internal Medicine

## 2014-07-22 ENCOUNTER — Ambulatory Visit (INDEPENDENT_AMBULATORY_CARE_PROVIDER_SITE_OTHER): Payer: Self-pay | Admitting: *Deleted

## 2014-07-22 ENCOUNTER — Ambulatory Visit: Payer: Self-pay

## 2014-07-22 ENCOUNTER — Encounter: Payer: Self-pay | Admitting: Internal Medicine

## 2014-07-22 VITALS — BP 153/97 | HR 97 | Temp 98.5°F | Wt 164.5 lb

## 2014-07-22 DIAGNOSIS — Z23 Encounter for immunization: Secondary | ICD-10-CM

## 2014-07-22 DIAGNOSIS — E039 Hypothyroidism, unspecified: Secondary | ICD-10-CM

## 2014-07-22 DIAGNOSIS — R51 Headache: Secondary | ICD-10-CM

## 2014-07-22 DIAGNOSIS — I10 Essential (primary) hypertension: Secondary | ICD-10-CM

## 2014-07-22 DIAGNOSIS — J309 Allergic rhinitis, unspecified: Secondary | ICD-10-CM

## 2014-07-22 DIAGNOSIS — R519 Headache, unspecified: Secondary | ICD-10-CM

## 2014-07-22 DIAGNOSIS — F17200 Nicotine dependence, unspecified, uncomplicated: Secondary | ICD-10-CM

## 2014-07-22 DIAGNOSIS — Z720 Tobacco use: Secondary | ICD-10-CM

## 2014-07-22 DIAGNOSIS — Z0000 Encounter for general adult medical examination without abnormal findings: Secondary | ICD-10-CM

## 2014-07-22 MED ORDER — METOPROLOL TARTRATE 25 MG PO TABS: 25.0000 mg | ORAL_TABLET | Freq: Two times a day (BID) | ORAL | Status: DC

## 2014-07-22 MED ORDER — CETIRIZINE HCL 10 MG PO TABS: 10.0000 mg | ORAL_TABLET | Freq: Every day | ORAL | Status: DC

## 2014-07-22 MED ORDER — AMITRIPTYLINE HCL 100 MG PO TABS: 100.0000 mg | ORAL_TABLET | Freq: Every day | ORAL | Status: DC

## 2014-07-22 MED ORDER — LEVOTHYROXINE SODIUM 100 MCG PO TABS
100.0000 ug | ORAL_TABLET | Freq: Every day | ORAL | Status: DC
Start: 2014-07-22 — End: 2014-09-15

## 2014-07-22 MED ORDER — SALINE SPRAY 0.65 % NA SOLN: 1.0000 | NASAL | Status: DC | PRN

## 2014-07-22 NOTE — Assessment & Plan Note (Signed)
2 weeks of nasal congestion with no fever/chills, dry cough to scant production of clear sputum.  Rx Zyrtec 10mg  daily Nasal saline spray 4 times daily

## 2014-07-22 NOTE — Assessment & Plan Note (Signed)
BP Readings from Last 3 Encounters:  07/22/14 153/97  07/09/14 120/70  07/04/14 147/93    Lab Results  Component Value Date   NA 139 05/24/2014   K 4.7 05/24/2014   CREATININE 0.71 05/24/2014    Assessment: Blood pressure control: mildly elevated Progress toward BP goal:  unchanged Comments: BP elevated today but in context for ongoing use of Afrin and Sudafed.   Plan: Medications:  resume metoprolol 25mg  BID, counseled pt to avoid decongestants.  Educational resources provided:   Self management tools provided:   Other plans: Follow up in 6 weeks.

## 2014-07-22 NOTE — Assessment & Plan Note (Signed)
  Assessment: Progress toward smoking cessation:  smoking the same amount Barriers to progress toward smoking cessation:    Comments: Smoking 5 cigarettes per day. Has quit for 2 years in the past.   Plan: Instruction/counseling given:  I counseled patient on the dangers of tobacco use, advised patient to stop smoking, and reviewed strategies to maximize success. Educational resources provided:    Self management tools provided:    Medications to assist with smoking cessation:  None Patient agreed to the following self-care plans for smoking cessation: go to the [employer] website ([employer].com.eewww.quitlinenc.com), call [employer] (1-800-QUIT-NOW)  Other plans: Wants to continue cutting back .

## 2014-07-22 NOTE — Progress Notes (Signed)
   Subjective:    Patient ID: Amy [name], female    DOB: 12/07/1965, 48 y.o.   MRN: [id number]  HPI Ms. Amy [name] is a 48 year old woman with PMH of HTN, hypothyroidism, who presents for ED follow up visit for headache. [facility] for intractable headache, was given a headache "cocktail" of Reglan, Toradol, Benadryl. She was given Dilaudid 1mg  IV, her headache resolved and she was discharged home. She has been on Inderal and amitriptyline for headache prophylaxis but has not taken these medications. [facility] her [facility] but has not followed up with them due to lack of insurance.   She has not taken her synthroid medication in one year as she did not know she could [facility]. She reports cold intolerance.   She saw Dr. Sharyn LullHarwani sometime this past year and was taking metoprolol 25mg  BID.   She denies recurrent headache since her ED visit.   She will meet with Rudell Cobbeborah Hill today for medical insurance application after this visit.   She is scheduled for a free mammogram tomorrow.   She has had nasal congestion for 2 weeks now with a cough productive of scant white sputum. She denies fever/chills.      Review of Systems  Constitutional: Negative for fever, chills, diaphoresis, activity change, appetite change, fatigue and unexpected weight change.  HENT: Positive for congestion, postnasal drip and rhinorrhea. Negative for ear pain, sinus pressure and sore throat.   Respiratory: Positive for cough. Negative for shortness of breath.   Cardiovascular: Negative for chest pain, palpitations and leg swelling.  Gastrointestinal: Positive for constipation. Negative for abdominal pain.  Genitourinary: Negative for dysuria.  Skin: Negative for color change.  Neurological: Negative for dizziness, weakness, light-headedness and headaches.  Psychiatric/Behavioral: Negative for agitation.      Objective:   Physical Exam  Constitutional: She is oriented to person, place, and time. She appears well-developed and well-nourished. No distress.  HENT:  Mouth/Throat: Oropharynx is clear and moist. No oropharyngeal exudate.  Pale boggy nasal turbinates bilaterally   Eyes: Conjunctivae are normal.  Cardiovascular: Normal rate and regular rhythm.   Pulmonary/Chest: Effort normal and breath sounds normal. No respiratory distress. She has no wheezes. She has no rales.  Musculoskeletal: She exhibits no edema.  Neurological: She is alert and oriented to person, place, and time. Coordination normal.  Skin: Skin is warm and dry. She is not diaphoretic.  Psychiatric: She has a normal mood and affect.  Nursing note and vitals reviewed.         Assessment & Plan:

## 2014-07-22 NOTE — Assessment & Plan Note (Signed)
-  Has not taken synthroid in 1 yr. Did not want labs today due to lack of insurance--will meet with financial counselor today. Symptomatic with cold intolerance.  Resumed synthroid daily with TSH check in 6 weeks.

## 2014-07-22 NOTE — Patient Instructions (Addendum)
General Instructions: -Start taking metoprolol 25mg  twice per day.  -Start taking synthroid 100mcg daily for your thyroid.  -Start taking amitriptyline 100mg  at bedtime to prevent headaches.  -Start taking Zyrtec 10mg  daily for allergies. Avoid Affrin, Sudafed, and other nasal decongestants. You may use saline nasal spray 4 times per day to help with your nasal congestion.  -Call 1-800-QUIT-NOW for tips on how to quit smoking. -Return in 6 weeks for lab work and blood pressure recheck.  -Have a wonderful Thanksgiving!    Please bring your medicines with you each time you come to clinic.  Medicines may include prescription medications, over-the-counter medications, herbal remedies, eye drops, vitamins, or other pills.   Progress Toward Treatment Goals:  Treatment Goal 07/22/2014  Blood pressure unchanged  Stop smoking smoking the same amount    Self Care Goals & Plans:  Self Care Goal 07/22/2014  Manage my medications bring my medications to every visit; refill my medications on time  Eat healthy foods eat more vegetables; eat foods that are low in salt  Be physically active -  Stop smoking go to the [employer] website ([employer]com.eewww.quitlinenc.com); call [employer] (1-800-QUIT-NOW)  Meeting treatment goals maintain the current self-care plan    No flowsheet data found.   Care Management & Community Referrals:  Referral 07/22/2014  Referrals made for care management support none needed       Smoking Cessation, Tips for Success If you are ready to quit smoking, congratulations! You have chosen to help yourself be healthier. Cigarettes bring nicotine, tar, carbon monoxide, and other irritants into your body. Your lungs, heart, and blood vessels will be able to work better without these poisons. There are many different ways to quit smoking. Nicotine gum, nicotine patches, a nicotine inhaler, or nicotine nasal spray can help with physical craving. Hypnosis, support groups, and medicines  help break the habit of smoking. WHAT THINGS CAN I DO TO MAKE QUITTING EASIER?  Here are some tips to help you quit for good:  Pick a date when you will quit smoking completely. Tell all of your friends and family about your plan to quit on that date.  Do not try to slowly cut down on the number of cigarettes you are smoking. Pick a quit date and quit smoking completely starting on that day.  Throw away all cigarettes.   Clean and remove all ashtrays from your home, work, and car.  On a card, write down your reasons for quitting. Carry the card with you and read it when you get the urge to smoke.  Cleanse your body of nicotine. Drink enough water and fluids to keep your urine clear or pale yellow. Do this after quitting to flush the nicotine from your body.  Learn to predict your moods. Do not let a bad situation be your excuse to have a cigarette. Some situations in your life might tempt you into wanting a cigarette.  Never have "just one" cigarette. It leads to wanting another and another. Remind yourself of your decision to quit.  Change habits associated with smoking. If you smoked while driving or when feeling stressed, try other activities to replace smoking. Stand up when drinking your coffee. Brush your teeth after eating. Sit in a different chair when you read the paper. Avoid alcohol while trying to quit, and try to drink fewer caffeinated beverages. Alcohol and caffeine may urge you to smoke.  Avoid foods and drinks that can trigger a desire to smoke, such as sugary or spicy foods and  alcohol.  Ask people who smoke not to smoke around you.  Have something planned to do right after eating or having a cup of coffee. For example, plan to take a walk or exercise.  Try a relaxation exercise to calm you down and decrease your stress. Remember, you may be tense and nervous for the first 2 weeks after you quit, but this will pass.  Find new activities to keep your hands busy. Play  with a pen, coin, or rubber band. Doodle or draw things on paper.  Brush your teeth right after eating. This will help cut down on the craving for the taste of tobacco after meals. You can also try mouthwash.   Use oral substitutes in place of cigarettes. Try using lemon drops, carrots, cinnamon sticks, or chewing gum. Keep them handy so they are available when you have the urge to smoke.  When you have the urge to smoke, try deep breathing.  Designate your home as a nonsmoking area.  If you are a heavy smoker, ask your health care provider about a prescription for nicotine chewing gum. It can ease your withdrawal from nicotine.  Reward yourself. Set aside the cigarette money you save and buy yourself something nice.  Look for support from others. Join a support group or smoking cessation program. Ask someone at home or at work to help you with your plan to quit smoking.  Always ask yourself, "Do I need this cigarette or is this just a reflex?" Tell yourself, "Today, I choose not to smoke," or "I do not want to smoke." You are reminding yourself of your decision to quit.  Do not replace cigarette smoking with electronic cigarettes (commonly called e-cigarettes). The safety of e-cigarettes is unknown, and some may contain harmful chemicals.  If you relapse, do not give up! Plan ahead and think about what you will do the next time you get the urge to smoke. HOW WILL I FEEL WHEN I QUIT SMOKING? You may have symptoms of withdrawal because your body is used to nicotine (the addictive substance in cigarettes). You may crave cigarettes, be irritable, feel very hungry, cough often, get headaches, or have difficulty concentrating. The withdrawal symptoms are only temporary. They are strongest when you first quit but will go away within 10-14 days. When withdrawal symptoms occur, stay in control. Think about your reasons for quitting. Remind yourself that these are signs that your body is healing and  getting used to being without cigarettes. Remember that withdrawal symptoms are easier to treat than the major diseases that smoking can cause.  Even after the withdrawal is over, expect periodic urges to smoke. However, these cravings are generally short lived and will go away whether you smoke or not. Do not smoke! WHAT RESOURCES ARE AVAILABLE TO HELP ME QUIT SMOKING? Your health care provider can direct you to community resources or hospitals for support, which may include:  Group support.  Education.  Hypnosis.  Therapy. Document Released: 06/04/2004 Document Revised: 01/21/2014 Document Reviewed: 02/22/2013 [facility] Patient Information 2015 [employer] [place] [place]. This information is not intended to replace advice given to you by your health care provider. Make sure you discuss any questions you have with your health care provider.

## 2014-07-22 NOTE — Assessment & Plan Note (Signed)
Received the flu vaccine during this visit.  Needs colonoscopy--had it in May 2009 with one tubular adenoma polyp needs colonoscopy in 5-10 yrs per current guidelines.  No need for Pap smear as she had hysterectomy for heavy bleeding (no hx of cancer).  Needs Tdap during her next visit, after she has acquired insurance.

## 2014-07-22 NOTE — Assessment & Plan Note (Signed)
She has had no more headache since her ED visit on 10/15.  She did well on amitriptyline and wants to resume this medication.   Rx amitriptyline  100mg  qHS  She is not able to afford propanolol, will continue her metoprolol for BB started by her Cardiologist.

## 2014-07-23 ENCOUNTER — Ambulatory Visit
Admission: RE | Admit: 2014-07-23 | Discharge: 2014-07-23 | Disposition: A | Discharge status: Home / Self Care | Payer: No Typology Code available for payment source | Source: Ambulatory Visit | Attending: Obstetrics and Gynecology | Admitting: Obstetrics and Gynecology

## 2014-07-23 DIAGNOSIS — N6310 Unspecified lump in the right breast, unspecified quadrant: Secondary | ICD-10-CM

## 2014-07-25 NOTE — Progress Notes (Signed)
Internal Medicine Clinic Attending  Case discussed with Dr. Kennerly soon after the resident saw the patient.  We reviewed the resident's history and exam and pertinent patient test results.  I agree with the assessment, diagnosis, and plan of care documented in the resident's note.  

## 2014-07-26 ENCOUNTER — Other Ambulatory Visit: Payer: Self-pay

## 2014-07-26 ENCOUNTER — Ambulatory Visit: Payer: Self-pay

## 2014-07-29 ENCOUNTER — Ambulatory Visit: Payer: Self-pay

## 2014-08-05 ENCOUNTER — Ambulatory Visit: Payer: Self-pay

## 2014-08-13 ENCOUNTER — Ambulatory Visit: Payer: Self-pay

## 2014-08-28 ENCOUNTER — Ambulatory Visit: Payer: Self-pay

## 2014-09-03 ENCOUNTER — Encounter (HOSPITAL_COMMUNITY): Payer: Self-pay | Admitting: *Deleted

## 2014-09-03 ENCOUNTER — Emergency Department (HOSPITAL_COMMUNITY)
Admission: EM | Admit: 2014-09-03 | Discharge: 2014-09-03 | Disposition: A | Discharge status: Home / Self Care | Payer: Self-pay | Attending: Emergency Medicine | Admitting: Emergency Medicine

## 2014-09-03 ENCOUNTER — Emergency Department (HOSPITAL_COMMUNITY): Payer: Self-pay

## 2014-09-03 ENCOUNTER — Ambulatory Visit: Payer: Self-pay

## 2014-09-03 DIAGNOSIS — F1410 Cocaine abuse, uncomplicated: Secondary | ICD-10-CM | POA: Insufficient documentation

## 2014-09-03 DIAGNOSIS — M79661 Pain in right lower leg: Secondary | ICD-10-CM | POA: Insufficient documentation

## 2014-09-03 DIAGNOSIS — R202 Paresthesia of skin: Secondary | ICD-10-CM | POA: Insufficient documentation

## 2014-09-03 DIAGNOSIS — R519 Headache, unspecified: Secondary | ICD-10-CM

## 2014-09-03 DIAGNOSIS — R9389 Abnormal findings on diagnostic imaging of other specified body structures: Secondary | ICD-10-CM

## 2014-09-03 DIAGNOSIS — R930 Abnormal findings on diagnostic imaging of skull and head, not elsewhere classified: Secondary | ICD-10-CM | POA: Insufficient documentation

## 2014-09-03 DIAGNOSIS — R200 Anesthesia of skin: Secondary | ICD-10-CM

## 2014-09-03 DIAGNOSIS — J320 Chronic maxillary sinusitis: Secondary | ICD-10-CM

## 2014-09-03 DIAGNOSIS — F419 Anxiety disorder, unspecified: Secondary | ICD-10-CM | POA: Insufficient documentation

## 2014-09-03 DIAGNOSIS — I10 Essential (primary) hypertension: Secondary | ICD-10-CM | POA: Insufficient documentation

## 2014-09-03 DIAGNOSIS — Z87891 Personal history of nicotine dependence: Secondary | ICD-10-CM | POA: Insufficient documentation

## 2014-09-03 DIAGNOSIS — I671 Cerebral aneurysm, nonruptured: Secondary | ICD-10-CM | POA: Insufficient documentation

## 2014-09-03 DIAGNOSIS — R51 Headache: Secondary | ICD-10-CM

## 2014-09-03 DIAGNOSIS — J0100 Acute maxillary sinusitis, unspecified: Secondary | ICD-10-CM | POA: Insufficient documentation

## 2014-09-03 DIAGNOSIS — F329 Major depressive disorder, single episode, unspecified: Secondary | ICD-10-CM | POA: Insufficient documentation

## 2014-09-03 DIAGNOSIS — E039 Hypothyroidism, unspecified: Secondary | ICD-10-CM | POA: Insufficient documentation

## 2014-09-03 DIAGNOSIS — Z79899 Other long term (current) drug therapy: Secondary | ICD-10-CM | POA: Insufficient documentation

## 2014-09-03 LAB — COMPREHENSIVE METABOLIC PANEL
ALK PHOS: 89 U/L (ref 39–117)
ALT: 15 U/L (ref 0–35)
AST: 20 U/L (ref 0–37)
Albumin: 3.6 g/dL (ref 3.5–5.2)
Anion gap: 11 (ref 5–15)
BUN: 13 mg/dL (ref 6–23)
CO2: 25 mEq/L (ref 19–32)
Calcium: 9.4 mg/dL (ref 8.4–10.5)
Chloride: 104 mEq/L (ref 96–112)
Creatinine, Ser: 0.69 mg/dL (ref 0.50–1.10)
GFR calc non Af Amer: >90 mL/min (ref >90)
GLUCOSE: 90 mg/dL (ref 70–99)
POTASSIUM: 4.3 meq/L (ref 3.7–5.3)
SODIUM: 140 meq/L (ref 137–147)
Total Bilirubin: 0.5 mg/dL (ref 0.3–1.2)
Total Protein: 7.7 g/dL (ref 6.0–8.3)

## 2014-09-03 LAB — CBC WITH DIFFERENTIAL/PLATELET
BASOS ABS: 0 10*3/uL (ref 0.0–0.1)
Basophils Relative: 0 % (ref 0–1)
EOS ABS: 0.2 10*3/uL (ref 0.0–0.7)
Eosinophils Relative: 3 % (ref 0–5)
HCT: 37 % (ref 36.0–46.0)
Hemoglobin: 12 g/dL (ref 12.0–15.0)
LYMPHS ABS: 2.1 10*3/uL (ref 0.7–4.0)
LYMPHS PCT: 31 % (ref 12–46)
MCH: 30.4 pg (ref 26.0–34.0)
MCHC: 32.4 g/dL (ref 30.0–36.0)
MCV: 93.7 fL (ref 78.0–100.0)
Monocytes Absolute: 0.4 10*3/uL (ref 0.1–1.0)
Monocytes Relative: 6 % (ref 3–12)
NEUTROS PCT: 60 % (ref 43–77)
Neutro Abs: 4.1 10*3/uL (ref 1.7–7.7)
Platelets: 210 10*3/uL (ref 150–400)
RBC: 3.95 MIL/uL (ref 3.87–5.11)
RDW: 12.6 % (ref 11.5–15.5)
WBC: 6.7 10*3/uL (ref 4.0–10.5)

## 2014-09-03 LAB — URINALYSIS, ROUTINE W REFLEX MICROSCOPIC
Bilirubin Urine: NEGATIVE
GLUCOSE, UA: NEGATIVE mg/dL
Hgb urine dipstick: NEGATIVE
KETONES UR: NEGATIVE mg/dL
LEUKOCYTES UA: NEGATIVE
Nitrite: NEGATIVE
PH: 6 (ref 5.0–8.0)
Protein, ur: NEGATIVE mg/dL
Specific Gravity, Urine: 1.013 (ref 1.005–1.030)
Urobilinogen, UA: 0.2 mg/dL (ref 0.0–1.0)

## 2014-09-03 LAB — RAPID URINE DRUG SCREEN, HOSP PERFORMED
Amphetamines: NOT DETECTED
BENZODIAZEPINES: NOT DETECTED
Barbiturates: NOT DETECTED
Cocaine: POSITIVE — AB
Opiates: NOT DETECTED
Tetrahydrocannabinol: NOT DETECTED

## 2014-09-03 LAB — CBG MONITORING, ED: GLUCOSE-CAPILLARY: 90 mg/dL (ref 70–99)

## 2014-09-03 LAB — PROTIME-INR
INR: 0.98 (ref 0.00–1.49)
Prothrombin Time: 13.1 seconds (ref 11.6–15.2)

## 2014-09-03 MED ORDER — SODIUM CHLORIDE 0.9 % IV BOLUS (SEPSIS)
1000.0000 mL | Freq: Once | INTRAVENOUS | Status: AC
  Administered 2014-09-03: 1000 mL via INTRAVENOUS

## 2014-09-03 MED ORDER — ACETAMINOPHEN 325 MG PO TABS
650.0000 mg | ORAL_TABLET | Freq: Once | ORAL | Status: AC
  Administered 2014-09-03: 650 mg via ORAL
  Filled 2014-09-03: qty 2

## 2014-09-03 MED ORDER — IOHEXOL 350 MG/ML SOLN
50.0000 mL | Freq: Once | INTRAVENOUS | Status: AC | PRN
  Administered 2014-09-03: 50 mL via INTRAVENOUS

## 2014-09-03 NOTE — ED Provider Notes (Signed)
5:45 PM  Assumed care from Dr. Effie ShyWentz.  Pt is a 48 y.o. F with history of migraine headaches who presents to the emergency department with right-sided numbness in her arm and leg. Head CT unremarkable. Also complaining of headache, has h/o migraines. Plan is for MRI of her brain. If negative, anticipate discharge home. Hemodynamically stable.  6:30 PM  Pt unable to tolerate MRI.  States she is claustrophobic. Have offered her medications to help her relax which she refuses.  She was able to complete some of the MRI. MRI read states she does not have an acute infarct but may have a 3 mm left periophthalmic artery aneurysm versus tortuosity. CTA head recommended patient agrees to.  States she has follow-[facility] Rudell [facility]borah Hill [facility]. Have discussed with her privately that if she does have an aneurysm that I have urged her to stop using cocaine. She states that she will stop using this. She reports she only uses it recreationally.   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 Discussed with Dr. Gerlene FeeKritzer with neurosurgery. She is currently asymptomatic with no headache or neurologic deficits. He recommends outpatient follow-up to discuss risk and benefits of surgery with Dr. Conchita ParisNundkumar.  Discussed this with patient. Blood pressures have been controlled in the ED. Have advised her again to avoid using cocaine. She has PCP follow-up in 6 days. Discussed return precautions. She verbalized understanding and is comfortable with plan.  Layla MawKristen N Jessejames Steelman, DO 09/03/14 2135

## 2014-09-03 NOTE — ED Notes (Signed)
Patient states she woke up with a headache and right sided numbness and weakness.  Patient states she got up to use the bathroom and noticed that she could not use her right side.  Patient ambulated in today but states she still feels numb on her right side.  Worse in her right foot.  She continues to have a headache.  Patient has hx of migraines but this is different.  Patient states her pain is posterior right side of her head.  Patient speech is clear.  Airway is patent.  Patient took her regular meds.  No pain meds prior to arrival.  Patient has had po intake and swallowed w/o difficulty.  Patient with no noted facial weakness.  Patient states her right leg does feel heavy.  Patient denies chest pain.

## 2014-09-03 NOTE — ED Provider Notes (Signed)
CSN: [id number]     Arrival date & time 09/03/14  1103 History   First MD Initiated Contact with Patient 09/03/14 1133     Chief Complaint  Patient presents with  . Numbness  . Headache     (Consider location/radiation/quality/duration/timing/severity/associated sxs/prior Treatment) Patient is a 48 y.o. female presenting with headaches. The history is provided by the patient.  Headache  Amy [name] is a 48 y.o. female who presents for evaluation of headache and numbness of the right side of the body.  There is no facial numbness.  She also has pain in her right leg which is associated with the numbness.  These potential neurologic symptoms occurred this morning, as she awakened.  They were not present, last night, when she went to bed.  She has not had this previously.  She has an appointment later today for a new visit, at the outpatient clinic.  She denies fever, chills, nausea, vomiting, chest pain, weakness, anorexia, neck or back pain.  She is taking her usual medications, without relief.  There are no other known mild factors.   Past Medical History  Diagnosis Date  . Depression   . Grave's disease 2007    TSH (08/31/2010) = 0.186 (low), free T4 = 0.91 (WNL)  . Anxiety   . Hypertension   . Postsurgical hypothyroidism   . Raynaud disease 2007   Past Surgical History  Procedure Laterality Date  . Colonoscopy  02/14/2008    done by Dr. Christella Hartigan - small colonic polyp identified, 6 mm in size and per biopsy --> tubular adenoma with no malignancy or high grade dysplasia noted  . Abdominal hysterectomy    . Thyroidectomy  03/08/2012    [facility]; history of Graves' disease with multinodular goiter  . Wisdom tooth extraction     Family History  Problem Relation Age of Onset  . Colon cancer Maternal Grandfather   . Cancer Maternal Grandfather     prostate  . Hypertension Mother   . Thyroid disease Mother   . Lupus Mother   . Hypertension Father    History   Substance Use Topics  . Smoking status: Former Smoker -- 0.30 packs/day for 35 years    Types: Cigarettes    Quit date: 10/13/1996  . Smokeless tobacco: Not on file  . Alcohol Use: No   OB History    Gravida Para Term Preterm AB TAB SAB Ectopic Multiple Living   3 3 2 1  0  0   2     Review of Systems  Neurological: Positive for headaches.  All other systems reviewed and are negative.     Allergies  Adhesive  Home Medications   Prior to Admission medications   Medication Sig Start Date End Date Taking? Authorizing Provider  amitriptyline (ELAVIL) 100 MG tablet Take 1 tablet (100 mg total) by mouth at bedtime. 07/22/14  Yes Ky Barban, MD  levothyroxine (SYNTHROID, LEVOTHROID) 100 MCG tablet Take 1 tablet (100 mcg total) by mouth daily before breakfast. 07/22/14  Yes Ky Barban, MD  metoprolol tartrate (LOPRESSOR) 25 MG tablet Take 1 tablet (25 mg total) by mouth 2 (two) times daily. 07/22/14  Yes Ky Barban, MD  Multiple Vitamin (MULTIVITAMIN WITH MINERALS) TABS tablet Take 1 tablet by mouth daily.   Yes Historical Provider, MD  cetirizine (ZYRTEC) 10 MG tablet Take 1 tablet (10 mg total) by mouth daily. Patient not taking: Reported on 09/03/2014 07/22/14   Ky Barban, MD  sodium chloride (OCEAN) 0.65 % SOLN nasal spray Place 1 spray into both nostrils as needed for congestion. Patient not taking: Reported on 09/03/2014 07/22/14   Ky BarbanSolianny D Kennerly, MD   BP 133/92 mmHg  Pulse 85  Temp(Src) 98.6 F (37 C) (Oral)  Resp 22  Ht 5\' 4"  (1.626 m)  Wt 55 lb 7 oz (25.146 kg)  BMI 9.51 kg/m2  SpO2 100%  LMP 01/26/2000 Physical Exam  Constitutional: She is oriented to person, place, and time. She appears well-developed and well-nourished. No distress.  HENT:  Head: Normocephalic and atraumatic.  Right Ear: External ear normal.  Left Ear: External ear normal.  Eyes: Conjunctivae and EOM are normal. Pupils are equal, round, and reactive to  light.  Neck: Normal range of motion and phonation normal. Neck supple.  Cardiovascular: Normal rate, regular rhythm and normal heart sounds.   Pulmonary/Chest: Effort normal and breath sounds normal. She exhibits no bony tenderness.  Abdominal: Soft. There is no tenderness.  Musculoskeletal: Normal range of motion.  She is tender to palpation of the right lower leg.  There is no deformity of the right leg.  Neurological: She is alert and oriented to person, place, and time. No cranial nerve deficit or sensory deficit. She exhibits normal muscle tone. Coordination normal.  She reports that she has a numb feeling when her right arm, right hand, right leg and right foot are touched.  She has normal range of motion of the arms and legs, bilaterally.    Skin: Skin is warm, dry and intact.  Psychiatric: She has a normal mood and affect. Her behavior is normal. Judgment and thought content normal.  Nursing note and vitals reviewed.   ED Course  Procedures (including critical care time)  11:40- consideration for thrombolysis.  Patient's symptoms present upon awakening, therefore at time of onset is not known for her current symptom complex.  Her NIH stroke score is low.  Thrombolysis will not be given.  13:42- MRI ordered, to evaluate for complication of cocaine abuse, as a possible source for her headache and paresthesia.  At this time, she is complaining of pain in her right leg after being treated with Tylenol.  I explained that it would not be in her best interest at this time to have stronger medication than that.   Medications  acetaminophen (TYLENOL) tablet 650 mg (650 mg Oral Given 09/03/14 1318)    Patient Vitals for the past 24 hrs:  BP Temp Temp src Pulse Resp SpO2 Height Weight  09/03/14 1700 133/92 mmHg - - 85 22 100 % - -  09/03/14 1630 133/90 mmHg - - 85 23 100 % - -  09/03/14 1600 131/82 mmHg 98.6 F (37 C) Oral 86 18 95 % - -  09/03/14 1545 - - - - 19 - - -  09/03/14 1530  118/71 mmHg - - - 23 - - -  09/03/14 1518 - 98.6 F (37 C) Oral - - - - -  09/03/14 1516 133/74 mmHg - - 78 16 100 % - -  09/03/14 1515 - - - 80 - 100 % - -  09/03/14 1230 117/70 mmHg - - - - - - -  09/03/14 1135 - 97.4 F (36.3 C) - - - - - -  09/03/14 1128 - - - - - - 5\' 4"  (1.626 m) 55 lb 7 oz (25.146 kg)  09/03/14 1110 129/81 mmHg 97.4 F (36.3 C) Oral 75 22 95 % - -  Labs Review Labs Reviewed  URINE RAPID DRUG SCREEN (HOSP PERFORMED) - Abnormal; Notable for the following:    Cocaine POSITIVE (*)    All other components within normal limits  CBC WITH DIFFERENTIAL  COMPREHENSIVE METABOLIC PANEL  PROTIME-INR  URINALYSIS, ROUTINE W REFLEX MICROSCOPIC  CBG MONITORING, ED    Imaging Review Ct Head Wo Contrast  09/03/2014   CLINICAL DATA:  Posterior headache beginning this morning.  EXAM: CT HEAD WITHOUT CONTRAST  TECHNIQUE: Contiguous axial images were obtained from the base of the skull through the vertex without intravenous contrast.  COMPARISON:  Head CT scan 09/11/2009 04/09/2007.  FINDINGS: The brain appears normal without hemorrhage, infarct, mass lesion, mass effect, midline shift or abnormal extra-axial fluid collection. There is no hydrocephalus or pneumocephalus.  Visualized left maxillary sinus is completely opacified. Scattered ethmoid air cell disease is also seen. Middle ears and mastoid air cells are clear.  IMPRESSION: No acute intracranial abnormality.  Complete opacification of the visualized left maxillary sinus and scattered ethmoid air cell disease.   Electronically Signed   By: Drusilla Kannerhomas  Dalessio M.D.   On: 09/03/2014 13:15     EKG Interpretation None      MDM   Final diagnoses:  Headache  Cocaine abuse  Paresthesia    Nonspecific headache and paresthesias with right leg pain.  Patient has been abusing cocaine.  She may have a sinusitis which is contributing to her headache.  Advanced imaging ordered for evaluation of occult CVA.  Nursing Notes  Reviewed/ Care Coordinated, and agree without changes. Applicable Imaging Reviewed.  Interpretation of Laboratory Data incorporated into ED treatment  Plan: Reassessment after MRI, and disposition as per oncoming provider team    Flint MelterElliott L Daliya Parchment, MD 09/04/14 [phone]

## 2014-09-03 NOTE — ED Notes (Signed)
Pt back from MRI, not able to complete, pt states she is close-pobic. Pt offered sedative so she can complete the procedure, states she does not want to do it anymore.

## 2014-09-03 NOTE — ED Notes (Signed)
Pt agrees to stay for scan

## 2014-09-03 NOTE — Discharge Instructions (Signed)
Cerebral Aneurysm An aneurysm is the bulging or ballooning out of part of the weakened wall of a vein or artery. An aneurysm in the vein or artery of the brain is called a brain aneurysm, or cerebral aneurysm.  Aneurysms are a risk to your health because they may leak or rupture. Once the aneurysm leaks or ruptures, bleeding occurs. If the bleeding occurs within the brain tissue, the condition is called an intracerebral hemorrhage. An intracerebral hemorrhage can result in a hemorrhagic stroke. If the bleeding occurs in the area between the brain and the thin tissues that cover the brain, the condition is called a subarachnoid hemorrhage. This increases the pressure on the brain and causes some areas of the brain to not get the necessary blood flow. The blood from the ruptured aneurysm collects and presses on the surrounding brain tissue. A subarachnoid hemorrhage can cause a stroke. A ruptured cerebral aneurysm is a medical emergency. This can cause permanent damage and loss of brain function. CAUSES A cerebral aneurysm is caused when a weakened part of the blood vessel expands. The blood vessel expands due to the constant pressure from the flow of blood through the weakened blood vessel. Usually the aneurysm expands slowly. As the weakened aneurysm expands, the walls of the aneurysm become weaker. Aneurysms may be associated with diseases that weaken and damage the walls of your blood vessels or blood vessels that develop abnormally. Some known causes for cerebral aneurysms are:  Head trauma.  Infection.  Use of "recreational drugs" such as cocaine or amphetamines. RISK FACTORS People at risk for a cerebral aneurysm or hemorrhagic stroke usually have one or more risk factors, which include:  Having high blood pressure (hypertension).  Abusing alcohol.  Having abnormal blood vessels present since birth.  Having certain bleeding disorders, such as hemophilia, sickle cell disease, or liver  disease.  Taking blood thinners (anticoagulants).  Smoking. SIGNS AND SYMPTOMS  The signs and symptoms of an unruptured cerebral aneurysm will partly depend on its size and rate of growth. A small, unchanging aneurysm generally does not produce symptoms. A larger aneurysm that is steadily growing can increase pressure on the brain or nerves. That increased pressure from the unruptured cerebral aneurysm can cause:  A headache.  Problems with your vision.  Numbness or weakness in an arm or leg.  Problems with memory.  Problems speaking.  Seizures. If an aneurysm leaks or bursts, it can cause a stroke and be life-threatening. Symptoms may include:  A sudden, severe headache with no known cause. The headache is often described as the worst headache ever experienced.  Nausea or vomiting, especially when combined with other symptoms such as a headache.  Sudden weakness or numbness of the face, arm, or leg, especially on one side of the body.  Sudden trouble walking or difficulty moving arms or legs.  Sudden confusion.  Sudden personality changes.  Trouble speaking (aphasia) or understanding.  Difficulty swallowing.  Sudden trouble seeing in one or both eyes.  Double vision.  Dizziness.  Loss of balance or coordination.  Intolerance to light.  Stiff neck. DIAGNOSIS  A CTA (computed tomographic angiography) may be performed to diagnose an aneurysm. A CTA uses dye and a CT scanner to take images of your blood vessels. An MRA (magnetic resonance angiography) may be used to diagnose an aneurysm. An MRA is performed in an MRI machine. While in the MRI machine, images of your blood vessels are taken. A cerebral aneurysm may also be diagnosed with a  cerebral angiogram. A cerebral angiogram requires a tube called a catheter to be inserted into a blood vessel and advanced to the blood vessels in your neck. Dye is then injected while X-ray images are taken to show the blood vessels in  your brain. TREATMENT  Unruptured Aneurysms Treatment is complex when an aneurysm is found and it is not causing problems. Treatment is very individualized, as each case is different. Many things must be considered, such as the size and exact location of your aneurysm, your age, your overall health, and your feelings and preferences. Small aneurysms in certain locations of the brain have a very low chance of bleeding or rupturing. These small aneurysms may not be treated. However, depending on the size and location of the aneurysm, treatments may be recommended and include:  Coiling. During this procedure, a catheter is inserted and advanced through a blood vessel. Once the catheter reaches the aneurysm, tiny coils are used to block blood flow into the aneurysm.  Surgical clipping. During surgery, a clip is placed at the base of the aneurysm. The clip prevents blood from continuing to enter the aneurysm. Ruptured Aneurysms Immediate emergency surgery may be needed to help prevent damage to the brain and to reduce the risk of rebleeding. Timing of treatment is an important factor in the prevention of complications. Successful early treatment of a ruptured aneurysm (within the first 3 days of a bleed) helps to prevent rebleeding and blood vessel spasm. In some cases, there may be a reason to treat later (10-14 days after a rupture). Many things are considered when making this decision, and each case is handled individually. HOME CARE INSTRUCTIONS  Take medicines only as instructed by your health care provider.  Eat healthy foods. It is recommended that you eat 5 or more servings of fruits and vegetables each day. Foods may need to be a special consistency (soft or pureed), or small bites may need to be taken if you have had a ruptured aneurysm or stroke. Certain dietary changes may be advised to address high blood pressure, high cholesterol, diabetes, or obesity.  Food choices that are low in salt  (sodium), saturated fat, trans fat, and cholesterol are recommended to manage high blood pressure.  Food choices that are high in fiber and low in saturated fat, trans fat, and cholesterol are recommended to control cholesterol levels.  Controlling carbohydrate and sugar intake is recommended to manage diabetes.  Reducing calorie intake and making food choices that are low in sodium, saturated fat, trans fat, and cholesterol are recommended to manage obesity.  Maintain a healthy weight.  Stay physically active. It is recommended that you get at least 30 minutes of activity on most or all days.  Do not smoke.  Limit alcohol use. Moderate alcohol use is considered to be:  No more than 2 drinks each day for men.  No more than 1 drink each day for nonpregnant women.  Stop drug abuse.  A safe home environment is important to reduce the risk of falls. Your health care provider may arrange for specialists to evaluate your home. Having grab bars in the bedroom and bathroom is often important. Your health care provider may arrange for special equipment to be used at home, such as raised toilets and a seat for the shower.  Physical, occupational, and speech therapy. Ongoing therapy may be needed to maximize your recovery after a ruptured aneurysm or stroke. If you have been advised to use a walker or a cane, use  it at all times. Be sure to keep your therapy appointments.  Follow all instructions for follow-up with your health care provider. This is very important. This includes any referrals, physical therapy, rehabilitation, and laboratory tests. Proper follow-up may prevent an aneurysm rupture or a stroke. SEEK IMMEDIATE MEDICAL CARE IF:  You have a sudden, severe headache with no known cause.  You have sudden nausea or vomiting with a severe headache.  You have sudden weakness or numbness of the face, arm, or leg, especially on one side of the body.  You have sudden trouble walking or  difficulty moving arms or legs.  You have sudden confusion.  You have trouble speaking or understanding.  You have sudden trouble seeing in one or both eyes.  You have a sudden loss of balance or coordination.  You have a stiff neck.  You have difficulty breathing.  You have a partial or total loss of consciousness. Any of these symptoms may represent a serious problem that is an emergency. Do not wait to see if the symptoms will go away. Get medical help at once. Call your local emergency services (911 in [place] Do not drive yourself to the hospital. Document Released: 05/29/2002 Document Revised: 01/21/2014 Document Reviewed: 02/22/2013 [facility] Patient Information 2015 [place], [place]. This information is not intended to replace advice given to you by your health care provider. Make sure you discuss any questions you have with your health care provider.   Stimulant Use Disorder-Cocaine Cocaine is one of a group of powerful drugs called stimulants. Cocaine has medical uses for stopping nosebleeds and for pain control before minor nose or dental surgery. However, cocaine is misused because of the effects that it produces. These effects include:   A feeling of extreme pleasure.  Alertness.  High energy. Common street names for cocaine include coke, crack, blow, snow, and nose candy. Cocaine is snorted, dissolved in water and injected, or smoked.  Stimulants are addictive because they activate regions of the brain that produce both the pleasurable sensation of "reward" and psychological dependence. Together, these actions account for loss of control and the rapid development of drug dependence. This means you become ill without the drug (withdrawal) and need to keep using it to function.  Stimulant use disorder is use of stimulants that disrupts your daily life. It disrupts relationships with family and friends and how you do your job. Cocaine increases your blood pressure and heart  rate. It can cause a heart attack or stroke. Cocaine can also cause death from irregular heart rate or seizures. SYMPTOMS Symptoms of stimulant use disorder with cocaine include:  Use of cocaine in larger amounts or over a longer period of time than intended.  Unsuccessful attempts to cut down or control cocaine use.  A lot of time spent obtaining, using, or recovering from the effects of cocaine.  A strong desire or urge to use cocaine (craving).  Continued use of cocaine in spite of major problems at work, school, or home because of use.  Continued use of cocaine in spite of relationship problems because of use.  Giving up or cutting down on important life activities because of cocaine use.  Use of cocaine over and over in situations when it is physically hazardous, such as driving a car.  Continued use of cocaine in spite of a physical problem that is likely related to use. Physical problems can include:  Malnutrition.  Nosebleeds.  Chest pain.  High blood pressure.  A hole that develops between  the part of your nose that separates your nostrils (perforated nasal septum).  Lung and kidney damage.  Continued use of cocaine in spite of a mental problem that is likely related to use. Mental problems can include:  Schizophrenia-like symptoms.  Depression.  Bipolar mood swings.  Anxiety.  Sleep problems.  Need to use more and more cocaine to get the same effect, or lessened effect over time with use of the same amount of cocaine (tolerance).  Having withdrawal symptoms when cocaine use is stopped, or using cocaine to reduce or avoid withdrawal symptoms. Withdrawal symptoms include:  Depressed or irritable mood.  Low energy or restlessness.  Bad dreams.  Poor or excessive sleep.  Increased appetite. DIAGNOSIS Stimulant use disorder is diagnosed by your health care provider. You may be asked questions about your cocaine use and how it affects your life. A  physical exam may be done. A drug screen may be ordered. You may be referred to a mental health professional. The diagnosis of stimulant use disorder requires at least two symptoms within 12 months. The type of stimulant use disorder depends on the number of signs and symptoms you have. The type may be:  Mild. Two or three signs and symptoms.  Moderate. Four or five signs and symptoms.  Severe. Six or more signs and symptoms. TREATMENT Treatment for stimulant use disorder is usually provided by mental health professionals with training in substance use disorders. The following options are available:  Counseling or talk therapy. Talk therapy addresses the reasons you use cocaine and ways to keep you from using again. Goals of talk therapy include:  Identifying and avoiding triggers for use.  Handling cravings.  Replacing use with healthy activities.  Support groups. Support groups provide emotional support, advice, and guidance.  Medicine. Certain medicines may decrease cocaine cravings or withdrawal symptoms. HOME CARE INSTRUCTIONS  Take medicines only as directed by your health care provider.  Identify the people and activities that trigger your cocaine use and avoid them.  Keep all follow-up visits as directed by your health care provider. SEEK MEDICAL CARE IF:  Your symptoms get worse or you relapse.  You are not able to take medicines as directed. SEEK IMMEDIATE MEDICAL CARE IF:  You have serious thoughts about hurting yourself or others.  You have a seizure, chest pain, sudden weakness, or loss of speech or vision. FOR MORE INFORMATION  National Institute on Drug Abuse: http://www.price-smith.com/www.drugabuse.gov  Substance Abuse and Mental Health Services Administration: [employer].com.ptwww.samhsa.gov Document Released: 09/03/2000 Document Revised: 01/21/2014 Document Reviewed: 09/19/2013 [facility] Patient Information 2015 [place]Care, [place]. This information is not intended to replace advice given to you by  your health care provider. Make sure you discuss any questions you have with your health care provider.

## 2014-09-03 NOTE — ED Notes (Signed)
Patient transported to CT 

## 2014-09-03 NOTE — ED Notes (Signed)
I gave the patient a cup of ice water. 

## 2014-09-09 ENCOUNTER — Encounter: Payer: Self-pay | Admitting: Internal Medicine

## 2014-09-09 ENCOUNTER — Ambulatory Visit: Payer: Self-pay

## 2014-09-09 ENCOUNTER — Ambulatory Visit (INDEPENDENT_AMBULATORY_CARE_PROVIDER_SITE_OTHER): Payer: Self-pay | Admitting: Internal Medicine

## 2014-09-09 VITALS — BP 137/97 | HR 90 | Temp 98.2°F | Wt 161.9 lb

## 2014-09-09 DIAGNOSIS — N644 Mastodynia: Secondary | ICD-10-CM

## 2014-09-09 DIAGNOSIS — Z720 Tobacco use: Secondary | ICD-10-CM

## 2014-09-09 DIAGNOSIS — Z0000 Encounter for general adult medical examination without abnormal findings: Secondary | ICD-10-CM

## 2014-09-09 DIAGNOSIS — E890 Postprocedural hypothyroidism: Secondary | ICD-10-CM

## 2014-09-09 DIAGNOSIS — I671 Cerebral aneurysm, nonruptured: Secondary | ICD-10-CM

## 2014-09-09 DIAGNOSIS — I10 Essential (primary) hypertension: Secondary | ICD-10-CM

## 2014-09-09 DIAGNOSIS — F17200 Nicotine dependence, unspecified, uncomplicated: Secondary | ICD-10-CM

## 2014-09-09 DIAGNOSIS — G43009 Migraine without aura, not intractable, without status migrainosus: Secondary | ICD-10-CM

## 2014-09-09 NOTE — Assessment & Plan Note (Signed)
Comes and goes, improved with amitriptyline  which she is taking.

## 2014-09-09 NOTE — Assessment & Plan Note (Signed)
She has been taking synthroid daily for 6 weeks now. Will check TSH today.

## 2014-09-09 NOTE — Assessment & Plan Note (Signed)
  Assessment: Progress toward smoking cessation:  stopped smoking Barriers to progress toward smoking cessation:   stopped  Comments: She quit smoking 7 days ago after finding out about her brain aneurysm  Plan: Instruction/counseling given:  I commended patient for quitting and reviewed strategies for preventing relapses. Educational resources provided:    Self management tools provided:    Medications to assist with smoking cessation:  None Patient agreed to the following self-care plans for smoking cessation:    Other plans: Continue supporting her decision.

## 2014-09-09 NOTE — Assessment & Plan Note (Addendum)
From CTA head on 09/03/14:  IMPRESSION: Bilateral para ophthalmic artery aneurysms are small and correspond well with the MRA.  No significant intracranial stenosis.  She has well controlled BP during this visit. Quit smoking 7 days ago. Reports that she used cocaine occasionally but will abstain from this drug from now on.   -referred to Neurosurgery--[facility]  -She agrees to go to the ED if she has numbness, weakness, changes in vision, or severe headache.

## 2014-09-09 NOTE — Patient Instructions (Signed)
General Instructions: -You have been referred to Neurosurgery. They will call you with the appointment date and time.  -It is very important that you continue taking your blood pressure medications to control your blood pressure.  -Congratulations of quitting smoking! -Follow up with us in 1 month.   Have a wonderful and safe Holiday Season!   Please bring your medicines with you each time you come to clinic.  Medicines may include prescription medications, over-the-counter medications, herbal remedies, eye drops, vitamins, or other pills.   Progress Toward Treatment Goals:  Treatment Goal 09/09/2014  Blood pressure at goal  Stop smoking stopped smoking    Self Care Goals & Plans:  Self Care Goal 09/09/2014  Manage my medications bring my medications to every visit  Eat healthy foods -  Be physically active -  Stop smoking -  Meeting treatment goals maintain the current self-care plan    No flowsheet data found.   Care Management & Community Referrals:  Referral 09/09/2014  Referrals made for care management support none needed

## 2014-09-09 NOTE — Progress Notes (Signed)
   Subjective:    Patient ID: Amy [name], female    DOB: 06/28/1966, 48 y.o.   MRN: [id number]  [name] is a 48 year old woman with PMH of HTN, hypothyroidism, who presents for follow up visit of brain aneurysms recently found. She had headache with right sided weakness and was seen at the ED on 12/15. At that time MRA and CTA angio head revealed bilateral para ophthalmic artery aneurysms are small and correspond well with the MRA. Neurosurgery was consulted while she was in the ED and recommended outpatient follow up. Unfortunately she has not been able to see a Neurosurgeon due to lack of medical insurance ([facility]  She continues to have intermittent headache in the occipital area, sometimes involving her neck but has not have numbness or weakness.  She was found to have cocaine in her system during her ED visit but she tells me that she will "never do cocaine again".  She quit smoking 7 days ago.    Review of Systems  Constitutional: Negative for fever, chills, diaphoresis, activity change, appetite change, fatigue and unexpected weight change.  HENT: Negative for congestion, postnasal drip, rhinorrhea and sinus pressure.   Eyes: Negative for photophobia and visual disturbance.  Respiratory: Negative for cough and shortness of breath.   Cardiovascular: Negative for chest pain and leg swelling.  Neurological: Positive for headaches. Negative for dizziness, tremors, seizures, syncope, speech difficulty, weakness, light-headedness and numbness.  Psychiatric/Behavioral: Negative for agitation.       Objective:   Physical Exam  Constitutional: She is oriented to person, place, and time. She appears well-developed and well-nourished. No distress.  Eyes: Conjunctivae and EOM are normal. Pupils are equal, round, and reactive to light. Right eye exhibits no discharge. Left eye exhibits no discharge. No scleral icterus.  Cardiovascular: Normal rate and  regular rhythm.   Pulmonary/Chest: Effort normal and breath sounds normal. No respiratory distress. She has no wheezes. She has no rales.  Musculoskeletal: She exhibits no edema or tenderness.  Neurological: She is alert and oriented to person, place, and time. She has normal reflexes. Coordination normal.  Skin: Skin is warm and dry. No rash noted. She is not diaphoretic.  Psychiatric: She has a normal mood and affect. Her behavior is normal.  Nursing note and vitals reviewed.         Assessment & Plan:

## 2014-09-09 NOTE — Assessment & Plan Note (Signed)
Will need repeat mammogram in may 2016.

## 2014-09-09 NOTE — Assessment & Plan Note (Signed)
Needs colonoscopy but will pursue Neurosurgery referral first.  Tdap vaccine delayed

## 2014-09-09 NOTE — Assessment & Plan Note (Signed)
BP Readings from Last 3 Encounters:  09/09/14 137/97  09/03/14 158/87  07/22/14 153/97    Lab Results  Component Value Date   NA 140 09/03/2014   K 4.3 09/03/2014   CREATININE 0.69 09/03/2014    Assessment: Blood pressure control: controlled Progress toward BP goal:  at goal Comments: on metoprolol 25mg  BID, has been avoiding nasal decongestants, quit smoking 7 day ago.   Plan: Medications:  continue current medications Educational resources provided:   Self management tools provided:   Other plans: Follow up in 3 months

## 2014-09-10 LAB — TSH: TSH: 7.137 u[IU]/mL — ABNORMAL HIGH (ref 0.350–4.500)

## 2014-09-11 NOTE — Progress Notes (Signed)
Internal Medicine Clinic Attending  Case discussed with Dr. Kennerly soon after the resident saw the patient.  We reviewed the resident's history and exam and pertinent patient test results.  I agree with the assessment, diagnosis, and plan of care documented in the resident's note.  

## 2014-09-15 ENCOUNTER — Other Ambulatory Visit: Payer: Self-pay | Admitting: Internal Medicine

## 2014-09-15 DIAGNOSIS — E039 Hypothyroidism, unspecified: Secondary | ICD-10-CM

## 2014-09-15 MED ORDER — LEVOTHYROXINE SODIUM 125 MCG PO TABS: 125.0000 ug | ORAL_TABLET | Freq: Every day | ORAL | Status: DC

## 2014-09-16 MED ORDER — LEVOTHYROXINE SODIUM 112 MCG PO TABS: 112.0000 ug | ORAL_TABLET | Freq: Every day | ORAL | Status: DC

## 2014-09-16 NOTE — Progress Notes (Signed)
TSH level slightly elevated at 7.137, she assured compliance with her levothyroxine 100mcg daily. Increased the dose to 112mcg daily (125mcg had been sent by error but I called the Pharmacy to clarify the correct dose).   Called the patient and left a message in regards to her TSH level being slightly elevated with the need for a slight increase to 112mcg daily.   Advised the pt to call us at [phone] if she had any questions.

## 2014-09-16 NOTE — Addendum Note (Signed)
Addended by: Sara ChuKENNERLY, [place] D on: 09/16/2014 10:38 AM   Modules accepted: Orders, Medications

## 2014-11-07 ENCOUNTER — Encounter: Payer: Self-pay | Admitting: Internal Medicine

## 2014-11-07 ENCOUNTER — Ambulatory Visit (INDEPENDENT_AMBULATORY_CARE_PROVIDER_SITE_OTHER): Payer: Self-pay | Admitting: Internal Medicine

## 2014-11-07 VITALS — BP 146/93 | HR 72 | Temp 98.0°F | Ht 64.0 in | Wt 164.2 lb

## 2014-11-07 DIAGNOSIS — I671 Cerebral aneurysm, nonruptured: Secondary | ICD-10-CM

## 2014-11-07 DIAGNOSIS — R51 Headache: Secondary | ICD-10-CM

## 2014-11-07 DIAGNOSIS — R519 Headache, unspecified: Secondary | ICD-10-CM

## 2014-11-07 DIAGNOSIS — Z720 Tobacco use: Secondary | ICD-10-CM

## 2014-11-07 DIAGNOSIS — Z0000 Encounter for general adult medical examination without abnormal findings: Secondary | ICD-10-CM

## 2014-11-07 DIAGNOSIS — Z8639 Personal history of other endocrine, nutritional and metabolic disease: Secondary | ICD-10-CM

## 2014-11-07 DIAGNOSIS — I10 Essential (primary) hypertension: Secondary | ICD-10-CM

## 2014-11-07 DIAGNOSIS — F17200 Nicotine dependence, unspecified, uncomplicated: Secondary | ICD-10-CM

## 2014-11-07 DIAGNOSIS — E039 Hypothyroidism, unspecified: Secondary | ICD-10-CM

## 2014-11-07 LAB — TSH: TSH: 17.779 u[IU]/mL — AB (ref 0.350–4.500)

## 2014-11-07 LAB — VITAMIN B12: Vitamin B-12: 587 pg/mL (ref 211–911)

## 2014-11-07 MED ORDER — AMITRIPTYLINE HCL 100 MG PO TABS: 100.0000 mg | ORAL_TABLET | Freq: Every day | ORAL | Status: DC

## 2014-11-07 NOTE — Progress Notes (Signed)
   Subjective:    Patient ID: Amy [name], female    DOB: 03/02/1966, 49 y.o.   MRN: [id number]  [name] is a 49 yr old woman with PMH of HTN, hypothyroidism, brain aneurysms, who presents for follow up visit for hypothyroidism. She states that she has been feeling body aches and tingling in her arms off and on for months now. She thinks this may be related to low vitamin B12 level which she has been treated for in the past when she had similar symptoms.  [facility] who recommended that she quits smoking, abstain from cocaine, and follow up with him in 1 yr with repeat CTA brain at that time.  She reports that she has not used cocaine since last year and is actively trying to quit smoking, smoking only 1-2 cigarettes per day.    Review of Systems  Constitutional: Negative for fever, chills, activity change, appetite change, fatigue and unexpected weight change.  Respiratory: Negative for cough, shortness of breath and wheezing.   Cardiovascular: Negative for chest pain, palpitations and leg swelling.  Gastrointestinal: Negative for abdominal pain.  Genitourinary: Negative for dysuria and difficulty urinating.  Musculoskeletal:       Body aches off and on  Skin: Negative for color change and pallor.  Neurological: Negative for dizziness and light-headedness.       Tingling in hands  Psychiatric/Behavioral: Negative for agitation.       Objective:   Physical Exam  Constitutional: She is oriented to person, place, and time. She appears well-developed and well-nourished. No distress.  Cardiovascular: Normal rate and regular rhythm.   Pulmonary/Chest: Breath sounds normal. No respiratory distress. She has no wheezes. She has no rales.  Abdominal: Soft. There is no tenderness.  Musculoskeletal: She exhibits no edema or tenderness.  Tinel's sign not present bilaterally. 5/5 hand grip bilaterally with intact sensation to light touch. 5/5  strength with arm flexion/extension.    Neurological: She is alert and oriented to person, place, and time. Coordination normal.  Skin: Skin is warm and dry. No rash noted. She is not diaphoretic. No erythema.  Psychiatric: She has a normal mood and affect.  Nursing note and vitals reviewed.         Assessment & Plan:

## 2014-11-07 NOTE — Patient Instructions (Signed)
General Instructions: -Good job on cutting back on smoking! -Continue taking your medications are prescribed.  -We will call you with the results of your labs.  -Follow up in 3 months.    Please bring your medicines with you each time you come to clinic.  Medicines may include prescription medications, over-the-counter medications, herbal remedies, eye drops, vitamins, or other pills.   Progress Toward Treatment Goals:  Treatment Goal 09/09/2014  Blood pressure at goal  Stop smoking stopped smoking    Self Care Goals & Plans:  Self Care Goal 11/07/2014  Manage my medications take my medicines as prescribed; bring my medications to every visit; refill my medications on time; follow the sick day instructions if I am sick  Eat healthy foods eat more vegetables; eat fruit for snacks and desserts; eat baked foods instead of fried foods; eat foods that are low in salt  Be physically active find an activity I enjoy  Stop smoking -  Meeting treatment goals -    No flowsheet data found.   Care Management & Community Referrals:  Referral 09/09/2014  Referrals made for care management support none needed

## 2014-11-08 DIAGNOSIS — Z8639 Personal history of other endocrine, nutritional and metabolic disease: Secondary | ICD-10-CM | POA: Insufficient documentation

## 2014-11-08 NOTE — Assessment & Plan Note (Signed)
She is due for a colonoscopy and was referred to GI during this visit.

## 2014-11-08 NOTE — Assessment & Plan Note (Signed)
Refilled her amitriptyline

## 2014-11-08 NOTE — Assessment & Plan Note (Signed)
  Assessment: Progress toward smoking cessation:   actively trying to quit Barriers to progress toward smoking cessation:   progressing well Comments: she is down to 1-2 cigarettes per day and wants to quit soon.    Plan: Instruction/counseling given:  I counseled patient on the dangers of tobacco use, advised patient to stop smoking, and reviewed strategies to maximize success. Educational resources provided:  [employer](1-800-QUIT-NOW) brochure Self management tools provided:    Medications to assist with smoking cessation:  None Patient agreed to the following self-care plans for smoking cessation:    Other plans: Follow up in 3 months

## 2014-11-08 NOTE — Assessment & Plan Note (Signed)
BP Readings from Last 3 Encounters:  11/07/14 146/93  09/09/14 137/97  09/03/14 158/87    Lab Results  Component Value Date   NA 140 09/03/2014   K 4.3 09/03/2014   CREATININE 0.69 09/03/2014    Assessment: Blood pressure control:  slightly elevated Progress toward BP goal:   slightly elevated Comments: on metoprolol 25mg  BID  Plan: Medications:  continue current medications Educational resources provided: brochure, handout, video Self management tools provided:   Other plans: Follow up in 3 months

## 2014-11-08 NOTE — Assessment & Plan Note (Addendum)
She assures compliance with her synthroid daily, however, her TSH level is 17, higher than 7 last time. Her body aches are likely due to her hypothyroidism.  -Pt encouraged to no skip doses of her synthroid.

## 2014-11-08 NOTE — Assessment & Plan Note (Signed)
She has [facility] who recommends follow up in 1 yr with repeat CTA at that time and smoking cessation and abstinence of cocaine.  -Pt encouraged to continue trying to quit smoking and to avoid cocaine.

## 2014-11-08 NOTE — Assessment & Plan Note (Signed)
Checked her B12 level during this visit given her symptoms of intermittent bilateral numbness.  Her B12 level is wnl at 587.

## 2014-11-12 ENCOUNTER — Telehealth: Payer: Self-pay | Admitting: *Deleted

## 2014-11-12 NOTE — Telephone Encounter (Signed)
error 

## 2014-11-14 NOTE — Progress Notes (Signed)
Internal Medicine Clinic Attending  Case discussed with Dr. Garald BraverKennerly soon after the resident saw the patient.  We reviewed the resident's history and exam and pertinent patient test results.  I agree with the assessment, diagnosis, and plan of care documented in the resident's note. She will likely need follow up with Neurology for her hand tingling/numbness.  Possibly EMG is warranted, depending on Dr. Jorje GuildKennerly's exam.

## 2014-11-18 ENCOUNTER — Other Ambulatory Visit: Payer: Self-pay | Admitting: Internal Medicine

## 2014-11-18 DIAGNOSIS — E039 Hypothyroidism, unspecified: Secondary | ICD-10-CM

## 2014-11-18 MED ORDER — LEVOTHYROXINE SODIUM 125 MCG PO TABS: 125.0000 ug | ORAL_TABLET | Freq: Every day | ORAL | Status: DC

## 2014-11-18 NOTE — Progress Notes (Signed)
I was finally able to talk on the phone with Amy [name]. She tells me that she has been taking her synthroid in the mornings but she has skipped a few doses, maybe even the day before her lab was drawn.  I explained to her that her level was even worse than last time at 17 (was 7 before) and she may need a higher dose of her synthroid since she was having symptoms.   Rx synthroid 125mcg daily, [facility] [place]. She will start taking in on 3/1.   She will follow up with us in 6 weeks for a repeat TSH.

## 2014-11-20 ENCOUNTER — Other Ambulatory Visit: Payer: Self-pay | Admitting: *Deleted

## 2014-11-20 DIAGNOSIS — R51 Headache: Secondary | ICD-10-CM

## 2014-11-20 DIAGNOSIS — I10 Essential (primary) hypertension: Secondary | ICD-10-CM

## 2014-11-20 DIAGNOSIS — R519 Headache, unspecified: Secondary | ICD-10-CM

## 2014-11-20 MED ORDER — AMITRIPTYLINE HCL 100 MG PO TABS: 100.0000 mg | ORAL_TABLET | Freq: Every day | ORAL | Status: DC

## 2014-11-20 MED ORDER — METOPROLOL TARTRATE 25 MG PO TABS: 25.0000 mg | ORAL_TABLET | Freq: Two times a day (BID) | ORAL | Status: DC

## 2014-11-29 ENCOUNTER — Encounter: Payer: Self-pay | Admitting: Gastroenterology

## 2014-12-17 ENCOUNTER — Other Ambulatory Visit: Payer: Self-pay | Admitting: Obstetrics and Gynecology

## 2014-12-17 DIAGNOSIS — R921 Mammographic calcification found on diagnostic imaging of breast: Secondary | ICD-10-CM

## 2014-12-17 DIAGNOSIS — N63 Unspecified lump in unspecified breast: Secondary | ICD-10-CM

## 2015-01-24 ENCOUNTER — Telehealth: Payer: Self-pay | Admitting: *Deleted

## 2015-01-24 NOTE — Telephone Encounter (Signed)
Pt no showed her 200pm PV. LMTRC. Instructed if no call by 5 pm, we will cancel Pv and colon and she needs to call [phone] and RS both. Letter mailed.   Hilda LiasMarie PV

## 2015-01-31 ENCOUNTER — Encounter: Payer: Self-pay | Admitting: *Deleted

## 2015-02-05 ENCOUNTER — Encounter: Payer: Self-pay | Admitting: Gastroenterology

## 2015-02-12 ENCOUNTER — Telehealth (HOSPITAL_COMMUNITY): Payer: Self-pay | Admitting: *Deleted

## 2015-02-12 NOTE — Telephone Encounter (Signed)
Telephoned patient at home # and advised patient to call [facility] and reschedule 6 month follow up. Patient voiced understanding and will call and schedule.

## 2015-02-20 ENCOUNTER — Other Ambulatory Visit: Payer: Self-pay | Admitting: Obstetrics and Gynecology

## 2015-02-20 ENCOUNTER — Ambulatory Visit
Admission: RE | Admit: 2015-02-20 | Discharge: 2015-02-20 | Disposition: A | Discharge status: Home / Self Care | Payer: No Typology Code available for payment source | Source: Ambulatory Visit | Attending: Obstetrics and Gynecology | Admitting: Obstetrics and Gynecology

## 2015-02-20 DIAGNOSIS — N63 Unspecified lump in unspecified breast: Secondary | ICD-10-CM

## 2015-02-20 DIAGNOSIS — R921 Mammographic calcification found on diagnostic imaging of breast: Secondary | ICD-10-CM

## 2015-02-21 ENCOUNTER — Other Ambulatory Visit: Payer: Self-pay

## 2015-02-21 ENCOUNTER — Other Ambulatory Visit: Payer: Self-pay | Admitting: Obstetrics and Gynecology

## 2015-02-21 DIAGNOSIS — R921 Mammographic calcification found on diagnostic imaging of breast: Secondary | ICD-10-CM

## 2015-02-27 ENCOUNTER — Other Ambulatory Visit: Payer: Self-pay | Admitting: Obstetrics and Gynecology

## 2015-02-27 ENCOUNTER — Ambulatory Visit
Admission: RE | Admit: 2015-02-27 | Discharge: 2015-02-27 | Disposition: A | Discharge status: Home / Self Care | Payer: No Typology Code available for payment source | Source: Ambulatory Visit | Attending: Obstetrics and Gynecology | Admitting: Obstetrics and Gynecology

## 2015-02-27 DIAGNOSIS — R921 Mammographic calcification found on diagnostic imaging of breast: Secondary | ICD-10-CM

## 2015-03-10 ENCOUNTER — Telehealth: Payer: Self-pay | Admitting: Internal Medicine

## 2015-03-10 NOTE — Telephone Encounter (Signed)
Call to patient to confirm appointment for 03/11/15 at 10:00 phone number is not working

## 2015-03-11 ENCOUNTER — Ambulatory Visit: Payer: Self-pay

## 2015-03-22 ENCOUNTER — Other Ambulatory Visit: Payer: Self-pay | Admitting: Internal Medicine

## 2015-03-26 ENCOUNTER — Emergency Department (HOSPITAL_COMMUNITY)
Admission: EM | Admit: 2015-03-26 | Discharge: 2015-03-26 | Disposition: A | Discharge status: Home / Self Care | Payer: Self-pay | Attending: Emergency Medicine | Admitting: Emergency Medicine

## 2015-03-26 ENCOUNTER — Emergency Department (HOSPITAL_COMMUNITY): Payer: Self-pay

## 2015-03-26 ENCOUNTER — Encounter (HOSPITAL_COMMUNITY): Payer: Self-pay | Admitting: Emergency Medicine

## 2015-03-26 ENCOUNTER — Emergency Department (HOSPITAL_COMMUNITY): Payer: No Typology Code available for payment source

## 2015-03-26 DIAGNOSIS — R079 Chest pain, unspecified: Secondary | ICD-10-CM

## 2015-03-26 DIAGNOSIS — F419 Anxiety disorder, unspecified: Secondary | ICD-10-CM | POA: Insufficient documentation

## 2015-03-26 DIAGNOSIS — E890 Postprocedural hypothyroidism: Secondary | ICD-10-CM | POA: Insufficient documentation

## 2015-03-26 DIAGNOSIS — F329 Major depressive disorder, single episode, unspecified: Secondary | ICD-10-CM | POA: Insufficient documentation

## 2015-03-26 DIAGNOSIS — Z79899 Other long term (current) drug therapy: Secondary | ICD-10-CM | POA: Insufficient documentation

## 2015-03-26 DIAGNOSIS — R0789 Other chest pain: Secondary | ICD-10-CM | POA: Insufficient documentation

## 2015-03-26 DIAGNOSIS — R000 Tachycardia, unspecified: Secondary | ICD-10-CM | POA: Insufficient documentation

## 2015-03-26 DIAGNOSIS — I10 Essential (primary) hypertension: Secondary | ICD-10-CM | POA: Insufficient documentation

## 2015-03-26 DIAGNOSIS — Z720 Tobacco use: Secondary | ICD-10-CM | POA: Insufficient documentation

## 2015-03-26 DIAGNOSIS — M797 Fibromyalgia: Secondary | ICD-10-CM | POA: Insufficient documentation

## 2015-03-26 DIAGNOSIS — F1410 Cocaine abuse, uncomplicated: Secondary | ICD-10-CM | POA: Insufficient documentation

## 2015-03-26 LAB — BASIC METABOLIC PANEL
Anion gap: 9 (ref 5–15)
BUN: 7 mg/dL (ref 6–20)
CO2: 27 mmol/L (ref 22–32)
CREATININE: 0.62 mg/dL (ref 0.44–1.00)
Calcium: 9.1 mg/dL (ref 8.9–10.3)
Chloride: 103 mmol/L (ref 101–111)
GFR calc non Af Amer: >60 mL/min (ref >60)
Glucose, Bld: 83 mg/dL (ref 65–99)
Potassium: 3.7 mmol/L (ref 3.5–5.1)
Sodium: 139 mmol/L (ref 135–145)

## 2015-03-26 LAB — CBC
HCT: 40.6 % (ref 36.0–46.0)
HEMOGLOBIN: 13.5 g/dL (ref 12.0–15.0)
MCH: 29.5 pg (ref 26.0–34.0)
MCHC: 33.3 g/dL (ref 30.0–36.0)
MCV: 88.6 fL (ref 78.0–100.0)
Platelets: 216 10*3/uL (ref 150–400)
RBC: 4.58 MIL/uL (ref 3.87–5.11)
RDW: 12.4 % (ref 11.5–15.5)
WBC: 6.1 10*3/uL (ref 4.0–10.5)

## 2015-03-26 LAB — URINE MICROSCOPIC-ADD ON

## 2015-03-26 LAB — URINALYSIS, ROUTINE W REFLEX MICROSCOPIC
Bilirubin Urine: NEGATIVE
Glucose, UA: NEGATIVE mg/dL
Hgb urine dipstick: NEGATIVE
Ketones, ur: NEGATIVE mg/dL
Nitrite: NEGATIVE
Protein, ur: NEGATIVE mg/dL
Specific Gravity, Urine: 1.025 (ref 1.005–1.030)
Urobilinogen, UA: 1 mg/dL (ref 0.0–1.0)
pH: 5.5 (ref 5.0–8.0)

## 2015-03-26 LAB — D-DIMER, QUANTITATIVE (NOT AT ARMC): D-Dimer, Quant: 1.08 ug/mL-FEU — ABNORMAL HIGH (ref 0.00–0.48)

## 2015-03-26 LAB — I-STAT TROPONIN, ED: Troponin i, poc: 0 ng/mL (ref 0.00–0.08)

## 2015-03-26 LAB — RAPID URINE DRUG SCREEN, HOSP PERFORMED
Amphetamines: NOT DETECTED
BARBITURATES: NOT DETECTED
Benzodiazepines: NOT DETECTED
COCAINE: POSITIVE — AB
Opiates: NOT DETECTED
Tetrahydrocannabinol: NOT DETECTED

## 2015-03-26 MED ORDER — IBUPROFEN 800 MG PO TABS: 800.0000 mg | ORAL_TABLET | Freq: Three times a day (TID) | ORAL | Status: DC

## 2015-03-26 MED ORDER — SODIUM CHLORIDE 0.9 % IV BOLUS (SEPSIS)
500.0000 mL | Freq: Once | INTRAVENOUS | Status: AC
  Administered 2015-03-26: 500 mL via INTRAVENOUS

## 2015-03-26 MED ORDER — IOHEXOL 350 MG/ML SOLN
80.0000 mL | Freq: Once | INTRAVENOUS | Status: AC | PRN
  Administered 2015-03-26: 100 mL via INTRAVENOUS

## 2015-03-26 MED ORDER — ASPIRIN 325 MG PO TABS
325.0000 mg | ORAL_TABLET | ORAL | Status: AC
  Administered 2015-03-26: 325 mg via ORAL
  Filled 2015-03-26: qty 1

## 2015-03-26 MED ORDER — MORPHINE SULFATE 4 MG/ML IJ SOLN
2.0000 mg | Freq: Once | INTRAMUSCULAR | Status: AC
  Administered 2015-03-26: 2 mg via INTRAVENOUS
  Filled 2015-03-26: qty 1

## 2015-03-26 MED ORDER — DIPHENHYDRAMINE HCL 50 MG/ML IJ SOLN
25.0000 mg | Freq: Once | INTRAMUSCULAR | Status: AC
  Administered 2015-03-26: 25 mg via INTRAMUSCULAR
  Filled 2015-03-26: qty 1

## 2015-03-26 MED ORDER — METHOCARBAMOL 500 MG PO TABS: 500.0000 mg | ORAL_TABLET | Freq: Two times a day (BID) | ORAL | Status: DC

## 2015-03-26 MED ORDER — NITROGLYCERIN 0.4 MG SL SUBL
0.4000 mg | SUBLINGUAL_TABLET | SUBLINGUAL | Status: DC | PRN
  Filled 2015-03-26: qty 1

## 2015-03-26 MED ORDER — METOCLOPRAMIDE HCL 5 MG/ML IJ SOLN
10.0000 mg | Freq: Once | INTRAMUSCULAR | Status: AC
  Administered 2015-03-26: 10 mg via INTRAVENOUS
  Filled 2015-03-26: qty 2

## 2015-03-26 NOTE — ED Notes (Signed)
Patient transported to CT 

## 2015-03-26 NOTE — Discharge Instructions (Signed)
Chest Wall Pain °Chest wall pain is pain in or around the bones and muscles of your chest. It may take up to 6 weeks to get better. It may take longer if you must stay physically active in your work and activities.  °CAUSES  °Chest wall pain may happen on its own. However, it may be caused by: °· A viral illness like the flu. °· Injury. °· Coughing. °· Exercise. °· Arthritis. °· Fibromyalgia. °· Shingles. °HOME CARE INSTRUCTIONS  °· Avoid overtiring physical activity. Try not to strain or perform activities that cause pain. This includes any activities using your chest or your abdominal and side muscles, especially if heavy weights are used. °· Put ice on the sore area. °· Put ice in a plastic bag. °· Place a towel between your skin and the bag. °· Leave the ice on for 15-20 minutes per hour while awake for the first 2 days. °· Only take over-the-counter or prescription medicines for pain, discomfort, or fever as directed by your caregiver. °SEEK IMMEDIATE MEDICAL CARE IF:  °· Your pain increases, or you are very uncomfortable. °· You have a fever. °· Your chest pain becomes worse. °· You have new, unexplained symptoms. °· You have nausea or vomiting. °· You feel sweaty or lightheaded. °· You have a cough with phlegm (sputum), or you cough up blood. °MAKE SURE YOU:  °· Understand these instructions. °· Will watch your condition. °· Will get help right away if you are not doing well or get worse. °Document Released: 09/06/2005 Document Revised: 11/29/2011 Document Reviewed: 05/03/2011 °ExitCare® Patient Information ©2015 [employer]. This information is not intended to replace advice given to you by your health care provider. Make sure you discuss any questions you have with your health care provider. ° °Musculoskeletal Pain °Musculoskeletal pain is muscle and boney aches and pains. These pains can occur in any part of the body. Your caregiver may treat you without knowing the cause of the pain. They may treat you  if blood or urine tests, X-rays, and other tests were normal.  °CAUSES °There is often not a definite cause or reason for these pains. These pains may be caused by a type of germ (virus). The discomfort may also come from overuse. Overuse includes working out too hard when your body is not fit. Boney aches also come from weather changes. Bone is sensitive to atmospheric pressure changes. °HOME CARE INSTRUCTIONS  °· Ask when your test results will be ready. Make sure you get your test results. °· Only take over-the-counter or prescription medicines for pain, discomfort, or fever as directed by your caregiver. If you were given medications for your condition, do not drive, operate machinery or power tools, or sign legal documents for 24 hours. Do not drink alcohol. Do not take sleeping pills or other medications that may interfere with treatment. °· Continue all activities unless the activities cause more pain. When the pain lessens, slowly resume normal activities. Gradually increase the intensity and duration of the activities or exercise. °· During periods of severe pain, bed rest may be helpful. Lay or sit in any position that is comfortable. °· Putting ice on the injured area. °¨ Put ice in a bag. °¨ Place a towel between your skin and the bag. °¨ Leave the ice on for 15 to 20 minutes, 3 to 4 times a day. °· Follow up with your caregiver for continued problems and no reason can be found for the pain. If the pain becomes worse   or does not go away, it may be necessary to repeat tests or do additional testing. Your caregiver may need to look further for a possible cause. °SEEK IMMEDIATE MEDICAL CARE IF: °· You have pain that is getting worse and is not relieved by medications. °· You develop chest pain that is associated with shortness or breath, sweating, feeling sick to your stomach (nauseous), or throw up (vomit). °· Your pain becomes localized to the abdomen. °· You develop any new symptoms that seem different  or that concern you. °MAKE SURE YOU:  °· Understand these instructions. °· Will watch your condition. °· Will get help right away if you are not doing well or get worse. °Document Released: 09/06/2005 Document Revised: 11/29/2011 Document Reviewed: 05/11/2013 °ExitCare® Patient Information ©2015 [employer]. This information is not intended to replace advice given to you by your health care provider. Make sure you discuss any questions you have with your health care provider. ° °

## 2015-03-26 NOTE — ED Provider Notes (Signed)
CSN: [id number]     Arrival date & time 03/26/15  1540 History   First MD Initiated Contact with Patient 03/26/15 1605     Chief Complaint  Patient presents with  . Chest Pain     (Consider location/radiation/quality/duration/timing/severity/associated sxs/prior Treatment) HPI   Amy [name] is a 49 year old female, current smoker with history of hypertension, headaches, bilateral para ophthalmic artery aneurysm, fibromyalgias, recommended for stereotatic biopsy of Birads 4  02/20/15, who presents with right-sided chest pain and pressure which began yesterday at approximately 5:30 pm while at rest, with associated diaphoresis and lightheadedness with standing.  Over the next 1-2 hours the pain gradually increased, patient was able to go home from her job, and while caring for her grandchildren the pain eased up until about 2:30 at night, when she was unable to get comfortable in bed.  Pain is located under her right arm, and is worse with arm extension and abduction, also worse with palpation.  Patient denies any shortness of breath, cough, wheeze, palpitations, lower extremity edema, syncope, nausea, vomiting, diarrhea.  She has not had any fevers or chills.  States that she does not feel dehydrated, and she drinks plenty of water.  Although she has had cocaine use in the past, she states she is currently in a treatment program, and denies any illegal drug use and further denies any history of IV drug use.  She also has a headache on the right side, with some photosensitivity, but no visual changes, numbness, tingling, weakness.  The headache began before the right sided chest pain, no associated N/V.   ,  Past Medical History  Diagnosis Date  . Depression   . Grave's disease 2007    TSH (08/31/2010) = 0.186 (low), free T4 = 0.91 (WNL)  . Anxiety   . Hypertension   . Postsurgical hypothyroidism   . Raynaud disease 2007   Past Surgical History  Procedure Laterality Date  . Colonoscopy   02/14/2008    done by Dr. Christella HartiganJacobs - small colonic polyp identified, 6 mm in size and per biopsy --> tubular adenoma with no malignancy or high grade dysplasia noted  . Abdominal hysterectomy    . Thyroidectomy  03/08/2012    [facility]; history of Graves' disease with multinodular goiter  . Wisdom tooth extraction     Family History  Problem Relation Age of Onset  . Colon cancer Maternal Grandfather   . Cancer Maternal Grandfather     prostate  . Hypertension Mother   . Thyroid disease Mother   . Lupus Mother   . Hypertension Father    History  Substance Use Topics  . Smoking status: Current Every Day Smoker -- 0.20 packs/day for 35 years    Types: Cigarettes    Last Attempt to Quit: 10/13/1996  . Smokeless tobacco: Not on file  . Alcohol Use: No   OB History    Gravida Para Term Preterm AB TAB SAB Ectopic Multiple Living   3 3 2 1  0  0   2     Review of Systems 10 Systems reviewed and are negative for acute change except as noted in the HPI.     Allergies  Adhesive  Home Medications   Prior to Admission medications   Medication Sig Start Date End Date Taking? Authorizing Provider  amitriptyline (ELAVIL) 100 MG tablet Take 1 tablet (100 mg total) by mouth at bedtime. 11/20/14  Yes Ky BarbanSolianny D Kennerly, MD  levothyroxine (SYNTHROID, LEVOTHROID) 125 MCG tablet TAKE  ONE TABLET BY MOUTH DAILY BEFORE  BREAKFAST. 03/25/15  Yes Inez Catalina, MD  metoprolol tartrate (LOPRESSOR) 25 MG tablet Take 1 tablet (25 mg total) by mouth 2 (two) times daily. 11/20/14  Yes Ky Barban, MD  cetirizine (ZYRTEC) 10 MG tablet Take 1 tablet (10 mg total) by mouth daily. Patient not taking: Reported on 09/03/2014 07/22/14   Ky Barban, MD  sodium chloride (OCEAN) 0.65 % SOLN nasal spray Place 1 spray into both nostrils as needed for congestion. Patient not taking: Reported on 09/03/2014 07/22/14   Ky Barban, MD   BP 132/72 mmHg  Pulse 87  Temp(Src) 98.9 F (37.2 C)  (Oral)  Resp 18  Ht 5\' 4"  (1.626 m)  Wt 164 lb (74.39 kg)  BMI 28.14 kg/m2  SpO2 100%  LMP 01/26/2000 Physical Exam  Constitutional: She is oriented to person, place, and time. She appears well-developed and well-nourished. No distress.  HENT:  Head: Normocephalic and atraumatic.  Right Ear: External ear normal.  Left Ear: External ear normal.  Nose: Nose normal.  Mouth/Throat: Oropharynx is clear and moist. No oropharyngeal exudate.  Eyes: Conjunctivae and EOM are normal. Pupils are equal, round, and reactive to light. Right eye exhibits no discharge. Left eye exhibits no discharge. No scleral icterus.  Neck: Normal range of motion. Neck supple. No JVD present. No tracheal deviation present. No thyromegaly present.  Cardiovascular: Regular rhythm, normal heart sounds and intact distal pulses.  Tachycardia present.  Exam reveals no gallop and no friction rub.   No murmur heard. Pulmonary/Chest: Effort normal and breath sounds normal. No accessory muscle usage or stridor. No tachypnea. No respiratory distress. She has no decreased breath sounds. She has no wheezes. She has no rhonchi. She has no rales. Chest wall is not dull to percussion. She exhibits bony tenderness. She exhibits no tenderness.  ttp of right ribs inferior axilla   Abdominal: Soft. Bowel sounds are normal. She exhibits no distension and no mass. There is no tenderness. There is no rebound and no guarding.  Musculoskeletal: Normal range of motion. She exhibits no edema or tenderness.  Lymphadenopathy:    She has no cervical adenopathy.  Neurological: She is alert and oriented to person, place, and time. She has normal reflexes. No cranial nerve deficit or sensory deficit. She exhibits normal muscle tone. Coordination and gait normal.  Speech is clear and goal oriented, follows commands Major Cranial nerves without deficit, no facial droop Normal strength in upper and lower extremities bilaterally including dorsiflexion  and plantar flexion, strong and equal grip strength Sensation normal to light and sharp touch Moves extremities without ataxia Normal gait and balance   Skin: Skin is warm and dry. No rash noted. She is not diaphoretic. No erythema. No pallor.  Psychiatric: She has a normal mood and affect. Her behavior is normal. Judgment and thought content normal.  Nursing note and vitals reviewed.   ED Course  Procedures (including critical care time) Labs Review Labs Reviewed  URINALYSIS, ROUTINE W REFLEX MICROSCOPIC (NOT AT [facility]) - Abnormal; Notable for the following:    APPearance CLOUDY (*)    Leukocytes, UA SMALL (*)    All other components within normal limits  URINE RAPID DRUG SCREEN, HOSP PERFORMED - Abnormal; Notable for the following:    Cocaine POSITIVE (*)    All other components within normal limits  D-DIMER, QUANTITATIVE (NOT AT [facility]) - Abnormal; Notable for the following:    D-Dimer, Quant 1.08 (*)  All other components within normal limits  URINE MICROSCOPIC-ADD ON - Abnormal; Notable for the following:    Squamous Epithelial / LPF FEW (*)    Bacteria, UA FEW (*)    Crystals CA OXALATE CRYSTALS (*)    All other components within normal limits  CBC  BASIC METABOLIC PANEL  I-STAT TROPOININ, ED    Imaging Review Dg Chest 2 View  03/26/2015   CLINICAL DATA:  Right-sided chest and shoulder pain for 2 days.  EXAM: CHEST  2 VIEW  COMPARISON:  05/24/2014  FINDINGS: The heart size and mediastinal contours are within normal limits. Both lungs are clear. The visualized skeletal structures are unremarkable.  IMPRESSION: No active cardiopulmonary disease.   Electronically Signed   By: Myles Rosenthal M.D.   On: 03/26/2015 16:46   Ct Angio Chest Pe W/cm &/or Wo Cm  03/26/2015   CLINICAL DATA:  49 year old female with chest pain and shortness of breath.  EXAM: CT ANGIOGRAPHY CHEST WITH CONTRAST  TECHNIQUE: Multidetector CT imaging of the chest was performed using the standard protocol during  bolus administration of intravenous contrast. Multiplanar CT image reconstructions and MIPs were obtained to evaluate the vascular anatomy.  CONTRAST:  OMNIPAQUE IOHEXOL 350 MG/ML SOLN  COMPARISON:  Chest radiograph dated 03/26/2015  FINDINGS: Mild diffuse interstitial prominence preop mildly involving the bases, likely related to atelectatic changes. No focal consolidation no pleural effusion. The central airways are patent.  Stop no CT evidence of pulmonary embolism. The visualized thoracic aorta appear unremarkable. There is slight prominence of the main pulmonary trunk concerning for a degree of pulmonary hypertension. There is mild cardiomegaly. No focal renal lesion. Top-normal bilateral hilar lymph nodes. There is stranding of the anterior mediastinal fat. No fluid collection. Mild degenerative changes of the spine. No acute fracture.  Review of the MIP images confirms the above findings.  IMPRESSION: No CT evidence of pulmonary embolism.  Mild stranding of the anterior mediastinal fat, nonspecific but may be traumatic or related to an inflammatory/infectious process. Correlation with clinical exam recommended. No drainable fluid collection/ abscess.   Electronically Signed   By: Elgie Collard M.D.   On: 03/26/2015 19:55     EKG Interpretation   Date/Time:  Wednesday March 26 2015 15:45:04 EDT Ventricular Rate:  106 PR Interval:  150 QRS Duration: 74 QT Interval:  344 QTC Calculation: 456 R Axis:   74 Text Interpretation:  Sinus tachycardia Right atrial enlargement  Borderline ECG Confirmed by Rubin Payor  MD, Harrold Donath [phone]) on 03/26/2015  4:46:49 PM      MDM   Final diagnoses:  Chest pain   Right sided chest pain, located under armpit - described as pressure, tight w/ associated diaphoresis and lightheadedness  Pt is in no acute distress, but is mildly tachycardic.  DDx   Muscle skeletal - pt does frequent lifting, pain is reproducible, able to worsen pain with palpation under  armpit  Pneumothorax - would be small/spontaneous, less likely - r/o with CXR  PE - pt is PERC + with tachycardia, age, Well's criteria pt is low risk, D-dimer to r/o  MI - i-stat trop pending - , negative   PNA - less likely, R/O with CXR, pt has cough, which is at her baseline, current smoker, no fever, chills, not productive, no fatigue   Headache - rt sided - no neurological deficits - headache cocktail given with good response  Patient's headache is improved with cocktail, heart rate has decreased to the 80s with some  gentle fluids.  Chest x-ray rules out pneumonia, pneumothorax. CT angio negative for PE, shows some mild stranding of anterior mediastinal fat, which does not correlate with physical exam, no signs of infection.  Will treat as MSK strain with ibuprofen muscle relaxers, follow up with PCP if not resolved. Pt is in agreement with with plan.  Admits to using cocaine one week ago, but not since.  Filed Vitals:   03/26/15 2013 03/26/15 2015 03/26/15 2030 03/26/15 2100  BP: 153/79  149/88 132/72  Pulse: 93 96 84 87  Temp:      TempSrc:      Resp:    18  Height:      Weight:      SpO2: 100% 100% 100% 100%   Medications  nitroGLYCERIN (NITROSTAT) SL tablet 0.4 mg (not administered)  aspirin tablet 325 mg (325 mg Oral Given 03/26/15 1624)  morphine 4 MG/ML injection 2 mg (2 mg Intravenous Given 03/26/15 1710)  sodium chloride 0.9 % bolus 500 mL (0 mLs Intravenous Stopped 03/26/15 1740)  sodium chloride 0.9 % bolus 500 mL (0 mLs Intravenous Stopped 03/26/15 1923)  iohexol (OMNIPAQUE) 350 MG/ML injection 80 mL (100 mLs Intravenous Contrast Given 03/26/15 1926)  diphenhydrAMINE (BENADRYL) injection 25 mg (25 mg Intramuscular Given 03/26/15 2012)  metoCLOPramide (REGLAN) injection 10 mg (10 mg Intravenous Given 03/26/15 2012)           Danelle Berry, PA-C 03/31/15 0316  Benjiman Core, MD 04/02/15 9562

## 2015-03-26 NOTE — ED Notes (Signed)
Pt from home for eval of right sided cp that started last and has been constant since, pt states pain radiates to right arm and reports chest tightness. Pt also reports right sided HA that began yesterday as well, denies any weakness at this time. Pt denies taking any pain medicine at this time. nad noted, Pt axox 4.

## 2015-05-21 ENCOUNTER — Other Ambulatory Visit: Payer: Self-pay | Admitting: Nurse Practitioner

## 2015-05-21 DIAGNOSIS — N644 Mastodynia: Secondary | ICD-10-CM

## 2015-06-03 ENCOUNTER — Encounter: Payer: Self-pay | Admitting: Gastroenterology

## 2015-06-24 ENCOUNTER — Other Ambulatory Visit: Payer: Self-pay | Admitting: Nurse Practitioner

## 2015-06-24 DIAGNOSIS — N644 Mastodynia: Secondary | ICD-10-CM

## 2015-07-25 ENCOUNTER — Emergency Department (HOSPITAL_COMMUNITY)
Admission: EM | Admit: 2015-07-25 | Discharge: 2015-07-25 | Disposition: A | Discharge status: Other Acute Inpatient Hospital | Payer: Self-pay | Attending: Emergency Medicine | Admitting: Emergency Medicine

## 2015-07-25 ENCOUNTER — Observation Stay (HOSPITAL_COMMUNITY)
Admission: AD | Admit: 2015-07-25 | Discharge: 2015-07-26 | Disposition: A | Discharge status: Home / Self Care | Payer: Federal, State, Local not specified - Other | Source: Intra-hospital | Attending: Psychiatry | Admitting: Psychiatry

## 2015-07-25 ENCOUNTER — Encounter (HOSPITAL_COMMUNITY): Payer: Self-pay | Admitting: Emergency Medicine

## 2015-07-25 ENCOUNTER — Encounter (HOSPITAL_COMMUNITY): Payer: Self-pay

## 2015-07-25 ENCOUNTER — Other Ambulatory Visit: Payer: Self-pay

## 2015-07-25 DIAGNOSIS — F1494 Cocaine use, unspecified with cocaine-induced mood disorder: Secondary | ICD-10-CM | POA: Diagnosis present

## 2015-07-25 DIAGNOSIS — E538 Deficiency of other specified B group vitamins: Secondary | ICD-10-CM | POA: Insufficient documentation

## 2015-07-25 DIAGNOSIS — F1420 Cocaine dependence, uncomplicated: Secondary | ICD-10-CM | POA: Diagnosis present

## 2015-07-25 DIAGNOSIS — F1424 Cocaine dependence with cocaine-induced mood disorder: Secondary | ICD-10-CM | POA: Insufficient documentation

## 2015-07-25 DIAGNOSIS — Z791 Long term (current) use of non-steroidal anti-inflammatories (NSAID): Secondary | ICD-10-CM | POA: Insufficient documentation

## 2015-07-25 DIAGNOSIS — E890 Postprocedural hypothyroidism: Secondary | ICD-10-CM | POA: Insufficient documentation

## 2015-07-25 DIAGNOSIS — G43909 Migraine, unspecified, not intractable, without status migrainosus: Secondary | ICD-10-CM | POA: Insufficient documentation

## 2015-07-25 DIAGNOSIS — F329 Major depressive disorder, single episode, unspecified: Secondary | ICD-10-CM | POA: Insufficient documentation

## 2015-07-25 DIAGNOSIS — I7300 Raynaud's syndrome without gangrene: Secondary | ICD-10-CM | POA: Insufficient documentation

## 2015-07-25 DIAGNOSIS — F17210 Nicotine dependence, cigarettes, uncomplicated: Secondary | ICD-10-CM | POA: Insufficient documentation

## 2015-07-25 DIAGNOSIS — R4689 Other symptoms and signs involving appearance and behavior: Secondary | ICD-10-CM

## 2015-07-25 DIAGNOSIS — R51 Headache: Secondary | ICD-10-CM

## 2015-07-25 DIAGNOSIS — F419 Anxiety disorder, unspecified: Secondary | ICD-10-CM | POA: Insufficient documentation

## 2015-07-25 DIAGNOSIS — Z720 Tobacco use: Secondary | ICD-10-CM | POA: Insufficient documentation

## 2015-07-25 DIAGNOSIS — R45851 Suicidal ideations: Secondary | ICD-10-CM | POA: Diagnosis not present

## 2015-07-25 DIAGNOSIS — Z79899 Other long term (current) drug therapy: Secondary | ICD-10-CM | POA: Insufficient documentation

## 2015-07-25 DIAGNOSIS — R519 Headache, unspecified: Secondary | ICD-10-CM

## 2015-07-25 DIAGNOSIS — I671 Cerebral aneurysm, nonruptured: Secondary | ICD-10-CM | POA: Insufficient documentation

## 2015-07-25 DIAGNOSIS — I10 Essential (primary) hypertension: Secondary | ICD-10-CM | POA: Insufficient documentation

## 2015-07-25 DIAGNOSIS — J309 Allergic rhinitis, unspecified: Secondary | ICD-10-CM

## 2015-07-25 DIAGNOSIS — R4589 Other symptoms and signs involving emotional state: Secondary | ICD-10-CM

## 2015-07-25 DIAGNOSIS — M797 Fibromyalgia: Secondary | ICD-10-CM | POA: Insufficient documentation

## 2015-07-25 LAB — COMPREHENSIVE METABOLIC PANEL
ALT: 27 U/L (ref 14–54)
AST: 31 U/L (ref 15–41)
Albumin: 3.9 g/dL (ref 3.5–5.0)
Alkaline Phosphatase: 77 U/L (ref 38–126)
Anion gap: 5 (ref 5–15)
BILIRUBIN TOTAL: 0.7 mg/dL (ref 0.3–1.2)
BUN: 12 mg/dL (ref 6–20)
CO2: 30 mmol/L (ref 22–32)
Calcium: 8.8 mg/dL — ABNORMAL LOW (ref 8.9–10.3)
Chloride: 106 mmol/L (ref 101–111)
Creatinine, Ser: 0.58 mg/dL (ref 0.44–1.00)
GFR calc Af Amer: >60 mL/min (ref >60)
Glucose, Bld: 85 mg/dL (ref 65–99)
Potassium: 3.6 mmol/L (ref 3.5–5.1)
Sodium: 141 mmol/L (ref 135–145)
TOTAL PROTEIN: 7.1 g/dL (ref 6.5–8.1)

## 2015-07-25 LAB — ETHANOL

## 2015-07-25 LAB — CBC
HCT: 38.7 % (ref 36.0–46.0)
Hemoglobin: 12.4 g/dL (ref 12.0–15.0)
MCH: 29.7 pg (ref 26.0–34.0)
MCHC: 32 g/dL (ref 30.0–36.0)
MCV: 92.8 fL (ref 78.0–100.0)
PLATELETS: 214 10*3/uL (ref 150–400)
RBC: 4.17 MIL/uL (ref 3.87–5.11)
RDW: 12.5 % (ref 11.5–15.5)
WBC: 6 10*3/uL (ref 4.0–10.5)

## 2015-07-25 LAB — RAPID URINE DRUG SCREEN, HOSP PERFORMED
Amphetamines: NOT DETECTED
Barbiturates: NOT DETECTED
Benzodiazepines: POSITIVE — AB
Cocaine: POSITIVE — AB
OPIATES: NOT DETECTED
Tetrahydrocannabinol: NOT DETECTED

## 2015-07-25 MED ORDER — HYDROXYZINE HCL 25 MG PO TABS
25.0000 mg | ORAL_TABLET | Freq: Four times a day (QID) | ORAL | Status: DC | PRN
  Administered 2015-07-25 – 2015-07-26 (×3): 25 mg via ORAL
  Filled 2015-07-25 (×3): qty 1

## 2015-07-25 MED ORDER — LEVOTHYROXINE SODIUM 125 MCG PO TABS
125.0000 ug | ORAL_TABLET | Freq: Every day | ORAL | Status: DC
  Administered 2015-07-26: 125 ug via ORAL
  Filled 2015-07-25: qty 1

## 2015-07-25 MED ORDER — METOPROLOL TARTRATE 25 MG PO TABS
12.5000 mg | ORAL_TABLET | Freq: Two times a day (BID) | ORAL | Status: DC
  Administered 2015-07-25 – 2015-07-26 (×2): 12.5 mg via ORAL
  Filled 2015-07-25 (×2): qty 1

## 2015-07-25 MED ORDER — GABAPENTIN 600 MG PO TABS
300.0000 mg | ORAL_TABLET | Freq: Two times a day (BID) | ORAL | Status: DC
  Administered 2015-07-25 – 2015-07-26 (×2): 300 mg via ORAL
  Filled 2015-07-25 (×2): qty 1

## 2015-07-25 MED ORDER — ACETAMINOPHEN 325 MG PO TABS
650.0000 mg | ORAL_TABLET | Freq: Four times a day (QID) | ORAL | Status: DC | PRN
  Administered 2015-07-25: 650 mg via ORAL
  Filled 2015-07-25: qty 2

## 2015-07-25 MED ORDER — TRAZODONE HCL 50 MG PO TABS
50.0000 mg | ORAL_TABLET | Freq: Every evening | ORAL | Status: DC | PRN
  Administered 2015-07-25: 50 mg via ORAL
  Filled 2015-07-25: qty 1

## 2015-07-25 MED ORDER — MAGNESIUM HYDROXIDE 400 MG/5ML PO SUSP: 30.0000 mL | Freq: Every day | ORAL | Status: DC | PRN

## 2015-07-25 MED ORDER — HYDROCORTISONE 0.5 % EX CREA
TOPICAL_CREAM | Freq: Two times a day (BID) | CUTANEOUS | Status: DC | PRN
  Administered 2015-07-25: 19:00:00 via TOPICAL
  Filled 2015-07-25 (×2): qty 28.35

## 2015-07-25 MED ORDER — AMITRIPTYLINE HCL 25 MG PO TABS
100.0000 mg | ORAL_TABLET | Freq: Every day | ORAL | Status: DC
  Administered 2015-07-25: 100 mg via ORAL
  Filled 2015-07-25: qty 4

## 2015-07-25 MED ORDER — ALUM & MAG HYDROXIDE-SIMETH 200-200-20 MG/5ML PO SUSP: 30.0000 mL | ORAL | Status: DC | PRN

## 2015-07-25 NOTE — BH Assessment (Signed)
[facility] Assessment Progress Note  Per Nanine MeansJamison Lord, NP, this pt would benefit from [facility] to [facility] at this time.  Thurman CoyerEric Kaplan, RN, [facility].  Pt has signed Voluntary Admission and Consent for Treatment, as well as Consent to Release Information to [facility], and signed forms have been faxed to [facility].  Pt's nurse has been notified, and agrees to send original paperwork along with pt via [facility], and to call report to [phone] or [phone].  Doylene Canninghomas Danford Tat, MA Triage Specialist [phone]

## 2015-07-25 NOTE — ED Notes (Signed)
Report called-[facility] called for transport

## 2015-07-25 NOTE — BH Assessment (Signed)
[facility] Assessment Progress Note  Patient was admitted this date into the Observational Unit for Cocaine and Benzodiazepines detox. Patient is also experiencing some SI with a plan to either overdose or run out into traffic. Patient stated she has been accepted into [facility] on Tuesday and feels if she does not admit herself into the hospital she will "not make it into treatment." Earlene Plateravis NP and Crist FatUmberger LCAS spoke with patient upon admission with patient stating she was afraid if she was discharged she would relapse and harm herself. Patient did state that if she could "get the cocaine out of her system," she would be "fine." Patient will be re-assessed on 07/26/15 to determine disposition.

## 2015-07-25 NOTE — ED Provider Notes (Signed)
CSN: [id number]     Arrival date & time 07/25/15  0801 History   First MD Initiated Contact with Patient 07/25/15 1044     Chief Complaint  Patient presents with  . crack/benzo detox      (Consider location/radiation/quality/duration/timing/severity/associated sxs/prior Treatment) Patient is a 49 y.o. female presenting with altered mental status. The history is provided by the patient (The patient states that she's been thinking about hurting herself because she is addicted to cocaine and Valium).  Altered Mental Status Presenting symptoms: behavior changes   Severity:  Severe Most recent episode:  More than 2 days ago Episode history:  Multiple Timing:  Constant Progression:  Worsening Chronicity:  Recurrent Context: not dementia   Associated symptoms: no abdominal pain, no hallucinations, no headaches, no rash and no seizures     Past Medical History  Diagnosis Date  . Depression   . Grave's disease 2007    TSH (08/31/2010) = 0.186 (low), free T4 = 0.91 (WNL)  . Anxiety   . Hypertension   . Postsurgical hypothyroidism   . Raynaud disease 2007   Past Surgical History  Procedure Laterality Date  . Colonoscopy  02/14/2008    done by Dr. Christella Hartigan - small colonic polyp identified, 6 mm in size and per biopsy --> tubular adenoma with no malignancy or high grade dysplasia noted  . Abdominal hysterectomy    . Thyroidectomy  03/08/2012    [facility]; history of Graves' disease with multinodular goiter  . Wisdom tooth extraction     Family History  Problem Relation Age of Onset  . Colon cancer Maternal Grandfather   . Cancer Maternal Grandfather     prostate  . Hypertension Mother   . Thyroid disease Mother   . Lupus Mother   . Hypertension Father    Social History  Substance Use Topics  . Smoking status: Current Every Day Smoker -- 0.20 packs/day for 35 years    Types: Cigarettes    Last Attempt to Quit: 10/13/1996  . Smokeless tobacco: None  . Alcohol Use: No    OB History    Gravida Para Term Preterm AB TAB SAB Ectopic Multiple Living   0  0   2     Review of Systems  Constitutional: Negative for appetite change and fatigue.  HENT: Negative for congestion, ear discharge and sinus pressure.   Eyes: Negative for discharge.  Respiratory: Negative for cough.   Cardiovascular: Negative for chest pain.  Gastrointestinal: Negative for abdominal pain and diarrhea.  Genitourinary: Negative for frequency and hematuria.  Musculoskeletal: Negative for back pain.  Skin: Negative for rash.  Neurological: Negative for seizures and headaches.  Psychiatric/Behavioral: Positive for dysphoric mood. Negative for hallucinations.      Allergies  Adhesive  Home Medications   Prior to Admission medications   Medication Sig Start Date End Date Taking? Authorizing Provider  amitriptyline (ELAVIL) 100 MG tablet Take 1 tablet (100 mg total) by mouth at bedtime. 11/20/14  Yes Ky Barban, MD  diclofenac (VOLTAREN) 75 MG EC tablet Take 75 mg by mouth 2 (two) times daily.   Yes Historical Provider, MD  gabapentin (NEURONTIN) 300 MG capsule Take 300 mg by mouth 2 (two) times daily.   Yes Historical Provider, MD  levothyroxine (SYNTHROID, LEVOTHROID) 125 MCG tablet TAKE ONE TABLET BY MOUTH DAILY BEFORE  BREAKFAST. 03/25/15  Yes Inez Catalina, MD  metoprolol tartrate (LOPRESSOR) 25 MG tablet Take 1 tablet (25 mg total) by  mouth 2 (two) times daily. 11/20/14  Yes Ky BarbanSolianny D Kennerly, MD  cetirizine (ZYRTEC) 10 MG tablet Take 1 tablet (10 mg total) by mouth daily. Patient not taking: Reported on 09/03/2014 07/22/14   Ky BarbanSolianny D Kennerly, MD  ibuprofen (ADVIL,MOTRIN) 800 MG tablet Take 1 tablet (800 mg total) by mouth 3 (three) times daily. Patient not taking: Reported on 07/25/2015 03/26/15   Danelle BerryLeisa Tapia, PA-C  methocarbamol (ROBAXIN) 500 MG tablet Take 1 tablet (500 mg total) by mouth 2 (two) times daily. Patient not taking: Reported on 07/25/2015 03/26/15    Danelle BerryLeisa Tapia, PA-C  sodium chloride (OCEAN) 0.65 % SOLN nasal spray Place 1 spray into both nostrils as needed for congestion. Patient not taking: Reported on 09/03/2014 07/22/14   Ky BarbanSolianny D Kennerly, MD   BP 156/95 mmHg  Pulse 100  Temp(Src) 98 F (36.7 C) (Oral)  Resp 18  SpO2 100%  LMP 01/26/2000 Physical Exam  Constitutional: She is oriented to person, place, and time. She appears well-developed.  HENT:  Head: Normocephalic.  Eyes: Conjunctivae and EOM are normal. No scleral icterus.  Neck: Neck supple. No thyromegaly present.  Cardiovascular: Normal rate and regular rhythm.  Exam reveals no gallop and no friction rub.   No murmur heard. Pulmonary/Chest: No stridor. She has no wheezes. She has no rales. She exhibits no tenderness.  Abdominal: She exhibits no distension. There is no tenderness. There is no rebound.  Musculoskeletal: Normal range of motion. She exhibits no edema.  Lymphadenopathy:    She has no cervical adenopathy.  Neurological: She is oriented to person, place, and time. She exhibits normal muscle tone. Coordination normal.  Skin: No rash noted. No erythema.  Psychiatric:  Depressed and suicidal    ED Course  Procedures (including critical care time) Labs Review Labs Reviewed  COMPREHENSIVE METABOLIC PANEL - Abnormal; Notable for the following:    Calcium 8.8 (*)    All other components within normal limits  URINE RAPID DRUG SCREEN, HOSP PERFORMED - Abnormal; Notable for the following:    Cocaine POSITIVE (*)    Benzodiazepines POSITIVE (*)    All other components within normal limits  ETHANOL  CBC    Imaging Review No results found. I have personally reviewed and evaluated these images and lab results as part of my medical decision-making.   EKG Interpretation None      MDM   Final diagnoses:  Suicidal behavior   Patient is being transferred to behavior health obs UNIT for depression and suicidal ideations and substance abuse   Bethann BerkshireJoseph  Mick Tanguma, MD 07/25/15 1144

## 2015-07-25 NOTE — BH Assessment (Signed)
Consulted with Dr. Jannifer FranklinAkintayo and Nanine MeansJamison Lord, DNP who [facility] at [facility]. Informed patients nurse of recommendation.   Davina PokeJoVea Antonella Upson, LCSW Therapeutic Triage Specialist [facility] 07/25/2015 9:00 AM

## 2015-07-25 NOTE — Plan of Care (Signed)
[facility]  Reason for Crisis Plan:  Crisis Stabilization   Plan of Care:  Referral for Substance Abuse  Family Support:      Current Living Environment:  Living Arrangements: Alone, Other (Comment) (Pt states "my boyfriend stays with me too".)  Insurance:   Hospital Account    Name Acct ID Class Status Primary Coverage   Amy [name], Amy [name] [id number] BEHAVIORAL HEALTH OBSERVATION [facility] [place] [address]        Guarantor Account (for Hospital Account [id number])    Name Relation to Pt Service Area Active? Acct Type   Amy [name], Amy [name] Self [facility] Yes Behavioral Health   Address Phone       [address] [place], [place] [address] [phone])          Coverage Information (for Hospital Account [id number])    F/O Payor/Plan Precert #   [facility] [address]    Subscriber Subscriber #   Amy [name], Amy [name] [id number]   Address Phone   [address] [place], [place] [address] [phone]      Legal Guardian:     Primary Care Provider:  Karna DupesBilly R Kennedy, MD  Current Outpatient Providers:  [facility]  Psychiatrist:     Counselor/Therapist:     Compliant with Medications:  Yes  Additional Information:   Amy CordialKaryl F Syrena [name] 11/4/20164:53 PM

## 2015-07-25 NOTE — BH Assessment (Addendum)
Assessment Note  Amy [name] is an 49 y.o. female Amy [name] to [facility] voluntarily with her boyfriend requesting detox from cocaine and valium/xanax. Patient states that she called [facility] and they referred her to the hospital for an assessment stating that she would have a bed there on Tuesday. Patient states that she feels that if she is not admitted she would "not be able to make it til Tuesday." Patient states that she is suicidal with no plan or intent  and no previous attempts. Patient denies previous inpatient hospitalizations and current outpatient treatment. Patient states that she was seen at [facility] for treatment for about two months until three months ago when she was discharged for positive drug screens. Patient states that she was clean for 24 years from cocaine and relapsed two years ago. Patient states that she would like to get sober so that she can "love my grandkids like I need to." Patient states that she uses about $60-$80 worth of cocaine daily and last used last night. Patient states that she uses "valium and percocet whenever I can" but states that she last used 2-3 days ago. Patient states that she has court on July 31, 2015 for traffic but denies other pending charges and upcoming court dates. Patient denies HI and a history of being violent towards others. Patient denies AVH.   Patient is alert and oriented x4. Patient was sitting in the chair and made fair eye contact. Patient spoke logically and coherntly. Patient is pleasant and attentive and cooperative. Patient states that she thinks that she may have fleas in her home and she felt that "something is jumping off of her" but was minimally distracted in the assessment after her initial comment. Patient did not appear to be responding to internal stimuli. Patient states that her family and her boyfriend are very supportive. Patient states that seh sleeps three to four hours per night and she does not eat well. Patient states that  she has lost about 13 pounds in the past month due to decreased appetite. Patient became tearful when stating that she ":didn't know what to do" until Tuesday and remained calm and pleasant for the rest of the assessment.  Patient UDS and ETOH not ordered at this time.   Consulted with Dr. Jannifer Franklin and Nanine Means, DNP who recommend observation unit treatment at this time.   Diagnosis: 304.20 Cocaine use disorder, Severe   Past Medical History:  Past Medical History  Diagnosis Date  . Depression   . Grave's disease 2007    TSH (08/31/2010) = 0.186 (low), free T4 = 0.91 (WNL)  . Anxiety   . Hypertension   . Postsurgical hypothyroidism   . Raynaud disease 2007    Past Surgical History  Procedure Laterality Date  . Colonoscopy  02/14/2008    done by Dr. Christella Hartigan - small colonic polyp identified, 6 mm in size and per biopsy --> tubular adenoma with no malignancy or high grade dysplasia noted  . Abdominal hysterectomy    . Thyroidectomy  03/08/2012    [facility]; history of Graves' disease with multinodular goiter  . Wisdom tooth extraction      Family History:  Family History  Problem Relation Age of Onset  . Colon cancer Maternal Grandfather   . Cancer Maternal Grandfather     prostate  . Hypertension Mother   . Thyroid disease Mother   . Lupus Mother   . Hypertension Father     Social History:  reports that she  has been smoking Cigarettes.  She has a 7 pack-year smoking history. She does not have any smokeless tobacco history on file. She reports that she uses illicit drugs (Cocaine). She reports that she does not drink alcohol.  Additional Social History:  Alcohol / Drug Use Pain Medications: See PTA Prescriptions: See PTA Over the Counter: See PTA History of alcohol / drug use?: Yes Longest period of sobriety (when/how long): 24 years Substance #1 Name of Substance 1: Cocaine 1 - Age of First Use: 27 1 - Amount (size/oz): $60-$80 1 - Frequency: Daily 1 -  Duration: 2 years 1 - Last Use / Amount: last night - $100 worth  Substance #2 Name of Substance 2: "Valium and Percocet" 2 - Age of First Use: "for a while" 2 - Amount (size/oz): varies 2 - Frequency: "every chance I get" 2 - Duration: ongoing 2 - Last Use / Amount: two or three days ago  CIWA: CIWA-Ar BP: 156/95 mmHg Pulse Rate: 100 COWS:    Allergies:  Allergies  Allergen Reactions  . Adhesive [Tape] Other (See Comments)    Pulls skin off.    Home Medications:  (Not in a hospital admission)  OB/GYN Status:  Patient's last menstrual period was 01/26/2000.  General Assessment Data [facility] of [facility]: [facility] Assessment: In system Is this a Tele or Face-to-Face Assessment?: Face-to-Face Is this an Initial Assessment or a Re-assessment for this encounter?: Initial Assessment Marital status: Single Is patient pregnant?: No Pregnancy Status: No Living Arrangements: Alone Can pt return to current living arrangement?: Yes Admission Status: Voluntary Is patient capable of signing voluntary admission?: Yes Referral Source: Self/Family/Friend Insurance type: None     Crisis Care Plan Living Arrangements: Alone Name of Psychiatrist: None Name of Therapist: None  Education Status Is patient currently in school?: No Highest grade of school patient has completed: 12th  Risk to self with the past 6 months Suicidal Ideation: Yes-Currently Present Has patient been a risk to self within the past 6 months prior to admission? : No Suicidal Intent: No Has patient had any suicidal intent within the past 6 months prior to admission? : No Is patient at risk for suicide?: Yes Suicidal Plan?: No Has patient had any suicidal plan within the past 6 months prior to admission? : No Access to Means: No What has been your use of drugs/alcohol within the last 12 months?: Cocaine and Percocets and Valiums Previous Attempts/Gestures: No How many times?: 0 Other Self Harm Risks:  Denies Triggers for Past Attempts: None known Intentional Self Injurious Behavior: None Family Suicide History: No Recent stressful life event(s): Other (Comment) ("I can't get off the drugs") Persecutory voices/beliefs?: No Depression: Yes Depression Symptoms: Insomnia, Tearfulness, Isolating, Fatigue, Guilt, Loss of interest in usual pleasures, Feeling worthless/self pity Substance abuse history and/or treatment for substance abuse?: Yes (Care Unit in [place], [place] - 3 months ago) Suicide prevention information given to non-admitted patients: Not applicable  Risk to Others within the past 6 months Homicidal Ideation: No Does patient have any lifetime risk of violence toward others beyond the six months prior to admission? : No Thoughts of Harm to Others: No Current Homicidal Intent: No Current Homicidal Plan: No Access to Homicidal Means: No Identified Victim: Denies History of harm to others?: No Assessment of Violence: None Noted Violent Behavior Description: None reported Does patient have access to weapons?: No Criminal Charges Pending?: No Does patient have a court date: Yes Court Date: 07/31/15 (traffic) Is patient on probation?: No  Psychosis Hallucinations: None noted Delusions: None noted  Mental Status Report Appearance/Hygiene: Unremarkable Eye Contact: Fair Motor Activity: Unremarkable Speech: Logical/coherent Level of Consciousness: Alert Mood: Pleasant Affect: Appropriate to circumstance Anxiety Level: None Thought Processes: Coherent, Relevant Judgement: Unimpaired Orientation: Person, Place, Time, Situation, Appropriate for developmental age Obsessive Compulsive Thoughts/Behaviors: None  Cognitive Functioning Concentration: Decreased Memory: Recent Intact, Remote Intact IQ: Average Insight: Fair Impulse Control: Fair Appetite: Poor Weight Loss: 13 (in 30 days) Sleep: Decreased Total Hours of Sleep: 4 Vegetative Symptoms: None  ADLScreening  [facility] Assessment Services) Patient's cognitive ability adequate to safely complete daily activities?: Yes Patient able to express need for assistance with ADLs?: Yes Independently performs ADLs?: Yes (appropriate for developmental age)  Prior Inpatient Therapy Prior Inpatient Therapy: No Prior Therapy Dates: N/A Prior Therapy Facilty/Provider(s): N/A Reason for Treatment: N/A  Prior Outpatient Therapy Prior Outpatient Therapy: Yes Prior Therapy Dates: 2016 Prior Therapy Facilty/Provider(s): [facility] Reason for Treatment: SA Does patient have an ACCT team?: No Does patient have Intensive In-House Services?  : No Does patient have [facility] services? : No Does patient have P4CC services?: No  ADL Screening (condition at time of admission) Patient's cognitive ability adequate to safely complete daily activities?: Yes Is the patient deaf or have difficulty hearing?: No Does the patient have difficulty seeing, even when wearing glasses/contacts?: No Does the patient have difficulty concentrating, remembering, or making decisions?: No Patient able to express need for assistance with ADLs?: Yes Does the patient have difficulty dressing or bathing?: No Independently performs ADLs?: Yes (appropriate for developmental age) Does the patient have difficulty walking or climbing stairs?: No Weakness of Legs: None Weakness of Arms/Hands: None  Home Assistive Devices/Equipment Home Assistive Devices/Equipment: None  Therapy Consults (therapy consults require a physician order) PT Evaluation Needed: No OT Evalulation Needed: No SLP Evaluation Needed: No Abuse/Neglect Assessment (Assessment to be complete while patient is alone) Physical Abuse: Denies Verbal Abuse: Denies Sexual Abuse: Denies Exploitation of patient/patient's resources: Denies Self-Neglect: Denies Values / Beliefs Cultural Requests During Hospitalization: None Spiritual Requests During Hospitalization: None Consults Spiritual  Care Consult Needed: No Social Work Consult Needed: No Merchant navy officerAdvance Directives (For Healthcare) Does patient have an advance directive?: No Would patient like information on creating an advanced directive?: No - patient declined information    Additional Information 1:1 In Past 12 Months?: No CIRT Risk: No Elopement Risk: No Does patient have medical clearance?: No     Disposition:  Disposition Initial Assessment Completed for this Encounter: Yes  On Site Evaluation by:   Reviewed with Physician:    Sadrac Zeoli 07/25/2015 8:36 AM

## 2015-07-25 NOTE — Progress Notes (Signed)
Nursing Admission Note:  Patient is Voluntary Admission from [facility] with admitting Dx of Cocaine and Benzodiazepine detox requested by patient.  Patient reports she was "clean" from crack cocaine for 24 years, began binging on it again about 2 years ago, and has been daily using it x 3 months. She also reports using illegally obtained benzos to help her "calm down" from the crack Cocaine. Patient states "I just want Katharine back, this isn't me", expresses extreme disgust with self but denies being hopeless or helpless but does state "if [facility]'m discharged [facility], I'm not safe, I'd just as soon die". When asked if she had a plan patient replies "anything that'd work, I just can't stand being on this drug again". Patient does verbally contract for safety and denies HI and AVH. Q 15 minute checks for safety. Patient remains safe on Unit.

## 2015-07-25 NOTE — ED Notes (Signed)
Per pt, states binging on crack and valium/xanax for over 2 years-wants help for addiction

## 2015-07-25 NOTE — Progress Notes (Signed)
D:  Patient resting for short periods of time, wakes and is very talkative.  States hasn't slept x one week. Complains of headache and nervousness. Also states legs itch like being bitten by fleas. A: Given tylenol, dysyrel, Visteril and hydrocortisone cream. R:01:25 Pt awake.  States feels better and meds were effective in helping her rest better. Pt. Returning to sleep. Patient does verbally contract for safety and denies HI and AVH. Q 15 minute checks for safety. Patient remains safe on Unit.

## 2015-07-25 NOTE — H&P (Signed)
[facility]  Assessment Adult  Patient Identification: Amy [name] MRN:  [id number] Date of Evaluation:  07/25/2015 Chief Complaint:  "I am using more and more cocaine these days."  Principal Diagnosis: Cocaine-induced mood disorder ([place]) Diagnosis:   Patient Active Problem List   Diagnosis Date Noted  . Cocaine dependence ([place]) [F14.20] 07/25/2015  . Cocaine-induced mood disorder ([place]) [F14.94] 07/25/2015  . Hx of non anemic vitamin B12 deficiency [Z86.39] 11/08/2014  . Brain aneurysm [I67.1] 09/09/2014  . Tobacco use disorder [F17.200] 07/22/2014  . Rhinitis, allergic [J30.9] 07/22/2014  . Breast pain, right [N64.4] 07/09/2014  . Chest pain, unspecified [R07.9] 07/02/2013  . Low back pain [M54.5] 01/24/2013  . Health care maintenance [Z00.00] 10/16/2012  . Hot flashes [N95.1] 02/08/2012  . Post-surgical hypothyroidism [E89.0] 12/07/2011  . Fibromyalgia muscle pain [M79.7] 03/31/2011  . Migraine [G43.909] 03/31/2011  . Essential hypertension [I10] 11/14/2008  . DEPRESSION [F32.9] 08/10/2006   History of Present Illness::   Amy [name] is an 49 y.o. female who presents to [facility] voluntarily with her boyfriend requesting detox from cocaine and valium/xanax. Patient stated that she called [facility] and they referred her to the hospital for an assessment stating that she would have a bed there on Tuesday. Patient states "I was clean from cocaine for 24 years. Then I relapsed two years ago and have been using more as time went by. I have been on a binge for seven to eight months. I look in the mirror and think this is not me. I live alone and work part time. My kids help me financially. I want to get clean for my grand-kids. I use xanax and valium to keep me going when I do not have cocaine." The patient was cooperative with assessment and appeared motivated to get help with her cocaine use. Denies any psychotic symptoms. She did endorse suicidal ideation to jump in front of a car  or smoke "dope until I die." Her last use of cocaine was this morning at 01:30. Amy [name] admits to being stressed due to her cravings to use cocaine. Patient is requesting medicine to help with her anxiety. She was noted to have old scars to her legs but no active lesions to suggest a dermatological problem. She reports itching on her legs and request hydrocortisone cream. Her urine drug screen is positive for benzos, which she does not report using on a daily basis.   Associated Signs/Symptoms: Depression Symptoms:  depressed mood, anhedonia, psychomotor agitation, feelings of worthlessness/guilt, hopelessness, recurrent thoughts of death, suicidal thoughts with specific plan, anxiety, (Hypo) Manic Symptoms:  Denies Anxiety Symptoms:  Excessive Worry, Psychotic Symptoms: Denies PTSD Symptoms: NA Total Time spent with patient: 30 minutes  Past Psychiatric History: Cocaine Dependence  Risk to Self:   Risk to Others:   Prior Inpatient Therapy:   Prior Outpatient Therapy:    Alcohol Screening:   Substance Abuse History in the last 12 months:  Yes.   Consequences of Substance Abuse: Family Consequences:  Feels ashamed to spend time with family due to drug use Withdrawal Symptoms:   Cramps Headaches Previous Psychotropic Medications: Yes  Psychological Evaluations: No  Past Medical History:  Past Medical History  Diagnosis Date  . Depression   . Grave's disease 2007    TSH (08/31/2010) = 0.186 (low), free T4 = 0.91 (WNL)  . Anxiety   . Hypertension   . Postsurgical hypothyroidism   . Raynaud disease 2007    Past Surgical History  Procedure Laterality Date  .  Colonoscopy  02/14/2008    done by Dr. Ardis Hughs - small colonic polyp identified, 6 mm in size and per biopsy --> tubular adenoma with no malignancy or high grade dysplasia noted  . Abdominal hysterectomy    . Thyroidectomy  03/08/2012    [facility]; history of Graves' disease with multinodular goiter  . Wisdom  tooth extraction     Family History:  Family History  Problem Relation Age of Onset  . Colon cancer Maternal Grandfather   . Cancer Maternal Grandfather     prostate  . Hypertension Mother   . Thyroid disease Mother   . Lupus Mother   . Hypertension Father    Social History:  History  Alcohol Use No     History  Drug Use  . Yes  . Special: Cocaine    Comment: benzos    Social History   Social History  . Marital Status: Single    Spouse Name: N/A  . Number of Children: N/A  . Years of Education: N/A   Social History Main Topics  . Smoking status: Current Every Day Smoker -- 0.20 packs/day for 35 years    Types: Cigarettes    Last Attempt to Quit: 10/13/1996  . Smokeless tobacco: None  . Alcohol Use: No  . Drug Use: Yes    Special: Cocaine     Comment: benzos  . Sexual Activity: Yes    Birth Control/ Protection: Surgical     Comment: not in past two years   Other Topics Concern  . None   Social History Narrative   Financial assistance approved for 80% discount at [facility] and has [facility] card per [employer]   07/24/2010         Additional Social History:    Pain Medications: no Prescriptions: Benzo's bought off the street daily History of alcohol / drug use?: Yes (Pt using crack cocaine daily; use of benzos daily to "come back down" from the crack.) Longest period of sobriety (when/how long): 24 years Negative Consequences of Use: Financial, Legal, Personal relationships, Work / Youth worker (Patient reports "it's ruining my life; I just want Tiarra back, this isn't me".) Withdrawal Symptoms: Agitation, Patient aware of relationship between substance abuse and physical/medical complications, Tachycardia, Aggressive/Assaultive, Other (Comment) (Patient very hyperverbal)                    Allergies:   Allergies  Allergen Reactions  . Adhesive [Tape] Other (See Comments)    Pulls skin off.   Lab Results:  Results for orders placed or performed during the  hospital encounter of 07/25/15 (from the past 48 hour(s))  Comprehensive metabolic panel     Status: Abnormal   Collection Time: 07/25/15 10:16 AM  Result Value Ref Range   Sodium 141 135 - 145 mmol/L   Potassium 3.6 3.5 - 5.1 mmol/L   Chloride 106 101 - 111 mmol/L   CO2 30 22 - 32 mmol/L   Glucose, Bld 85 65 - 99 mg/dL   BUN 12 6 - 20 mg/dL   Creatinine, Ser 0.58 0.44 - 1.00 mg/dL   Calcium 8.8 (L) 8.9 - 10.3 mg/dL   Total Protein 7.1 6.5 - 8.1 g/dL   Albumin 3.9 3.5 - 5.0 g/dL   AST 31 15 - 41 U/L   ALT 27 14 - 54 U/L   Alkaline Phosphatase 77 38 - 126 U/L   Total Bilirubin 0.7 0.3 - 1.2 mg/dL   GFR calc non Af Amer >  60 >60 mL/min   GFR calc Af Amer >60 >60 mL/min    Comment: (NOTE) The eGFR has been calculated using the CKD EPI equation. This calculation has not been validated in all clinical situations. eGFR's persistently <60 mL/min signify possible Chronic Kidney Disease.    Anion gap 5 5 - 15  Ethanol (ETOH)     Status: None   Collection Time: 07/25/15 10:16 AM  Result Value Ref Range   Alcohol, Ethyl (B) <5 <5 mg/dL    Comment:        LOWEST DETECTABLE LIMIT FOR SERUM ALCOHOL IS 5 mg/dL FOR MEDICAL PURPOSES ONLY   CBC     Status: None   Collection Time: 07/25/15 10:16 AM  Result Value Ref Range   WBC 6.0 4.0 - 10.5 K/uL   RBC 4.17 3.87 - 5.11 MIL/uL   Hemoglobin 12.4 12.0 - 15.0 g/dL   HCT 38.7 36.0 - 46.0 %   MCV 92.8 78.0 - 100.0 fL   MCH 29.7 26.0 - 34.0 pg   MCHC 32.0 30.0 - 36.0 g/dL   RDW 12.5 11.5 - 15.5 %   Platelets 214 150 - 400 K/uL  Urine rapid drug screen (hosp performed) (Not at [facility])     Status: Abnormal   Collection Time: 07/25/15 10:20 AM  Result Value Ref Range   Opiates NONE DETECTED NONE DETECTED   Cocaine POSITIVE (A) NONE DETECTED   Benzodiazepines POSITIVE (A) NONE DETECTED   Amphetamines NONE DETECTED NONE DETECTED   Tetrahydrocannabinol NONE DETECTED NONE DETECTED   Barbiturates NONE DETECTED NONE DETECTED    Comment:         DRUG SCREEN FOR MEDICAL PURPOSES ONLY.  IF CONFIRMATION IS NEEDED FOR ANY PURPOSE, NOTIFY LAB WITHIN 5 DAYS.        LOWEST DETECTABLE LIMITS FOR URINE DRUG SCREEN Drug Class       Cutoff (ng/mL) Amphetamine      1000 Barbiturate      200 Benzodiazepine   161 Tricyclics       096 Opiates          300 Cocaine          300 THC              50     Metabolic Disorder Labs:  No results found for: HGBA1C, MPG No results found for: PROLACTIN Lab Results  Component Value Date   CHOL 174 07/02/2013   TRIG 75 07/02/2013   HDL 55 07/02/2013   CHOLHDL 3.2 07/02/2013   VLDL 15 07/02/2013   LDLCALC 104* 07/02/2013   LDLCALC 101* 02/13/2009    Current Medications: Current Facility-Administered Medications  Medication Dose Route Frequency Provider Last Rate Last Dose  . acetaminophen (TYLENOL) tablet 650 mg  650 mg Oral Q6H PRN Niel Hummer, NP      . alum & mag hydroxide-simeth (MAALOX/MYLANTA) 200-200-20 MG/5ML suspension 30 mL  30 mL Oral Q4H PRN Niel Hummer, NP      . amitriptyline (ELAVIL) tablet 100 mg  100 mg Oral QHS Niel Hummer, NP      . gabapentin (NEURONTIN) tablet 300 mg  300 mg Oral BID Niel Hummer, NP   300 mg at 07/25/15 1608  . hydrocortisone cream 0.5 %   Topical BID PRN Niel Hummer, NP      . hydrOXYzine (ATARAX/VISTARIL) tablet 25 mg  25 mg Oral Q6H PRN Niel Hummer, NP   25 mg at 07/25/15 1610  . [  START ON 07/26/2015] levothyroxine (SYNTHROID, LEVOTHROID) tablet 125 mcg  125 mcg Oral QAC breakfast Niel Hummer, NP      . magnesium hydroxide (MILK OF MAGNESIA) suspension 30 mL  30 mL Oral Daily PRN Niel Hummer, NP      . metoprolol tartrate (LOPRESSOR) tablet 12.5 mg  12.5 mg Oral BID Niel Hummer, NP   12.5 mg at 07/25/15 1609  . traZODone (DESYREL) tablet 50 mg  50 mg Oral QHS PRN Niel Hummer, NP       PTA Medications: Prescriptions prior to admission  Medication Sig Dispense Refill Last Dose  . amitriptyline (ELAVIL) 100 MG tablet Take 1 tablet  (100 mg total) by mouth at bedtime. 30 tablet 5 07/24/2015 at Unknown time  . cetirizine (ZYRTEC) 10 MG tablet Take 1 tablet (10 mg total) by mouth daily. (Patient not taking: Reported on 09/03/2014) 30 tablet 2 Not Taking at Unknown time  . diclofenac (VOLTAREN) 75 MG EC tablet Take 75 mg by mouth 2 (two) times daily.   07/24/2015 at Unknown time  . gabapentin (NEURONTIN) 300 MG capsule Take 300 mg by mouth 2 (two) times daily.   07/24/2015 at Unknown time  . ibuprofen (ADVIL,MOTRIN) 800 MG tablet Take 1 tablet (800 mg total) by mouth 3 (three) times daily. (Patient not taking: Reported on 07/25/2015) 21 tablet 0 Not Taking at Unknown time  . levothyroxine (SYNTHROID, LEVOTHROID) 125 MCG tablet TAKE ONE TABLET BY MOUTH DAILY BEFORE  BREAKFAST. 30 tablet 0 Past Week at Unknown time  . metoprolol tartrate (LOPRESSOR) 25 MG tablet Take 1 tablet (25 mg total) by mouth 2 (two) times daily. 180 tablet 3 07/24/2015 at 1100    Musculoskeletal: Strength & Muscle Tone: within normal limits Gait & Station: normal Patient leans: N/A  Psychiatric Specialty Exam: Physical Exam  Review of Systems  Neurological: Positive for headaches.  Psychiatric/Behavioral: Positive for depression, suicidal ideas and substance abuse. Negative for hallucinations and memory loss. The patient is nervous/anxious. The patient does not have insomnia.     Blood pressure 138/79, pulse 107, temperature 98 F (36.7 C), temperature source Oral, resp. rate 22, height '5\' 4"'  (1.626 m), weight 75.388 kg (166 lb 3.2 oz), last menstrual period 01/26/2000, SpO2 100 %.Body mass index is 28.51 kg/(m^2).  General Appearance: Casual  Eye Contact::  Good  Speech:  Clear and Coherent  Volume:  Normal  Mood:  Dysphoric  Affect:  Restricted  Thought Process:  Coherent and Intact  Orientation:  Full (Time, Place, and Person)  Thought Content:  Symptoms, worries  Suicidal Thoughts:  Yes.  with intent/plan  Homicidal Thoughts:  No  Memory:   Immediate;   Good Recent;   Good Remote;   Good  Judgement:  Fair  Insight:  Present  Psychomotor Activity:  Increased and Restlessness  Concentration:  Good  Recall:  Good  Fund of Knowledge:Good  Language: Good  Akathisia:  No  Handed:  Right  AIMS (if indicated):     Assets:  Communication Skills Desire for Improvement Housing Leisure Time Resilience Social Support  ADL's:  Intact  Cognition: WNL  Sleep:      Treatment Plan Summary: Daily contact with patient to assess and evaluate symptoms and progress in treatment and Medication management  Observation Level/Precautions:  Continuous Observation  Laboratory:  CBC Chemistry Profile UDS  Psychotherapy:  Individual   Medications:  Restart home medications, Start Vistaril 25 every six hours prn anxiety   Consultations:  As  needed  Discharge Concerns:  Continued cocaine abuse  Estimated LOS: 24-48 hours  Other:  Increase collateral information from family    Paxson Harrower, NP-C 11/4/20164:10 PM

## 2015-07-26 DIAGNOSIS — F1494 Cocaine use, unspecified with cocaine-induced mood disorder: Secondary | ICD-10-CM | POA: Diagnosis not present

## 2015-07-26 MED ORDER — METOPROLOL TARTRATE 25 MG PO TABS: 25.0000 mg | ORAL_TABLET | Freq: Two times a day (BID) | ORAL | Status: DC

## 2015-07-26 MED ORDER — CETIRIZINE HCL 10 MG PO TABS: 10.0000 mg | ORAL_TABLET | Freq: Every day | ORAL | Status: DC

## 2015-07-26 MED ORDER — AMITRIPTYLINE HCL 100 MG PO TABS: 100.0000 mg | ORAL_TABLET | Freq: Every day | ORAL | Status: DC

## 2015-07-26 MED ORDER — DICLOFENAC SODIUM 75 MG PO TBEC: 75.0000 mg | DELAYED_RELEASE_TABLET | Freq: Two times a day (BID) | ORAL | Status: DC

## 2015-07-26 MED ORDER — LEVOTHYROXINE SODIUM 125 MCG PO TABS: ORAL_TABLET | ORAL | Status: DC

## 2015-07-26 MED ORDER — GABAPENTIN 300 MG PO CAPS: 300.0000 mg | ORAL_CAPSULE | Freq: Two times a day (BID) | ORAL | Status: DC

## 2015-07-26 MED ORDER — HYDROXYZINE HCL 25 MG PO TABS: 25.0000 mg | ORAL_TABLET | Freq: Four times a day (QID) | ORAL | Status: DC | PRN

## 2015-07-26 NOTE — [facility] Counselor (Signed)
[facility] Assessment Progress Note  Pt appears to be in good spirits today. She reports being motivated for treatment. She initially said she was worried about being d/c today b/c she may relapse and not get [facility]. After further conversation, pt indicated that she would be fine if d/c today. She shared that her BF, Darryl, as well as her daughter were very supportive of her and her recovery. Counselor processed with pt about sticking close to them until [facility]. Pt was amenable to this discussion and appeared confident that, if d/c today, she would be fine until her appt.  Pt lives alone, but indicated that her BF lives with her sometimes. Pt verified that her BF does not use drugs. Pt discussed [facility] asking for information being faxed to them to speed up her intake process.  Counselor faxed H&P, assessment, vitals, & [facility] to the attn of Okey RegalCarol, Clinical research associatescreening coordinator [phone]), to support pt ease of acceptance to program.   Johny ShockSamantha M. Ladona Ridgelaylor, MS, NCC, LPCA Counselor

## 2015-07-26 NOTE — Progress Notes (Signed)
Pt discharged home. DC instructions provided and explained. Medications reviewed. Rx given. All questions answered. Pt stable at discharge. Denies SI, HI,AVH. Pt hopeful for inpatient rehab. Reports feeling positive. Pt stable at discharge

## 2015-07-26 NOTE — Discharge Summary (Signed)
[place] [facility] Discharge Summary Note  Patient:  Amy [name] is an 49 y.o., female MRN:  [id number] DOB:  08/07/1966 Patient phone:  [phone] (home)  Patient address:   [place] [address],  Total Time spent with patient: 30 minutes  Date of Admission:  07/25/2015 Date of Discharge: 07/26/2015  Reason for Admission:  Depression related to cocaine abuse  Principal Problem: Cocaine-induced mood disorder [facility]) Discharge Diagnoses: Patient Active Problem List   Diagnosis Date Noted  . Cocaine dependence ([place]) [F14.20] 07/25/2015  . Cocaine-induced mood disorder ([place]) [F14.94] 07/25/2015  . Hx of non anemic vitamin B12 deficiency [Z86.39] 11/08/2014  . Brain aneurysm [I67.1] 09/09/2014  . Tobacco use disorder [F17.200] 07/22/2014  . Rhinitis, allergic [J30.9] 07/22/2014  . Breast pain, right [N64.4] 07/09/2014  . Chest pain, unspecified [R07.9] 07/02/2013  . Low back pain [M54.5] 01/24/2013  . Health care maintenance [Z00.00] 10/16/2012  . Hot flashes [N95.1] 02/08/2012  . Post-surgical hypothyroidism [E89.0] 12/07/2011  . Fibromyalgia muscle pain [M79.7] 03/31/2011  . Migraine [G43.909] 03/31/2011  . Essential hypertension [I10] 11/14/2008  . DEPRESSION [F32.9] 08/10/2006    Musculoskeletal: Strength & Muscle Tone: within normal limits Gait & Station: normal Patient leans: N/A  Psychiatric Specialty Exam: Physical Exam  Review of Systems  Constitutional: Negative.   HENT: Negative.   Eyes: Negative.   Respiratory: Negative.   Cardiovascular: Negative.   Gastrointestinal: Negative.   Genitourinary: Negative.   Musculoskeletal: Negative.   Skin: Negative.   Neurological: Negative.   Endo/Heme/Allergies: Negative.   Psychiatric/Behavioral: Positive for depression (Stable) and substance abuse (History of cocaine addiction ). Negative for suicidal ideas, hallucinations and memory loss. The patient is nervous/anxious (Stable, provided with  vistaril for anxiety ). The patient does not have insomnia.     Blood pressure 124/87, pulse 85, temperature 97.7 F (36.5 C), temperature source Oral, resp. rate 20, height _0  (1.626 m), weight 75.388 kg (166 lb 3.2 oz), last menstrual period 01/26/2000, SpO2 100 %.Body mass index is 28.51 kg/(m^2).  General Appearance: Casual  Eye Contact::  Good  Speech:  Clear and Coherent  Volume:  Normal  Mood:  Euthymic  Affect:  Appropriate  Thought Process:  Coherent, Goal Directed and Intact  Orientation:  Full (Time, Place, and Person)  Thought Content:  Worries about relapsing on cocaine   Suicidal Thoughts:  No  Homicidal Thoughts:  No  Memory:  Immediate;   Good Recent;   Good Remote;   Good  Judgement:  Fair  Insight:  Present  Psychomotor Activity:  Normal  Concentration:  Good  Recall:  Good  Fund of Knowledge:Good  Language: Good  Akathisia:  No  Handed:  Right  AIMS (if indicated):     Assets:  Communication Skills Desire for Improvement Housing Intimacy Leisure Time Physical Health Resilience Social Support  ADL's:  Intact  Cognition: WNL  Sleep:      Have you used any form of tobacco in the last 30 days? (Cigarettes, Smokeless Tobacco, Cigars, and/or Pipes): Yes  Has this patient used any form of tobacco in the last 30 days? (Cigarettes, Smokeless Tobacco, Cigars, and/or Pipes) No  Past Medical History:  Past Medical History  Diagnosis Date  . Depression   . Grave's disease 2007    TSH (08/31/2010) = 0.186 (low), free T4 = 0.91 (WNL)  . Anxiety   . Hypertension   . Postsurgical hypothyroidism   . Raynaud disease 2007    Past Surgical History  Procedure  Laterality Date  . Colonoscopy  02/14/2008    done by Dr. Ardis Hughs - small colonic polyp identified, 6 mm in size and per biopsy --> tubular adenoma with no malignancy or high grade dysplasia noted  . Abdominal hysterectomy    . Thyroidectomy  03/08/2012    [facility]; history of Graves' disease  with multinodular goiter  . Wisdom tooth extraction     Family History:  Family History  Problem Relation Age of Onset  . Colon cancer Maternal Grandfather   . Cancer Maternal Grandfather     prostate  . Hypertension Mother   . Thyroid disease Mother   . Lupus Mother   . Hypertension Father    Social History:  History  Alcohol Use No     History  Drug Use  . Yes  . Special: Cocaine    Comment: benzos    Social History   Social History  . Marital Status: Single    Spouse Name: N/A  . Number of Children: N/A  . Years of Education: N/A   Social History Main Topics  . Smoking status: Current Every Day Smoker -- 0.20 packs/day for 35 years    Types: Cigarettes    Last Attempt to Quit: 10/13/1996  . Smokeless tobacco: None  . Alcohol Use: No  . Drug Use: Yes    Special: Cocaine     Comment: benzos  . Sexual Activity: Yes    Birth Control/ Protection: Surgical     Comment: not in past two years   Other Topics Concern  . None   Social History Narrative   Financial assistance approved for 80% discount at [facility] and has [facility] card per [employer]   07/24/2010         Risk to Self: Is patient at risk for suicide?: No (Pt verbally contracts for safety.) Risk to Others:   Prior Inpatient Therapy:   Prior Outpatient Therapy:    Level of Care:  OP  Hospital Course:    MARAYAH [name] is an 49 y.o. female who presents to [facility] voluntarily with her boyfriend requesting detox from cocaine and valium/xanax. Patient stated that she called [facility] and they referred her to the hospital for an assessment stating that she would have a bed there on Tuesday. Patient states "I was clean from cocaine for 24 years. Then I relapsed two years ago and have been using more as time went by. I have been on a binge for seven to eight months. I look in the mirror and think this is not me. I live alone and work part time. My kids help me financially. I want to get clean for my grand-kids. I use  xanax and valium to keep me going when I do not have cocaine." The patient was cooperative with assessment and appeared motivated to get help with her cocaine use. Denies any psychotic symptoms. She did endorse suicidal ideation to jump in front of a car or smoke "dope until I die." Her last use of cocaine was this morning at 01:30. Preesha admits to being stressed due to her cravings to use cocaine. Patient is requesting medicine to help with her anxiety. She was noted to have old scars to her legs but no active lesions to suggest a dermatological problem. She reports itching on her legs and request hydrocortisone cream. Her urine drug screen is positive for benzos, which she does not report using on a daily basis. The patient was admitted to the [facility] unit  for further monitoring. During assessment on 07/26/2015 the patient denied any further suicidal ideation and expressed optimism about her recovery. The patient continued to worry about relapsing on cocaine before her treatment at [facility] started. Patient was able to recall times when she had successfully worked through cravings. She also planned to stay with her daughter and grandchildren after discharge. Patient worried about going back to her own house because "That is where I used the drugs a lot." Patient was pleasant and cooperative during assessment. She verbalized readiness to discharge today and is looking forward to spend time with family. Dailey has plans to attend church with her family tomorrow. Patient did not express any suicidal ideation today, which was also supported by documentation by nursing staff.   Consults:  None  Significant Diagnostic Studies:  Chemistry profile, CBC, UDS positive for cocaine   Discharge Vitals:   Blood pressure 124/87, pulse 85, temperature 97.7 F (36.5 C), temperature source Oral, resp. rate 20, height _0  (1.626 m), weight 75.388 kg (166 lb 3.2 oz), last menstrual period  01/26/2000, SpO2 100 %. Body mass index is 28.51 kg/(m^2). Lab Results:   Results for orders placed or performed during the hospital encounter of 07/25/15 (from the past 72 hour(s))  Comprehensive metabolic panel     Status: Abnormal   Collection Time: 07/25/15 10:16 AM  Result Value Ref Range   Sodium 141 135 - 145 mmol/L   Potassium 3.6 3.5 - 5.1 mmol/L   Chloride 106 101 - 111 mmol/L   CO2 30 22 - 32 mmol/L   Glucose, Bld 85 65 - 99 mg/dL   BUN 12 6 - 20 mg/dL   Creatinine, Ser 0.58 0.44 - 1.00 mg/dL   Calcium 8.8 (L) 8.9 - 10.3 mg/dL   Total Protein 7.1 6.5 - 8.1 g/dL   Albumin 3.9 3.5 - 5.0 g/dL   AST 31 15 - 41 U/L   ALT 27 14 - 54 U/L   Alkaline Phosphatase 77 38 - 126 U/L   Total Bilirubin 0.7 0.3 - 1.2 mg/dL   GFR calc non Af Amer >60 >60 mL/min   GFR calc Af Amer >60 >60 mL/min    Comment: (NOTE) The eGFR has been calculated using the CKD EPI equation. This calculation has not been validated in all clinical situations. eGFR's persistently <60 mL/min signify possible Chronic Kidney Disease.    Anion gap 5 5 - 15  Ethanol (ETOH)     Status: None   Collection Time: 07/25/15 10:16 AM  Result Value Ref Range   Alcohol, Ethyl (B) <5 <5 mg/dL    Comment:        LOWEST DETECTABLE LIMIT FOR SERUM ALCOHOL IS 5 mg/dL FOR MEDICAL PURPOSES ONLY   CBC     Status: None   Collection Time: 07/25/15 10:16 AM  Result Value Ref Range   WBC 6.0 4.0 - 10.5 K/uL   RBC 4.17 3.87 - 5.11 MIL/uL   Hemoglobin 12.4 12.0 - 15.0 g/dL   HCT 38.7 36.0 - 46.0 %   MCV 92.8 78.0 - 100.0 fL   MCH 29.7 26.0 - 34.0 pg   MCHC 32.0 30.0 - 36.0 g/dL   RDW 12.5 11.5 - 15.5 %   Platelets 214 150 - 400 K/uL  Urine rapid drug screen (hosp performed) (Not at [facility])     Status: Abnormal   Collection Time: 07/25/15 10:20 AM  Result Value Ref Range   Opiates NONE DETECTED NONE DETECTED  Cocaine POSITIVE (A) NONE DETECTED   Benzodiazepines POSITIVE (A) NONE DETECTED   Amphetamines NONE DETECTED NONE  DETECTED   Tetrahydrocannabinol NONE DETECTED NONE DETECTED   Barbiturates NONE DETECTED NONE DETECTED    Comment:        DRUG SCREEN FOR MEDICAL PURPOSES ONLY.  IF CONFIRMATION IS NEEDED FOR ANY PURPOSE, NOTIFY LAB WITHIN 5 DAYS.        LOWEST DETECTABLE LIMITS FOR URINE DRUG SCREEN Drug Class       Cutoff (ng/mL) Amphetamine      1000 Barbiturate      200 Benzodiazepine   741 Tricyclics       638 Opiates          300 Cocaine          300 THC              50     Physical Findings: AIMS: Facial and Oral Movements Muscles of Facial Expression: None, normal Lips and Perioral Area: None, normal Jaw: None, normal Tongue: None, normal,Extremity Movements Upper (arms, wrists, hands, fingers): None, normal Lower (legs, knees, ankles, toes): None, normal, Trunk Movements Neck, shoulders, hips: None, normal, Overall Severity Severity of abnormal movements (highest score from questions above): None, normal Incapacitation due to abnormal movements: None, normal Patient's awareness of abnormal movements (rate only patient's report): No Awareness, Dental Status Current problems with teeth and/or dentures?: No Does patient usually wear dentures?: No  CIWA:  CIWA-Ar Total: 4 COWS:  COWS Total Score: 5   See Psychiatric Specialty Exam and Suicide Risk Assessment completed by Attending Physician prior to discharge.  Discharge destination:  Home  Is patient on multiple antipsychotic therapies at discharge:  No   Has Patient had three or more failed trials of antipsychotic monotherapy by history:  No    Recommended Plan for Multiple Antipsychotic Therapies: NA     Medication List    STOP taking these medications        ibuprofen 800 MG tablet  Commonly known as:  ADVIL,MOTRIN      TAKE these medications      Indication   amitriptyline 100 MG tablet  Commonly known as:  ELAVIL  Take 1 tablet (100 mg total) by mouth at bedtime.   Indication:  Depression, Trouble Sleeping      cetirizine 10 MG tablet  Commonly known as:  ZYRTEC  Take 1 tablet (10 mg total) by mouth daily.   Indication:  Perennial Rhinitis     diclofenac 75 MG EC tablet  Commonly known as:  VOLTAREN  Take 1 tablet (75 mg total) by mouth 2 (two) times daily.   Indication:  Backache     gabapentin 300 MG capsule  Commonly known as:  NEURONTIN  Take 1 capsule (300 mg total) by mouth 2 (two) times daily.   Indication:  Nerve Pain     hydrOXYzine 25 MG tablet  Commonly known as:  ATARAX/VISTARIL  Take 1 tablet (25 mg total) by mouth every 6 (six) hours as needed for anxiety.   Indication:  Anxiety Neurosis, Sedation, Tension     levothyroxine 125 MCG tablet  Commonly known as:  SYNTHROID, LEVOTHROID  TAKE ONE TABLET BY MOUTH DAILY BEFORE  BREAKFAST.   Indication:  Underactive Thyroid     metoprolol tartrate 25 MG tablet  Commonly known as:  LOPRESSOR  Take 1 tablet (25 mg total) by mouth 2 (two) times daily.   Indication:  Feeling Anxious, High Blood Pressure  Follow-up Information    Follow up with [facility] on 07/29/2015.   Why:  SCREENING APPT (referral information has been faxed on 07/26/15)   Contact information:   [address], [place] [address]      Follow-up recommendations:   As above  Comments:   Take all your medications as prescribed by your mental healthcare provider.  Report any adverse effects and or reactions from your medicines to your outpatient provider promptly.  Patient is instructed and cautioned to not engage in alcohol and or illegal drug use while on prescription medicines.  In the event of worsening symptoms, patient is instructed to call the crisis hotline, 911 and or go to the nearest ED for appropriate evaluation and treatment of symptoms.  Follow-up with your primary care provider for your other medical issues, concerns and or health care needs.   Total Discharge Time: Greater than 30  minutes  Signed: Elmarie Shiley, NP-C 07/26/2015, 3:50 PM

## 2015-08-06 ENCOUNTER — Emergency Department (HOSPITAL_BASED_OUTPATIENT_CLINIC_OR_DEPARTMENT_OTHER)
Admission: EM | Admit: 2015-08-06 | Discharge: 2015-08-06 | Disposition: A | Discharge status: Home / Self Care | Payer: Self-pay | Attending: Emergency Medicine | Admitting: Emergency Medicine

## 2015-08-06 ENCOUNTER — Encounter (HOSPITAL_BASED_OUTPATIENT_CLINIC_OR_DEPARTMENT_OTHER): Payer: Self-pay | Admitting: *Deleted

## 2015-08-06 DIAGNOSIS — Z791 Long term (current) use of non-steroidal anti-inflammatories (NSAID): Secondary | ICD-10-CM | POA: Insufficient documentation

## 2015-08-06 DIAGNOSIS — Z760 Encounter for issue of repeat prescription: Secondary | ICD-10-CM | POA: Insufficient documentation

## 2015-08-06 DIAGNOSIS — I7300 Raynaud's syndrome without gangrene: Secondary | ICD-10-CM | POA: Insufficient documentation

## 2015-08-06 DIAGNOSIS — R51 Headache: Secondary | ICD-10-CM | POA: Insufficient documentation

## 2015-08-06 DIAGNOSIS — F17210 Nicotine dependence, cigarettes, uncomplicated: Secondary | ICD-10-CM | POA: Insufficient documentation

## 2015-08-06 DIAGNOSIS — Z8669 Personal history of other diseases of the nervous system and sense organs: Secondary | ICD-10-CM | POA: Insufficient documentation

## 2015-08-06 DIAGNOSIS — Z79899 Other long term (current) drug therapy: Secondary | ICD-10-CM | POA: Insufficient documentation

## 2015-08-06 DIAGNOSIS — I10 Essential (primary) hypertension: Secondary | ICD-10-CM | POA: Insufficient documentation

## 2015-08-06 DIAGNOSIS — R519 Headache, unspecified: Secondary | ICD-10-CM

## 2015-08-06 DIAGNOSIS — F419 Anxiety disorder, unspecified: Secondary | ICD-10-CM | POA: Insufficient documentation

## 2015-08-06 DIAGNOSIS — E0500 Thyrotoxicosis with diffuse goiter without thyrotoxic crisis or storm: Secondary | ICD-10-CM | POA: Insufficient documentation

## 2015-08-06 DIAGNOSIS — F329 Major depressive disorder, single episode, unspecified: Secondary | ICD-10-CM | POA: Insufficient documentation

## 2015-08-06 HISTORY — DX: Cerebral aneurysm, nonruptured: I67.1

## 2015-08-06 MED ORDER — KETOROLAC TROMETHAMINE 60 MG/2ML IM SOLN
60.0000 mg | Freq: Once | INTRAMUSCULAR | Status: AC
  Administered 2015-08-06: 60 mg via INTRAMUSCULAR
  Filled 2015-08-06: qty 2

## 2015-08-06 MED ORDER — LEVOTHYROXINE SODIUM 125 MCG PO TABS: 125.0000 ug | ORAL_TABLET | Freq: Every day | ORAL | Status: DC

## 2015-08-06 NOTE — ED Notes (Signed)
Pt reports headache x 2 days and concerned her b/p is elevated. [facility]. States "8th day clean"

## 2015-08-06 NOTE — Discharge Instructions (Signed)
Take the prescribed medication as directed. °Follow-up with your primary care physician. °Return to the ED for new or worsening symptoms. ° °

## 2015-08-06 NOTE — ED Provider Notes (Signed)
CSN: [id number]     Arrival date & time 08/06/15  1715 History   First MD Initiated Contact with Patient 08/06/15 1744     Chief Complaint  Patient presents with  . Headache     (Consider location/radiation/quality/duration/timing/severity/associated sxs/prior Treatment) Patient is a 49 y.o. female presenting with headaches. The history is provided by the patient and medical records.  Headache   49 year old female with history of depression, Graves' disease, anxiety, hypertension, Raynaud's disease, presenting to the ED for headache.  Patient states for the past 2 days she has been having headaches which she describes as mild, states it is a dull ache across her entire forehead. She states her blood pressure has been elevated higher than normal and she feels this is why she is having headaches. Earlier today when headache was more intense, BP was 170/100.  She denies any visual disturbance, photophobia, phonophobia, nausea, vomiting, confusion, changes in speech, numbness, weakness, or gait disturbance.  Patient does have history of migraine headaches, however states she has not had one in several years. She states she does have history of headaches when her blood pressure is elevated. Patient also has history of small brain aneurysms. She states this is being monitored by neurologist at [facility], she is scheduled to follow-up next year for repeat MRI for monitoring of sizing. She is not currently on any type of anticoagulation.  BP is not significantly elevated on arrival, 151/98.  Patient is currently undergoing rehabilitation for substance abuse at [facility]  Patient was clean from cocaine for approx 25 years, but relapsed 2 years ago due to personal issues.  She is now 8 days sober.  Patient states she also needs refill of her Synthroid. She has been out of this for approximately 1 week.  Past Medical History  Diagnosis Date  . Depression   . Grave's disease 2007    TSH (08/31/2010) = 0.186  (low), free T4 = 0.91 (WNL)  . Anxiety   . Hypertension   . Postsurgical hypothyroidism   . Raynaud disease 2007  . Brain aneurysm    Past Surgical History  Procedure Laterality Date  . Colonoscopy  02/14/2008    done by Dr. Christella Hartigan - small colonic polyp identified, 6 mm in size and per biopsy --> tubular adenoma with no malignancy or high grade dysplasia noted  . Abdominal hysterectomy    . Thyroidectomy  03/08/2012    [facility]; history of Graves' disease with multinodular goiter  . Wisdom tooth extraction     Family History  Problem Relation Age of Onset  . Colon cancer Maternal Grandfather   . Cancer Maternal Grandfather     prostate  . Hypertension Mother   . Thyroid disease Mother   . Lupus Mother   . Hypertension Father    Social History  Substance Use Topics  . Smoking status: Current Every Day Smoker -- 0.20 packs/day for 35 years    Types: Cigarettes    Last Attempt to Quit: 10/13/1996  . Smokeless tobacco: None  . Alcohol Use: No   OB History    Gravida Para Term Preterm AB TAB SAB Ectopic Multiple Living   0  0   2     Review of Systems  Neurological: Positive for headaches.  All other systems reviewed and are negative.     Allergies  Adhesive  Home Medications   Prior to Admission medications   Medication Sig Start Date End Date Taking? Authorizing Provider  amitriptyline (ELAVIL) 100 MG tablet Take 1 tablet (100 mg total) by mouth at bedtime. 07/26/15  Yes Thermon Leyland, NP  diclofenac (VOLTAREN) 50 MG EC tablet Take 50 mg by mouth 2 (two) times daily.   Yes Historical Provider, MD  gabapentin (NEURONTIN) 300 MG capsule Take 1 capsule (300 mg total) by mouth 2 (two) times daily. 07/26/15  Yes Thermon Leyland, NP  hydrochlorothiazide (HYDRODIURIL) 12.5 MG tablet Take 12.5 mg by mouth daily.   Yes Historical Provider, MD  metoprolol tartrate (LOPRESSOR) 25 MG tablet Take 1 tablet (25 mg total) by mouth 2 (two) times daily. 07/26/15  Yes  Thermon Leyland, NP  cetirizine (ZYRTEC) 10 MG tablet Take 1 tablet (10 mg total) by mouth daily. 07/26/15   Thermon Leyland, NP  diclofenac (VOLTAREN) 75 MG EC tablet Take 1 tablet (75 mg total) by mouth 2 (two) times daily. 07/26/15   Thermon Leyland, NP  hydrOXYzine (ATARAX/VISTARIL) 25 MG tablet Take 1 tablet (25 mg total) by mouth every 6 (six) hours as needed for anxiety. 07/26/15   Thermon Leyland, NP  levothyroxine (SYNTHROID, LEVOTHROID) 125 MCG tablet TAKE ONE TABLET BY MOUTH DAILY BEFORE  BREAKFAST. 07/26/15   Thermon Leyland, NP   BP 151/98 mmHg  Pulse 83  Temp(Src) 98 F (36.7 C) (Oral)  Resp 18  Ht  (1.651 m)  Wt 177 lb 8 oz (80.513 kg)  BMI 29.54 kg/m2  SpO2 99%  LMP 01/26/2000   Physical Exam  Constitutional: She is oriented to person, place, and time. She appears well-developed and well-nourished. No distress.  Very pleasant, NAD  HENT:  Head: Normocephalic and atraumatic.  Mouth/Throat: Oropharynx is clear and moist.  Eyes: Conjunctivae and EOM are normal. Pupils are equal, round, and reactive to light.  Neck: Normal range of motion and full passive range of motion without pain. Neck supple. No rigidity.  No rigidity, no meningismus  Cardiovascular: Normal rate, regular rhythm and normal heart sounds.   Pulmonary/Chest: Effort normal and breath sounds normal. No respiratory distress. She has no wheezes.  Abdominal: Soft. Bowel sounds are normal. There is no tenderness. There is no guarding.  Musculoskeletal: Normal range of motion. She exhibits no edema.  Neurological: She is alert and oriented to person, place, and time.  AAOx3, answering questions appropriately; equal strength UE and LE bilaterally; CN grossly intact; moves all extremities appropriately without ataxia; no focal neuro deficits or facial asymmetry appreciated  Skin: Skin is warm and dry. She is not diaphoretic.  Psychiatric: She has a normal mood and affect.  Nursing note and vitals reviewed.   ED Course   Procedures (including critical care time) Labs Review Labs Reviewed - No data to display  Imaging Review No results found. I have personally reviewed and evaluated these images and lab results as part of my medical decision-making.   EKG Interpretation None      MDM   Final diagnoses:  Headache, unspecified headache type  Medication refill   49 year old female here with mild, dull headache for the past 2 days. Patient is currently undergoing rehabilitation at [facility], BP has apparently been elevating during the day, was 170/110 earlier.  Patient denies any associated neurologic symptoms. Her vital signs are stable here. Her neurologic exam is nonfocal. She has no clinical signs of meningitis. Patient does have history of brain aneurysms, however per her report these are small and currently undergoing surveillance at [facility]. Given patient's reassuring vital signs and  normal neurologic exam, I do not suspect acute rupture of aneurysm, SAH, or other intracranial hemorrhage.  Patient given dose of IM Toradol with significant improvement of her headache. Her blood pressure is stable and patient remains neurologically intact. Patient appears stable for discharge. I have refilled her Synthroid to be restarted tomorrow morning at her original dose. Patient will follow-up with her PCP.  Discussed plan with patient, he/she acknowledged understanding and agreed with plan of care.  Return precautions given for new or worsening symptoms.  Garlon HatchetLisa M Arland Usery, PA-C 08/06/15 1906  Lyndal Pulleyaniel Knott, MD 08/07/15 1249

## 2015-08-16 ENCOUNTER — Emergency Department (HOSPITAL_BASED_OUTPATIENT_CLINIC_OR_DEPARTMENT_OTHER): Payer: Self-pay

## 2015-08-16 ENCOUNTER — Encounter (HOSPITAL_BASED_OUTPATIENT_CLINIC_OR_DEPARTMENT_OTHER): Payer: Self-pay | Admitting: Emergency Medicine

## 2015-08-16 ENCOUNTER — Emergency Department (HOSPITAL_BASED_OUTPATIENT_CLINIC_OR_DEPARTMENT_OTHER)
Admission: EM | Admit: 2015-08-16 | Discharge: 2015-08-16 | Disposition: A | Discharge status: Home / Self Care | Payer: Self-pay | Attending: Emergency Medicine | Admitting: Emergency Medicine

## 2015-08-16 DIAGNOSIS — R51 Headache: Secondary | ICD-10-CM | POA: Insufficient documentation

## 2015-08-16 DIAGNOSIS — Z791 Long term (current) use of non-steroidal anti-inflammatories (NSAID): Secondary | ICD-10-CM | POA: Insufficient documentation

## 2015-08-16 DIAGNOSIS — E890 Postprocedural hypothyroidism: Secondary | ICD-10-CM | POA: Insufficient documentation

## 2015-08-16 DIAGNOSIS — F17210 Nicotine dependence, cigarettes, uncomplicated: Secondary | ICD-10-CM | POA: Insufficient documentation

## 2015-08-16 DIAGNOSIS — Z79899 Other long term (current) drug therapy: Secondary | ICD-10-CM | POA: Insufficient documentation

## 2015-08-16 DIAGNOSIS — A5901 Trichomonal vulvovaginitis: Secondary | ICD-10-CM | POA: Insufficient documentation

## 2015-08-16 DIAGNOSIS — E0500 Thyrotoxicosis with diffuse goiter without thyrotoxic crisis or storm: Secondary | ICD-10-CM | POA: Insufficient documentation

## 2015-08-16 DIAGNOSIS — M7989 Other specified soft tissue disorders: Secondary | ICD-10-CM | POA: Insufficient documentation

## 2015-08-16 DIAGNOSIS — A599 Trichomoniasis, unspecified: Secondary | ICD-10-CM

## 2015-08-16 DIAGNOSIS — I10 Essential (primary) hypertension: Secondary | ICD-10-CM | POA: Insufficient documentation

## 2015-08-16 LAB — CBC WITH DIFFERENTIAL/PLATELET
Basophils Absolute: 0 10*3/uL (ref 0.0–0.1)
Basophils Relative: 0 %
EOS PCT: 3 %
Eosinophils Absolute: 0.2 10*3/uL (ref 0.0–0.7)
HEMATOCRIT: 39.2 % (ref 36.0–46.0)
Hemoglobin: 12.6 g/dL (ref 12.0–15.0)
LYMPHS ABS: 2.2 10*3/uL (ref 0.7–4.0)
Lymphocytes Relative: 36 %
MCH: 30.1 pg (ref 26.0–34.0)
MCHC: 32.1 g/dL (ref 30.0–36.0)
MCV: 93.8 fL (ref 78.0–100.0)
MONO ABS: 0.4 10*3/uL (ref 0.1–1.0)
MONOS PCT: 6 %
NEUTROS ABS: 3.4 10*3/uL (ref 1.7–7.7)
Neutrophils Relative %: 55 %
PLATELETS: 243 10*3/uL (ref 150–400)
RBC: 4.18 MIL/uL (ref 3.87–5.11)
RDW: 13 % (ref 11.5–15.5)
WBC: 6.2 10*3/uL (ref 4.0–10.5)

## 2015-08-16 LAB — BASIC METABOLIC PANEL
ANION GAP: 6 (ref 5–15)
BUN: 16 mg/dL (ref 6–20)
CALCIUM: 9.3 mg/dL (ref 8.9–10.3)
CO2: 31 mmol/L (ref 22–32)
Chloride: 102 mmol/L (ref 101–111)
Creatinine, Ser: 0.74 mg/dL (ref 0.44–1.00)
GFR calc Af Amer: >60 mL/min (ref >60)
GLUCOSE: 92 mg/dL (ref 65–99)
Potassium: 3.5 mmol/L (ref 3.5–5.1)
Sodium: 139 mmol/L (ref 135–145)

## 2015-08-16 LAB — WET PREP, GENITAL
Sperm: NONE SEEN
Yeast Wet Prep HPF POC: NONE SEEN

## 2015-08-16 LAB — URINE MICROSCOPIC-ADD ON

## 2015-08-16 LAB — URINALYSIS, ROUTINE W REFLEX MICROSCOPIC
Bilirubin Urine: NEGATIVE
GLUCOSE, UA: NEGATIVE mg/dL
KETONES UR: NEGATIVE mg/dL
Nitrite: NEGATIVE
PH: 6.5 (ref 5.0–8.0)
PROTEIN: NEGATIVE mg/dL
Specific Gravity, Urine: 1.013 (ref 1.005–1.030)

## 2015-08-16 MED ORDER — CEFTRIAXONE SODIUM 250 MG IJ SOLR
250.0000 mg | Freq: Once | INTRAMUSCULAR | Status: AC
Start: 2015-08-16 — End: 2015-08-16
  Administered 2015-08-16: 250 mg via INTRAMUSCULAR
  Filled 2015-08-16: qty 250

## 2015-08-16 MED ORDER — AZITHROMYCIN 250 MG PO TABS
1000.0000 mg | ORAL_TABLET | Freq: Once | ORAL | Status: AC
Start: 2015-08-16 — End: 2015-08-16
  Administered 2015-08-16: 1000 mg via ORAL
  Filled 2015-08-16: qty 4

## 2015-08-16 MED ORDER — KETOROLAC TROMETHAMINE 30 MG/ML IJ SOLN
30.0000 mg | Freq: Once | INTRAMUSCULAR | Status: AC
  Administered 2015-08-16: 30 mg via INTRAVENOUS
  Filled 2015-08-16: qty 1

## 2015-08-16 MED ORDER — METRONIDAZOLE 500 MG PO TABS: 2000.0000 mg | ORAL_TABLET | Freq: Once | ORAL | Status: DC

## 2015-08-16 MED ORDER — LIDOCAINE HCL (PF) 1 % IJ SOLN
INTRAMUSCULAR | Status: AC
  Administered 2015-08-16: 5 mL
  Filled 2015-08-16: qty 5

## 2015-08-16 NOTE — ED Notes (Signed)
Called [facility] to make aware that patient is ready for pickup.

## 2015-08-16 NOTE — Discharge Instructions (Signed)
Hypertension Hypertension, commonly called high blood pressure, is when the force of blood pumping through your arteries is too strong. Your arteries are the blood vessels that carry blood from your heart throughout your body. A blood pressure reading consists of a higher number over a lower number, such as 110/72. The higher number (systolic) is the pressure inside your arteries when your heart pumps. The lower number (diastolic) is the pressure inside your arteries when your heart relaxes. Ideally you want your blood pressure below 120/80. Hypertension forces your heart to work harder to pump blood. Your arteries may become narrow or stiff. Having untreated or uncontrolled hypertension can cause heart attack, stroke, kidney disease, and other problems. RISK FACTORS Some risk factors for high blood pressure are controllable. Others are not.  Risk factors you cannot control include:   Race. You may be at higher risk if you are African American.  Age. Risk increases with age.  Gender. Men are at higher risk than women before age 45 years. After age 65, women are at higher risk than men. Risk factors you can control include:  Not getting enough exercise or physical activity.  Being overweight.  Getting too much fat, sugar, calories, or salt in your diet.  Drinking too much alcohol. SIGNS AND SYMPTOMS Hypertension does not usually cause signs or symptoms. Extremely high blood pressure (hypertensive crisis) may cause headache, anxiety, shortness of breath, and nosebleed. DIAGNOSIS To check if you have hypertension, your health care provider will measure your blood pressure while you are seated, with your arm held at the level of your heart. It should be measured at least twice using the same arm. Certain conditions can cause a difference in blood pressure between your right and left arms. A blood pressure reading that is higher than normal on one occasion does not mean that you need treatment. If  it is not clear whether you have high blood pressure, you may be asked to return on a different day to have your blood pressure checked again. Or, you may be asked to monitor your blood pressure at home for 1 or more weeks. TREATMENT Treating high blood pressure includes making lifestyle changes and possibly taking medicine. Living a healthy lifestyle can help lower high blood pressure. You may need to change some of your habits. Lifestyle changes may include:  Following the DASH diet. This diet is high in fruits, vegetables, and whole grains. It is low in salt, red meat, and added sugars.  Keep your sodium intake below 2,300 mg per day.  Getting at least 30-45 minutes of aerobic exercise at least 4 times per week.  Losing weight if necessary.  Not smoking.  Limiting alcoholic beverages.  Learning ways to reduce stress. Your health care provider may prescribe medicine if lifestyle changes are not enough to get your blood pressure under control, and if one of the following is true:  You are 18-59 years of age and your systolic blood pressure is above 140.  You are 60 years of age or older, and your systolic blood pressure is above 150.  Your diastolic blood pressure is above 90.  You have diabetes, and your systolic blood pressure is over 140 or your diastolic blood pressure is over 90.  You have kidney disease and your blood pressure is above 140/90.  You have heart disease and your blood pressure is above 140/90. Your personal target blood pressure may vary depending on your medical conditions, your age, and other factors. HOME CARE INSTRUCTIONS    Have your blood pressure rechecked as directed by your health care provider.   Take medicines only as directed by your health care provider. Follow the directions carefully. Blood pressure medicines must be taken as prescribed. The medicine does not work as well when you skip doses. Skipping doses also puts you at risk for  problems.  Do not smoke.   Monitor your blood pressure at home as directed by your health care provider. SEEK MEDICAL CARE IF:   You think you are having a reaction to medicines taken.  You have recurrent headaches or feel dizzy.  You have swelling in your ankles.  You have trouble with your vision. SEEK IMMEDIATE MEDICAL CARE IF:  You develop a severe headache or confusion.  You have unusual weakness, numbness, or feel faint.  You have severe chest or abdominal pain.  You vomit repeatedly.  You have trouble breathing. MAKE SURE YOU:   Understand these instructions.  Will watch your condition.  Will get help right away if you are not doing well or get worse.   This information is not intended to replace advice given to you by your health care provider. Make sure you discuss any questions you have with your health care provider.   Document Released: 09/06/2005 Document Revised: 01/21/2015 Document Reviewed: 06/29/2013 Elsevier Interactive Patient Education 2016 [employer]. Trichomoniasis Trichomoniasis is an infection caused by an organism called Trichomonas. The infection can affect both women and men. In women, the outer female genitalia and the vagina are affected. In men, the penis is mainly affected, but the prostate and other reproductive organs can also be involved. Trichomoniasis is a sexually transmitted infection (STI) and is most often passed to another person through sexual contact.  RISK FACTORS  Having unprotected sexual intercourse.  Having sexual intercourse with an infected partner. SIGNS AND SYMPTOMS  Symptoms of trichomoniasis in women include:  Abnormal gray-green frothy vaginal discharge.  Itching and irritation of the vagina.  Itching and irritation of the area outside the vagina. Symptoms of trichomoniasis in men include:   Penile discharge with or without pain.  Pain during urination. This results from inflammation of the  urethra. DIAGNOSIS  Trichomoniasis may be found during a Pap test or physical exam. Your health care provider may use one of the following methods to help diagnose this infection:  Testing the pH of the vagina with a test tape.  Using a vaginal swab test that checks for the Trichomonas organism. A test is available that provides results within a few minutes.  Examining a urine sample.  Testing vaginal secretions. Your health care provider may test you for other STIs, including HIV. TREATMENT   You may be given medicine to fight the infection. Women should inform their health care provider if they could be or are pregnant. Some medicines used to treat the infection should not be taken during pregnancy.  Your health care provider may recommend over-the-counter medicines or creams to decrease itching or irritation.  Your sexual partner will need to be treated if infected.  Your health care provider may test you for infection again 3 months after treatment. HOME CARE INSTRUCTIONS   Take medicines only as directed by your health care provider.  Take over-the-counter medicine for itching or irritation as directed by your health care provider.  Do not have sexual intercourse while you have the infection.  Women should not douche or wear tampons while they have the infection.  Discuss your infection with your partner. Your partner may  have gotten the infection from you, or you may have gotten it from your partner.  Have your sex partner get examined and treated if necessary.  Practice safe, informed, and protected sex.  See your health care provider for other STI testing. SEEK MEDICAL CARE IF:   You still have symptoms after you finish your medicine.  You develop abdominal pain.  You have pain when you urinate.  You have bleeding after sexual intercourse.  You develop a rash.  Your medicine makes you sick or makes you throw up (vomit). MAKE SURE YOU:  Understand these  instructions.  Will watch your condition.  Will get help right away if you are not doing well or get worse.   This information is not intended to replace advice given to you by your health care provider. Make sure you discuss any questions you have with your health care provider.   Document Released: 03/02/2001 Document Revised: 09/27/2014 Document Reviewed: 06/18/2013 Elsevier Interactive Patient Education [employer]2016 Elsevier Inc.

## 2015-08-16 NOTE — ED Notes (Signed)
Pt states she has been having high blood pressure associated with swelling in ankles and numbness in bilateral hands

## 2015-08-16 NOTE — ED Provider Notes (Signed)
CSN: [id number]     Arrival date & time 08/16/15  1722 History  By signing my name below, I, Amy [name], attest that this documentation has been prepared under the direction and in the presence of Amy MoMatthew Crosley Stejskal, MD. Electronically Signed: Budd PalmerVanessa [name], ED Scribe. 08/16/2015. 5:57 PM.     Chief Complaint  Patient presents with  . Hypertension   Patient is a 49 y.o. female presenting with hypertension. The history is provided by the patient. No language interpreter was used.  Hypertension This is a recurrent problem. The current episode started more than 1 week ago. The problem occurs constantly. The problem has not changed since onset.Associated symptoms include headaches. Pertinent negatives include no abdominal pain. Nothing relieves the symptoms.   HPI Comments: Amy [name] is a 49 y.o. female smoker at 0.2 ppd with a PMHx of HTN who presents to the Emergency Department complaining of worsening HTN onset 1 week ago. She states her current values have been in the 170's/100's. She reports associated HA, ankle swelling, bilateral tingling and numbness to the hands (onset a few months ago), as well as numbness and tingling to the shoulders and arms (onset earlier today). She notes her blood pressure medication has recently been increased in dosage by her PCP, but notes that this has not reduced her BP. She states [facility], which she last used 22 days ago. She reports a PMHx of 2 aneurisms.   She also c/o lower back pain and states she has recently been spotting onset 1 week ago, worsening a few days ago. She states she [facility], which came back cloudy, but without blood. She reports a PSHx of hysterectomy. She notes she only has one ovary. She also states she has some flea bites on her bilateral lower legs. Pt denies abdominal pain, fever, and vaginal discharge.   Past Medical History  Diagnosis Date  . Depression   .  Grave's disease 2007    TSH (08/31/2010) = 0.186 (low), free T4 = 0.91 (WNL)  . Anxiety   . Hypertension   . Postsurgical hypothyroidism   . Raynaud disease 2007  . Brain aneurysm    Past Surgical History  Procedure Laterality Date  . Colonoscopy  02/14/2008    done by Dr. Christella HartiganJacobs - small colonic polyp identified, 6 mm in size and per biopsy --> tubular adenoma with no malignancy or high grade dysplasia noted  . Abdominal hysterectomy    . Thyroide[facility]  03/08/2012    [facility]; history of Graves' disease with multinodular goiter  . Wisdom tooth extraction     Family History  Problem Relation Age of Onset  . Colon cancer Maternal Grandfather   . Cancer Maternal Grandfather     prostate  . Hypertension Mother   . Thyroid disease Mother   . Lupus Mother   . Hypertension Father    Social History  Substance Use Topics  . Smoking status: Current Every Day Smoker -- 0.20 packs/day for 35 years    Types: Cigarettes    Last Attempt to Quit: 10/13/1996  . Smokeless tobacco: None  . Alcohol Use: No   OB History    Gravida Para Term Preterm AB TAB SAB Ectopic Multiple Living   3 3 2 1  0  0   2     Review of Systems  Constitutional: Negative for fever.  Cardiovascular: Positive for leg swelling.  Gastrointestinal: Negative for abdominal pain.  Genitourinary: Positive for vaginal bleeding. Negative for vaginal discharge.  Neurological: Positive for numbness and headaches.  All other systems reviewed and are negative.   Allergies  Adhesive  Home Medications   Prior to Admission medications   Medication Sig Start Date End Date Taking? Authorizing Provider  amitriptyline (ELAVIL) 100 MG tablet Take 1 tablet (100 mg total) by mouth at bedtime. 07/26/15  Yes Thermon Leyland, NP  cetirizine (ZYRTEC) 10 MG tablet Take 1 tablet (10 mg total) by mouth daily. 07/26/15  Yes Thermon Leyland, NP  diclofenac (VOLTAREN) 50 MG EC tablet Take 50 mg by mouth 2 (two) times daily.   Yes  Historical Provider, MD  diclofenac (VOLTAREN) 75 MG EC tablet Take 1 tablet (75 mg total) by mouth 2 (two) times daily. 07/26/15  Yes Thermon Leyland, NP  gabapentin (NEURONTIN) 300 MG capsule Take 1 capsule (300 mg total) by mouth 2 (two) times daily. 07/26/15  Yes Thermon Leyland, NP  hydrochlorothiazide (HYDRODIURIL) 12.5 MG tablet Take 12.5 mg by mouth daily.   Yes Historical Provider, MD  hydrOXYzine (ATARAX/VISTARIL) 25 MG tablet Take 1 tablet (25 mg total) by mouth every 6 (six) hours as needed for anxiety. 07/26/15  Yes Thermon Leyland, NP  levothyroxine (SYNTHROID, LEVOTHROID) 125 MCG tablet Take 1 tablet (125 mcg total) by mouth daily before breakfast. 08/06/15  Yes Garlon Hatchet, PA-C  metoprolol tartrate (LOPRESSOR) 25 MG tablet Take 1 tablet (25 mg total) by mouth 2 (two) times daily. 07/26/15  Yes Thermon Leyland, NP  metroNIDAZOLE (FLAGYL) 500 MG tablet Take 4 tablets (2,000 mg total) by mouth once. 08/16/15   Amy Mo, MD   BP 136/85 mmHg  Pulse 77  Temp(Src) 98.1 F (36.7 C) (Oral)  Resp 16  Ht  (1.626 m)  SpO2 100%  LMP 01/26/2000 Physical Exam  Constitutional: She is oriented to person, place, and time. She appears well-developed and well-nourished.  HENT:  Head: Normocephalic and atraumatic.  Right Ear: External ear normal.  Left Ear: External ear normal.  Eyes: Conjunctivae and EOM are normal. Pupils are equal, round, and reactive to light.  Neck: Normal range of motion. Neck supple.  Cardiovascular: Normal rate, regular rhythm, normal heart sounds and intact distal pulses.   Pulmonary/Chest: Effort normal and breath sounds normal.  Abdominal: Soft. Bowel sounds are normal. There is no tenderness.  Genitourinary: Right adnexum displays no tenderness. Left adnexum displays no tenderness. Vaginal discharge (white) found.  No cervix  Musculoskeletal: Normal range of motion.  Neurological: She is alert and oriented to person, place, and time. She has normal strength.  No cranial nerve deficit or sensory deficit. GCS eye subscore is 4. GCS verbal subscore is 5. GCS motor subscore is 6.  Skin: Skin is warm and dry.  Vitals reviewed.   ED Course  Procedures  DIAGNOSTIC STUDIES: Oxygen Saturation is 100% on RA, normal by my interpretation.    COORDINATION OF CARE: 5:52 PM - Discussed plans to perform a pelvic exam and to order a head CT. Pt advised of plan for treatment and pt agrees.  Labs Review Labs Reviewed  WET PREP, GENITAL - Abnormal; Notable for the following:    Trich, Wet Prep PRESENT (*)    Clue Cells Wet Prep HPF POC PRESENT (*)    WBC, Wet Prep HPF POC MODERATE (*)    All other components within normal limits  URINALYSIS, ROUTINE W REFLEX MICROSCOPIC (NOT AT [facility]) - Abnormal; Notable for the  following:    Hgb urine dipstick SMALL (*)    Leukocytes, UA SMALL (*)    All other components within normal limits  URINE MICROSCOPIC-ADD ON - Abnormal; Notable for the following:    Squamous Epithelial / LPF 0-5 (*)    Bacteria, UA FEW (*)    All other components within normal limits  CBC WITH DIFFERENTIAL/PLATELET  BASIC METABOLIC PANEL  GC/CHLAMYDIA PROBE AMP ([place]) NOT AT [facility]    Imaging Review Ct Head Wo Contrast  08/16/2015  CLINICAL DATA:  Headache, hypertension EXAM: CT HEAD WITHOUT CONTRAST TECHNIQUE: Contiguous axial images were obtained from the base of the skull through the vertex without intravenous contrast. COMPARISON:  09/03/2014 FINDINGS: No skull fracture is noted. Paranasal sinuses and mastoid air cells are unremarkable. No intracranial hemorrhage, mass effect or midline shift. No acute cortical infarction. No mass lesion is noted on this unenhanced scan. The gray and white-matter differentiation is preserved. No hydrocephalus. IMPRESSION: No acute intracranial abnormality. Electronically Signed   By: Natasha Mead M.D.   On: 08/16/2015 18:54   I have personally reviewed and evaluated these images and lab results as part  of my medical decision-making.   EKG Interpretation None      MDM   Final diagnoses:  Essential hypertension  Trichomoniasis    49 y.o. female with pertinent PMH of cocaine abuse in rehab presents with multiple complaints, primarily with concern for HTN, but also with HA, abd pain, dysuria, and vaginal bleeding.  HA chronic, recurrent.  No other concerning historical elements.  Abd pain primarily present after urinating.  Not present at rest.  Exam as above.  Dorna Bloom demonstrated no acute intracranial pathology and ha relieved with toradol.  GU with trich.  Empirically treated with rocephin/azithro in case of urethral contamination, and will dc home with flagyl.  DC home in stable condition.    I have reviewed all laboratory and imaging studies if ordered as above  1. Essential hypertension   2. Trichomoniasis           Amy Mo, MD 08/16/15 2037

## 2015-08-18 LAB — GC/CHLAMYDIA PROBE AMP (~~LOC~~) NOT AT ARMC
Chlamydia: NEGATIVE
NEISSERIA GONORRHEA: NEGATIVE

## 2015-09-04 ENCOUNTER — Ambulatory Visit
Admission: RE | Admit: 2015-09-04 | Discharge: 2015-09-04 | Disposition: A | Discharge status: Home / Self Care | Payer: No Typology Code available for payment source | Source: Ambulatory Visit | Attending: Nurse Practitioner | Admitting: Nurse Practitioner

## 2015-09-04 DIAGNOSIS — N644 Mastodynia: Secondary | ICD-10-CM

## 2015-11-05 ENCOUNTER — Emergency Department (INDEPENDENT_AMBULATORY_CARE_PROVIDER_SITE_OTHER)
Admission: EM | Admit: 2015-11-05 | Discharge: 2015-11-05 | Disposition: A | Discharge status: Home / Self Care | Payer: No Typology Code available for payment source | Source: Home / Self Care | Attending: Family Medicine | Admitting: Family Medicine

## 2015-11-05 ENCOUNTER — Encounter (HOSPITAL_COMMUNITY): Payer: Self-pay | Admitting: Emergency Medicine

## 2015-11-05 DIAGNOSIS — W57XXXA Bitten or stung by nonvenomous insect and other nonvenomous arthropods, initial encounter: Secondary | ICD-10-CM

## 2015-11-05 DIAGNOSIS — S80862A Insect bite (nonvenomous), left lower leg, initial encounter: Secondary | ICD-10-CM

## 2015-11-05 MED ORDER — METHYLPREDNISOLONE SODIUM SUCC 125 MG IJ SOLR
125.0000 mg | Freq: Once | INTRAMUSCULAR | Status: AC
  Administered 2015-11-05: 125 mg via INTRAMUSCULAR

## 2015-11-05 MED ORDER — PREDNISONE 10 MG PO TABS: ORAL_TABLET | ORAL | Status: DC

## 2015-11-05 MED ORDER — METHYLPREDNISOLONE SODIUM SUCC 125 MG IJ SOLR
INTRAMUSCULAR | Status: AC
  Filled 2015-11-05: qty 2

## 2015-11-05 NOTE — ED Provider Notes (Signed)
CSN: [id number]     Arrival date & time 11/05/15  1303 History   First MD Initiated Contact with Patient 11/05/15 1325     Chief Complaint  Patient presents with  . Insect Bite    fleas   (Consider location/radiation/quality/duration/timing/severity/associated sxs/prior Treatment) HPI Pt presents with flea bites. States this is the 2nd episode. Has been using benadryl with some relief. Itching, causing scratch injuries to her skin, mostly lower legs. No pain.  Past Medical History  Diagnosis Date  . Depression   . Grave's disease 2007    TSH (08/31/2010) = 0.186 (low), free T4 = 0.91 (WNL)  . Anxiety   . Hypertension   . Postsurgical hypothyroidism   . Raynaud disease 2007  . Brain aneurysm    Past Surgical History  Procedure Laterality Date  . Colonoscopy  02/14/2008    done by Dr. Christella Hartigan - small colonic polyp identified, 6 mm in size and per biopsy --> tubular adenoma with no malignancy or high grade dysplasia noted  . Abdominal hysterectomy    . Thyroidectomy  03/08/2012    [facility]; history of Graves' disease with multinodular goiter  . Wisdom tooth extraction     Family History  Problem Relation Age of Onset  . Colon cancer Maternal Grandfather   . Cancer Maternal Grandfather     prostate  . Hypertension Mother   . Thyroid disease Mother   . Lupus Mother   . Hypertension Father    Social History  Substance Use Topics  . Smoking status: Current Every Day Smoker -- 0.20 packs/day for 35 years    Types: Cigarettes    Last Attempt to Quit: 10/13/1996  . Smokeless tobacco: None  . Alcohol Use: No   OB History    Gravida Para Term Preterm AB TAB SAB Ectopic Multiple Living   0  0   2     Review of Systems Flea bites both lower legs Allergies  Adhesive  Home Medications   Prior to Admission medications   Medication Sig Start Date End Date Taking? Authorizing Provider  amitriptyline (ELAVIL) 100 MG tablet Take 1 tablet (100 mg total) by  mouth at bedtime. 07/26/15  Yes Thermon Leyland, NP  cetirizine (ZYRTEC) 10 MG tablet Take 1 tablet (10 mg total) by mouth daily. 07/26/15  Yes Thermon Leyland, NP  diclofenac (VOLTAREN) 50 MG EC tablet Take 50 mg by mouth 2 (two) times daily.   Yes Historical Provider, MD  diclofenac (VOLTAREN) 75 MG EC tablet Take 1 tablet (75 mg total) by mouth 2 (two) times daily. 07/26/15  Yes Thermon Leyland, NP  gabapentin (NEURONTIN) 300 MG capsule Take 1 capsule (300 mg total) by mouth 2 (two) times daily. 07/26/15  Yes Thermon Leyland, NP  hydrochlorothiazide (HYDRODIURIL) 12.5 MG tablet Take 12.5 mg by mouth daily.   Yes Historical Provider, MD  hydrOXYzine (ATARAX/VISTARIL) 25 MG tablet Take 1 tablet (25 mg total) by mouth every 6 (six) hours as needed for anxiety. 07/26/15  Yes Thermon Leyland, NP  levothyroxine (SYNTHROID, LEVOTHROID) 125 MCG tablet Take 1 tablet (125 mcg total) by mouth daily before breakfast. 08/06/15  Yes Garlon Hatchet, PA-C  metoprolol tartrate (LOPRESSOR) 25 MG tablet Take 1 tablet (25 mg total) by mouth 2 (two) times daily. 07/26/15  Yes Thermon Leyland, NP  metroNIDAZOLE (FLAGYL) 500 MG tablet Take 4 tablets (2,000 mg total) by mouth once. 08/16/15   Mirian Mo, MD  Meds Ordered and Administered this Visit   Medications  methylPREDNISolone sodium succinate (SOLU-MEDROL) 125 mg/2 mL injection 125 mg (not administered)    BP 135/88 mmHg  Pulse 93  Temp(Src) 98.1 F (36.7 C) (Oral)  Resp 16  SpO2 100%  LMP 01/26/2000 No data found.   Physical Exam  Constitutional: She appears well-developed and well-nourished.  Pulmonary/Chest: Effort normal.  Musculoskeletal: Normal range of motion.  Neurological: She is alert.  Skin: Skin is warm and dry. Rash noted. Rash is maculopapular.       ED Course  Procedures (including critical care time)  Labs Review Labs Reviewed - No data to display  Imaging Review No results found.   Visual Acuity Review  Right Eye Distance:     Left Eye Distance:   Bilateral Distance:    Right Eye Near:   Left Eye Near:    Bilateral Near:         MDM   1. Flea bite of multiple sites of left lower extremity, initial encounter    Patient is advised to continue home symptomatic treatment. Prescription for prednisone  sent pharmacy patient has indicated. Patient is advised that if there are new or worsening symptoms or attend the emergency department, or contact primary care provider. Instructions of care provided discharged home in stable condition. Return to work/school note provided.  THIS NOTE WAS GENERATED USING A VOICE RECOGNITION SOFTWARE PROGRAM. ALL REASONABLE EFFORTS  WERE MADE TO PROOFREAD THIS DOCUMENT FOR ACCURACY.     Tharon Aquas, PA 11/05/15 1415

## 2015-11-05 NOTE — ED Notes (Signed)
Pt states she has had fleas for over a week.  Pt presents with bites all over her lower legs.  She states she has already sprayed her home.

## 2015-11-05 NOTE — Discharge Instructions (Signed)
Insect Bite °Mosquitoes, flies, fleas, bedbugs, and other insects can bite. Insect bites are different from insect stings. The bite may be red, puffy (swollen), and itchy for 2 to 4 days. Most bites get better on their own. °HOME CARE  °· Do not scratch the bite. °· Keep the bite clean and dry. Wash the bite with soap and water every day, as told by your doctor. °· If directed, apply ice to the bite area. °¨ Put ice in a plastic bag. °¨ Place a towel between your skin and the bag. °¨ Leave the ice on for 20 minutes, 2-3 times per day. °· Follow instructions from your doctor about using medicated lotions or creams. These can help with itching. °· Apply or take over-the-counter and prescription medicines only as told by your doctor. °· If you were given an antibiotic medicine, use it as told by your doctor. Do not stop using the medicine even if your condition improves. °· Keep all follow-up visits as told by your doctor. This is important. °GET HELP IF: °· You have redness, swelling (inflammation), or pain near your bite that is getting worse. °· You have a fever. °GET HELP RIGHT AWAY IF:  °· You have joint pain.   °· You have fluid, blood, or pus coming from the bite area.   °· You have a headache. °· You have neck pain. °· You feel weaker than you normally do.   °· You have a rash.   °· You have chest pain. °· You have shortness of breath. °· You have stomach pain, feel sick to your stomach (nauseous), or throw up (vomit). °· You feel more tired or sleepy than you normally do. °  °This information is not intended to replace advice given to you by your health care provider. Make sure you discuss any questions you have with your health care provider. °  °Document Released: 09/03/2000 Document Revised: 05/28/2015 Document Reviewed: 01/22/2015 °Elsevier Interactive Patient Education ©2016 [employer] Inc. ° °

## 2016-02-14 ENCOUNTER — Emergency Department (HOSPITAL_COMMUNITY)
Admission: EM | Admit: 2016-02-14 | Discharge: 2016-02-14 | Disposition: A | Discharge status: Home / Self Care | Payer: No Typology Code available for payment source | Attending: Emergency Medicine | Admitting: Emergency Medicine

## 2016-02-14 ENCOUNTER — Encounter (HOSPITAL_COMMUNITY): Payer: Self-pay | Admitting: Emergency Medicine

## 2016-02-14 ENCOUNTER — Emergency Department (HOSPITAL_COMMUNITY): Payer: No Typology Code available for payment source

## 2016-02-14 DIAGNOSIS — R079 Chest pain, unspecified: Secondary | ICD-10-CM

## 2016-02-14 DIAGNOSIS — F17210 Nicotine dependence, cigarettes, uncomplicated: Secondary | ICD-10-CM | POA: Insufficient documentation

## 2016-02-14 DIAGNOSIS — Z79899 Other long term (current) drug therapy: Secondary | ICD-10-CM | POA: Insufficient documentation

## 2016-02-14 DIAGNOSIS — R059 Cough, unspecified: Secondary | ICD-10-CM

## 2016-02-14 DIAGNOSIS — R0789 Other chest pain: Secondary | ICD-10-CM | POA: Insufficient documentation

## 2016-02-14 DIAGNOSIS — R05 Cough: Secondary | ICD-10-CM | POA: Insufficient documentation

## 2016-02-14 DIAGNOSIS — I10 Essential (primary) hypertension: Secondary | ICD-10-CM | POA: Insufficient documentation

## 2016-02-14 DIAGNOSIS — F329 Major depressive disorder, single episode, unspecified: Secondary | ICD-10-CM | POA: Insufficient documentation

## 2016-02-14 LAB — CBC WITH DIFFERENTIAL/PLATELET
BASOS ABS: 0 10*3/uL (ref 0.0–0.1)
Basophils Relative: 0 %
Eosinophils Absolute: 0.3 10*3/uL (ref 0.0–0.7)
Eosinophils Relative: 3 %
HEMATOCRIT: 39 % (ref 36.0–46.0)
HEMOGLOBIN: 12.6 g/dL (ref 12.0–15.0)
LYMPHS PCT: 36 %
Lymphs Abs: 2.9 10*3/uL (ref 0.7–4.0)
MCH: 29.2 pg (ref 26.0–34.0)
MCHC: 32.3 g/dL (ref 30.0–36.0)
MCV: 90.5 fL (ref 78.0–100.0)
Monocytes Absolute: 0.6 10*3/uL (ref 0.1–1.0)
Monocytes Relative: 7 %
NEUTROS PCT: 54 %
Neutro Abs: 4.2 10*3/uL (ref 1.7–7.7)
PLATELETS: 232 10*3/uL (ref 150–400)
RBC: 4.31 MIL/uL (ref 3.87–5.11)
RDW: 12.6 % (ref 11.5–15.5)
WBC: 8 10*3/uL (ref 4.0–10.5)

## 2016-02-14 LAB — BASIC METABOLIC PANEL
ANION GAP: 6 (ref 5–15)
BUN: 14 mg/dL (ref 6–20)
CO2: 30 mmol/L (ref 22–32)
Calcium: 8.9 mg/dL (ref 8.9–10.3)
Chloride: 105 mmol/L (ref 101–111)
Creatinine, Ser: 0.65 mg/dL (ref 0.44–1.00)
GFR calc Af Amer: >60 mL/min (ref >60)
Glucose, Bld: 85 mg/dL (ref 65–99)
POTASSIUM: 3.8 mmol/L (ref 3.5–5.1)
SODIUM: 141 mmol/L (ref 135–145)

## 2016-02-14 LAB — I-STAT TROPONIN, ED: Troponin i, poc: 0 ng/mL (ref 0.00–0.08)

## 2016-02-14 MED ORDER — IBUPROFEN 600 MG PO TABS: 600.0000 mg | ORAL_TABLET | Freq: Four times a day (QID) | ORAL | Status: DC | PRN

## 2016-02-14 MED ORDER — BENZONATATE 100 MG PO CAPS: 200.0000 mg | ORAL_CAPSULE | Freq: Two times a day (BID) | ORAL | Status: DC | PRN

## 2016-02-14 NOTE — ED Provider Notes (Signed)
CSN: [id number]     Arrival date & time 02/14/16  1657 History   First MD Initiated Contact with Patient 02/14/16 1719     Chief Complaint  Patient presents with  . Cough     (Consider location/radiation/quality/duration/timing/severity/associated sxs/prior Treatment) HPI   Patient is a 50 year old female with past medical history of hypertension, hyperlipidemia and depression who presents the ED with complaint of cough, onset 2 weeks. Patient reports having a productive cough (clear/yellow sputum) with associated rhinorrhea, nasal congestion that started 2 weeks ago. She notes over the past week her rhinorrhea and nasal congestion improved. Patient reports over the past week and a half she began having constant diffuse anterior chest pressure radiating around bilaterally to her lateral/posterior chest. Patient reports chest pain is constant and worse with coughing. She also endorses associated shortness of breath that occurs with ambulation. Patient also reports history of chronic lower back pain but notes her pain has worsened over the past week with her coughing. Endorses associated chills and wheezing. Patient notes she has taken TheraFlu and aspirin at home without relief. Denies any recent sick contacts. Denies fever, headache, sore throat, ear pain, hemoptysis, abdominal pain, N/V, urinary sxs, numbness, tingling, saddle anesthesia, loss of bowel or bladder, weakness, IVDU, cancer or recent spinal manipulation. Denies personal or family history of cardiac disease. Denies leg swelling, recent surgery/trauma, recent hospitalization/immobilization, hx of blood clots, recent plane travel, hx of cancer.   Past Medical History  Diagnosis Date  . Depression   . Grave's disease 2007    TSH (08/31/2010) = 0.186 (low), free T4 = 0.91 (WNL)  . Anxiety   . Hypertension   . Postsurgical hypothyroidism   . Raynaud disease 2007  . Brain aneurysm    Past Surgical History  Procedure Laterality Date   . Colonoscopy  02/14/2008    done by Dr. Christella HartiganJacobs - small colonic polyp identified, 6 mm in size and per biopsy --> tubular adenoma with no malignancy or high grade dysplasia noted  . Abdominal hysterectomy    . [facility]  [facility]; history of Graves' disease with multinodular goiter  . Wisdom tooth extraction     Family History  Problem Relation Age of Onset  . Colon cancer Maternal Grandfather   . Cancer Maternal Grandfather     prostate  . Hypertension Mother   . Thyroid disease Mother   . Lupus Mother   . Hypertension Father    Social History  Substance Use Topics  . Smoking status: Current Every Day Smoker -- 0.20 packs/day for 35 years    Types: Cigarettes    Last Attempt to Quit: 10/13/1996  . Smokeless tobacco: None  . Alcohol Use: No   OB History    Gravida Para Term Preterm AB TAB SAB Ectopic Multiple Living   3 3 2 1  0  0   2     Review of Systems  Constitutional: Positive for chills.  HENT: Positive for rhinorrhea.   Respiratory: Positive for cough, shortness of breath and wheezing.   Cardiovascular: Positive for chest pain.  Musculoskeletal: Positive for back pain.  All other systems reviewed and are negative.     Allergies  Adhesive  Home Medications   Prior to Admission medications   Medication Sig Start Date End Date Taking? Authorizing Provider  amitriptyline (ELAVIL) 100 MG tablet Take 1 tablet (100 mg total) by mouth at bedtime. 07/26/15  Yes Thermon LeylandLaura A Davis, NP  cetirizine (ZYRTEC) 10 MG  tablet Take 1 tablet (10 mg total) by mouth daily. 07/26/15  Yes Thermon Leyland, NP  diclofenac (VOLTAREN) 75 MG EC tablet Take 1 tablet (75 mg total) by mouth 2 (two) times daily. 07/26/15  Yes Thermon Leyland, NP  gabapentin (NEURONTIN) 300 MG capsule Take 1 capsule (300 mg total) by mouth 2 (two) times daily. 07/26/15  Yes Thermon Leyland, NP  hydrochlorothiazide (HYDRODIURIL) 12.5 MG tablet Take 12.5 mg by mouth daily.   Yes Historical  Provider, MD  hydrOXYzine (ATARAX/VISTARIL) 25 MG tablet Take 1 tablet (25 mg total) by mouth every 6 (six) hours as needed for anxiety. 07/26/15  Yes Thermon Leyland, NP  levothyroxine (SYNTHROID, LEVOTHROID) 125 MCG tablet Take 1 tablet (125 mcg total) by mouth daily before breakfast. 08/06/15  Yes Garlon Hatchet, PA-C  metoprolol tartrate (LOPRESSOR) 25 MG tablet Take 1 tablet (25 mg total) by mouth 2 (two) times daily. 07/26/15  Yes Thermon Leyland, NP  benzonatate (TESSALON) 100 MG capsule Take 2 capsules (200 mg total) by mouth 2 (two) times daily as needed for cough. 02/14/16   Barrett Henle, PA-C  ibuprofen (ADVIL,MOTRIN) 600 MG tablet Take 1 tablet (600 mg total) by mouth every 6 (six) hours as needed. 02/14/16   Barrett Henle, PA-C  predniSONE (DELTASONE) 10 MG tablet Sig: 4 tables once a day for 3 days, 3 tablets once a day X3 days, 2 tablets a day for 3 days, 1 tablet a day for 3 days. Patient not taking: Reported on 02/14/2016 11/05/15   Tharon Aquas, PA   BP 124/79 mmHg  Pulse 78  Temp(Src) 99.3 F (37.4 C) (Oral)  Resp 18  SpO2 99%  LMP 01/26/2000 Physical Exam  Constitutional: She is oriented to person, place, and time. She appears well-developed and well-nourished. No distress.  HENT:  Head: Normocephalic and atraumatic.  Right Ear: Tympanic membrane normal.  Left Ear: Tympanic membrane normal.  Nose: Right sinus exhibits maxillary sinus tenderness. Right sinus exhibits no frontal sinus tenderness. Left sinus exhibits maxillary sinus tenderness. Left sinus exhibits no frontal sinus tenderness.  Mouth/Throat: Uvula is midline, oropharynx is clear and moist and mucous membranes are normal. No oropharyngeal exudate, posterior oropharyngeal edema, posterior oropharyngeal erythema or tonsillar abscesses.  Eyes: Conjunctivae and EOM are normal. Pupils are equal, round, and reactive to light. Right eye exhibits no discharge. Left eye exhibits no discharge. No scleral  icterus.  Neck: Normal range of motion. Neck supple.  Cardiovascular: Normal rate, regular rhythm, normal heart sounds and intact distal pulses.   Pulmonary/Chest: Effort normal and breath sounds normal. No respiratory distress. She has no wheezes. She has no rales. She exhibits tenderness (diffuse anterior chest wall TTP).  Abdominal: Soft. Bowel sounds are normal. She exhibits no distension and no mass. There is no tenderness. There is no rebound and no guarding.  Musculoskeletal: Normal range of motion. She exhibits no edema.  No midline C, T, or L tenderness. Mild TTP over bilateral lumbar paraspinal muscles. Full range of motion of neck and back. Full range of motion of bilateral upper and lower extremities, with 5/5 strength. Sensation intact. 2+ radial and PT pulses. Cap refill <2 seconds. Patient able to stand and ambulate without assistance.    Lymphadenopathy:    She has no cervical adenopathy.  Neurological: She is alert and oriented to person, place, and time. She has normal strength and normal reflexes. No sensory deficit.  Skin: Skin is warm and dry. She is  not diaphoretic.  Nursing note and vitals reviewed.   ED Course  Procedures (including critical care time) Labs Review Labs Reviewed  BASIC METABOLIC PANEL  CBC WITH DIFFERENTIAL/PLATELET  Rosezena Sensor, ED    Imaging Review Dg Chest 2 View  02/14/2016  CLINICAL DATA:  Chest pain for 2 weeks with shortness of Breath EXAM: CHEST  2 VIEW COMPARISON:  None. FINDINGS: The heart size and mediastinal contours are within normal limits. Both lungs are clear. The visualized skeletal structures are unremarkable. IMPRESSION: No active cardiopulmonary disease. Electronically Signed   By: Alcide Clever M.D.   On: 02/14/2016 17:43   I have personally reviewed and evaluated these images and lab results as part of my medical decision-making.   EKG Interpretation   Date/Time:  Saturday Feb 14 2016 18:09:33 EDT Ventricular Rate:   85 PR Interval:  171 QRS Duration: 72 QT Interval:  376 QTC Calculation: 447 R Axis:   46 Text Interpretation:  Sinus rhythm LAE, consider biatrial enlargement  Baseline wander Since last tracing rate slower Otherwise no significant  change Confirmed by KNOTT MD, Reuel Boom ([id number]) on 02/14/2016 6:13:03 PM      MDM   Final diagnoses:  Cough  Chest pain, unspecified chest pain type    Patient presents with upper respiratory symptoms, cough, shortness of breath, wheezing, chest pain and worsening chronic back pain. She notes her upper respiratory symptoms have improved over the past week. Denies history of cardiac disease. VSS. Exam revealed mild bilateral maxillary sinus tenderness. Anterior chest wall diffusely tender to palpation. Remaining exam unremarkable. No neuro deficits. No back pain red flags. EKG revealed sinus rhythm, otherwise no significant change from prior. Troponin negative. Labs unremarkable. Chest x-ray negative. HEART score 3. I have a low suspicion for ACS, PE, dissection, or other acute cardiac event at this time. No risk factors for DVT/PT. Pt has remained at normal HR while in the ED. On reevaluation patient reports her chest pain has significantly improved. She notes after belching on the ED her chest pain resolved. I suspect pt's sxs are likely due to viral URI with associated GERD vs musculoskeletal chest wall pain. Plan to d/c pt home with symptomatic tx. discussed results and plan for discharge with patient. Advised patient to follow up with her PCP. Pt given info to follow up with cardiology if her CP does not improve over the next week. Discussed strict return precautions with pt.       Satira Sark [place], [place] 02/14/16 2120  Lyndal Pulley, MD 02/15/16 [phone]

## 2016-02-14 NOTE — ED Notes (Signed)
Per pt, states cough and back pain for over a week

## 2016-02-14 NOTE — Discharge Instructions (Signed)
Take your medications as prescribed. I also recommend continuing to drink fluids at home to remain hydrated. Follow-up with your primary care provider in the next 4-5 days if your symptoms have not improved or have worsened.  I also recommend following up with the cardiology clinic listed above if your chest pain has not improved over the next 1 week.  Please return to the Emergency Department if symptoms worsen or new onset of fever, headache, neck stiffness, difficulty breathing, coughing up blood, worsening/new chest pain, vomiting, numbness, tingling, weakness, leg swelling.

## 2016-02-14 NOTE — ED Notes (Signed)
PT DISCHARGED. INSTRUCTIONS AND PRESCRIPTIONS GIVEN. AAOX3. PT IN NO APPARENT DISTRESS OR PAIN. THE OPPORTUNITY TO ASK QUESTIONS WAS PROVIDED. 

## 2016-03-14 ENCOUNTER — Encounter (HOSPITAL_COMMUNITY): Payer: Self-pay

## 2016-03-14 ENCOUNTER — Emergency Department (HOSPITAL_COMMUNITY)
Admission: EM | Admit: 2016-03-14 | Discharge: 2016-03-14 | Disposition: A | Discharge status: Home / Self Care | Payer: No Typology Code available for payment source | Attending: Emergency Medicine | Admitting: Emergency Medicine

## 2016-03-14 DIAGNOSIS — M5441 Lumbago with sciatica, right side: Secondary | ICD-10-CM | POA: Insufficient documentation

## 2016-03-14 DIAGNOSIS — Z791 Long term (current) use of non-steroidal anti-inflammatories (NSAID): Secondary | ICD-10-CM | POA: Insufficient documentation

## 2016-03-14 DIAGNOSIS — I10 Essential (primary) hypertension: Secondary | ICD-10-CM | POA: Insufficient documentation

## 2016-03-14 DIAGNOSIS — F17210 Nicotine dependence, cigarettes, uncomplicated: Secondary | ICD-10-CM | POA: Insufficient documentation

## 2016-03-14 DIAGNOSIS — Z79899 Other long term (current) drug therapy: Secondary | ICD-10-CM | POA: Insufficient documentation

## 2016-03-14 MED ORDER — PREDNISONE 20 MG PO TABS: 60.0000 mg | ORAL_TABLET | Freq: Every day | ORAL | Status: DC

## 2016-03-14 MED ORDER — METHOCARBAMOL 500 MG PO TABS: 500.0000 mg | ORAL_TABLET | Freq: Two times a day (BID) | ORAL | Status: DC

## 2016-03-14 MED ORDER — METHOCARBAMOL 500 MG PO TABS
1000.0000 mg | ORAL_TABLET | Freq: Once | ORAL | Status: AC
  Administered 2016-03-14: 1000 mg via ORAL
  Filled 2016-03-14: qty 2

## 2016-03-14 NOTE — ED Notes (Signed)
Declined W/C at D/C and was escorted to lobby by RN. 

## 2016-03-14 NOTE — Discharge Instructions (Signed)
Please read and follow all provided instructions.  Your diagnoses today include:  1. Right-sided low back pain with right-sided sciatica     Tests performed today include:  Vital signs - see below for your results today  Medications prescribed:   Take any prescribed medications only as directed.  Home care instructions:   Follow any educational materials contained in this packet  Please rest, use ice or heat on your back for the next several days  Do not lift, push, pull anything more than 10 pounds for the next week  Follow-up instructions: Please follow-up with your primary care provider in the next 1 week for further evaluation of your symptoms.   Return instructions:  SEEK IMMEDIATE MEDICAL ATTENTION IF YOU HAVE:  New numbness, tingling, weakness, or problem with the use of your arms or legs  Severe back pain not relieved with medications  Loss control of your bowels or bladder  Increasing pain in any areas of the body (such as chest or abdominal pain)  Shortness of breath, dizziness, or fainting.   Worsening nausea (feeling sick to your stomach), vomiting, fever, or sweats  Any other emergent concerns regarding your health   Additional Information:  Your vital signs today were: BP 140/89 mmHg   Pulse 90   Temp(Src) 98.1 F (36.7 C) (Oral)   Resp 20   Wt 83.632 kg   SpO2 96%   LMP 01/26/2000 If your blood pressure (BP) was elevated above 135/85 this visit, please have this repeated by your doctor within one month. --------------

## 2016-03-14 NOTE — ED Notes (Signed)
Patient here with right sided back pain with radiation down right leg, pain worse with movement

## 2016-03-14 NOTE — ED Provider Notes (Signed)
CSN: [id number]     Arrival date & time 03/14/16  1336 History  By signing my name below, I, Tanda Rockers, attest that this documentation has been prepared under the direction and in the presence of Audry Pili, PA-C.  Electronically Signed: Tanda Rockers, ED Scribe. 03/14/2016. 2:30 PM.   Chief Complaint  Patient presents with  . Back Pain   The history is provided by the patient. No language interpreter was used.    HPI Comments: Amy [name] is a 50 y.o. female who presents to the Emergency Department complaining of gradual onset, constant, worsening, right lower back pain x 3-4 months. Pt reports that she has been having issues with her back for the past year but that it has worsened over the past couple of months. The pain intermittently radiates down her right leg. Pt has been taking Aleve without relief. She has never been seen for this pain in the past. Denies fever, urinary or bowel incontinence, saddle anesthesia, gait difficulty, dysuria, or any other associated symptoms.   Past Medical History  Diagnosis Date  . Depression   . Grave's disease 2007    TSH (08/31/2010) = 0.186 (low), free T4 = 0.91 (WNL)  . Anxiety   . Hypertension   . Postsurgical hypothyroidism   . Raynaud disease 2007  . Brain aneurysm    Past Surgical History  Procedure Laterality Date  . Colonoscopy  02/14/2008    done by Dr. Christella Hartigan - small colonic polyp identified, 6 mm in size and per biopsy --> tubular adenoma with no malignancy or high grade dysplasia noted  . Abdominal hysterectomy    . Thyroidectomy  03/08/2012    [facility]; history of Graves' disease with multinodular goiter  . Wisdom tooth extraction     Family History  Problem Relation Age of Onset  . Colon cancer Maternal Grandfather   . Cancer Maternal Grandfather     prostate  . Hypertension Mother   . Thyroid disease Mother   . Lupus Mother   . Hypertension Father    Social History  Substance Use Topics  . Smoking  status: Current Every Day Smoker -- 0.20 packs/day for 35 years    Types: Cigarettes    Last Attempt to Quit: 10/13/1996  . Smokeless tobacco: None  . Alcohol Use: No   OB History    Gravida Para Term Preterm AB TAB SAB Ectopic Multiple Living   0  0   2     Review of Systems  Gastrointestinal:       Negative for urinary incontinence  Genitourinary: Negative for dysuria.       Negative for bowel incontinence  Musculoskeletal: Positive for back pain. Negative for gait problem.  Neurological: Negative for weakness and numbness.   Allergies  Adhesive  Home Medications   Prior to Admission medications   Medication Sig Start Date End Date Taking? Authorizing Provider  amitriptyline (ELAVIL) 100 MG tablet Take 1 tablet (100 mg total) by mouth at bedtime. 07/26/15   Thermon Leyland, NP  benzonatate (TESSALON) 100 MG capsule Take 2 capsules (200 mg total) by mouth 2 (two) times daily as needed for cough. 02/14/16   Barrett Henle, PA-C  cetirizine (ZYRTEC) 10 MG tablet Take 1 tablet (10 mg total) by mouth daily. 07/26/15   Thermon Leyland, NP  diclofenac (VOLTAREN) 75 MG EC tablet Take 1 tablet (75 mg total) by mouth 2 (two) times daily. 07/26/15   Vernona Rieger  Jenness CornerA Davis, NP  gabapentin (NEURONTIN) 300 MG capsule Take 1 capsule (300 mg total) by mouth 2 (two) times daily. 07/26/15   Thermon LeylandLaura A Davis, NP  hydrochlorothiazide (HYDRODIURIL) 12.5 MG tablet Take 12.5 mg by mouth daily.    Historical Provider, MD  hydrOXYzine (ATARAX/VISTARIL) 25 MG tablet Take 1 tablet (25 mg total) by mouth every 6 (six) hours as needed for anxiety. 07/26/15   Thermon LeylandLaura A Davis, NP  ibuprofen (ADVIL,MOTRIN) 600 MG tablet Take 1 tablet (600 mg total) by mouth every 6 (six) hours as needed. 02/14/16   Barrett HenleNicole Elizabeth Nadeau, PA-C  levothyroxine (SYNTHROID, LEVOTHROID) 125 MCG tablet Take 1 tablet (125 mcg total) by mouth daily before breakfast. 08/06/15   Garlon HatchetLisa M Sanders, PA-C  metoprolol tartrate (LOPRESSOR) 25 MG tablet  Take 1 tablet (25 mg total) by mouth 2 (two) times daily. 07/26/15   Thermon LeylandLaura A Davis, NP  predniSONE (DELTASONE) 10 MG tablet Sig: 4 tables once a day for 3 days, 3 tablets once a day X3 days, 2 tablets a day for 3 days, 1 tablet a day for 3 days. Patient not taking: Reported on 02/14/2016 11/05/15   Tharon AquasFrank C Patrick, PA   BP 140/89 mmHg  Pulse 90  Temp(Src) 98.1 F (36.7 C) (Oral)  Resp 20  Wt 184 lb 6 oz (83.632 kg)  SpO2 96%  LMP 01/26/2000   Physical Exam  Constitutional: She is oriented to person, place, and time. She appears well-developed and well-nourished. No distress.  HENT:  Head: Normocephalic and atraumatic.  Eyes: Conjunctivae and EOM are normal.  Neck: Neck supple. No tracheal deviation present.  Cardiovascular: Normal rate.   Pulmonary/Chest: Effort normal. No respiratory distress.  Musculoskeletal: Normal range of motion. She exhibits tenderness.  Positive straight leg raise on right No spinous process tenderness TTP at the musculature on the right low back. Non tender on left.   Neurological: She is alert and oriented to person, place, and time.  Skin: Skin is warm and dry.  Psychiatric: She has a normal mood and affect. Her behavior is normal.  Nursing note and vitals reviewed.   ED Course  Procedures (including critical care time)  DIAGNOSTIC STUDIES: Oxygen Saturation is 96% on RA, normal by my interpretation.    COORDINATION OF CARE: 2:28 PM-Discussed treatment plan which includes Rx muscle relaxant and Prednisone with pt at bedside and pt agreed to plan.   Labs Review Labs Reviewed - No data to display  Imaging Review No results found. I have personally reviewed and evaluated these images and lab results as part of my medical decision-making.   EKG Interpretation None      MDM  I have reviewed the relevant previous healthcare records. I obtained HPI from historian.  ED Course:  Assessment: Patient with back pain.  No neurological deficits and  normal neuro exam.  Patient is ambulatory.  No loss of bowel or bladder control.  No concern for cauda equina.  No fever, night sweats, weight loss, h/o cancer, IVDA, no recent procedure to back. No urinary symptoms suggestive of UTI.  Likely sciatica based on exam. Given steroid and muscle relaxant. Supportive care and return precaution discussed. Appears safe for discharge at this time. Follow up as indicated in discharge paperwork.   Disposition/Plan:  DC Home Additional Verbal discharge instructions given and discussed with patient.  Pt Instructed to f/u with PCP in the next week for evaluation and treatment of symptoms. Return precautions given Pt acknowledges and agrees with plan  Supervising Physician Tilden FossaElizabeth Rees, MD   Final diagnoses:  Right-sided low back pain with right-sided sciatica   I personally performed the services described in this documentation, which was scribed in my presence. The recorded information has been reviewed and is accurate.    Audry Piliyler Nicklous Aburto, PA-C 03/14/16 1433  Tilden FossaElizabeth Rees, MD 03/16/16 [phone]

## 2016-03-19 ENCOUNTER — Encounter (HOSPITAL_COMMUNITY): Payer: Self-pay | Admitting: *Deleted

## 2016-03-19 ENCOUNTER — Emergency Department (HOSPITAL_COMMUNITY): Payer: Self-pay

## 2016-03-19 ENCOUNTER — Emergency Department (HOSPITAL_COMMUNITY)
Admission: EM | Admit: 2016-03-19 | Discharge: 2016-03-19 | Disposition: A | Discharge status: Home / Self Care | Payer: Self-pay | Attending: Emergency Medicine | Admitting: Emergency Medicine

## 2016-03-19 DIAGNOSIS — M5441 Lumbago with sciatica, right side: Secondary | ICD-10-CM | POA: Insufficient documentation

## 2016-03-19 DIAGNOSIS — I10 Essential (primary) hypertension: Secondary | ICD-10-CM | POA: Insufficient documentation

## 2016-03-19 DIAGNOSIS — Z79899 Other long term (current) drug therapy: Secondary | ICD-10-CM | POA: Insufficient documentation

## 2016-03-19 DIAGNOSIS — F17210 Nicotine dependence, cigarettes, uncomplicated: Secondary | ICD-10-CM | POA: Insufficient documentation

## 2016-03-19 MED ORDER — OXYCODONE-ACETAMINOPHEN 5-325 MG PO TABS
1.0000 | ORAL_TABLET | Freq: Once | ORAL | Status: AC
  Administered 2016-03-19: 1 via ORAL
  Filled 2016-03-19: qty 1

## 2016-03-19 MED ORDER — OXYCODONE-ACETAMINOPHEN 5-325 MG PO TABS: 2.0000 | ORAL_TABLET | Freq: Four times a day (QID) | ORAL | Status: DC | PRN

## 2016-03-19 NOTE — Discharge Instructions (Signed)
Please follow-up with orthopedic specialist for further evaluation and management of ongoing symptoms. Please return immediately if any new or worsening signs or symptoms present.  Back Exercises The following exercises strengthen the muscles that help to support the back. They also help to keep the lower back flexible. Doing these exercises can help to prevent back pain or lessen existing pain. If you have back pain or discomfort, try doing these exercises 2-3 times each day or as told by your health care provider. When the pain goes away, do them once each day, but increase the number of times that you repeat the steps for each exercise (do more repetitions). If you do not have back pain or discomfort, do these exercises once each day or as told by your health care provider. EXERCISES Single Knee to Chest Repeat these steps 3-5 times for each leg:  Lie on your back on a firm bed or the floor with your legs extended.  Bring one knee to your chest. Your other leg should stay extended and in contact with the floor.  Hold your knee in place by grabbing your knee or thigh.  Pull on your knee until you feel a gentle stretch in your lower back.  Hold the stretch for 10-30 seconds.  Slowly release and straighten your leg. Pelvic Tilt Repeat these steps 5-10 times:  Lie on your back on a firm bed or the floor with your legs extended.  Bend your knees so they are pointing toward the ceiling and your feet are flat on the floor.  Tighten your lower abdominal muscles to press your lower back against the floor. This motion will tilt your pelvis so your tailbone points up toward the ceiling instead of pointing to your feet or the floor.  With gentle tension and even breathing, hold this position for 5-10 seconds. Cat-Cow Repeat these steps until your lower back becomes more flexible:  Get into a hands-and-knees position on a firm surface. Keep your hands under your shoulders, and keep your knees  under your hips. You may place padding under your knees for comfort.  Let your head hang down, and point your tailbone toward the floor so your lower back becomes rounded like the back of a cat.  Hold this position for 5 seconds.  Slowly lift your head and point your tailbone up toward the ceiling so your back forms a sagging arch like the back of a cow.  Hold this position for 5 seconds. Press-Ups Repeat these steps 5-10 times:  Lie on your abdomen (face-down) on the floor.  Place your palms near your head, about shoulder-width apart.  While you keep your back as relaxed as possible and keep your hips on the floor, slowly straighten your arms to raise the top half of your body and lift your shoulders. Do not use your back muscles to raise your upper torso. You may adjust the placement of your hands to make yourself more comfortable.  Hold this position for 5 seconds while you keep your back relaxed.  Slowly return to lying flat on the floor. Bridges Repeat these steps 10 times:  Lie on your back on a firm surface.  Bend your knees so they are pointing toward the ceiling and your feet are flat on the floor.  Tighten your buttocks muscles and lift your buttocks off of the floor until your waist is at almost the same height as your knees. You should feel the muscles working in your buttocks and the back  of your thighs. If you do not feel these muscles, slide your feet 1-2 inches farther away from your buttocks.  Hold this position for 3-5 seconds.  Slowly lower your hips to the starting position, and allow your buttocks muscles to relax completely. If this exercise is too easy, try doing it with your arms crossed over your chest. Abdominal Crunches Repeat these steps 5-10 times:  Lie on your back on a firm bed or the floor with your legs extended.  Bend your knees so they are pointing toward the ceiling and your feet are flat on the floor.  Cross your arms over your  chest.  Tip your chin slightly toward your chest without bending your neck.  Tighten your abdominal muscles and slowly raise your trunk (torso) high enough to lift your shoulder blades a tiny bit off of the floor. Avoid raising your torso higher than that, because it can put too much stress on your low back and it does not help to strengthen your abdominal muscles.  Slowly return to your starting position. Back Lifts Repeat these steps 5-10 times:  Lie on your abdomen (face-down) with your arms at your sides, and rest your forehead on the floor.  Tighten the muscles in your legs and your buttocks.  Slowly lift your chest off of the floor while you keep your hips pressed to the floor. Keep the back of your head in line with the curve in your back. Your eyes should be looking at the floor.  Hold this position for 3-5 seconds.  Slowly return to your starting position. SEEK MEDICAL CARE IF:  Your back pain or discomfort gets much worse when you do an exercise.  Your back pain or discomfort does not lessen within 2 hours after you exercise. If you have any of these problems, stop doing these exercises right away. Do not do them again unless your health care provider says that you can. SEEK IMMEDIATE MEDICAL CARE IF:  You develop sudden, severe back pain. If this happens, stop doing the exercises right away. Do not do them again unless your health care provider says that you can.   This information is not intended to replace advice given to you by your health care provider. Make sure you discuss any questions you have with your health care provider.   Document Released: 10/14/2004 Document Revised: 05/28/2015 Document Reviewed: 10/31/2014 Elsevier Interactive Patient Education 2016 [employer] Sciatica Sciatica is pain, weakness, numbness, or tingling along the path of the sciatic nerve. The nerve starts in the lower back and runs down the back of each leg. The nerve controls the  muscles in the lower leg and in the back of the knee, while also providing sensation to the back of the thigh, lower leg, and the sole of your foot. Sciatica is a symptom of another medical condition. For instance, nerve damage or certain conditions, such as a herniated disk or bone spur on the spine, pinch or put pressure on the sciatic nerve. This causes the pain, weakness, or other sensations normally associated with sciatica. Generally, sciatica only affects one side of the body. CAUSES   Herniated or slipped disc.  Degenerative disk disease.  A pain disorder involving the narrow muscle in the buttocks (piriformis syndrome).  Pelvic injury or fracture.  Pregnancy.  Tumor (rare). SYMPTOMS  Symptoms can vary from mild to very severe. The symptoms usually travel from the low back to the buttocks and down the back of the leg. Symptoms can  include:  Mild tingling or dull aches in the lower back, leg, or hip.  Numbness in the back of the calf or sole of the foot.  Burning sensations in the lower back, leg, or hip.  Sharp pains in the lower back, leg, or hip.  Leg weakness.  Severe back pain inhibiting movement. These symptoms may get worse with coughing, sneezing, laughing, or prolonged sitting or standing. Also, being overweight may worsen symptoms. DIAGNOSIS  Your caregiver will perform a physical exam to look for common symptoms of sciatica. He or she may ask you to do certain movements or activities that would trigger sciatic nerve pain. Other tests may be performed to find the cause of the sciatica. These may include:  Blood tests.  X-rays.  Imaging tests, such as an MRI or CT scan. TREATMENT  Treatment is directed at the cause of the sciatic pain. Sometimes, treatment is not necessary and the pain and discomfort goes away on its own. If treatment is needed, your caregiver may suggest:  Over-the-counter medicines to relieve pain.  Prescription medicines, such as  anti-inflammatory medicine, muscle relaxants, or narcotics.  Applying heat or ice to the painful area.  Steroid injections to lessen pain, irritation, and inflammation around the nerve.  Reducing activity during periods of pain.  Exercising and stretching to strengthen your abdomen and improve flexibility of your spine. Your caregiver may suggest losing weight if the extra weight makes the back pain worse.  Physical therapy.  Surgery to eliminate what is pressing or pinching the nerve, such as a bone spur or part of a herniated disk. HOME CARE INSTRUCTIONS   Only take over-the-counter or prescription medicines for pain or discomfort as directed by your caregiver.  Apply ice to the affected area for 20 minutes, 3-4 times a day for the first 48-72 hours. Then try heat in the same way.  Exercise, stretch, or perform your usual activities if these do not aggravate your pain.  Attend physical therapy sessions as directed by your caregiver.  Keep all follow-up appointments as directed by your caregiver.  Do not wear high heels or shoes that do not provide proper support.  Check your mattress to see if it is too soft. A firm mattress may lessen your pain and discomfort. SEEK IMMEDIATE MEDICAL CARE IF:   You lose control of your bowel or bladder (incontinence).  You have increasing weakness in the lower back, pelvis, buttocks, or legs.  You have redness or swelling of your back.  You have a burning sensation when you urinate.  You have pain that gets worse when you lie down or awakens you at night.  Your pain is worse than you have experienced in the past.  Your pain is lasting longer than 4 weeks.  You are suddenly losing weight without reason. MAKE SURE YOU:  Understand these instructions.  Will watch your condition.  Will get help right away if you are not doing well or get worse.   This information is not intended to replace advice given to you by your health care  provider. Make sure you discuss any questions you have with your health care provider.   Document Released: 08/31/2001 Document Revised: 05/28/2015 Document Reviewed: 01/16/2012 Elsevier Interactive Patient Education [employer]

## 2016-03-19 NOTE — ED Notes (Signed)
The pt is c/o lower rt hip pain for 2 weeks no known injury  She has been seen here for the same

## 2016-03-19 NOTE — ED Provider Notes (Signed)
CSN: [id number]     Arrival date & time 03/19/16  1621 History  By signing my name below, I, Amy [name], attest that this documentation has been prepared under the direction and in the presence of Newell RubbermaidJeffrey Cattie Tineo, PA-C. Electronically Signed: Ronney LionSuzanne [name], ED Scribe. 03/19/2016. 5:53 PM.    Chief Complaint  Patient presents with  . Back Pain   The history is provided by the patient and medical records. No language interpreter was used.   HPI Comments: Amy [name] is a 50 y.o. female with a history of depression, anxiety, Grave's disease, and hypertension, who presents to the Emergency Department complaining of constant, worsening, burning right lower back pain radiating to her right hip that began several months ago. Per chart review, she was seen in the ED by Audry Piliyler Mohr, PA-C on 03/14/16 for right lower back pain, was diagnosed with sciatica, and was given Robaxin and prednisone. She states she returns to the ED because her pain has been worsening despite taking the medications. She also requests an x-ray, as she states she did not have imaging when she was last seen. Patient states she had tried Aleve with no relief to her pain. She denies any frequent heavy lifting at her job. She states she has had back pain in the past, but the pain was tolerable and resolved without issue. She states she has never had back pain as severe as this episode. She denies extremity numbness or tingling, fever, or bowel or bladder incontinence.   Past Medical History  Diagnosis Date  . Depression   . Grave's disease 2007    TSH (08/31/2010) = 0.186 (low), free T4 = 0.91 (WNL)  . Anxiety   . Hypertension   . Postsurgical hypothyroidism   . Raynaud disease 2007  . Brain aneurysm    Past Surgical History  Procedure Laterality Date  . Colonoscopy  02/14/2008    done by Dr. Christella HartiganJacobs - small colonic polyp identified, 6 mm in size and per biopsy --> tubular adenoma with no malignancy or high grade dysplasia noted  .  Abdominal hysterectomy    . [facility]; history of Graves' disease with multinodular goiter  . Wisdom tooth extraction     Family History  Problem Relation Age of Onset  . Colon cancer Maternal Grandfather   . Cancer Maternal Grandfather     prostate  . Hypertension Mother   . Thyroid disease Mother   . Lupus Mother   . Hypertension Father    Social History  Substance Use Topics  . Smoking status: Current Every Day Smoker -- 0.20 packs/day for 35 years    Types: Cigarettes    Last Attempt to Quit: 10/13/1996  . Smokeless tobacco: None  . Alcohol Use: No   OB History    Gravida Para Term Preterm AB TAB SAB Ectopic Multiple Living   3 3 2 1  0  0   2     Review of Systems  Constitutional: Negative for fever.  Gastrointestinal:       Negative for bowel incontinence.    Genitourinary:       Negative for bladder incontinence.    Musculoskeletal: Positive for back pain.  Neurological: Negative for numbness.      Allergies  Adhesive  Home Medications   Prior to Admission medications   Medication Sig Start Date End Date Taking? Authorizing Provider  amitriptyline (ELAVIL) 100 MG tablet Take 1 tablet (100 mg total) by mouth  at bedtime. 07/26/15   Thermon Leyland, NP  benzonatate (TESSALON) 100 MG capsule Take 2 capsules (200 mg total) by mouth 2 (two) times daily as needed for cough. 02/14/16   Barrett Henle, PA-C  cetirizine (ZYRTEC) 10 MG tablet Take 1 tablet (10 mg total) by mouth daily. 07/26/15   Thermon Leyland, NP  diclofenac (VOLTAREN) 75 MG EC tablet Take 1 tablet (75 mg total) by mouth 2 (two) times daily. 07/26/15   Thermon Leyland, NP  gabapentin (NEURONTIN) 300 MG capsule Take 1 capsule (300 mg total) by mouth 2 (two) times daily. 07/26/15   Thermon Leyland, NP  hydrochlorothiazide (HYDRODIURIL) 12.5 MG tablet Take 12.5 mg by mouth daily.    Historical Provider, MD  hydrOXYzine (ATARAX/VISTARIL) 25 MG tablet Take 1 tablet (25 mg  total) by mouth every 6 (six) hours as needed for anxiety. 07/26/15   Thermon Leyland, NP  ibuprofen (ADVIL,MOTRIN) 600 MG tablet Take 1 tablet (600 mg total) by mouth every 6 (six) hours as needed. 02/14/16   Barrett Henle, PA-C  levothyroxine (SYNTHROID, LEVOTHROID) 125 MCG tablet Take 1 tablet (125 mcg total) by mouth daily before breakfast. 08/06/15   Garlon Hatchet, PA-C  methocarbamol (ROBAXIN) 500 MG tablet Take 1 tablet (500 mg total) by mouth 2 (two) times daily. 03/14/16   Audry Pili, PA-C  metoprolol tartrate (LOPRESSOR) 25 MG tablet Take 1 tablet (25 mg total) by mouth 2 (two) times daily. 07/26/15   Thermon Leyland, NP  oxyCODONE-acetaminophen (PERCOCET/ROXICET) 5-325 MG tablet Take 2 tablets by mouth every 6 (six) hours as needed for severe pain. 03/19/16   Eyvonne Mechanic, PA-C  predniSONE (DELTASONE) 20 MG tablet Take 3 tablets (60 mg total) by mouth daily. 03/14/16   Audry Pili, PA-C   BP 130/83 mmHg  Pulse 86  Temp(Src) 98.3 F (36.8 C) (Oral)  Resp 14  SpO2 100%  LMP 01/26/2000 Physical Exam  Constitutional: She is oriented to person, place, and time. She appears well-developed and well-nourished. No distress.  HENT:  Head: Normocephalic.  Neck: Normal range of motion. Neck supple.  Pulmonary/Chest: Effort normal.  Musculoskeletal: Normal range of motion. She exhibits tenderness. She exhibits no edema.  No C, T, or L spine tenderness to palpation. No obvious signs of trauma, deformity, infection, step-offs. Lung expansion normal. No scoliosis or kyphosis. Bilateral lower extremity strength 5 out of 5, sensation grossly intact, patellar reflexes 2+, pedal pulses 2+, Refill less than 3 seconds.  Straight leg negative Ambulates with minimal difficulty   Neurological: She is alert and oriented to person, place, and time.  Skin: Skin is warm and dry. She is not diaphoretic.  Psychiatric: She has a normal mood and affect. Her behavior is normal. Judgment and thought content  normal.  Nursing note and vitals reviewed.   ED Course  Procedures (including critical care time)  DIAGNOSTIC STUDIES: Oxygen Saturation is 98% on RA, normal by my interpretation.    COORDINATION OF CARE: 5:22 PM - Discussed treatment plan with pt at bedside which includes regular doses of Ibuprofen 600 mg daily, ice, and back exercises. Will administer one dose of Percocet in the ED today. Will also perform x-ray, per pt's request. Advised pt to follow up with an orthopedist. Pt verbalized understanding and agreed to plan.   Imaging Review Dg Lumbar Spine Complete  03/19/2016  CLINICAL DATA:  Low back pain radiating into the right leg for 2 weeks, no known injury, initial encounter EXAM:  LUMBAR SPINE - COMPLETE 4+ VIEW COMPARISON:  None. FINDINGS: Five lumbar type vertebral bodies are well visualized. Vertebral body height is well maintained. Mild facet hypertrophic changes are noted at L4-5 and L5-S1. No significant anterolisthesis is noted. Mild osteophytic changes are seen as well. IMPRESSION: Degenerative change without acute abnormality. Electronically Signed   By: Alcide CleverMark  Lukens M.D.   On: 03/19/2016 18:11   I have personally reviewed and evaluated these images and lab results as part of my medical decision-making.  MDM   Final diagnoses:  Right-sided low back pain with right-sided sciatica   Labs:  Imaging: DG Lumbar Spine  Consults:  Therapeutics:Percocet  Discharge Meds: Percocet  Assessment/Plan:  Patient with back pain.  No neurological deficits and normal neuro exam.  Patient can walk but states is painful.  No loss of bowel or bladder control.  No concern for cauda equina.  No fever, night sweats, weight loss, h/o cancer, IVDU.  RICE protocol and pain medicine indicated and discussed with patient.    I personally performed the services described in this documentation, which was scribed in my presence. The recorded information has been reviewed and is  accurate.   Eyvonne MechanicJeffrey Neelam Tiggs, PA-C 03/20/16 0031  Derwood KaplanAnkit Nanavati, MD 03/20/16 [phone]

## 2016-03-22 ENCOUNTER — Emergency Department (HOSPITAL_COMMUNITY)
Admission: EM | Admit: 2016-03-22 | Discharge: 2016-03-22 | Disposition: A | Discharge status: Home / Self Care | Payer: No Typology Code available for payment source | Attending: Emergency Medicine | Admitting: Emergency Medicine

## 2016-03-22 ENCOUNTER — Encounter (HOSPITAL_COMMUNITY): Payer: Self-pay | Admitting: *Deleted

## 2016-03-22 DIAGNOSIS — M5441 Lumbago with sciatica, right side: Secondary | ICD-10-CM | POA: Insufficient documentation

## 2016-03-22 DIAGNOSIS — F17210 Nicotine dependence, cigarettes, uncomplicated: Secondary | ICD-10-CM | POA: Insufficient documentation

## 2016-03-22 DIAGNOSIS — I10 Essential (primary) hypertension: Secondary | ICD-10-CM | POA: Insufficient documentation

## 2016-03-22 DIAGNOSIS — M5431 Sciatica, right side: Secondary | ICD-10-CM

## 2016-03-22 DIAGNOSIS — Z79899 Other long term (current) drug therapy: Secondary | ICD-10-CM | POA: Insufficient documentation

## 2016-03-22 LAB — URINALYSIS, ROUTINE W REFLEX MICROSCOPIC
BILIRUBIN URINE: NEGATIVE
GLUCOSE, UA: NEGATIVE mg/dL
HGB URINE DIPSTICK: NEGATIVE
KETONES UR: NEGATIVE mg/dL
Leukocytes, UA: NEGATIVE
Nitrite: NEGATIVE
PROTEIN: NEGATIVE mg/dL
Specific Gravity, Urine: 1.025 (ref 1.005–1.030)
pH: 5.5 (ref 5.0–8.0)

## 2016-03-22 MED ORDER — CYCLOBENZAPRINE HCL 10 MG PO TABS: 10.0000 mg | ORAL_TABLET | Freq: Two times a day (BID) | ORAL | Status: DC | PRN

## 2016-03-22 MED ORDER — DICLOFENAC SODIUM 50 MG PO TBEC: 50.0000 mg | DELAYED_RELEASE_TABLET | Freq: Two times a day (BID) | ORAL | Status: DC

## 2016-03-22 MED ORDER — KETOROLAC TROMETHAMINE 60 MG/2ML IM SOLN
60.0000 mg | Freq: Once | INTRAMUSCULAR | Status: AC
  Administered 2016-03-22: 60 mg via INTRAMUSCULAR
  Filled 2016-03-22: qty 2

## 2016-03-22 MED ORDER — CYCLOBENZAPRINE HCL 10 MG PO TABS
10.0000 mg | ORAL_TABLET | Freq: Once | ORAL | Status: AC
  Administered 2016-03-22: 10 mg via ORAL
  Filled 2016-03-22: qty 1

## 2016-03-22 NOTE — Discharge Instructions (Signed)
Keep your appointment with Dr. Eulah PontMurphy. Take the medication as directed.

## 2016-03-22 NOTE — ED Provider Notes (Signed)
History   Chief Complaint  Patient presents with  . Leg Pain   The history is provided by the patient and medical records. No language interpreter was used.    HPI Comments:  Amy [name] is a 50 y.o. female with PMHx of sciatica who presents to the Emergency Department complaining of right lower back pain that has been ongoing for the past several months. She states the pain radiates down her right buttocks into her right hip. Pt was seen here three days ago and 8 days ago with the same symptoms and states she is now out of her Percocet and muscle relaxer. She expresses concern for a UTI as well. She states she has an [facility] four days on 03/26/16. She does not note any modifying factors. She denies numbness, tingling or weakness of any extremity, fever, chills. Patient request U/A to be sure no UTI.  Past Medical History  Diagnosis Date  . Depression   . Grave's disease 2007    TSH (08/31/2010) = 0.186 (low), free T4 = 0.91 (WNL)  . Anxiety   . Hypertension   . Postsurgical hypothyroidism   . Raynaud disease 2007  . Brain aneurysm    Past Surgical History  Procedure Laterality Date  . Colonoscopy  02/14/2008    done by Dr. Christella HartiganJacobs - small colonic polyp identified, 6 mm in size and per biopsy --> tubular adenoma with no malignancy or high grade dysplasia noted  . Abdominal hysterectomy    . Thyroide[facility]  03/08/2012    [facility]; history of Graves' disease with multinodular goiter  . Wisdom tooth extraction     Family History  Problem Relation Age of Onset  . Colon cancer Maternal Grandfather   . Cancer Maternal Grandfather     prostate  . Hypertension Mother   . Thyroid disease Mother   . Lupus Mother   . Hypertension Father    Social History  Substance Use Topics  . Smoking status: Current Every Day Smoker -- 0.20 packs/day for 35 years    Types: Cigarettes    Last Attempt to Quit: 10/13/1996  . Smokeless tobacco: None   . Alcohol Use: No   OB History    Gravida Para Term Preterm AB TAB SAB Ectopic Multiple Living   3 3 2 1  0  0   2     Review of Systems  Constitutional: Negative for fever and chills.  Musculoskeletal: Positive for back pain.  Neurological: Negative for weakness and numbness.  All other systems reviewed and are negative.   Allergies  Adhesive  Home Medications   Prior to Admission medications   Medication Sig Start Date End Date Taking? Authorizing Provider  amitriptyline (ELAVIL) 100 MG tablet Take 1 tablet (100 mg total) by mouth at bedtime. 07/26/15   Thermon LeylandLaura A Davis, NP  benzonatate (TESSALON) 100 MG capsule Take 2 capsules (200 mg total) by mouth 2 (two) times daily as needed for cough. 02/14/16   Barrett HenleNicole Elizabeth Nadeau, PA-C  cetirizine (ZYRTEC) 10 MG tablet Take 1 tablet (10 mg total) by mouth daily. 07/26/15   Thermon LeylandLaura A Davis, NP  cyclobenzaprine (FLEXERIL) 10 MG tablet Take 1 tablet (10 mg total) by mouth 2 (two) times daily as needed for muscle spasms. 03/22/16   Montzerrat Brunell Orlene OchM Kostantinos Tallman, NP  diclofenac (VOLTAREN) 50 MG EC tablet Take 1 tablet (50 mg total) by mouth 2 (two) times daily. 03/22/16   Mekala Winger Orlene OchM Shenna Brissette, NP  gabapentin (NEURONTIN) 300 MG capsule Take 1 capsule (300 mg total) by mouth 2 (two) times daily. 07/26/15   Thermon LeylandLaura A Davis, NP  hydrochlorothiazide (HYDRODIURIL) 12.5 MG tablet Take 12.5 mg by mouth daily.    Historical Provider, MD  hydrOXYzine (ATARAX/VISTARIL) 25 MG tablet Take 1 tablet (25 mg total) by mouth every 6 (six) hours as needed for anxiety. 07/26/15   Thermon LeylandLaura A Davis, NP  levothyroxine (SYNTHROID, LEVOTHROID) 125 MCG tablet Take 1 tablet (125 mcg total) by mouth daily before breakfast. 08/06/15   Garlon HatchetLisa M Sanders, PA-C  metoprolol tartrate (LOPRESSOR) 25 MG tablet Take 1 tablet (25 mg total) by mouth 2 (two) times daily. 07/26/15   Thermon LeylandLaura A Davis, NP  oxyCODONE-acetaminophen (PERCOCET/ROXICET) 5-325 MG tablet Take 2 tablets by mouth every 6 (six) hours as needed for severe pain.  03/19/16   Eyvonne MechanicJeffrey Hedges, PA-C  predniSONE (DELTASONE) 20 MG tablet Take 3 tablets (60 mg total) by mouth daily. 03/14/16   Audry Piliyler Mohr, PA-C   Triage Vitals: BP 113/71 mmHg  Pulse 87  Temp(Src) 98.5 F (36.9 C) (Oral)  Resp 18  Ht 5\' 5"  (1.651 m)  Wt 188 lb 14.4 oz (85.684 kg)  BMI 31.43 kg/m2  SpO2 98%  LMP 01/26/2000 Physical Exam  Constitutional: She is oriented to person, place, and time. She appears well-developed and well-nourished. No distress.  HENT:  Head: Normocephalic and atraumatic.  Right Ear: Tympanic membrane normal.  Left Ear: Tympanic membrane normal.  Nose: Nose normal.  Mouth/Throat: Uvula is midline, oropharynx is clear and moist and mucous membranes are normal.  Eyes: EOM are normal.  Neck: Normal range of motion. Neck supple.  Cardiovascular: Normal rate and regular rhythm.   Pulmonary/Chest: Effort normal. She has no wheezes. She has no rales.  Abdominal: Soft. Bowel sounds are normal. There is no tenderness.  Musculoskeletal: Normal range of motion.       Lumbar back: She exhibits tenderness, pain and spasm. She exhibits normal pulse.  Pain on palpation to right sciatic nerve.  Neurological: She is alert and oriented to person, place, and time. She has normal strength. No cranial nerve deficit or sensory deficit. Gait normal.  Reflex Scores:      Bicep reflexes are 2+ on the right side and 2+ on the left side.      Brachioradialis reflexes are 2+ on the right side and 2+ on the left side.      Patellar reflexes are 2+ on the right side and 2+ on the left side.      Achilles reflexes are 2+ on the right side and 2+ on the left side. Skin: Skin is warm and dry.  Psychiatric: She has a normal mood and affect. Her behavior is normal.  Nursing note and vitals reviewed.   ED Course  Procedures (including critical care time) DIAGNOSTIC STUDIES: Oxygen Saturation is 98% on RA, normal by my interpretation.    Medications  ketorolac (TORADOL) injection 60  mg (60 mg Intramuscular Given 03/22/16 1812)  cyclobenzaprine (FLEXERIL) tablet 10 mg (10 mg Oral Given 03/22/16 1812)   Labs Review Labs Reviewed  URINALYSIS, ROUTINE W REFLEX MICROSCOPIC (NOT AT [facility]) - Abnormal; Notable for the following:    APPearance CLOUDY (*)    All other components within normal limits   MDM  50 y.o. female with hx of chronic back pain and pain that has increased over the past week. Stable for d/c without red flags to indicate need for immediate neuro consult. Patient improved  with treatment in the ED. She will f/u with her PCP.  Final diagnoses:  Sciatica, right        [facility], NP 03/23/16 1708  Marily Memos, MD 03/24/16 [phone]

## 2016-03-22 NOTE — ED Notes (Signed)
Patient verbalized understanding of discharge instructions and denies any further needs or questions at this time. VS stable. Patient ambulatory with steady gait.  

## 2016-03-22 NOTE — ED Notes (Signed)
Pt is here with right sided pain that radiates down leg and is to see specialist on Friday, but just needs something for pain.

## 2016-05-21 ENCOUNTER — Emergency Department (HOSPITAL_COMMUNITY)
Admission: EM | Admit: 2016-05-21 | Discharge: 2016-05-21 | Disposition: A | Discharge status: Home / Self Care | Payer: Self-pay | Attending: Emergency Medicine | Admitting: Emergency Medicine

## 2016-05-21 ENCOUNTER — Emergency Department (HOSPITAL_COMMUNITY): Payer: Self-pay

## 2016-05-21 ENCOUNTER — Encounter (HOSPITAL_COMMUNITY): Payer: Self-pay | Admitting: Emergency Medicine

## 2016-05-21 DIAGNOSIS — T18128A Food in esophagus causing other injury, initial encounter: Secondary | ICD-10-CM | POA: Insufficient documentation

## 2016-05-21 DIAGNOSIS — T18108A Unspecified foreign body in esophagus causing other injury, initial encounter: Secondary | ICD-10-CM

## 2016-05-21 DIAGNOSIS — Z79899 Other long term (current) drug therapy: Secondary | ICD-10-CM | POA: Insufficient documentation

## 2016-05-21 DIAGNOSIS — Z791 Long term (current) use of non-steroidal anti-inflammatories (NSAID): Secondary | ICD-10-CM | POA: Insufficient documentation

## 2016-05-21 DIAGNOSIS — X58XXXA Exposure to other specified factors, initial encounter: Secondary | ICD-10-CM | POA: Insufficient documentation

## 2016-05-21 DIAGNOSIS — I10 Essential (primary) hypertension: Secondary | ICD-10-CM | POA: Insufficient documentation

## 2016-05-21 DIAGNOSIS — M542 Cervicalgia: Secondary | ICD-10-CM | POA: Insufficient documentation

## 2016-05-21 DIAGNOSIS — F17210 Nicotine dependence, cigarettes, uncomplicated: Secondary | ICD-10-CM | POA: Insufficient documentation

## 2016-05-21 DIAGNOSIS — Y999 Unspecified external cause status: Secondary | ICD-10-CM | POA: Insufficient documentation

## 2016-05-21 DIAGNOSIS — Y929 Unspecified place or not applicable: Secondary | ICD-10-CM | POA: Insufficient documentation

## 2016-05-21 DIAGNOSIS — E039 Hypothyroidism, unspecified: Secondary | ICD-10-CM | POA: Insufficient documentation

## 2016-05-21 DIAGNOSIS — Y939 Activity, unspecified: Secondary | ICD-10-CM | POA: Insufficient documentation

## 2016-05-21 MED ORDER — GLUCAGON HCL RDNA (DIAGNOSTIC) 1 MG IJ SOLR
1.0000 mg | Freq: Once | INTRAMUSCULAR | Status: AC
  Administered 2016-05-21: 1 mg via INTRAMUSCULAR
  Filled 2016-05-21: qty 1

## 2016-05-21 NOTE — ED Triage Notes (Signed)
Patient reports approximately 45 minutes ago she was eating fish and a piece of bone became lodged in her throat. Patient reports pain and difficulty swallowing own secretions. A&O x4. No vocal changes, speaking in complete sentences.

## 2016-05-21 NOTE — ED Provider Notes (Signed)
[facility]-EMERGENCY DEPT Provider Note   CSN: [id number] Arrival date & time: 05/21/16  1801     History   Chief Complaint No chief complaint on file.   [name] is a 50 y.o. female.  HPI Patient states she thinks she swallowed a fish bone that is lodged in her throat. This happened roughly 45 minutes prior to arrival. Pain is worse with swallowing. She's been able to eat several slices of bread. She tolerated this. No shortness of breath or chest pain. Past Medical History:  Diagnosis Date  . Anxiety   . Brain aneurysm   . Depression   . Grave's disease 2007   TSH (08/31/2010) = 0.186 (low), free T4 = 0.91 (WNL)  . Hypertension   . Postsurgical hypothyroidism   . Raynaud disease 2007    Patient Active Problem List   Diagnosis Date Noted  . Cocaine dependence (HCC) 07/25/2015  . 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/25/2015  . Hx of non anemic vitamin B12 deficiency 11/08/2014  . Brain aneurysm 09/09/2014  . Tobacco use disorder 07/22/2014  . Rhinitis, allergic 07/22/2014  . Breast pain, right 07/09/2014  . Chest pain, unspecified 07/02/2013  . Low back pain 01/24/2013  . Health care maintenance 10/16/2012  . Hot flashes 02/08/2012  . Post-surgical hypothyroidism 12/07/2011  . Fibromyalgia muscle pain 03/31/2011  . Migraine 03/31/2011  . Essential hypertension 11/14/2008  . DEPRESSION 08/10/2006    Past Surgical History:  Procedure Laterality Date  . ABDOMINAL HYSTERECTOMY    . COLONOSCOPY  02/14/2008   done by Dr. Christella HartiganJacobs - small colonic polyp identified, 6 mm in size and per biopsy --> tubular adenoma with no malignancy or high grade dysplasia noted  . TH[facility]OM[facility]  [facility]/08/2012   [facility]; history of Graves' disease with multinodular goiter  . WISDOM TOOTH EXTRACTION      OB History    Gravida Para Term Preterm AB Living   3 3 2 1  0 2   SAB TAB Ectopic Multiple Live Births   0               Home Medications    Prior to Admission  medications   Medication Sig Start Date End Date Taking? Authorizing Provider  amitriptyline (ELAVIL) 100 MG tablet Take 1 tablet (100 mg total) by mouth at bedtime. 07/26/15  Yes Thermon LeylandLaura A Davis, NP  cyclobenzaprine (FLEXERIL) 10 MG tablet Take 1 tablet (10 mg total) by mouth 2 (two) times daily as needed for muscle spasms. 03/22/16  Yes Hope Orlene OchM Neese, NP  diclofenac (VOLTAREN) 50 MG EC tablet Take 1 tablet (50 mg total) by mouth 2 (two) times daily. 03/22/16  Yes Hope Orlene OchM Neese, NP  gabapentin (NEURONTIN) 300 MG capsule Take 1 capsule (300 mg total) by mouth 2 (two) times daily. 07/26/15  Yes Thermon LeylandLaura A Davis, NP  hydrochlorothiazide (HYDRODIURIL) 12.5 MG tablet Take 12.5 mg by mouth daily.   Yes Historical Provider, MD  hydrOXYzine (ATARAX/VISTARIL) 25 MG tablet Take 1 tablet (25 mg total) by mouth every 6 (six) hours as needed for anxiety. 07/26/15  Yes Thermon LeylandLaura A Davis, NP  levothyroxine (SYNTHROID, LEVOTHROID) 125 MCG tablet Take 1 tablet (125 mcg total) by mouth daily before breakfast. 08/06/15  Yes Garlon HatchetLisa M Sanders, PA-C  metoprolol tartrate (LOPRESSOR) 25 MG tablet Take 1 tablet (25 mg total) by mouth 2 (two) times daily. 07/26/15  Yes Thermon LeylandLaura A Davis, NP  benzonatate (TESSALON) 100 MG capsule Take 2 capsules (200 mg total) by mouth  2 (two) times daily as needed for cough. Patient not taking: Reported on 05/21/2016 02/14/16   Barrett Henle, PA-C  cetirizine (ZYRTEC) 10 MG tablet Take 1 tablet (10 mg total) by mouth daily. Patient not taking: Reported on 05/21/2016 07/26/15   Thermon Leyland, NP  oxyCODONE-acetaminophen (PERCOCET/ROXICET) 5-325 MG tablet Take 2 tablets by mouth every 6 (six) hours as needed for severe pain. Patient not taking: Reported on 05/21/2016 03/19/16   Eyvonne Mechanic, PA-C  predniSONE (DELTASONE) 20 MG tablet Take 3 tablets (60 mg total) by mouth daily. 03/14/16   Audry Pili, PA-C    Family History Family History  Problem Relation Age of Onset  . Colon cancer Maternal Grandfather   .  Cancer Maternal Grandfather     prostate  . Hypertension Mother   . Thyroid disease Mother   . Lupus Mother   . Hypertension Father     Social History Social History  Substance Use Topics  . Smoking status: Current Every Day Smoker    Packs/day: 0.20    Years: 35.00    Types: Cigarettes    Last attempt to quit: 10/13/1996  . Smokeless tobacco: Never Used  . Alcohol use No     Allergies   Adhesive [tape]   Review of Systems Review of Systems  Constitutional: Negative for chills and fever.  HENT: Positive for sore throat and trouble swallowing. Negative for voice change.   Respiratory: Negative for shortness of breath and wheezing.   Gastrointestinal: Negative for abdominal pain, nausea and vomiting.  Musculoskeletal: Positive for neck pain.  All other systems reviewed and are negative.    Physical Exam Updated Vital Signs BP 118/79 (BP Location: Right Arm)   Pulse 73   Temp 98.3 F (36.8 C) (Oral)   Resp 18   Ht 5\' 3"  (1.6 m)   Wt 184 lb (83.5 kg)   LMP 01/26/2000   SpO2 100%   BMI 32.59 kg/m   Physical Exam  Constitutional: She is oriented to person, place, and time. She appears well-developed and well-nourished.  HENT:  Head: Normocephalic and atraumatic.  Mouth/Throat: Oropharynx is clear and moist. No oropharyngeal exudate.  No visualized foreign bodies in the posterior oropharynx. Tolerating secretions  Eyes: EOM are normal. Pupils are equal, round, and reactive to light.  Neck: Normal range of motion. Neck supple.  Cardiovascular: Normal rate and regular rhythm.  Exam reveals no friction rub.   No murmur heard. Pulmonary/Chest: Effort normal and breath sounds normal. No stridor. No respiratory distress. She has no wheezes. She exhibits no tenderness.  Abdominal: Soft. Bowel sounds are normal. There is no tenderness. There is no rebound and no guarding.  Musculoskeletal: Normal range of motion. She exhibits no edema or tenderness.  Neurological: She  is alert and oriented to person, place, and time.  Skin: Skin is warm and dry. Capillary refill takes less than 2 seconds. No rash noted. No erythema.  Psychiatric: She has a normal mood and affect. Her behavior is normal.  Nursing note and vitals reviewed.    ED Treatments / Results  Labs (all labs ordered are listed, but only abnormal results are displayed) Labs Reviewed - No data to display  EKG  EKG Interpretation None       Radiology Dg Neck Soft Tissue  Result Date: 05/21/2016 CLINICAL DATA:  Pt states she swallowed a fish bone about 2 hrs ago and feels like it is still stuck in throat. Pt c/o difficulty swallowing and pain to  center to right side of throat. EXAM: NECK SOFT TISSUES - 1+ VIEW COMPARISON:  None. FINDINGS: There is no evidence of retropharyngeal soft tissue swelling or epiglottic enlargement. The cervical airway is unremarkable and no radio-opaque foreign body identified. Incidental finding of cervical ribs. Degenerative changes in the cervical spine. Thyroid cartilage calcifications. IMPRESSION: Negative. Electronically Signed   By: Burman Nieves M.D.   On: 05/21/2016 20:12    Procedures Procedures (including critical care time)  Medications Ordered in ED Medications  glucagon (human recombinant) (GLUCAGEN) injection 1 mg (1 mg Intramuscular Given 05/21/16 2004)     Initial Impression / Assessment and Plan / ED Course  I have reviewed the triage vital signs and the nursing notes.  Pertinent labs & imaging results that were available during my care of the patient were reviewed by me and considered in my medical decision making (see chart for details).  Clinical Course      Final Clinical Impressions(s) / ED Diagnoses   Final diagnoses:  Esophageal foreign body, initial encounter  Patient states for body sensation has resolved. States she still feels some irritation. She is drinking without any difficulty. We'll discharge home. Advised to follow-up  with GI for ongoing symptoms. Return precautions given.  New Prescriptions New Prescriptions   No medications on file     Loren Racer, MD 05/21/16 2143

## 2016-05-21 NOTE — ED Notes (Signed)
Patient d/c'd self care.  F/U reviewed.  Patient verbalized understanding. 

## 2016-05-21 NOTE — ED Notes (Addendum)
Patient c/o pain in neck and ears that has gotten worse, notified EDP.

## 2016-07-14 ENCOUNTER — Emergency Department (HOSPITAL_COMMUNITY)
Admission: EM | Admit: 2016-07-14 | Discharge: 2016-07-14 | Disposition: A | Discharge status: Home / Self Care | Payer: Self-pay | Attending: Emergency Medicine | Admitting: Emergency Medicine

## 2016-07-14 ENCOUNTER — Encounter (HOSPITAL_COMMUNITY): Payer: Self-pay

## 2016-07-14 DIAGNOSIS — M5410 Radiculopathy, site unspecified: Secondary | ICD-10-CM

## 2016-07-14 DIAGNOSIS — F17210 Nicotine dependence, cigarettes, uncomplicated: Secondary | ICD-10-CM | POA: Insufficient documentation

## 2016-07-14 DIAGNOSIS — I10 Essential (primary) hypertension: Secondary | ICD-10-CM | POA: Insufficient documentation

## 2016-07-14 DIAGNOSIS — M5416 Radiculopathy, lumbar region: Secondary | ICD-10-CM | POA: Insufficient documentation

## 2016-07-14 LAB — COMPREHENSIVE METABOLIC PANEL
ALBUMIN: 3.8 g/dL (ref 3.5–5.0)
ALK PHOS: 92 U/L (ref 38–126)
ALT: 21 U/L (ref 14–54)
AST: 26 U/L (ref 15–41)
Anion gap: 9 (ref 5–15)
BILIRUBIN TOTAL: 0.3 mg/dL (ref 0.3–1.2)
BUN: 10 mg/dL (ref 6–20)
CO2: 29 mmol/L (ref 22–32)
CREATININE: 0.69 mg/dL (ref 0.44–1.00)
Calcium: 9.6 mg/dL (ref 8.9–10.3)
Chloride: 103 mmol/L (ref 101–111)
GFR calc Af Amer: >60 mL/min (ref >60)
GLUCOSE: 74 mg/dL (ref 65–99)
Potassium: 3.6 mmol/L (ref 3.5–5.1)
Sodium: 141 mmol/L (ref 135–145)
TOTAL PROTEIN: 7 g/dL (ref 6.5–8.1)

## 2016-07-14 LAB — CBC
HCT: 41.5 % (ref 36.0–46.0)
Hemoglobin: 13.7 g/dL (ref 12.0–15.0)
MCH: 29 pg (ref 26.0–34.0)
MCHC: 33 g/dL (ref 30.0–36.0)
MCV: 87.9 fL (ref 78.0–100.0)
PLATELETS: 208 10*3/uL (ref 150–400)
RBC: 4.72 MIL/uL (ref 3.87–5.11)
RDW: 12.5 % (ref 11.5–15.5)
WBC: 6.3 10*3/uL (ref 4.0–10.5)

## 2016-07-14 LAB — URINALYSIS, ROUTINE W REFLEX MICROSCOPIC
BILIRUBIN URINE: NEGATIVE
GLUCOSE, UA: NEGATIVE mg/dL
Hgb urine dipstick: NEGATIVE
KETONES UR: NEGATIVE mg/dL
LEUKOCYTES UA: NEGATIVE
NITRITE: NEGATIVE
PH: 6.5 (ref 5.0–8.0)
PROTEIN: NEGATIVE mg/dL
Specific Gravity, Urine: 1.027 (ref 1.005–1.030)

## 2016-07-14 LAB — LIPASE, BLOOD: Lipase: 33 U/L (ref 11–51)

## 2016-07-14 MED ORDER — PREDNISONE 20 MG PO TABS: ORAL_TABLET | ORAL | 0 refills | Status: DC

## 2016-07-14 MED ORDER — METHOCARBAMOL 500 MG PO TABS: 500.0000 mg | ORAL_TABLET | Freq: Two times a day (BID) | ORAL | 0 refills | Status: DC

## 2016-07-14 MED ORDER — IBUPROFEN 800 MG PO TABS
800.0000 mg | ORAL_TABLET | Freq: Once | ORAL | Status: AC
  Administered 2016-07-14: 800 mg via ORAL
  Filled 2016-07-14: qty 1

## 2016-07-14 NOTE — ED Provider Notes (Signed)
[facility]-EMERGENCY DEPT Provider Note   CSN: [id number] Arrival date & time: 07/14/16  1159     History   Chief Complaint Chief Complaint  Patient presents with  . Back Pain  . Leg Pain    [name] is a 50 y.o. female.  HPI 50 year old female presenting with complaining of low back and leg pain. For the past several months she has had recurrent pain to her low back that radiates towards the left hip and left leg. Pain is described as an achy and soreness sensation, sitting makes it worse, and mild improvement with using ice, heat or Aleve. Her low back pain radiates across her back. She has new pain to her left hip from her left gluteus to left groin which has been worsened in the past week. She denies having fever, chills, abdominal pain, bowel bladder incontinence, saddle anesthesia, dysuria, hematuria. Patient states that her PCP is currently setting her up for an MRI. Patient currently taking gabapentin at home with minimal relief. She denies any history of IV drug use or active cancer. Denies any heavy lifting or recent injury.  Past Medical History:  Diagnosis Date  . Anxiety   . Brain aneurysm   . Depression   . Grave's disease 2007   TSH (08/31/2010) = 0.186 (low), free T4 = 0.91 (WNL)  . Hypertension   . Postsurgical hypothyroidism   . Raynaud disease 2007    Patient Active Problem List   Diagnosis Date Noted  . Cocaine dependence (HCC) 07/25/2015  . 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/25/2015  . Hx of non anemic vitamin B12 deficiency 11/08/2014  . Brain aneurysm 09/09/2014  . Tobacco use disorder 07/22/2014  . Rhinitis, allergic 07/22/2014  . Breast pain, right 07/09/2014  . Chest pain, unspecified 07/02/2013  . Low back pain 01/24/2013  . Health care maintenance 10/16/2012  . Hot flashes 02/08/2012  . Post-surgical hypothyroidism 12/07/2011  . Fibromyalgia muscle pain 03/31/2011  . Migraine 03/31/2011  . Essential hypertension 11/14/2008  .  DEPRESSION 08/10/2006    Past Surgical History:  Procedure Laterality Date  . ABDOMINAL HYSTERECTOMY    . COLONOSCOPY  02/14/2008   done by Dr. Christella HartiganJacobs - small colonic polyp identified, 6 mm in size and per biopsy --> tubular adenoma with no malignancy or high grade dysplasia noted  . [facility]  [facility]/08/2012   [facility]; history of Graves' disease with multinodular goiter  . WISDOM TOOTH EXTRACTION      OB History    Gravida Para Term Preterm AB Living   3 3 2 1  0 2   SAB TAB Ectopic Multiple Live Births   0               Home Medications    Prior to Admission medications   Medication Sig Start Date End Date Taking? Authorizing Provider  amitriptyline (ELAVIL) 100 MG tablet Take 1 tablet (100 mg total) by mouth at bedtime. 07/26/15  Yes Thermon LeylandLaura A Davis, NP  cyclobenzaprine (FLEXERIL) 10 MG tablet Take 1 tablet (10 mg total) by mouth 2 (two) times daily as needed for muscle spasms. 03/22/16  Yes Hope Orlene OchM Neese, NP  diclofenac (VOLTAREN) 50 MG EC tablet Take 1 tablet (50 mg total) by mouth 2 (two) times daily. 03/22/16  Yes Hope Orlene OchM Neese, NP  gabapentin (NEURONTIN) 300 MG capsule Take 1 capsule (300 mg total) by mouth 2 (two) times daily. 07/26/15  Yes Thermon LeylandLaura A Davis, NP  hydrochlorothiazide (HYDRODIURIL) 12.5 MG tablet Take  12.5 mg by mouth daily.   Yes Historical Provider, MD  hydrOXYzine (ATARAX/VISTARIL) 25 MG tablet Take 1 tablet (25 mg total) by mouth every 6 (six) hours as needed for anxiety. 07/26/15  Yes Thermon Leyland, NP  levothyroxine (SYNTHROID, LEVOTHROID) 125 MCG tablet Take 1 tablet (125 mcg total) by mouth daily before breakfast. 08/06/15  Yes Garlon Hatchet, PA-C  metoprolol tartrate (LOPRESSOR) 25 MG tablet Take 1 tablet (25 mg total) by mouth 2 (two) times daily. 07/26/15  Yes Thermon Leyland, NP  cetirizine (ZYRTEC) 10 MG tablet Take 1 tablet (10 mg total) by mouth daily. Patient not taking: Reported on 05/21/2016 07/26/15   Thermon Leyland, NP  oxyCODONE-acetaminophen  (PERCOCET/ROXICET) 5-325 MG tablet Take 2 tablets by mouth every 6 (six) hours as needed for severe pain. Patient not taking: Reported on 05/21/2016 03/19/16   Eyvonne Mechanic, PA-C    Family History Family History  Problem Relation Age of Onset  . Colon cancer Maternal Grandfather   . Cancer Maternal Grandfather     prostate  . Hypertension Mother   . Thyroid disease Mother   . Lupus Mother   . Hypertension Father     Social History Social History  Substance Use Topics  . Smoking status: Current Every Day Smoker    Packs/day: 0.20    Years: 35.00    Types: Cigarettes    Last attempt to quit: 10/13/1996  . Smokeless tobacco: Never Used  . Alcohol use No     Allergies   Adhesive [tape]   Review of Systems Review of Systems  All other systems reviewed and are negative.    Physical Exam Updated Vital Signs BP 157/99   Pulse 87   Temp 98 F (36.7 C) (Oral)   Resp 16   Ht 5\' 4"  (1.626 m)   Wt 81.6 kg   LMP 01/26/2000   SpO2 98%   BMI 30.90 kg/m   Physical Exam  Constitutional: She appears well-developed and well-nourished. No distress.  HENT:  Head: Atraumatic.  Eyes: Conjunctivae are normal.  Neck: Neck supple.  Cardiovascular: Normal rate, regular rhythm and intact distal pulses.   Pulmonary/Chest: Effort normal and breath sounds normal.  Abdominal: Soft. Bowel sounds are normal. She exhibits no distension. There is no tenderness.  Genitourinary:  Genitourinary Comments: Chaperone present during exam. Normal rectal tone  Musculoskeletal: She exhibits tenderness (Tenderness to lumbar paraspinal muscle on palpation without any overlying skin changes.). She exhibits no edema.  Neurological: She is alert. She has normal reflexes.  Positive left leg straight leg raise. 5/5 strength to bilateral lower extremities no foot drops.  Skin: No rash noted.  Psychiatric: She has a normal mood and affect.  Nursing note and vitals reviewed.    ED Treatments / Results   Labs (all labs ordered are listed, but only abnormal results are displayed) Labs Reviewed  URINALYSIS, ROUTINE W REFLEX MICROSCOPIC (NOT AT [facility]) - Abnormal; Notable for the following:       Result Value   Color, Urine AMBER (*)    APPearance CLOUDY (*)    All other components within normal limits  LIPASE, BLOOD  COMPREHENSIVE METABOLIC PANEL  CBC    EKG  EKG Interpretation None       Radiology No results found.  Procedures Procedures (including critical care time)  Medications Ordered in ED Medications - No data to display   Initial Impression / Assessment and Plan / ED Course  I have reviewed the triage  vital signs and the nursing notes.  Pertinent labs & imaging results that were available during my care of the patient were reviewed by me and considered in my medical decision making (see chart for details).  Clinical Course   BP 157/99   Pulse 87   Temp 98 F (36.7 C) (Oral)   Resp 16   Ht 5\' 4"  (1.626 m)   Wt 81.6 kg   LMP 01/26/2000   SpO2 98%   BMI 30.90 kg/m    Final Clinical Impressions(s) / ED Diagnoses   Final diagnoses:  Radicular low back pain    New Prescriptions New Prescriptions   METHOCARBAMOL (ROBAXIN) 500 MG TABLET    Take 1 tablet (500 mg total) by mouth 2 (two) times daily.   PREDNISONE (DELTASONE) 20 MG TABLET    2 tabs po daily x 4 days   2:58 PM Patient here with chronic low back pain with radicular pain to her left leg. No red flags. She is able to ambulate. She requesting for an MRI however at this time she does not qualify for emergent MRI. I will provide symptomatic treatment and encouraged patient to follow-up with her primary care provider for further care. Return precaution discussed.   Fayrene Helper, PA-C 07/14/16 1502    Benjiman Core, MD 07/14/16 [phone]

## 2016-07-14 NOTE — ED Triage Notes (Addendum)
Pt reports pain in her lower back that radiates into her legs and pelvic region. Pt reports she has been dx with sciatica previously but reports pain has moved to the left side and she now has numbness. Pt denies dysuria. Pt reports she is supposed to have an MRI.

## 2016-07-19 ENCOUNTER — Emergency Department (HOSPITAL_COMMUNITY)
Admission: EM | Admit: 2016-07-19 | Discharge: 2016-07-19 | Disposition: A | Discharge status: Home / Self Care | Payer: Self-pay | Attending: Emergency Medicine | Admitting: Emergency Medicine

## 2016-07-19 ENCOUNTER — Encounter (HOSPITAL_COMMUNITY): Payer: Self-pay | Admitting: *Deleted

## 2016-07-19 DIAGNOSIS — F17210 Nicotine dependence, cigarettes, uncomplicated: Secondary | ICD-10-CM | POA: Insufficient documentation

## 2016-07-19 DIAGNOSIS — I10 Essential (primary) hypertension: Secondary | ICD-10-CM | POA: Insufficient documentation

## 2016-07-19 DIAGNOSIS — Z79899 Other long term (current) drug therapy: Secondary | ICD-10-CM | POA: Insufficient documentation

## 2016-07-19 DIAGNOSIS — M5432 Sciatica, left side: Secondary | ICD-10-CM

## 2016-07-19 MED ORDER — KETOROLAC TROMETHAMINE 60 MG/2ML IM SOLN
60.0000 mg | Freq: Once | INTRAMUSCULAR | Status: AC
  Administered 2016-07-19: 60 mg via INTRAMUSCULAR
  Filled 2016-07-19: qty 2

## 2016-07-19 NOTE — Discharge Instructions (Signed)
Continue home muscle relaxers for pain and sciatica. You will definitely need an MRI to further evaluate your back pain. However, this should be done as an outpatient and ordered by your primary care doctor. Please follow up with her primary care doctor for reevaluation and ongoing management of your chronic back pain. Return to the emergency department if you experience true weakness in an extremity, loss of control of your bowels or bladder, fever.

## 2016-07-19 NOTE — ED Triage Notes (Signed)
Pt states she bent down on Sunday and felt a rip to L hip.  Difficult to walk d/t pain.

## 2016-07-19 NOTE — ED Notes (Signed)
Declined W/C at D/C and was escorted to lobby by RN. 

## 2016-07-19 NOTE — ED Provider Notes (Signed)
[facility]EMERGENCY DEPT Provider Note   CSN: [id number] Arrival date & time: 07/19/16  0825  By signing my name below, I, Sonum Patel, attest that this documentation has been prepared under the direction and in the presence of News CorporationSamantha Dowless PA-C. Electronically Signed: Sonum Patel, Neurosurgeoncribe. 07/19/16. 10:13 AM.  History   Chief Complaint Chief Complaint  Patient presents with  . Leg Pain    The history is provided by the patient. No language interpreter was used.     HPI Comments: Amy [name] is a 50 y.o. female who presents to the Emergency Department complaining of a few months of intermittent right lower back and buttocks pain with radiation down the lower right leg. She states she was diagnosed with sciatica and has taken prednisone and tramadol in the past. She reports speaking with her PCP last week and discussed getting imaging of the affected areas but ultimately did not get MRI scheduled. She also states she bent down last night when she felt a burning sensation to her left hip. She states this pain has been constant since last night but feels similar to pain she has been experiencing for the last year. She denies bowel/bladder incontinence. She reports a history of drug abuse and states she is not seeking any narcotic medications today. Pt is requesting MRI to be done today and she just wants to know what the issue is. Pt currently has the orange card but will be obtaining new insurance on the 1st of Nov.   Past Medical History:  Diagnosis Date  . Anxiety   . Brain aneurysm   . Depression   . Grave's disease 2007   TSH (08/31/2010) = 0.186 (low), free T4 = 0.91 (WNL)  . Hypertension   . Postsurgical hypothyroidism   . Raynaud disease 2007    Patient Active Problem List   Diagnosis Date Noted  . Cocaine dependence (HCC) 07/25/2015  . 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/25/2015  . Hx of non anemic vitamin B12 deficiency 11/08/2014  . Brain aneurysm 09/09/2014  .  Tobacco use disorder 07/22/2014  . Rhinitis, allergic 07/22/2014  . Breast pain, right 07/09/2014  . Chest pain, unspecified 07/02/2013  . Low back pain 01/24/2013  . Health care maintenance 10/16/2012  . Hot flashes 02/08/2012  . Post-surgical hypothyroidism 12/07/2011  . Fibromyalgia muscle pain 03/31/2011  . Migraine 03/31/2011  . Essential hypertension 11/14/2008  . DEPRESSION 08/10/2006    Past Surgical History:  Procedure Laterality Date  . ABDOMINAL HYSTERECTOMY    . COLONOSCOPY  02/14/2008   done by Dr. Christella HartiganJacobs - small colonic polyp identified, 6 mm in size and per biopsy --> tubular adenoma with no malignancy or high grade dysplasia noted  . [facility]; history of Graves' disease with multinodular goiter  . WISDOM TOOTH EXTRACTION      OB History    Gravida Para Term Preterm AB Living   3 3 2 1  0 2   SAB TAB Ectopic Multiple Live Births   0               Home Medications    Prior to Admission medications   Medication Sig Start Date End Date Taking? Authorizing Provider  amitriptyline (ELAVIL) 100 MG tablet Take 1 tablet (100 mg total) by mouth at bedtime. 07/26/15   Thermon LeylandLaura A Davis, NP  cetirizine (ZYRTEC) 10 MG tablet Take 1 tablet (10 mg total) by mouth daily. Patient not taking: Reported on 05/21/2016 07/26/15  Thermon LeylandLaura A Davis, NP  cyclobenzaprine (FLEXERIL) 10 MG tablet Take 1 tablet (10 mg total) by mouth 2 (two) times daily as needed for muscle spasms. 03/22/16   Hope Orlene OchM Neese, NP  diclofenac (VOLTAREN) 50 MG EC tablet Take 1 tablet (50 mg total) by mouth 2 (two) times daily. 03/22/16   Hope Orlene OchM Neese, NP  gabapentin (NEURONTIN) 300 MG capsule Take 1 capsule (300 mg total) by mouth 2 (two) times daily. 07/26/15   Thermon LeylandLaura A Davis, NP  hydrochlorothiazide (HYDRODIURIL) 12.5 MG tablet Take 12.5 mg by mouth daily.    Historical Provider, MD  hydrOXYzine (ATARAX/VISTARIL) 25 MG tablet Take 1 tablet (25 mg total) by mouth every 6 (six) hours as  needed for anxiety. 07/26/15   Thermon LeylandLaura A Davis, NP  levothyroxine (SYNTHROID, LEVOTHROID) 125 MCG tablet Take 1 tablet (125 mcg total) by mouth daily before breakfast. 08/06/15   Garlon HatchetLisa M Sanders, PA-C  methocarbamol (ROBAXIN) 500 MG tablet Take 1 tablet (500 mg total) by mouth 2 (two) times daily. 07/14/16   Fayrene HelperBowie Tran, PA-C  metoprolol tartrate (LOPRESSOR) 25 MG tablet Take 1 tablet (25 mg total) by mouth 2 (two) times daily. 07/26/15   Thermon LeylandLaura A Davis, NP  oxyCODONE-acetaminophen (PERCOCET/ROXICET) 5-325 MG tablet Take 2 tablets by mouth every 6 (six) hours as needed for severe pain. Patient not taking: Reported on 05/21/2016 03/19/16   Eyvonne MechanicJeffrey Hedges, PA-C  predniSONE (DELTASONE) 20 MG tablet 2 tabs po daily x 4 days 07/14/16   Fayrene HelperBowie Tran, PA-C    Family History Family History  Problem Relation Age of Onset  . Colon cancer Maternal Grandfather   . Cancer Maternal Grandfather     prostate  . Hypertension Mother   . Thyroid disease Mother   . Lupus Mother   . Hypertension Father     Social History Social History  Substance Use Topics  . Smoking status: Current Every Day Smoker    Packs/day: 0.20    Years: 35.00    Types: Cigarettes    Last attempt to quit: 10/13/1996  . Smokeless tobacco: Never Used  . Alcohol use No     Allergies   Adhesive [tape]   Review of Systems Review of Systems  Musculoskeletal: Positive for back pain and myalgias.  Neurological: Negative for weakness and numbness.     Physical Exam Updated Vital Signs BP 169/93 (BP Location: Left Arm)   Pulse 87   Temp 98.3 F (36.8 C) (Oral)   Resp 18   Ht 5\' 5"  (1.651 m)   Wt 182 lb (82.6 kg)   LMP 01/26/2000   SpO2 100%   BMI 30.29 kg/m   Physical Exam  Constitutional: She is oriented to person, place, and time. She appears well-developed and well-nourished. No distress.  HENT:  Head: Normocephalic and atraumatic.  Eyes: Conjunctivae are normal. Right eye exhibits no discharge. Left eye exhibits no  discharge. No scleral icterus.  Cardiovascular: Normal rate.   Pulmonary/Chest: Effort normal.  Musculoskeletal:  No midline spinal TTP. FROM of C, T, L spine. No step offs or obvious bony deformities. Negative SLR.    Neurological: She is alert and oriented to person, place, and time. Coordination normal.  Strength 5/5 throughout. No sensory deficits. No gait abnormality.   Skin: Skin is warm and dry. No rash noted. She is not diaphoretic. No erythema. No pallor.  Psychiatric: She has a normal mood and affect. Her behavior is normal.  Nursing note and vitals reviewed.    ED Treatments /  Results  DIAGNOSTIC STUDIES: Oxygen Saturation is 100% on RA, normal by my interpretation.    COORDINATION OF CARE: 10:12 AM Discussed treatment plan with pt at bedside and pt agreed to plan.    Labs (all labs ordered are listed, but only abnormal results are displayed) Labs Reviewed - No data to display  EKG  EKG Interpretation None       Radiology No results found.  Procedures Procedures (including critical care time)  Medications Ordered in ED Medications  ketorolac (TORADOL) injection 60 mg (not administered)     Initial Impression / Assessment and Plan / ED Course  I have reviewed the triage vital signs and the nursing notes.  Pertinent labs & imaging results that were available during my care of the patient were reviewed by me and considered in my medical decision making (see chart for details).  Clinical Course    Patient presents to the ED c/o back pain that has been present for over 1 year.  No neurological deficits and normal neuro exam.  Patient is ambulatory.  No loss of bowel or bladder control.  No concern for cauda equina.  No fever, night sweats, weight loss, h/o cancer, IVDA, no recent procedure to back. No urinary symptoms suggestive of UTI.  Pt is requesting MRI to be done but do not feel that this is needed on an emergent basis. Instructed pt to follow up with  PCP to have outpt MRI obtained. Pt states that she is getting official insurance on the 1st of November so this should help her financially. Pt does not request any narcotics as she is a recovering addict. She was given toradol IM in the ED with symptomatic relief. Supportive care and return precaution discussed. Appears safe for discharge at this time. Follow up as indicated in discharge paperwork.    Final Clinical Impressions(s) / ED Diagnoses   Final diagnoses:  Sciatica of left side    New Prescriptions Discharge Medication List as of 07/19/2016 10:34 AM     I personally performed the services described in this documentation, which was scribed in my presence. The recorded information has been reviewed and is accurate.      Lester Kinsman [place], PA-C 07/21/16 2217    Pricilla Loveless, MD 07/28/16 [phone]

## 2016-10-23 ENCOUNTER — Encounter (HOSPITAL_COMMUNITY): Payer: Self-pay

## 2016-10-23 ENCOUNTER — Emergency Department (HOSPITAL_COMMUNITY)
Admission: EM | Admit: 2016-10-23 | Discharge: 2016-10-23 | Disposition: A | Discharge status: Home / Self Care | Payer: BLUE CROSS/BLUE SHIELD | Attending: Emergency Medicine | Admitting: Emergency Medicine

## 2016-10-23 DIAGNOSIS — I10 Essential (primary) hypertension: Secondary | ICD-10-CM | POA: Insufficient documentation

## 2016-10-23 DIAGNOSIS — E039 Hypothyroidism, unspecified: Secondary | ICD-10-CM | POA: Diagnosis not present

## 2016-10-23 DIAGNOSIS — F17210 Nicotine dependence, cigarettes, uncomplicated: Secondary | ICD-10-CM | POA: Diagnosis not present

## 2016-10-23 DIAGNOSIS — Z79899 Other long term (current) drug therapy: Secondary | ICD-10-CM | POA: Diagnosis not present

## 2016-10-23 DIAGNOSIS — M545 Low back pain: Secondary | ICD-10-CM | POA: Diagnosis not present

## 2016-10-23 DIAGNOSIS — M5442 Lumbago with sciatica, left side: Secondary | ICD-10-CM | POA: Diagnosis not present

## 2016-10-23 MED ORDER — METHOCARBAMOL 500 MG PO TABS: 500.0000 mg | ORAL_TABLET | Freq: Two times a day (BID) | ORAL | 0 refills | Status: DC | PRN

## 2016-10-23 MED ORDER — NAPROXEN 500 MG PO TABS: 500.0000 mg | ORAL_TABLET | Freq: Two times a day (BID) | ORAL | 0 refills | Status: DC

## 2016-10-23 NOTE — ED Triage Notes (Signed)
Onset 7 days ago left lower back pain radiating to left groin, left hip and let buttock.  Did not take any medications.

## 2016-10-23 NOTE — ED Provider Notes (Signed)
[facility]PT Provider Note   CSN: [id number] Arrival date & time: 10/23/16  1804     History   Chief Complaint Chief Complaint  Patient presents with  . Back Pain    [name] is a 51 y.o. female.  Amy [name] is a 51 y.o. Female who presents to the ED complaining of left low back pain that radiates down her posterior leg for the past 5 days. She reports a history of sciatica and that this feels similar, but on a different side. She tells me her pain is worse with movement and with some ambulation her symptoms improved slightly. She's taken Tylenol with little relief today. She denies any injury or trauma to her back. She denies history of cancer or IV drug use. No rapid changes to her weight. She denies loss of bowel or bladder control. She denies fevers, abdominal pain, nausea, vomiting, urinary symptoms, trouble urinating, rashes, falls, numbness, weakness, rashes, lightheadedness, dizziness or other complaints.   The history is provided by the patient. No language interpreter was used.  Back Pain   Pertinent negatives include no chest pain, no fever, no numbness, no headaches, no abdominal pain, no dysuria and no weakness.    Past Medical History:  Diagnosis Date  . Anxiety   . Brain aneurysm   . Depression   . Grave's disease 2007   TSH (08/31/2010) = 0.186 (low), free T4 = 0.91 (WNL)  . Hypertension   . Postsurgical hypothyroidism   . Raynaud disease 2007    Patient Active Problem List   Diagnosis Date Noted  . Cocaine dependence (HCC) 07/25/2015  . 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/25/2015  . Hx of non anemic vitamin B12 deficiency 11/08/2014  . Brain aneurysm 09/09/2014  . Tobacco use disorder 07/22/2014  . Rhinitis, allergic 07/22/2014  . Breast pain, right 07/09/2014  . Chest pain, unspecified 07/02/2013  . Low back pain 01/24/2013  . Health care maintenance 10/16/2012  . Hot flashes 02/08/2012  . Post-surgical hypothyroidism  12/07/2011  . Fibromyalgia muscle pain 03/31/2011  . Migraine 03/31/2011  . Essential hypertension 11/14/2008  . DEPRESSION 08/10/2006    Past Surgical History:  Procedure Laterality Date  . ABDOMINAL HYSTERECTOMY    . COLONOSCOPY  02/14/2008   done by Dr. Christella Hartigan - small colonic polyp identified, 6 mm in size and per biopsy --> tubular adenoma with no malignancy or high grade dysplasia noted  . THYROIDECTOMY  03/08/2012   [facility]; history of Graves' disease with multinodular goiter  . WISDOM TOOTH EXTRACTION      OB History    Gravida Para Term Preterm AB Living   3 3 2 1  0 2   SAB TAB Ectopic Multiple Live Births   0               Home Medications    Prior to Admission medications   Medication Sig Start Date End Date Taking? Authorizing Provider  amitriptyline (ELAVIL) 100 MG tablet Take 1 tablet (100 mg total) by mouth at bedtime. 07/26/15   Thermon Leyland, NP  cetirizine (ZYRTEC) 10 MG tablet Take 1 tablet (10 mg total) by mouth daily. Patient not taking: Reported on 05/21/2016 07/26/15   Thermon Leyland, NP  cyclobenzaprine (FLEXERIL) 10 MG tablet Take 1 tablet (10 mg total) by mouth 2 (two) times daily as needed for muscle spasms. 03/22/16   Hope Orlene Och, NP  diclofenac (VOLTAREN) 50 MG EC tablet Take 1 tablet (50 mg  total) by mouth 2 (two) times daily. 03/22/16   Hope Orlene Och, NP  gabapentin (NEURONTIN) 300 MG capsule Take 1 capsule (300 mg total) by mouth 2 (two) times daily. 07/26/15   Thermon Leyland, NP  hydrochlorothiazide (HYDRODIURIL) 12.5 MG tablet Take 12.5 mg by mouth daily.    Historical Provider, MD  hydrOXYzine (ATARAX/VISTARIL) 25 MG tablet Take 1 tablet (25 mg total) by mouth every 6 (six) hours as needed for anxiety. 07/26/15   Thermon Leyland, NP  levothyroxine (SYNTHROID, LEVOTHROID) 125 MCG tablet Take 1 tablet (125 mcg total) by mouth daily before breakfast. 08/06/15   Garlon Hatchet, PA-C  methocarbamol (ROBAXIN) 500 MG tablet Take 1 tablet (500 mg total) by  mouth 2 (two) times daily as needed for muscle spasms. 10/23/16   Everlene Farrier, PA-C  metoprolol tartrate (LOPRESSOR) 25 MG tablet Take 1 tablet (25 mg total) by mouth 2 (two) times daily. 07/26/15   Thermon Leyland, NP  naproxen (NAPROSYN) 500 MG tablet Take 1 tablet (500 mg total) by mouth 2 (two) times daily with a meal. 10/23/16   Everlene Farrier, PA-C  oxyCODONE-acetaminophen (PERCOCET/ROXICET) 5-325 MG tablet Take 2 tablets by mouth every 6 (six) hours as needed for severe pain. Patient not taking: Reported on 05/21/2016 03/19/16   Eyvonne Mechanic, PA-C  predniSONE (DELTASONE) 20 MG tablet 2 tabs po daily x 4 days 07/14/16   Fayrene Helper, PA-C    Family History Family History  Problem Relation Age of Onset  . Colon cancer Maternal Grandfather   . Cancer Maternal Grandfather     prostate  . Hypertension Mother   . Thyroid disease Mother   . Lupus Mother   . Hypertension Father     Social History Social History  Substance Use Topics  . Smoking status: Current Every Day Smoker    Packs/day: 0.20    Years: 35.00    Types: Cigarettes    Last attempt to quit: 10/13/1996  . Smokeless tobacco: Never Used  . Alcohol use No     Allergies   Adhesive [tape]   Review of Systems Review of Systems  Constitutional: Negative for fever.  HENT: Negative for sore throat.   Eyes: Negative for visual disturbance.  Respiratory: Negative for cough and shortness of breath.   Cardiovascular: Negative for chest pain.  Gastrointestinal: Negative for abdominal pain, diarrhea, nausea and vomiting.  Genitourinary: Negative for difficulty urinating, dysuria, flank pain, frequency, hematuria and urgency.  Musculoskeletal: Positive for back pain. Negative for neck pain.  Skin: Negative for rash.  Neurological: Negative for syncope, weakness, light-headedness, numbness and headaches.     Physical Exam Updated Vital Signs BP 108/72 (BP Location: Right Arm)   Pulse 81   Temp 98.5 F (36.9 C) (Oral)    Resp 20   LMP 01/26/2000   SpO2 100%   Physical Exam  Constitutional: She appears well-developed and well-nourished. No distress.  Nontoxic appearing.  HENT:  Head: Normocephalic and atraumatic.  Eyes: Right eye exhibits no discharge. Left eye exhibits no discharge.  Neck: Neck supple.  Cardiovascular: Normal rate, regular rhythm, normal heart sounds and intact distal pulses.  Exam reveals no gallop and no friction rub.   No murmur heard. Pulmonary/Chest: Effort normal and breath sounds normal. No respiratory distress. She has no wheezes. She has no rales.  Abdominal: Soft. She exhibits no mass. There is no tenderness. There is no guarding.  Abdomen is soft and nontender to palpation.  Musculoskeletal: Normal range of  motion. She exhibits tenderness. She exhibits no edema or deformity.  Tenderness to her left low back musculature that extends into her left posterior buttocks. No overlying skin changes to her back. No midline back tenderness. No crepitus. Good strength to her bilateral lower extremities. No calf edema or tenderness bilaterally.  Neurological: She is alert. She displays normal reflexes. No sensory deficit. Coordination normal.  Normal gait. Bilateral patellar DTRs are intact. Sensation is intact to her bilateral lower extremities.  Skin: Skin is warm and dry. Capillary refill takes less than 2 seconds. No rash noted. She is not diaphoretic. No erythema. No pallor.  Psychiatric: She has a normal mood and affect. Her behavior is normal.  Nursing note and vitals reviewed.    ED Treatments / Results  Labs (all labs ordered are listed, but only abnormal results are displayed) Labs Reviewed - No data to display  EKG  EKG Interpretation None       Radiology No results found.  Procedures Procedures (including critical care time)  Medications Ordered in ED Medications - No data to display   Initial Impression / Assessment and Plan / ED Course  I have reviewed  the triage vital signs and the nursing notes.  Pertinent labs & imaging results that were available during my care of the patient were reviewed by me and considered in my medical decision making (see chart for details).    This  is a 51 y.o. Female who presents to the ED complaining of left low back pain that radiates down her posterior leg for the past 5 days. She reports a history of sciatica and that this feels similar, but on a different side. Patient with back pain.  No neurological deficits and normal neuro exam.  Patient can walk with normal gait.  No loss of bowel or bladder control.  No concern for cauda equina.  No fever, night sweats, weight loss, h/o cancer, IVDU.  RICE protocol and pain medicine indicated and discussed with patient.  I advised the patient to follow-up with their primary care provider this week. I advised the patient to return to the emergency department with new or worsening symptoms or new concerns. The patient verbalized understanding and agreement with plan.     Final Clinical Impressions(s) / ED Diagnoses   Final diagnoses:  Acute left-sided low back pain with left-sided sciatica    New Prescriptions New Prescriptions   METHOCARBAMOL (ROBAXIN) 500 MG TABLET    Take 1 tablet (500 mg total) by mouth 2 (two) times daily as needed for muscle spasms.   NAPROXEN (NAPROSYN) 500 MG TABLET    Take 1 tablet (500 mg total) by mouth 2 (two) times daily with a meal.     Everlene FarrierWilliam Lawrie Tunks, PA-C 10/23/16 2053    Tilden FossaElizabeth Rees, MD 10/24/16 1248

## 2016-10-26 ENCOUNTER — Other Ambulatory Visit: Payer: Self-pay | Admitting: Family Medicine

## 2016-10-26 ENCOUNTER — Other Ambulatory Visit: Payer: Self-pay

## 2016-10-26 DIAGNOSIS — R921 Mammographic calcification found on diagnostic imaging of breast: Secondary | ICD-10-CM

## 2016-12-07 DIAGNOSIS — M545 Low back pain: Secondary | ICD-10-CM | POA: Diagnosis not present

## 2016-12-07 DIAGNOSIS — Z79891 Long term (current) use of opiate analgesic: Secondary | ICD-10-CM | POA: Diagnosis not present

## 2016-12-07 DIAGNOSIS — I10 Essential (primary) hypertension: Secondary | ICD-10-CM | POA: Diagnosis not present

## 2016-12-24 ENCOUNTER — Encounter (HOSPITAL_COMMUNITY): Payer: Self-pay | Admitting: *Deleted

## 2017-01-18 ENCOUNTER — Ambulatory Visit (INDEPENDENT_AMBULATORY_CARE_PROVIDER_SITE_OTHER): Payer: Self-pay

## 2017-01-18 ENCOUNTER — Ambulatory Visit (INDEPENDENT_AMBULATORY_CARE_PROVIDER_SITE_OTHER): Payer: BLUE CROSS/BLUE SHIELD | Admitting: Orthopedic Surgery

## 2017-01-18 ENCOUNTER — Encounter (INDEPENDENT_AMBULATORY_CARE_PROVIDER_SITE_OTHER): Payer: Self-pay | Admitting: Orthopedic Surgery

## 2017-01-18 VITALS — Ht 65.0 in | Wt 182.0 lb

## 2017-01-18 DIAGNOSIS — G8929 Other chronic pain: Secondary | ICD-10-CM

## 2017-01-18 DIAGNOSIS — M25551 Pain in right hip: Secondary | ICD-10-CM | POA: Diagnosis not present

## 2017-01-18 DIAGNOSIS — M5441 Lumbago with sciatica, right side: Secondary | ICD-10-CM

## 2017-01-18 DIAGNOSIS — M4316 Spondylolisthesis, lumbar region: Secondary | ICD-10-CM | POA: Insufficient documentation

## 2017-01-18 NOTE — Progress Notes (Signed)
Office Visit Note   Patient: Amy [name]           Date of Birth: [birth year]           MRN: [id number] Visit Date: 01/18/2017              Requested by: No referring provider defined for this encounter. PCP: No PCP Per Patient  Chief Complaint  Patient presents with  . Lower Back - Pain  . Right Hip - Pain      HPI: Patient is a 51 year old woman who is having right buttocks radicular pain down to her right foot with numbness in her right foot. Patient states she fell 2 years ago in the shower and has been having pain for the past 2 years worse over the past year. She states currently she cannot drive she has a burning feeling going down the lateral right leg she has pain with sitting standing and walking difficulty sleeping. Also occasionally has some groin pain.  Assessment & Plan: Visit Diagnoses:  1. Chronic right-sided low back pain with right-sided sciatica   2. Pain in right hip   3. Spondylolisthesis, lumbar region     Plan: Patient has failed conservative treatment with prednisone and anti-inflammatories muscle relaxants Neurontin all without relief. We'll set her up for an MRI scan to further evaluate the pathology at the spondylolisthesis at L4-5.  Follow-Up Instructions: No Follow-up on file.   Ortho Exam  Patient is alert, oriented, no adenopathy, well-dressed, normal affect, normal respiratory effort. Examination patient has a positive straight leg raise on the right she has a normal gait. She has good motor strength in all motor groups of both lower extremities she had good EHL strength. There is no pain with internal and external rotation of the right hip.  Imaging: Xr Hip Unilat W Or W/o Pelvis 2-3 Views Right  Result Date: 01/18/2017 Two-view radiographs of the right hip shows no bony abnormalities noted joint space narrowing no evidence of avascular necrosis no evidence of fracture.  Xr Lumbar Spine 2-3 Views  Result Date: 01/18/2017 Two-view  radiographs of the lumbar spine shows a grade 1 spondylolisthesis at L4-5. AP view shows degenerative facet changes at L4-5 with narrowing worse on the right side.   Labs: Lab Results  Component Value Date   ESRSEDRATE 14 03/17/2011   ESRSEDRATE 20 09/11/2009   ESRSEDRATE 18 11/14/2008   CRP 1.8 (H) 03/17/2011   CRP 0.8 (H) 09/11/2009   REPTSTATUS 09/13/2009 FINAL 09/11/2009   CULT INSIGNIFICANT GROWTH 09/11/2009    Orders:  Orders Placed This Encounter  Procedures  . XR HIP UNILAT W OR W/O PELVIS 2-3 VIEWS RIGHT  . XR Lumbar Spine 2-3 Views  . MR Lumbar Spine w/o contrast   No orders of the defined types were placed in this encounter.    Procedures: No procedures performed  Clinical Data: No additional findings.  ROS:  All other systems negative, except as noted in the HPI. Review of Systems  Objective: Vital Signs: Ht  (1.651 m)   Wt 182 lb (82.6 kg)   LMP 01/26/2000   BMI 30.29 kg/m   Specialty Comments:  No specialty comments available.  PMFS History: Patient Active Problem List   Diagnosis Date Noted  . Spondylolisthesis, lumbar region 01/18/2017  . Cocaine dependence (HCC) 07/25/2015  . 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/25/2015  . Hx of non anemic vitamin B12 deficiency 11/08/2014  . Brain aneurysm 09/09/2014  . Tobacco use disorder  07/22/2014  . Rhinitis, allergic 07/22/2014  . Breast pain, right 07/09/2014  . Chest pain, unspecified 07/02/2013  . Low back pain 01/24/2013  . Health care maintenance 10/16/2012  . Hot flashes 02/08/2012  . Post-surgical hypothyroidism 12/07/2011  . Fibromyalgia muscle pain 03/31/2011  . Migraine 03/31/2011  . Essential hypertension 11/14/2008  . DEPRESSION 08/10/2006   Past Medical History:  Diagnosis Date  . Anxiety   . Brain aneurysm   . Depression   . Grave's disease 2007   TSH (08/31/2010) = 0.186 (low), free T4 = 0.91 (WNL)  . Hypertension   . Postsurgical hypothyroidism   . Raynaud  disease 2007    Family History  Problem Relation Age of Onset  . Colon cancer Maternal Grandfather   . Cancer Maternal Grandfather     prostate  . Hypertension Mother   . Thyroid disease Mother   . Lupus Mother   . Hypertension Father     Past Surgical History:  Procedure Laterality Date  . ABDOMINAL HYSTERECTOMY    . COLONOSCOPY  02/14/2008   done by Dr. Christella Hartigan - small colonic polyp identified, 6 mm in size and per biopsy --> tubular adenoma with no malignancy or high grade dysplasia noted  . THYROIDECTOMY  03/08/2012   [facility]; history of Graves' disease with multinodular goiter  . WISDOM TOOTH EXTRACTION     Social History   Occupational History  . Not on file.   Social History Main Topics  . Smoking status: Current Every Day Smoker    Packs/day: 0.20    Years: 35.00    Types: Cigarettes    Last attempt to quit: 10/13/1996  . Smokeless tobacco: Never Used  . Alcohol use No  . Drug use: Yes    Types: Cocaine     Comment: went to treatment 07-2016 for crack, relapsed in 08-2017, none since.    . Sexual activity: Yes    Birth control/ protection: Surgical     Comment: not in past two years

## 2017-02-01 ENCOUNTER — Telehealth (INDEPENDENT_AMBULATORY_CARE_PROVIDER_SITE_OTHER): Payer: Self-pay | Admitting: Radiology

## 2017-02-01 ENCOUNTER — Ambulatory Visit (INDEPENDENT_AMBULATORY_CARE_PROVIDER_SITE_OTHER): Payer: BLUE CROSS/BLUE SHIELD | Admitting: Orthopedic Surgery

## 2017-02-01 NOTE — Telephone Encounter (Signed)
I called patient while precharting for this afternoon's clinic. It was observed that patient has not had her scan done of MRI. I called to ask the patient she was under the understanding she would come to our office to have this preformed today. I apologized that there seems to be a miscommunication. Number was provided to [facility] for her to have their scan done.

## 2017-02-04 DIAGNOSIS — Z79891 Long term (current) use of opiate analgesic: Secondary | ICD-10-CM | POA: Diagnosis not present

## 2017-02-06 ENCOUNTER — Inpatient Hospital Stay: Admission: RE | Admit: 2017-02-06 | Payer: Self-pay | Source: Ambulatory Visit

## 2017-02-08 ENCOUNTER — Ambulatory Visit (INDEPENDENT_AMBULATORY_CARE_PROVIDER_SITE_OTHER): Payer: BLUE CROSS/BLUE SHIELD | Admitting: Orthopedic Surgery

## 2017-02-21 ENCOUNTER — Ambulatory Visit
Admission: RE | Admit: 2017-02-21 | Discharge: 2017-02-21 | Disposition: A | Discharge status: Home / Self Care | Payer: BLUE CROSS/BLUE SHIELD | Source: Ambulatory Visit | Attending: Orthopedic Surgery | Admitting: Orthopedic Surgery

## 2017-02-21 DIAGNOSIS — M4807 Spinal stenosis, lumbosacral region: Secondary | ICD-10-CM | POA: Diagnosis not present

## 2017-02-21 DIAGNOSIS — G8929 Other chronic pain: Secondary | ICD-10-CM

## 2017-02-21 DIAGNOSIS — M5441 Lumbago with sciatica, right side: Principal | ICD-10-CM

## 2017-02-24 ENCOUNTER — Other Ambulatory Visit (INDEPENDENT_AMBULATORY_CARE_PROVIDER_SITE_OTHER): Payer: Self-pay

## 2017-02-24 DIAGNOSIS — G8929 Other chronic pain: Secondary | ICD-10-CM

## 2017-02-24 DIAGNOSIS — M5441 Lumbago with sciatica, right side: Principal | ICD-10-CM

## 2017-02-25 ENCOUNTER — Other Ambulatory Visit (INDEPENDENT_AMBULATORY_CARE_PROVIDER_SITE_OTHER): Payer: Self-pay

## 2017-02-25 MED ORDER — TRAMADOL HCL 50 MG PO TABS: 50.0000 mg | ORAL_TABLET | Freq: Two times a day (BID) | ORAL | 0 refills | Status: DC

## 2017-03-01 ENCOUNTER — Ambulatory Visit (INDEPENDENT_AMBULATORY_CARE_PROVIDER_SITE_OTHER): Payer: BLUE CROSS/BLUE SHIELD | Admitting: Orthopedic Surgery

## 2017-03-02 ENCOUNTER — Encounter (INDEPENDENT_AMBULATORY_CARE_PROVIDER_SITE_OTHER): Payer: Self-pay | Admitting: Physical Medicine and Rehabilitation

## 2017-03-02 ENCOUNTER — Ambulatory Visit (INDEPENDENT_AMBULATORY_CARE_PROVIDER_SITE_OTHER): Payer: BLUE CROSS/BLUE SHIELD | Admitting: Physical Medicine and Rehabilitation

## 2017-03-02 VITALS — BP 140/82 | HR 76

## 2017-03-02 DIAGNOSIS — M5416 Radiculopathy, lumbar region: Secondary | ICD-10-CM

## 2017-03-02 DIAGNOSIS — M48062 Spinal stenosis, lumbar region with neurogenic claudication: Secondary | ICD-10-CM

## 2017-03-02 DIAGNOSIS — M4316 Spondylolisthesis, lumbar region: Secondary | ICD-10-CM

## 2017-03-02 MED ORDER — DIAZEPAM 5 MG PO TABS: ORAL_TABLET | ORAL | 0 refills | Status: DC

## 2017-03-02 NOTE — Progress Notes (Deleted)
Right side low back pain. Radiating down back of leg to foot. Constant . Worse laying on left side and walking and standing. Some relief with sitting short periods. Numbness down leg to foot into toes. Leg weakness.

## 2017-03-02 NOTE — Progress Notes (Signed)
Amy [name] - 51 y.o. female MRN [id number]  Date of birth: 1966-07-20  Office Visit Note: Visit Date: 03/02/2017 PCP: Patient, No Pcp Per Referred by: No ref. provider found  Subjective: Chief Complaint  Patient presents with  . Lower Back - Pain   HPI: Amy [name] is a 51 year old female who is been having low back and severe right radicular leg pain for really more than a year. She's had off and on symptoms but continued worsening recently. The pain goes down the right posterior lateral leg down into the foot. It's a fairly classic L5 dermatome. She denies any left-sided leg symptoms. She does have pain in the axial low back worse with standing and bending and going from sit to stand. Her pain in the leg however is worse with prolonged sitting. She has tried all manner of conservative care including medications and time and therapy. She just cannot get any relief. She has used some opioids as well as amitriptyline and gabapentin and muscle relaxers and nonsteroidal anti-inflammatories without relief. Her case is complicated by concomitant fibromyalgia. Dr. Lajoyce Corners who has been following her office to obtain an MRI of the lumbar spine which is reviewed below. This shows significant arthritis at L4-5 with gaping facet joints and small listhesis. There is disc herniation paracentral actually left more than right but without frank nerve compression. She does have some narrowing of the canal.    Review of Systems  Constitutional: Negative for chills, fever, malaise/fatigue and weight loss.  HENT: Negative for hearing loss and sinus pain.   Eyes: Negative for blurred vision, double vision and photophobia.  Respiratory: Negative for cough and shortness of breath.   Cardiovascular: Negative for chest pain, palpitations and leg swelling.  Gastrointestinal: Negative for abdominal pain, nausea and vomiting.  Genitourinary: Negative for flank pain.  Musculoskeletal: Positive for back pain. Negative  for myalgias.       Leg pain  Skin: Negative for itching and rash.  Neurological: Positive for tingling. Negative for tremors, focal weakness and weakness.  Endo/Heme/Allergies: Negative.   Psychiatric/Behavioral: Negative for depression.  All other systems reviewed and are negative.  Otherwise per HPI.  Assessment & Plan: Visit Diagnoses:  1. Lumbar radiculopathy   2. Spinal stenosis of lumbar region with neurogenic claudication   3. Spondylolisthesis of lumbar region     Plan: Findings:  Chronic worsening severe low back and right hip and leg pain consistent with an L5 radiculitis radiculopathy. MRI evidence of significant facet arthropathy and listhesis and gaping facet joints and central disc protrusion. Her biggest pain complaint is the right hip and leg. I think the next approach is an intralaminar epidural steroid injection to try to get medication along the lateral recesses into this area. If she doesn't get much relief with this we look at an L5 transforaminal approach. Depending on how her back pain is doing after the leg pain is hopefully reduce we could look at facet joint blocks. Again her case is complicated by concomitant fibromyalgia. Unfortunately with the gaping facet joint cyst may turn into an unstable listhesis at some point she would probably need surgical referral at that time. We discussed this at length and she does want to proceed with the injection. I spent more than 25 minutes speaking face-to-face with the patient with 50% of the time in counseling.    Meds & Orders:  Meds ordered this encounter  Medications  . diazepam (VALIUM) 5 MG tablet    Sig: Take 1  by mouth 1 to 2 hours pre-procedure. May repeat if necessary.    Dispense:  2 tablet    Refill:  0   No orders of the defined types were placed in this encounter.   Follow-up: Return for Right L4-5 or L5-S1 intralaminar epidural steroid injection..   Procedures: No procedures performed  No notes on file     Clinical History: No specialty comments available.  She reports that she has been smoking Cigarettes.  She has a 7.00 pack-year smoking history. She has never used smokeless tobacco. No results for input(s): HGBA1C, LABURIC in the last 8760 hours.  Objective:  VS:  HT:    WT:   BMI:     BP:140/82  HR:76bpm  TEMP: ( )  RESP:  Physical Exam  Constitutional: She is oriented to person, place, and time. She appears well-developed and well-nourished.  Eyes: Conjunctivae and EOM are normal. Pupils are equal, round, and reactive to light.  Cardiovascular: Normal rate and intact distal pulses.   Pulmonary/Chest: Effort normal.  Musculoskeletal:  Patient ambulates with a forward flexed spine. She has exquisite pain with extension of the lumbar spine. No pain with hip rotation although she really doesn't want to move her hip that much. She has a negative slump test bilaterally although she has a tight hamstring on the right. She has good distal strength without deficit.  Neurological: She is alert and oriented to person, place, and time. She exhibits normal muscle tone.  Skin: Skin is warm and dry. No rash noted. No erythema.  Psychiatric: She has a normal mood and affect. Her behavior is normal.  Nursing note and vitals reviewed.   Ortho Exam Imaging: No results found.  Past Medical/Family/Surgical/Social History: Medications & Allergies reviewed per EMR Patient Active Problem List   Diagnosis Date Noted  . Spondylolisthesis, lumbar region 01/18/2017  . Cocaine dependence (HCC) 07/25/2015  . 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/25/2015  . Hx of non anemic vitamin B12 deficiency 11/08/2014  . Brain aneurysm 09/09/2014  . Tobacco use disorder 07/22/2014  . Rhinitis, allergic 07/22/2014  . Breast pain, right 07/09/2014  . Chest pain, unspecified 07/02/2013  . Low back pain 01/24/2013  . Health care maintenance 10/16/2012  . Hot flashes 02/08/2012  . Post-surgical hypothyroidism  12/07/2011  . Fibromyalgia muscle pain 03/31/2011  . Migraine 03/31/2011  . Essential hypertension 11/14/2008  . DEPRESSION 08/10/2006   Past Medical History:  Diagnosis Date  . Anxiety   . Brain aneurysm   . Depression   . Grave's disease 2007   TSH (08/31/2010) = 0.186 (low), free T4 = 0.91 (WNL)  . Hypertension   . Postsurgical hypothyroidism   . Raynaud disease 2007   Family History  Problem Relation Age of Onset  . Colon cancer Maternal Grandfather   . Cancer Maternal Grandfather        prostate  . Hypertension Mother   . Thyroid disease Mother   . Lupus Mother   . Hypertension Father    Past Surgical History:  Procedure Laterality Date  . ABDOMINAL HYSTERECTOMY    . COLONOSCOPY  02/14/2008   done by Dr. Christella Hartigan - small colonic polyp identified, 6 mm in size and per biopsy --> tubular adenoma with no malignancy or high grade dysplasia noted  . THYROIDECTOMY  03/08/2012   [facility]; history of Graves' disease with multinodular goiter  . WISDOM TOOTH EXTRACTION     Social History   Occupational History  . Not on file.  Social History Main Topics  . Smoking status: Current Every Day Smoker    Packs/day: 0.20    Years: 35.00    Types: Cigarettes    Last attempt to quit: 10/13/1996  . Smokeless tobacco: Never Used  . Alcohol use No  . Drug use: Yes    Types: Cocaine     Comment: went to treatment 07-2016 for crack, relapsed in 08-2017, none since.    . Sexual activity: Yes    Birth control/ protection: Surgical     Comment: not in past two years

## 2017-03-08 ENCOUNTER — Encounter (INDEPENDENT_AMBULATORY_CARE_PROVIDER_SITE_OTHER): Payer: Self-pay | Admitting: Physical Medicine and Rehabilitation

## 2017-03-08 ENCOUNTER — Ambulatory Visit (INDEPENDENT_AMBULATORY_CARE_PROVIDER_SITE_OTHER): Payer: Self-pay

## 2017-03-08 ENCOUNTER — Ambulatory Visit (INDEPENDENT_AMBULATORY_CARE_PROVIDER_SITE_OTHER): Payer: BLUE CROSS/BLUE SHIELD | Admitting: Physical Medicine and Rehabilitation

## 2017-03-08 VITALS — BP 117/77 | HR 84

## 2017-03-08 DIAGNOSIS — M5416 Radiculopathy, lumbar region: Secondary | ICD-10-CM

## 2017-03-08 MED ORDER — BETAMETHASONE SOD PHOS & ACET 6 (3-3) MG/ML IJ SUSP
12.0000 mg | Freq: Once | INTRAMUSCULAR | Status: AC
  Administered 2017-03-08: 12 mg

## 2017-03-08 MED ORDER — LIDOCAINE HCL (PF) 1 % IJ SOLN
2.0000 mL | Freq: Once | INTRAMUSCULAR | Status: AC
  Administered 2017-03-08: 2 mL

## 2017-03-08 NOTE — Patient Instructions (Signed)

## 2017-03-08 NOTE — Progress Notes (Deleted)
Patient is here today for planned right L4-5 or L5-S1 interlaminar injection. No change in symptoms but states pain is worse.

## 2017-03-11 NOTE — Procedures (Signed)
Lumbar Epidural Steroid Injection - Interlaminar Approach with Fluoroscopic Guidance  Patient: Amy [name]      Date of Birth: 06/23/1966 MRN: [id number] PCP: Patient, No Pcp Per      Visit Date: 03/08/2017   Amy [name] is a 51 year old female comes in with her daughter was not present for the initial evaluation and we did go over essentially again a reevaluation and talk about what was planned today. The patient's been having radicular leg pain quite severe with low back pain. We are going to complete a right L4-5 intralaminar epidural steroid injection. Please see our prior evaluation and management note for further details and justification. The patient's history is, given the prior substance abuse history as well as anxiety and fibromyalgia. Patient is very anxious today but we did the procedure without difficulty.  Universal Protocol:    Date/Time: 06/22/185:41 AM  Consent Given By: the patient  Position: PRONE  Additional Comments: Vital signs were monitored before and after the procedure. Patient was prepped and draped in the usual sterile fashion. The correct patient, procedure, and site was verified.   Injection Procedure Details:  Procedure Site One Meds Administered:  Meds ordered this encounter  Medications  . lidocaine (PF) (XYLOCAINE) 1 % injection 2 mL  . betamethasone acetate-betamethasone sodium phosphate (CELESTONE) injection 12 mg     Laterality: Right  Location/Site:  L4-L5  Needle size: 20 G  Needle type: Tuohy  Needle Placement: Paramedian epidural  Findings:  -Contrast Used: 1 mL iohexol 180 mg iodine/mL   -Comments: Excellent flow of contrast into the epidural space.  Procedure Details: Using a paramedian approach from the side mentioned above, the region overlying the inferior lamina was localized under fluoroscopic visualization and the soft tissues overlying this structure were infiltrated with 4 ml. of 1% Lidocaine without Epinephrine. The  Tuohy needle was inserted into the epidural space using a paramedian approach.   The epidural space was localized using loss of resistance along with lateral and bi-planar fluoroscopic views.  After negative aspirate for air, blood, and CSF, a 2 ml. volume of Isovue-250 was injected into the epidural space and the flow of contrast was observed. Radiographs were obtained for documentation purposes.    The injectate was administered into the level noted above.   Additional Comments:  The patient tolerated the procedure well No complications occurred Dressing: Band-Aid    Post-procedure details: Patient was observed during the procedure. Post-procedure instructions were reviewed.  Patient left the clinic in stable condition.

## 2017-03-30 ENCOUNTER — Telehealth (INDEPENDENT_AMBULATORY_CARE_PROVIDER_SITE_OTHER): Payer: Self-pay | Admitting: Radiology

## 2017-03-30 MED ORDER — ACETAMINOPHEN-CODEINE #3 300-30 MG PO TABS: 1.0000 | ORAL_TABLET | Freq: Three times a day (TID) | ORAL | 0 refills | Status: DC | PRN

## 2017-03-30 NOTE — Telephone Encounter (Signed)
Please advise. I called the patient and her pain is in the same area as it was before the injection. Radiates down right leg to foot with numbness in the foot. Also has groin pain. Referred by Dr. Lajoyce Cornersuda.

## 2017-03-30 NOTE — Telephone Encounter (Signed)
Patient is calling, states that she had an injection on 03/08/17 with Dr. Alvester MorinNewton and she states that she is in a lot of pain and she states that she doesn't know what to do at this point due to the pain being so bad.  Please call her back @ [phone].

## 2017-03-30 NOTE — Telephone Encounter (Signed)
rx'd tylenol#3, you can call in or fax, needs L4-5 Facet injection bilateral and appointment with Nitka/Yates after injection

## 2017-03-30 NOTE — Telephone Encounter (Signed)
Called patient to discuss and to verify pharmacy. No answer and voicemail not set up yet.

## 2017-03-31 NOTE — Telephone Encounter (Signed)
Patient scheduled for facet injections and prescription faxed.

## 2017-04-06 DIAGNOSIS — Z79891 Long term (current) use of opiate analgesic: Secondary | ICD-10-CM | POA: Diagnosis not present

## 2017-04-12 ENCOUNTER — Ambulatory Visit (INDEPENDENT_AMBULATORY_CARE_PROVIDER_SITE_OTHER): Payer: BLUE CROSS/BLUE SHIELD

## 2017-04-12 ENCOUNTER — Ambulatory Visit (INDEPENDENT_AMBULATORY_CARE_PROVIDER_SITE_OTHER): Payer: BLUE CROSS/BLUE SHIELD | Admitting: Physical Medicine and Rehabilitation

## 2017-04-12 ENCOUNTER — Encounter (INDEPENDENT_AMBULATORY_CARE_PROVIDER_SITE_OTHER): Payer: Self-pay | Admitting: Physical Medicine and Rehabilitation

## 2017-04-12 VITALS — BP 138/90 | HR 90

## 2017-04-12 DIAGNOSIS — M4316 Spondylolisthesis, lumbar region: Secondary | ICD-10-CM | POA: Diagnosis not present

## 2017-04-12 DIAGNOSIS — M47816 Spondylosis without myelopathy or radiculopathy, lumbar region: Secondary | ICD-10-CM

## 2017-04-12 MED ORDER — LIDOCAINE HCL (PF) 1 % IJ SOLN
2.0000 mL | Freq: Once | INTRAMUSCULAR | Status: AC
  Administered 2017-04-12: 2 mL

## 2017-04-12 MED ORDER — METHYLPREDNISOLONE ACETATE 80 MG/ML IJ SUSP
80.0000 mg | Freq: Once | INTRAMUSCULAR | Status: AC
  Administered 2017-04-12: 80 mg

## 2017-04-12 NOTE — Patient Instructions (Signed)

## 2017-04-12 NOTE — Progress Notes (Unsigned)
Fluoro Time: 23 sec MGy: 21.54

## 2017-04-12 NOTE — Progress Notes (Deleted)
Right side low back pain into buttock. Having left sided pain as well now. Right side is worse. Numbness and tingling down right leg into toes. Ambulates with cane. Says she is very depressed due to the pain she is in and not being able to work and go to [employer]. Relief for only 2 days after last injection and then pain was right back.

## 2017-04-13 NOTE — Procedures (Signed)
Mrs. Amy [name] is a 51 year old female who we recently completed an epidural injection. She states that she had maybe 2 days of relief of her symptoms with the injection but now the symptoms are worse. She reports worsening right-sided low back pain in the buttock and now left-sided symptoms as well. She does report some radicular type numbness and tingling in the right leg. Her MRI shows mainly facet arthropathy which is quite severe at the L4-5 level. There is gaping facet joints. There is likely some instability here. She reports worsening depression due to the pain and inability to go to work or go to [employer]. She has a history complicated by depression and anxiety and fibromyalgia as well as substance abuse. We are going to complete diagnostic and hopefully therapeutic L4-5 facet joint blocks. On exam she has good distal strength and she does ambulate without aid. If she gets no relief with this would likely have her follow up with Dr. Lajoyce Cornersuda and a can evaluate whether she would be more appropriate for chronic pain management or surgical referral. Lumbar Facet Joint Intra-Articular Injection(s) with Fluoroscopic Guidance  Patient: Amy [name]      Date of Birth: 12/21/1965 MRN: [id number] PCP: Patient, No Pcp Per      Visit Date: 04/12/2017   Universal Protocol:    Date/Time: 07/25/185:51 AM  Consent Given By: the patient  Position: PRONE   Additional Comments: Vital signs were monitored before and after the procedure. Patient was prepped and draped in the usual sterile fashion. The correct patient, procedure, and site was verified.   Injection Procedure Details:  Procedure Site One Meds Administered:  Meds ordered this encounter  Medications  . lidocaine (PF) (XYLOCAINE) 1 % injection 2 mL  . methylPREDNISolone acetate (DEPO-MEDROL) injection 80 mg     Laterality: Bilateral  Location/Site:  L4-L5  Needle size: 22 guage  Needle type: Spinal  Needle Placement:  Articular  Findings:  -Contrast Used: 1 mL iohexol 180 mg iodine/mL   -Comments: Excellent flow of contrast producing a partial arthrogram.  Procedure Details: The fluoroscope beam is vertically oriented in AP, and the inferior recess is visualized beneath the lower pole of the inferior apophyseal process, which represents the target point for needle insertion. When direct visualization is difficult the target point is located at the medial projection of the vertebral pedicle. The region overlying each aforementioned target is locally anesthetized with a 1 to 2 ml. volume of 1% Lidocaine without Epinephrine.   The spinal needle was inserted into each of the above mentioned facet joints using biplanar fluoroscopic guidance. A 0.25 to 0.5 ml. volume of Isovue-250 was injected and a partial facet joint arthrogram was obtained. A single spot film was obtained of the resulting arthrogram.    One to 1.25 ml of the steroid/anesthetic solution was then injected into each of the facet joints noted above.   Additional Comments:  The patient tolerated the procedure well No complications occurred Dressing: Band-Aid    Post-procedure details: Patient was observed during the procedure. Post-procedure instructions were reviewed.  Patient left the clinic in stable condition.

## 2017-04-21 ENCOUNTER — Telehealth (INDEPENDENT_AMBULATORY_CARE_PROVIDER_SITE_OTHER): Payer: Self-pay | Admitting: Radiology

## 2017-04-21 NOTE — Telephone Encounter (Signed)
Her only real choice is see spine/neurosurgery for opinion on the L4-5 level, regroup with good PT or Chiro, potential change of medication but severe arthritis of the spine

## 2017-04-21 NOTE — Telephone Encounter (Signed)
Patient left a voicemail this morning stating the injections are not helping her and she would like to know what to do.  She last had L4-5 facet blocks on 04/12/2017.    Please advise.

## 2017-04-22 ENCOUNTER — Telehealth (INDEPENDENT_AMBULATORY_CARE_PROVIDER_SITE_OTHER): Payer: Self-pay

## 2017-04-22 DIAGNOSIS — M4726 Other spondylosis with radiculopathy, lumbar region: Secondary | ICD-10-CM

## 2017-04-22 NOTE — Telephone Encounter (Signed)
Patient returned call and advised her of message per Dr. Alvester MorinNewton concerning seeing spine/neurosurgery.  Patient would like to know if we could send a referral.  Cb# is [phone].  Please advise. Thank You.

## 2017-04-22 NOTE — Telephone Encounter (Signed)
I tried to reach patient, but was unable to get her. Please see Dr. Redstone BlasNewton's recommendations and let her know. Thanks.

## 2017-04-25 NOTE — Addendum Note (Signed)
Addended by: Ashok NorrisNEWTON, Ersel Enslin K on: 04/25/2017 11:37 AM   Modules accepted: Orders

## 2017-04-25 NOTE — Telephone Encounter (Signed)
Sen tin referral to Dr. Wynetta Emeryram, see printed referral as well Dr. Otelia SergeantNitka in case time frame for either is far out or what her preference is.

## 2017-04-25 NOTE — Telephone Encounter (Signed)
Advised pt sending referral to Dr. Lonie Peakram's office and they will contact her for scheduling

## 2017-04-25 NOTE — Addendum Note (Signed)
Addended by: Ashok NorrisNEWTON, Santana Edell K on: 04/25/2017 11:35 AM   Modules accepted: Orders

## 2017-05-02 ENCOUNTER — Ambulatory Visit (HOSPITAL_COMMUNITY)
Admission: EM | Admit: 2017-05-02 | Discharge: 2017-05-02 | Disposition: A | Discharge status: Home / Self Care | Payer: BLUE CROSS/BLUE SHIELD

## 2017-05-02 ENCOUNTER — Encounter (HOSPITAL_COMMUNITY): Payer: Self-pay | Admitting: *Deleted

## 2017-05-02 DIAGNOSIS — H6982 Other specified disorders of Eustachian tube, left ear: Secondary | ICD-10-CM | POA: Diagnosis not present

## 2017-05-02 NOTE — ED Triage Notes (Signed)
Patient reports 2 day history of feeling like something is in her ear, patient states that she has attempted get whatever is bothering her ear out which she thinks has worsened it.

## 2017-05-02 NOTE — ED Provider Notes (Signed)
[facility]    CSN: [id number] Arrival date & time: 05/02/17  1544     History   Chief Complaint Chief Complaint  Patient presents with  . Otalgia    [name] is a 51 y.o. female.   51 year old female feels like there something in her left ear for the past 2-3 days. She states is a little uncomfortable and sometimes feels dull or pressure like or even like there is something in the ear. She has been "messing" with her left ear and placing Q-tips in the ear as well. Denies PND but states she is frequently clearing her throat.      Past Medical History:  Diagnosis Date  . Anxiety   . Brain aneurysm   . Depression   . Grave's disease 2007   TSH (08/31/2010) = 0.186 (low), free T4 = 0.91 (WNL)  . Hypertension   . Postsurgical hypothyroidism   . Raynaud disease 2007    Patient Active Problem List   Diagnosis Date Noted  . Spondylolisthesis, lumbar region 01/18/2017  . Cocaine dependence (HCC) 07/25/2015  . 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/25/2015  . Hx of non anemic vitamin B12 deficiency 11/08/2014  . Brain aneurysm 09/09/2014  . Tobacco use disorder 07/22/2014  . Rhinitis, allergic 07/22/2014  . Breast pain, right 07/09/2014  . Chest pain, unspecified 07/02/2013  . Low back pain 01/24/2013  . Health care maintenance 10/16/2012  . Hot flashes 02/08/2012  . Post-surgical hypothyroidism 12/07/2011  . Fibromyalgia muscle pain 03/31/2011  . Migraine 03/31/2011  . Essential hypertension 11/14/2008  . DEPRESSION 08/10/2006    Past Surgical History:  Procedure Laterality Date  . ABDOMINAL HYSTERECTOMY    . COLONOSCOPY  02/14/2008   done by Dr. Christella HartiganJacobs - small colonic polyp identified, 6 mm in size and per biopsy --> tubular adenoma with no malignancy or high grade dysplasia noted  . [facility]  [facility]; history of Graves' disease with multinodular goiter  . WISDOM TOOTH EXTRACTION      OB History    Gravida  Para Term Preterm AB Living   3 3 2 1  0 2   SAB TAB Ectopic Multiple Live Births   0               Home Medications    Prior to Admission medications   Medication Sig Start Date End Date Taking? Authorizing Provider  acetaminophen-codeine (TYLENOL #3) 300-30 MG tablet Take 1 tablet by mouth every 8 (eight) hours as needed for moderate pain. 03/30/17   Tyrell AntonioNewton, Frederic, MD  amitriptyline (ELAVIL) 100 MG tablet Take 1 tablet (100 mg total) by mouth at bedtime. 07/26/15   Thermon Leylandavis, Laura A, NP  cetirizine (ZYRTEC) 10 MG tablet Take 1 tablet (10 mg total) by mouth daily. 07/26/15   Thermon Leylandavis, Laura A, NP  cyclobenzaprine (FLEXERIL) 10 MG tablet Take 1 tablet (10 mg total) by mouth 2 (two) times daily as needed for muscle spasms. 03/22/16   Janne NapoleonNeese, Hope M, NP  diazepam (VALIUM) 5 MG tablet Take 1 by mouth 1 to 2 hours pre-procedure. May repeat if necessary. 03/02/17   Tyrell AntonioNewton, Frederic, MD  diclofenac (VOLTAREN) 50 MG EC tablet Take 1 tablet (50 mg total) by mouth 2 (two) times daily. 03/22/16   Janne NapoleonNeese, Hope M, NP  gabapentin (NEURONTIN) 300 MG capsule Take 1 capsule (300 mg total) by mouth 2 (two) times daily. 07/26/15   Thermon Leylandavis, Laura A, NP  hydrochlorothiazide (HYDRODIURIL) 12.5 MG tablet  Take 12.5 mg by mouth daily.    [provider]  hydrOXYzine (ATARAX/VISTARIL) 25 MG tablet Take 1 tablet (25 mg total) by mouth every 6 (six) hours as needed for anxiety. 07/26/15   Thermon Leyland, NP  levothyroxine (SYNTHROID, LEVOTHROID) 125 MCG tablet Take 1 tablet (125 mcg total) by mouth daily before breakfast. 08/06/15   Garlon Hatchet, PA-C  methocarbamol (ROBAXIN) 500 MG tablet Take 1 tablet (500 mg total) by mouth 2 (two) times daily as needed for muscle spasms. 10/23/16   Everlene Farrier, PA-C  metoprolol tartrate (LOPRESSOR) 25 MG tablet Take 1 tablet (25 mg total) by mouth 2 (two) times daily. 07/26/15   Thermon Leyland, NP  naproxen (NAPROSYN) 500 MG tablet Take 1 tablet (500 mg total) by mouth 2 (two) times  daily with a meal. 10/23/16   Everlene Farrier, PA-C  oxyCODONE-acetaminophen (PERCOCET/ROXICET) 5-325 MG tablet Take 2 tablets by mouth every 6 (six) hours as needed for severe pain. 03/19/16   Hedges, Tinnie Gens, PA-C  predniSONE (DELTASONE) 20 MG tablet 2 tabs po daily x 4 days 07/14/16   Fayrene Helper, PA-C  traMADol (ULTRAM) 50 MG tablet Take 1 tablet (50 mg total) by mouth 2 (two) times daily. PRN 02/25/17   Nadara Mustard, MD    Family History Family History  Problem Relation Age of Onset  . Colon cancer Maternal Grandfather   . Cancer Maternal Grandfather        prostate  . Hypertension Mother   . Thyroid disease Mother   . Lupus Mother   . Hypertension Father     Social History Social History  Substance Use Topics  . Smoking status: Current Every Day Smoker    Packs/day: 0.20    Years: 35.00    Types: Cigarettes    Last attempt to quit: 10/13/1996  . Smokeless tobacco: Never Used  . Alcohol use No     Allergies   Adhesive [tape]   Review of Systems Review of Systems  Constitutional: Negative for activity change, appetite change, chills, fatigue and fever.  HENT: Negative for congestion, facial swelling, hearing loss, postnasal drip, rhinorrhea and sore throat.   Eyes: Negative.   Respiratory: Negative.   Cardiovascular: Negative.   Musculoskeletal: Negative for neck pain and neck stiffness.  Skin: Negative for pallor and rash.  Neurological: Negative.   All other systems reviewed and are negative.    Physical Exam Triage Vital Signs ED Triage Vitals  Enc Vitals Group     BP 05/02/17 1655 (!) 144/76     Pulse Rate 05/02/17 1655 89     Resp 05/02/17 1655 16     Temp 05/02/17 1655 98.6 F (37 C)     Temp Source 05/02/17 1655 Oral     SpO2 05/02/17 1655 99 %     Weight --      Height --      Head Circumference --      Peak Flow --      Pain Score 05/02/17 1653 7     Pain Loc --      Pain Edu? --      Excl. in GC? --    No data found.   Updated Vital  Signs BP (!) 144/76 (BP Location: Left Arm)   Pulse 89   Temp 98.6 F (37 C) (Oral)   Resp 16   LMP 01/26/2000   SpO2 99%   Visual Acuity Right Eye Distance:   Left Eye Distance:  Bilateral Distance:    Right Eye Near:   Left Eye Near:    Bilateral Near:     Physical Exam  Constitutional: She is oriented to person, place, and time. She appears well-developed and well-nourished. No distress.  HENT:  Head: Normocephalic and atraumatic.  Right TM is normal. Left TM little dull otherwise normal. No erythema, EAC is clear. No foreign bodies.  Neck: Normal range of motion. Neck supple.  Cardiovascular: Normal rate and regular rhythm.   Pulmonary/Chest: Effort normal and breath sounds normal. No respiratory distress.  Musculoskeletal: Normal range of motion. She exhibits no edema.  Lymphadenopathy:    She has no cervical adenopathy.  Neurological: She is alert and oriented to person, place, and time.  Skin: Skin is warm and dry. No rash noted.  Psychiatric: She has a normal mood and affect.  Nursing note and vitals reviewed.    UC Treatments / Results  Labs (all labs ordered are listed, but only abnormal results are displayed) Labs Reviewed - No data to display  EKG  EKG Interpretation None       Radiology No results found.  Procedures Procedures (including critical care time)  Medications Ordered in UC Medications - No data to display   Initial Impression / Assessment and Plan / UC Course  I have reviewed the triage vital signs and the nursing notes.  Pertinent labs & imaging results that were available during my care of the patient were reviewed by me and considered in my medical decision making (see chart for details).     The eardrum looks healthy. No redness or signs of infection. This may be due to some drainage in the back of your throat affecting the eustachian tube. Recommend taking an antihistamine daily such as Zyrtec or Allegra and Sudafed PE 10  mg, decongestant one every 4 hours or 1-2 tablets a day depending on symptoms. Drink plenty of fluids and stay well-hydrated. For worsening sick medical attention.   Final Clinical Impressions(s) / UC Diagnoses   Final diagnoses:  ETD (Eustachian tube dysfunction), left    New Prescriptions New Prescriptions   No medications on file     Controlled Substance Prescriptions  Controlled Substance Registry consulted? Not Applicable   Hayden Rasmussen, NP 05/02/17 1734

## 2017-05-02 NOTE — Discharge Instructions (Signed)
The eardrum looks healthy. No redness or signs of infection. This may be due to some drainage in the back of your throat affecting the eustachian tube. Recommend taking an antihistamine daily such as Zyrtec or Allegra and Sudafed PE 10 mg, decongestant one every 4 hours or 1-2 tablets a day depending on symptoms. Drink plenty of fluids and stay well-hydrated. For worsening sick medical attention.

## 2017-05-03 ENCOUNTER — Telehealth (INDEPENDENT_AMBULATORY_CARE_PROVIDER_SITE_OTHER): Payer: Self-pay

## 2017-05-03 NOTE — Telephone Encounter (Signed)
Received notification from Dr. Lonie Peakram's office that pt is scheduled to see him on 05/10/17 @ [place] location

## 2017-06-06 DIAGNOSIS — Z79891 Long term (current) use of opiate analgesic: Secondary | ICD-10-CM | POA: Diagnosis not present

## 2017-06-23 ENCOUNTER — Telehealth (INDEPENDENT_AMBULATORY_CARE_PROVIDER_SITE_OTHER): Payer: Self-pay | Admitting: Physical Medicine and Rehabilitation

## 2017-06-23 NOTE — Telephone Encounter (Signed)
Received VM from patient requesting we fax her records to [facility]. I called her back and lmvm advising that we have to have her signed authorization. Asked she come by office to complete and sign medical records auth form so we can process her request for her.

## 2017-06-30 ENCOUNTER — Ambulatory Visit (INDEPENDENT_AMBULATORY_CARE_PROVIDER_SITE_OTHER): Payer: BLUE CROSS/BLUE SHIELD | Admitting: Specialist

## 2017-07-06 DIAGNOSIS — Z79891 Long term (current) use of opiate analgesic: Secondary | ICD-10-CM | POA: Diagnosis not present

## 2017-09-08 ENCOUNTER — Ambulatory Visit (INDEPENDENT_AMBULATORY_CARE_PROVIDER_SITE_OTHER): Payer: BLUE CROSS/BLUE SHIELD | Admitting: Specialist

## 2017-10-17 ENCOUNTER — Encounter (INDEPENDENT_AMBULATORY_CARE_PROVIDER_SITE_OTHER): Payer: Self-pay | Admitting: Specialist

## 2017-10-17 ENCOUNTER — Ambulatory Visit (INDEPENDENT_AMBULATORY_CARE_PROVIDER_SITE_OTHER): Payer: BLUE CROSS/BLUE SHIELD

## 2017-10-17 ENCOUNTER — Ambulatory Visit (INDEPENDENT_AMBULATORY_CARE_PROVIDER_SITE_OTHER): Payer: BLUE CROSS/BLUE SHIELD | Admitting: Specialist

## 2017-10-17 VITALS — BP 129/82 | HR 101 | Ht 64.0 in | Wt 170.0 lb

## 2017-10-17 DIAGNOSIS — M4316 Spondylolisthesis, lumbar region: Secondary | ICD-10-CM

## 2017-10-17 DIAGNOSIS — M5441 Lumbago with sciatica, right side: Secondary | ICD-10-CM

## 2017-10-17 DIAGNOSIS — G8929 Other chronic pain: Secondary | ICD-10-CM | POA: Diagnosis not present

## 2017-10-17 DIAGNOSIS — M48062 Spinal stenosis, lumbar region with neurogenic claudication: Secondary | ICD-10-CM

## 2017-10-17 MED ORDER — METHYLPREDNISOLONE 4 MG PO TBPK: ORAL_TABLET | ORAL | 0 refills | Status: DC

## 2017-10-17 NOTE — Patient Instructions (Signed)
Avoid bending, stooping and avoid lifting weights greater than 10 lbs. Avoid prolong standing and walking. Avoid frequent bending and stooping  No lifting greater than 10 lbs. May use ice or moist heat for pain. Weight loss is of benefit. Handicap license is approved. Dr. Newton's secretary/Assistant will call to arrange for epidural steroid injection  

## 2017-10-17 NOTE — Progress Notes (Signed)
Office Visit Note   Patient: Amy [name]           Date of Birth: 07/24/1966           MRN: [id number] Visit Date: 10/17/2017              Requested by: Tyrell AntonioNewton, Frederic, MD [address] [place], [place] [address] PCP: Patient, No Pcp Per   Assessment & Plan: Visit Diagnoses:  1. Chronic right-sided low back pain with right-sided sciatica   2. Spondylolisthesis, lumbar region     Plan: Avoid bending, stooping and avoid lifting weights greater than 10 lbs. Avoid prolong standing and walking. Avoid frequent bending and stooping  No lifting greater than 10 lbs. May use ice or moist heat for pain. Weight loss is of benefit. Handicap license is approved. Dr. Garrett BlasNewton's secretary/Assistant will call to arrange for epidural steroid injection   Follow-Up Instructions: Return in about 3 years (around 10/17/2020).   Orders:  Orders Placed This Encounter  Procedures  . XR Lumbar Spine 2-3 Views   No orders of the defined types were placed in this encounter.     Procedures: No procedures performed   Clinical Data: No additional findings.   Subjective: Chief Complaint  Patient presents with  . Lower Back - Follow-up, Pain    52 year old female with low back pain for a long time, for years worse with cold. It started 4-4.5 years ago when she fell in a shower. Her back hit the side of the tub and she had back pain. 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 She also notes numbness into the top of the right foot. Alleve helps to decrease the pain, reports that she then the medication stopped working. She stopped going to church, Feels drained. Bending, driving, sitting with pain. At night she gets up to a recliner and finds herself going back and forth to recliner, she sleeps no more than 5 hours, up all night every Hour. She is  going to the bathroom at night frequently and is concerned if she doesn't she would have an accident. No abdomenal pain or nausea. Has been told she has 2 brain aneursyms that are [facility]. Otherwise bowel and bladder are normal. Vacuuming and sweeping and folding cloths and doing the dishes increases the pain. Pain on  Scale of 1-10 the pain is an 8 or 9. Not at it's worst right now. Has aching and burning type pain. Severe pain then stiffness, then stiffness improves before the next episode.     Review of Systems  Constitutional: Negative.   HENT: Negative.   Eyes: Negative.   Respiratory: Negative.   Cardiovascular: Negative.   Gastrointestinal: Negative.   Endocrine: Negative.   Genitourinary: Negative.   Musculoskeletal: Negative.   Skin: Negative.   Allergic/Immunologic: Negative.   Neurological: Negative.   Hematological: Negative.   Psychiatric/Behavioral: Negative.      Objective: Vital Signs: BP 129/82 (BP Location: Left Arm, Patient Position: Sitting)   Pulse (!) 101   Ht 5\' 4"  (1.626 m)   Wt 170 lb (77.1 kg)   LMP 01/26/2000   BMI 29.18 kg/m   Physical Exam  Constitutional: She is oriented to person, place, and time. She appears well-developed and well-nourished.  HENT:  Head: Normocephalic and atraumatic.  Eyes: EOM  are normal. Pupils are equal, round, and reactive to light.  Neck: Normal range of motion. Neck supple.  Pulmonary/Chest: Effort normal and breath sounds normal.  Abdominal: Soft. Bowel sounds are normal.  Neurological: She is alert and oriented to person, place, and time.  Skin: Skin is warm and dry.  Psychiatric: She has a normal mood and affect. Her behavior is normal. Judgment and thought content normal.    Back Exam   Tenderness  The patient is experiencing tenderness in the lumbar.  Range of Motion  Extension: abnormal  Flexion:  60 abnormal  Lateral bend right: abnormal  Lateral bend left: abnormal  Rotation right:  abnormal  Rotation left: abnormal   Muscle Strength  Right Quadriceps:  5/5  Left Quadriceps:  5/5  Right Hamstrings:  5/5  Left Hamstrings:  5/5   Tests  Straight leg raise right: positive Straight leg raise left: negative  Reflexes  Patellar: normal Achilles: Hyporeflexic Biceps: normal Babinski's sign: normal   Other  Toe walk: normal Heel walk: normal Sensation: decreased Gait: normal  Erythema: no back redness Scars: absent  Comments:  Pain with figure of 4 bilaterally      Specialty Comments:  No specialty comments available.  Imaging: Xr Lumbar Spine 2-3 Views  Result Date: 10/17/2017 AP and lateral flexion and extension radiographs of the lumbar spine with grade 1 anterolisthesis L4-5 DDD L4-5 and mild L3-4    PMFS History: Patient Active Problem List   Diagnosis Date Noted  . Spondylolisthesis, lumbar region 01/18/2017  . Cocaine dependence (HCC) 07/25/2015  . 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/25/2015  . Hx of non anemic vitamin B12 deficiency 11/08/2014  . Brain aneurysm 09/09/2014  . Tobacco use disorder 07/22/2014  . Rhinitis, allergic 07/22/2014  . Breast pain, right 07/09/2014  . Chest pain, unspecified 07/02/2013  . Low back pain 01/24/2013  . Health care maintenance 10/16/2012  . Hot flashes 02/08/2012  . Post-surgical hypothyroidism 12/07/2011  . Fibromyalgia muscle pain 03/31/2011  . Migraine 03/31/2011  . Essential hypertension 11/14/2008  . DEPRESSION 08/10/2006   Past Medical History:  Diagnosis Date  . Anxiety   . Brain aneurysm   . Depression   . Grave's disease 2007   TSH (08/31/2010) = 0.186 (low), free T4 = 0.91 (WNL)  . Hypertension   . Postsurgical hypothyroidism   . Raynaud disease 2007    Family History  Problem Relation Age of Onset  . Colon cancer Maternal Grandfather   . Cancer Maternal Grandfather        prostate  . Hypertension Mother   . Thyroid disease Mother   . Lupus Mother   . Hypertension  Father     Past Surgical History:  Procedure Laterality Date  . ABDOMINAL HYSTERECTOMY    . COLONOSCOPY  02/14/2008   done by Dr. Christella Hartigan - small colonic polyp identified, 6 mm in size and per biopsy --> tubular adenoma with no malignancy or high grade dysplasia noted  . THYROIDECTOMY  03/08/2012   [facility]; history of Graves' disease with multinodular goiter  . WISDOM TOOTH EXTRACTION     Social History   Occupational History  . Not on file  Tobacco Use  . Smoking status: Current Every Day Smoker    Packs/day: 0.20    Years: 35.00    Pack years: 7.00    Types: Cigarettes    Last attempt to quit: 10/13/1996    Years since quitting: 21.0  . Smokeless tobacco: Never Used  Substance  and Sexual Activity  . Alcohol use: No    Alcohol/week: 0.0 oz  . Drug use: Yes    Types: Cocaine    Comment: went to treatment 07-2016 for crack, relapsed in 08-2017, none since.    . Sexual activity: Yes    Birth control/protection: Surgical    Comment: not in past two years

## 2017-11-01 ENCOUNTER — Encounter (INDEPENDENT_AMBULATORY_CARE_PROVIDER_SITE_OTHER): Payer: BLUE CROSS/BLUE SHIELD | Admitting: Physical Medicine and Rehabilitation

## 2017-11-10 ENCOUNTER — Encounter (INDEPENDENT_AMBULATORY_CARE_PROVIDER_SITE_OTHER): Payer: Self-pay | Admitting: Physical Medicine and Rehabilitation

## 2017-11-10 ENCOUNTER — Ambulatory Visit (INDEPENDENT_AMBULATORY_CARE_PROVIDER_SITE_OTHER): Payer: BLUE CROSS/BLUE SHIELD | Admitting: Physical Medicine and Rehabilitation

## 2017-11-10 ENCOUNTER — Ambulatory Visit (INDEPENDENT_AMBULATORY_CARE_PROVIDER_SITE_OTHER): Payer: Self-pay

## 2017-11-10 VITALS — BP 135/85 | HR 100 | Temp 98.0°F

## 2017-11-10 DIAGNOSIS — M5416 Radiculopathy, lumbar region: Secondary | ICD-10-CM | POA: Diagnosis not present

## 2017-11-10 MED ORDER — BETAMETHASONE SOD PHOS & ACET 6 (3-3) MG/ML IJ SUSP
12.0000 mg | Freq: Once | INTRAMUSCULAR | Status: AC
  Administered 2017-11-10: 12 mg

## 2017-11-10 NOTE — Progress Notes (Deleted)
Pt states a sharp constant pain in lower back mostly on the right side that radiates down right leg. Pt states numbness and tingling in her right leg that goes down to her right foot. Pt states pain in right groin also. Pt states pain has been going on for about 6 years and has gotten worse over the years. Pt states cold air, sitting for long periods of time. Pt states nothing makes pain better. +Driver, -BT, Dye Allergies.

## 2017-11-10 NOTE — Patient Instructions (Signed)

## 2017-11-17 NOTE — Procedures (Signed)
Lumbosacral Transforaminal Epidural Steroid Injection - Sub-Pedicular Approach with Fluoroscopic Guidance  Patient: Amy [name]      Date of Birth: 08/19/1966 MRN: [id number] PCP: Patient, No Pcp Per      Visit Date: 11/10/2017   Universal Protocol:    Date/Time: 11/10/2017  Consent Given By: the patient  Position: PRONE  Additional Comments: Vital signs were monitored before and after the procedure. Patient was prepped and draped in the usual sterile fashion. The correct patient, procedure, and site was verified.   Injection Procedure Details:  Procedure Site One Meds Administered:  Meds ordered this encounter  Medications  . betamethasone acetate-betamethasone sodium phosphate (CELESTONE) injection 12 mg    Laterality: Right  Location/Site:  L4-L5  Needle size: 20 G  Needle type: Spinal  Needle Placement: Transforaminal  Findings:    -Comments: Excellent flow of contrast along the nerve and into the epidural space.  Prior to even starting the injection the patient was very anxious.  She is actually had injections before and did fairly well.  Even with numbing the skin the first time she really had an exaggerated pain response.  With any needling into the muscle which is usually well-tolerated the patient had significant pain complaints.  We did asked the patient and she wanted to continue at that time and she did want to finish.  We did complete the injection really without any difficulty from a technical standpoint.  The patient did not want to complete the left-sided injection after the right side.  Consideration in the future would be preprocedure Valium versus referral to physician that can do this with sedation.  Procedure Details: After squaring off the end-plates to get a true AP view, the C-arm was positioned so that an oblique view of the foramen as noted above was visualized. The target area is just inferior to the "nose of the scotty dog" or sub pedicular.  The soft tissues overlying this structure were infiltrated with 2-3 ml. of 1% Lidocaine without Epinephrine.  The spinal needle was inserted toward the target using a "trajectory" view along the fluoroscope beam.  Under AP and lateral visualization, the needle was advanced so it did not puncture dura and was located close the 6 O'Clock position of the pedical in AP tracterory. Biplanar projections were used to confirm position. Aspiration was confirmed to be negative for CSF and/or blood. A 1-2 ml. volume of Isovue-250 was injected and flow of contrast was noted at each level. Radiographs were obtained for documentation purposes.   After attaining the desired flow of contrast documented above, a 0.5 to 1.0 ml test dose of 0.25% Marcaine was injected into each respective transforaminal space.  The patient was observed for 90 seconds post injection.  After no sensory deficits were reported, and normal lower extremity motor function was noted,   the above injectate was administered so that equal amounts of the injectate were placed at each foramen (level) into the transforaminal epidural space.   Additional Comments:  The patient tolerated the procedure well Dressing: Band-Aid    Post-procedure details: Patient was observed during the procedure. Post-procedure instructions were reviewed.  Patient left the clinic in stable condition.

## 2017-11-17 NOTE — Progress Notes (Addendum)
Amy [name] - 52 y.o. female MRN [id number]  Date of birth: 01/29/1966  Office Visit Note: Visit Date: 11/10/2017 PCP: Patient, No Pcp Per Referred by: Kerrin ChampagneNitka, James E, MD  Subjective: Chief Complaint  Patient presents with  . Lower Back - Pain  . Right Leg - Pain, Numbness, Tingling  . Right Foot - Pain, Numbness, Tingling   HPI: Amy [name] is a 52 year old female that I saw in June and July of last year and completed epidural injection and facet joint block at Dr. Barbaraann FasterNitka's request.  Those injections were somewhat beneficial at the time.  She reports ongoing low back pain particularly with right radicular type pain in the hip and leg.  She reports a lot of pain with standing and movement and better at rest but is still painful at rest.  She has not found anything that really helps very much at all.  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  Dr. Otelia SergeantNitka requested bilateral L4 transforaminal injections diagnostically and therapeutically.  MRI was reviewed again with the patient today.  Is reviewed below.    ROS Otherwise per HPI.  Assessment & Plan: Visit Diagnoses:  1. Lumbar radiculopathy     Plan: Findings:  We did complete a right L4 transforaminal epidural steroid injection with good flow of contrast into the epidural space at this level.  The patient was really wound up and really any movement of the needle or numbing the skin because exaggerated amount of pain.  After we finished the first injection the patient did not want to continue.  In the future if further injections were needed she probably would need at least preprocedure Valium.  Otherwise if sedation were needed we could refer her to someone in the area that does sedation injections.    Meds & Orders:  Meds ordered this encounter  Medications  . betamethasone acetate-betamethasone sodium phosphate (CELESTONE) injection 12 mg    Orders Placed This Encounter  Procedures  . XR C-ARM NO REPORT  . Epidural Steroid  injection    Follow-up: Return in about 2 weeks (around 11/24/2017) for Dr. Otelia SergeantNitka.   Procedures: No procedures performed  Lumbosacral Transforaminal Epidural Steroid Injection - Sub-Pedicular Approach with Fluoroscopic Guidance  Patient: Amy [name]      Date of Birth: 04/17/1966 MRN: [id number] PCP: Patient, No Pcp Per      Visit Date: 11/10/2017   Universal Protocol:    Date/Time: 11/10/2017  Consent Given By: the patient  Position: PRONE  Additional Comments: Vital signs were monitored before and after the procedure. Patient was prepped and draped in the usual sterile fashion. The correct patient, procedure, and site was verified.   Injection Procedure Details:  Procedure Site One Meds Administered:  Meds ordered this encounter  Medications  . betamethasone acetate-betamethasone sodium phosphate (CELESTONE) injection 12 mg    Laterality: Right  Location/Site:  L4-L5  Needle size: 20 G  Needle type: Spinal  Needle Placement: Transforaminal  Findings:    -Comments: Excellent flow of contrast along the nerve and into the epidural space.  Prior to even starting the injection the patient was very anxious.  She is actually had injections before and did fairly well.  Even with numbing the skin the first time she really had an exaggerated pain response.  With any needling into the muscle which is usually well-tolerated the patient had significant pain complaints.  We did asked the patient and she wanted to continue at that time and  she did want to finish.  We did complete the injection really without any difficulty from a technical standpoint.  The patient did not want to complete the left-sided injection after the right side.  Consideration in the future would be preprocedure Valium versus referral to physician that can do this with sedation.  Procedure Details: After squaring off the end-plates to get a true AP view, the C-arm was positioned so that an oblique view of  the foramen as noted above was visualized. The target area is just inferior to the "nose of the scotty dog" or sub pedicular. The soft tissues overlying this structure were infiltrated with 2-3 ml. of 1% Lidocaine without Epinephrine.  The spinal needle was inserted toward the target using a "trajectory" view along the fluoroscope beam.  Under AP and lateral visualization, the needle was advanced so it did not puncture dura and was located close the 6 O'Clock position of the pedical in AP tracterory. Biplanar projections were used to confirm position. Aspiration was confirmed to be negative for CSF and/or blood. A 1-2 ml. volume of Isovue-250 was injected and flow of contrast was noted at each level. Radiographs were obtained for documentation purposes.   After attaining the desired flow of contrast documented above, a 0.5 to 1.0 ml test dose of 0.25% Marcaine was injected into each respective transforaminal space.  The patient was observed for 90 seconds post injection.  After no sensory deficits were reported, and normal lower extremity motor function was noted,   the above injectate was administered so that equal amounts of the injectate were placed at each foramen (level) into the transforaminal epidural space.   Additional Comments:  The patient tolerated the procedure well Dressing: Band-Aid    Post-procedure details: Patient was observed during the procedure. Post-procedure instructions were reviewed.  Patient left the clinic in stable condition.    Clinical History: Lumbar spine MRI dated 02/21/2017  FINDINGS: Segmentation: Normal segmentation. Lowest well-formed disc is labeled the L5-S1 level.  Alignment: Trace 3 mm anterolisthesis of L4 on L5. Mild levoscoliosis. Vertebral bodies otherwise normally aligned with preservation of the normal lumbar lordosis.  Vertebrae: Vertebral body heights are well maintained. No evidence for acute or chronic fracture. Mild reactive edema  within the bilateral pedicles of L4 on L5 bilaterally due to facet degeneration. Signal intensity within the vertebral body bone marrow otherwise normal. No discrete or worrisome osseous lesion.  Conus medullaris: Extends to the T12 level and appears normal.  Paraspinal and other soft tissues: Paraspinous soft tissues within normal limits. Probable atheromatous disease noted within the intra-abdominal aorta without aneurysm. Visualized visceral structures grossly unremarkable.  Disc levels:  T12-L1: Seen only on sagittal projection. Mild disc bulge with disc desiccation. No stenosis.  L1-2:  Unremarkable.  L2-3: Normal interspace. Mild left-sided facet arthrosis. No significant canal or neural foraminal stenosis.  L3-4: Normal interspace. Mild bilateral facet arthrosis. No significant canal or neural foraminal stenosis.  L4-5: 3 mm anterolisthesis of L4 on L5. Diffuse degenerative disc bulge, slightly eccentric to the left. Advanced bilateral facet arthrosis with prominent reactive effusions within the bilateral L4-5 facets. Associated reactive marrow edema within the bilateral pedicles of L4 and L5 due to facet degeneration. Resultant moderate bilateral lateral recess narrowing, worse on the left, potentially affecting either of the descending L5 nerve roots. Moderate bilateral L4 foraminal stenosis due to disc bulge and facet disease, slightly worse on the right.  L5-S1: Shallow posterior disc bulge. No associated stenosis or evidence for neural impingement.  Mild right-sided facet degeneration. Epidural lipomatosis. No significant canal or foraminal stenosis.  IMPRESSION: 1. 3 mm anterolisthesis of L4 on L5 with associated disc bulge and advanced facet arthrosis, resulting in moderate lateral recess and foraminal stenosis bilaterally. Either of the L4 or L5 nerve roots could be affected bilaterally. 2. Shallow posterior disc bulge at L5-S1 without stenosis or  neural impingement. 3. Multilevel facet arthrosis extending from L2-3 through L5-S1 as above. Changes most notable at the L4-5 level were there is associated reactive marrow edema. Findings could serve as a source for lower back pain.  She reports that she has been smoking cigarettes.  She has a 7.00 pack-year smoking history. she has never used smokeless tobacco. No results for input(s): HGBA1C, LABURIC in the last 8760 hours.  Objective:  VS:  HT:    WT:   BMI:     BP:135/85  HR:100bpm  TEMP:98 F (36.7 C)(Oral)  RESP:99 % Physical Exam  Musculoskeletal:  Patient ambulates without aid she does have a forward flexed lumbar spine with ambulation.  She has good distal strength.    Ortho Exam Imaging: No results found.  Past Medical/Family/Surgical/Social History: Medications & Allergies reviewed per EMR Patient Active Problem List   Diagnosis Date Noted  . Spondylolisthesis, lumbar region 01/18/2017  . Cocaine dependence (HCC) 07/25/2015  . 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/25/2015  . Hx of non anemic vitamin B12 deficiency 11/08/2014  . Brain aneurysm 09/09/2014  . Tobacco use disorder 07/22/2014  . Rhinitis, allergic 07/22/2014  . Breast pain, right 07/09/2014  . Chest pain, unspecified 07/02/2013  . Low back pain 01/24/2013  . Health care maintenance 10/16/2012  . Hot flashes 02/08/2012  . Post-surgical hypothyroidism 12/07/2011  . Fibromyalgia muscle pain 03/31/2011  . Migraine 03/31/2011  . Essential hypertension 11/14/2008  . DEPRESSION 08/10/2006   Past Medical History:  Diagnosis Date  . Anxiety   . Brain aneurysm   . Depression   . Grave's disease 2007   TSH (08/31/2010) = 0.186 (low), free T4 = 0.91 (WNL)  . Hypertension   . Postsurgical hypothyroidism   . Raynaud disease 2007   Family History  Problem Relation Age of Onset  . Colon cancer Maternal Grandfather   . Cancer Maternal Grandfather        prostate  . Hypertension Mother   .  Thyroid disease Mother   . Lupus Mother   . Hypertension Father    Past Surgical History:  Procedure Laterality Date  . ABDOMINAL HYSTERECTOMY    . COLONOSCOPY  02/14/2008   done by Dr. Christella Hartigan - small colonic polyp identified, 6 mm in size and per biopsy --> tubular adenoma with no malignancy or high grade dysplasia noted  . THYROIDECTOMY  03/08/2012   [facility]; history of Graves' disease with multinodular goiter  . WISDOM TOOTH EXTRACTION     Social History   Occupational History  . Not on file  Tobacco Use  . Smoking status: Current Every Day Smoker    Packs/day: 0.20    Years: 35.00    Pack years: 7.00    Types: Cigarettes    Last attempt to quit: 10/13/1996    Years since quitting: 21.1  . Smokeless tobacco: Never Used  Substance and Sexual Activity  . Alcohol use: No    Alcohol/week: 0.0 oz  . Drug use: Yes    Types: Cocaine    Comment: went to treatment 07-2016 for crack, relapsed in 08-2017, none since.    . Sexual  activity: Yes    Birth control/protection: Surgical    Comment: not in past two years

## 2017-12-12 DIAGNOSIS — Z111 Encounter for screening for respiratory tuberculosis: Secondary | ICD-10-CM | POA: Diagnosis not present

## 2017-12-19 ENCOUNTER — Ambulatory Visit (INDEPENDENT_AMBULATORY_CARE_PROVIDER_SITE_OTHER): Payer: Self-pay | Admitting: Specialist

## 2018-01-02 ENCOUNTER — Ambulatory Visit (INDEPENDENT_AMBULATORY_CARE_PROVIDER_SITE_OTHER): Payer: BLUE CROSS/BLUE SHIELD | Admitting: Specialist

## 2018-01-02 ENCOUNTER — Encounter (INDEPENDENT_AMBULATORY_CARE_PROVIDER_SITE_OTHER): Payer: Self-pay | Admitting: Specialist

## 2018-01-02 VITALS — BP 159/100 | HR 98 | Temp 97.6°F | Ht 64.0 in | Wt 170.0 lb

## 2018-01-02 DIAGNOSIS — M48062 Spinal stenosis, lumbar region with neurogenic claudication: Secondary | ICD-10-CM | POA: Diagnosis not present

## 2018-01-02 DIAGNOSIS — M4316 Spondylolisthesis, lumbar region: Secondary | ICD-10-CM | POA: Diagnosis not present

## 2018-01-02 NOTE — Patient Instructions (Signed)
Avoid bending, stooping and avoid lifting weights greater than 10 lbs. Avoid prolong standing and walking. Order for a new walker with wheels. Surgery scheduling secretary Tivis RingerSherri Billings, will call you in the next week to schedule for surgery.  Surgery recommended is a one level lumbar fusion L4-5 this would be done with rods, screws and cages with local bone graft and allograft (donor bone graft). Take hydrocodone for for pain. Risk of surgery includes risk of infection 1 in 300 patients, bleeding 1/2% chance you would need a transfusion.   Risk to the nerves is one in 100,000. You will need to use a brace for 3 months and wean from the brace on the 4th month. Expect improved walking and standing tolerance. Expect relief of leg pain but numbness may persist depending on the length and degree of pressure that has been present.

## 2018-01-02 NOTE — Progress Notes (Signed)
Office Visit Note   Patient: Amy [name]           Date of Birth: September 09, 1966           MRN: [id number] Visit Date: 01/02/2018              Requested by: No referring provider defined for this encounter. PCP: Patient, No Pcp Per   Assessment & Plan: Visit Diagnoses:  1. Spinal stenosis of lumbar region with neurogenic claudication   2. Spondylolisthesis at L4-L5 level     Plan: Avoid bending, stooping and avoid lifting weights greater than 10 lbs. Avoid prolong standing and walking. Order for a new walker with wheels. Surgery scheduling secretary Tivis Ringer, will call you in the next week to schedule for surgery.  Surgery recommended is a one level lumbar fusion L4-5 this would be done with rods, screws and cages with local bone graft and allograft (donor bone graft). Take hydrocodone for for pain. Risk of surgery includes risk of infection 1 in 300 patients, bleeding 1/2% chance you would need a transfusion.   Risk to the nerves is one in 100,000. You will need to use a brace for 3 months and wean from the brace on the 4th month. Expect improved walking and standing tolerance. Expect relief of leg pain but numbness may persist depending on the length and degree of pressure that has been present  Follow-Up Instructions: Return in about 1 month (around 01/30/2018).   Orders:  No orders of the defined types were placed in this encounter.  No orders of the defined types were placed in this encounter.     Procedures: No procedures performed   Clinical Data: No additional findings.   Subjective: Chief Complaint  Patient presents with  . Lower Back - Follow-up    52 year old female with history of back pain and radiation intothe right leg greater than left. Worse with standing and walking. Improves with sitting and stooping. Complains of foot numbness and buttock pain with prolong standing. No bowel or bladder difficulty. Able to walk a short distance than has to  sit down.    Review of Systems  Constitutional: Negative.   HENT: Negative.   Eyes: Negative.   Respiratory: Negative.   Cardiovascular: Negative.   Gastrointestinal: Negative.   Endocrine: Negative.   Genitourinary: Negative.   Musculoskeletal: Negative.   Skin: Negative.   Allergic/Immunologic: Negative.   Neurological: Negative.   Hematological: Negative.   Psychiatric/Behavioral: Negative.      Objective: Vital Signs: BP (!) 159/100   Pulse 98   Temp 97.6 F (36.4 C)   Ht 5\' 4"  (1.626 m)   Wt 170 lb (77.1 kg)   LMP 01/26/2000   BMI 29.18 kg/m   Physical Exam  Constitutional: She is oriented to person, place, and time. She appears well-developed and well-nourished.  HENT:  Head: Normocephalic and atraumatic.  Eyes: Pupils are equal, round, and reactive to light. EOM are normal.  Neck: Normal range of motion. Neck supple.  Pulmonary/Chest: Effort normal and breath sounds normal.  Abdominal: Soft. Bowel sounds are normal.  Neurological: She is alert and oriented to person, place, and time.  Skin: Skin is warm and dry.  Psychiatric: She has a normal mood and affect. Her behavior is normal. Judgment and thought content normal.    Back Exam   Tenderness  The patient is experiencing tenderness in the lumbar.  Range of Motion  Extension: abnormal  Flexion: normal  Lateral  bend right: abnormal  Lateral bend left: abnormal  Rotation right: abnormal  Rotation left: abnormal   Muscle Strength  Right Quadriceps:  5/5  Left Quadriceps:  5/5  Right Hamstrings:  5/5  Left Hamstrings:  5/5   Tests  Straight leg raise right: negative Straight leg raise left: negative  Reflexes  Patellar: normal Achilles: normal Babinski's sign: normal   Other  Toe walk: normal Heel walk: normal Sensation: normal Gait: normal  Erythema: no back redness Scars: absent      Specialty Comments:  No specialty comments available.  Imaging: No results  found.   PMFS History: Patient Active Problem List   Diagnosis Date Noted  . Spondylolisthesis, lumbar region 01/18/2017  . Cocaine dependence (HCC) 07/25/2015  . 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/25/2015  . Hx of non anemic vitamin B12 deficiency 11/08/2014  . Brain aneurysm 09/09/2014  . Tobacco use disorder 07/22/2014  . Rhinitis, allergic 07/22/2014  . Breast pain, right 07/09/2014  . Chest pain, unspecified 07/02/2013  . Low back pain 01/24/2013  . Health care maintenance 10/16/2012  . Hot flashes 02/08/2012  . Post-surgical hypothyroidism 12/07/2011  . Fibromyalgia muscle pain 03/31/2011  . Migraine 03/31/2011  . Essential hypertension 11/14/2008  . DEPRESSION 08/10/2006   Past Medical History:  Diagnosis Date  . Anxiety   . Brain aneurysm   . Depression   . Grave's disease 2007   TSH (08/31/2010) = 0.186 (low), free T4 = 0.91 (WNL)  . Hypertension   . Postsurgical hypothyroidism   . Raynaud disease 2007    Family History  Problem Relation Age of Onset  . Colon cancer Maternal Grandfather   . Cancer Maternal Grandfather        prostate  . Hypertension Mother   . Thyroid disease Mother   . Lupus Mother   . Hypertension Father     Past Surgical History:  Procedure Laterality Date  . ABDOMINAL HYSTERECTOMY    . COLONOSCOPY  02/14/2008   done by Dr. Christella HartiganJacobs - small colonic polyp identified, 6 mm in size and per biopsy --> tubular adenoma with no malignancy or high grade dysplasia noted  . [facility]  [facility]; history of Graves' disease with multinodular goiter  . WISDOM TOOTH EXTRACTION     Social History   Occupational History  . Not on file  Tobacco Use  . Smoking status: Current Every Day Smoker    Packs/day: 0.20    Years: 35.00    Pack years: 7.00    Types: Cigarettes    Last attempt to quit: 10/13/1996    Years since quitting: 21.2  . Smokeless tobacco: Never Used  Substance and Sexual Activity  . Alcohol use: No     Alcohol/week: 0.0 oz  . Drug use: Yes    Types: Cocaine    Comment: went to treatment 07-2016 for crack, relapsed in 08-2017, none since.    . Sexual activity: Yes    Birth control/protection: Surgical    Comment: not in past two years

## 2018-01-03 ENCOUNTER — Telehealth (INDEPENDENT_AMBULATORY_CARE_PROVIDER_SITE_OTHER): Payer: Self-pay | Admitting: Specialist

## 2018-01-03 NOTE — Telephone Encounter (Signed)
Patient called needing something for pain. Patient advised she is in a lot of pain. The number to contact patient is [phone] Patient advised [facility] [place] [facility] [place]

## 2018-01-03 NOTE — Telephone Encounter (Signed)
Patient called needing something for pain. Patient advised she is in a lot of pain. The number to contact patient is [phone] Patient advised she uses the [facility] [place] [facility] [place]   

## 2018-01-10 ENCOUNTER — Telehealth (INDEPENDENT_AMBULATORY_CARE_PROVIDER_SITE_OTHER): Payer: Self-pay | Admitting: Specialist

## 2018-01-10 NOTE — Telephone Encounter (Signed)
Patient called back concerning needing something for pain. Patient advised she is in a lot of pain.  See previous message. The number to contact patient is [phone]

## 2018-01-10 NOTE — Telephone Encounter (Signed)
Patient called back concerning needing something for pain. Patient advised she is in a lot of pain.  See previous message.

## 2018-01-12 ENCOUNTER — Telehealth (INDEPENDENT_AMBULATORY_CARE_PROVIDER_SITE_OTHER): Payer: Self-pay

## 2018-01-12 NOTE — Telephone Encounter (Signed)
Patient left voice mail inquiring about scheduling surgery.  Amy [name] needs to let her employer know about the date.  I called and advised that I will need to request surgery order from Dr. Otelia SergeantNitka before I can schedule.  Please write order.  Thanks!

## 2018-01-18 DIAGNOSIS — R942 Abnormal results of pulmonary function studies: Secondary | ICD-10-CM

## 2018-01-18 HISTORY — DX: Abnormal results of pulmonary function studies: R94.2

## 2018-01-20 ENCOUNTER — Other Ambulatory Visit (INDEPENDENT_AMBULATORY_CARE_PROVIDER_SITE_OTHER): Payer: Self-pay | Admitting: Specialist

## 2018-01-20 MED ORDER — OXYCODONE-ACETAMINOPHEN 5-325 MG PO TABS: 1.0000 | ORAL_TABLET | Freq: Four times a day (QID) | ORAL | 0 refills | Status: AC | PRN

## 2018-01-20 MED ORDER — OXYCODONE-ACETAMINOPHEN 5-325 MG PO TABS: 2.0000 | ORAL_TABLET | Freq: Four times a day (QID) | ORAL | 0 refills | Status: DC | PRN

## 2018-01-20 NOTE — Telephone Encounter (Signed)
Patient called needing something for pain. Patient said the Amy [name] is no longer working. Patient said she uses the [place] [facility]. Patient said the pain is really bad. Patient said she is going to be set up to have surgery. The number to contact patient is [phone]

## 2018-01-20 NOTE — Telephone Encounter (Signed)
Patient has called numerous times to get an Rx for pain.  Please advise.

## 2018-01-20 NOTE — Telephone Encounter (Signed)
She is to be scheduled for surgery and the blue sheet is filled out today, Rx for medication for discomfort printed and signed. jen

## 2018-01-20 NOTE — Telephone Encounter (Signed)
Dr. Otelia Sergeant printed script. I called patient and advised. Advised that she would need to pick up in the office prior to 5p today to have medication for the weekend.

## 2018-01-23 DIAGNOSIS — I480 Paroxysmal atrial fibrillation: Secondary | ICD-10-CM | POA: Diagnosis not present

## 2018-01-23 DIAGNOSIS — R0789 Other chest pain: Secondary | ICD-10-CM | POA: Diagnosis not present

## 2018-01-23 DIAGNOSIS — I10 Essential (primary) hypertension: Secondary | ICD-10-CM | POA: Diagnosis not present

## 2018-01-23 DIAGNOSIS — F419 Anxiety disorder, unspecified: Secondary | ICD-10-CM | POA: Diagnosis not present

## 2018-01-25 DIAGNOSIS — E785 Hyperlipidemia, unspecified: Secondary | ICD-10-CM | POA: Diagnosis not present

## 2018-01-25 DIAGNOSIS — E039 Hypothyroidism, unspecified: Secondary | ICD-10-CM | POA: Diagnosis not present

## 2018-01-25 DIAGNOSIS — I10 Essential (primary) hypertension: Secondary | ICD-10-CM | POA: Diagnosis not present

## 2018-01-27 ENCOUNTER — Institutional Professional Consult (permissible substitution): Payer: BLUE CROSS/BLUE SHIELD | Admitting: Medical

## 2018-02-01 ENCOUNTER — Telehealth (INDEPENDENT_AMBULATORY_CARE_PROVIDER_SITE_OTHER): Payer: Self-pay | Admitting: Specialist

## 2018-02-01 NOTE — Telephone Encounter (Signed)
Patient requesting rx refill on percocet.

## 2018-02-01 NOTE — Telephone Encounter (Signed)
Patient requesting rx refill on percocet. # [id number]

## 2018-02-01 NOTE — Telephone Encounter (Signed)
Patient called needing something for pain. Patient said she is hurting really bad and have been up all night. The number to contact patient is [phone]

## 2018-02-01 NOTE — Telephone Encounter (Signed)
Duplicate message, I have sent a request to Dr. Otelia Sergeant already

## 2018-02-02 ENCOUNTER — Other Ambulatory Visit (INDEPENDENT_AMBULATORY_CARE_PROVIDER_SITE_OTHER): Payer: Self-pay | Admitting: Specialist

## 2018-02-02 MED ORDER — OXYCODONE-ACETAMINOPHEN 5-325 MG PO TABS: 1.0000 | ORAL_TABLET | ORAL | 0 refills | Status: DC | PRN

## 2018-02-02 NOTE — Telephone Encounter (Signed)
Pt is in a lot of pain called to check on med refill

## 2018-02-02 NOTE — Telephone Encounter (Signed)
Rx for percocet printed and signed. She will need to pick up. jen

## 2018-02-02 NOTE — Telephone Encounter (Signed)
Rx for percocet refill printed and signed for her to pick up tomorrow. jen

## 2018-02-02 NOTE — Telephone Encounter (Signed)
Patient called needing something for pain. Patient said she is hurting really bad and have been up all night.

## 2018-02-03 ENCOUNTER — Other Ambulatory Visit: Payer: Self-pay

## 2018-02-03 ENCOUNTER — Encounter: Payer: Self-pay | Admitting: Medical

## 2018-02-03 ENCOUNTER — Ambulatory Visit: Payer: BLUE CROSS/BLUE SHIELD | Admitting: Medical

## 2018-02-03 VITALS — BP 130/88 | HR 91 | Ht 64.5 in | Wt 165.2 lb

## 2018-02-03 DIAGNOSIS — M545 Low back pain: Secondary | ICD-10-CM | POA: Diagnosis not present

## 2018-02-03 DIAGNOSIS — Z01818 Encounter for other preprocedural examination: Secondary | ICD-10-CM | POA: Diagnosis not present

## 2018-02-03 DIAGNOSIS — Z23 Encounter for immunization: Secondary | ICD-10-CM | POA: Diagnosis not present

## 2018-02-03 DIAGNOSIS — E039 Hypothyroidism, unspecified: Secondary | ICD-10-CM | POA: Diagnosis not present

## 2018-02-03 DIAGNOSIS — R942 Abnormal results of pulmonary function studies: Secondary | ICD-10-CM | POA: Insufficient documentation

## 2018-02-03 DIAGNOSIS — I10 Essential (primary) hypertension: Secondary | ICD-10-CM | POA: Diagnosis not present

## 2018-02-03 DIAGNOSIS — F17200 Nicotine dependence, unspecified, uncomplicated: Secondary | ICD-10-CM | POA: Diagnosis not present

## 2018-02-03 DIAGNOSIS — G8929 Other chronic pain: Secondary | ICD-10-CM

## 2018-02-03 MED ORDER — FLUTICASONE FUROATE-VILANTEROL 100-25 MCG/INH IN AEPB: 1.0000 | INHALATION_SPRAY | Freq: Every day | RESPIRATORY_TRACT | 0 refills | Status: DC

## 2018-02-03 NOTE — Progress Notes (Signed)
Subjective:   HPI  Amy [name] is a 52 y.o. female who presents for Chief Complaint  Patient presents with  . New Patient (Initial Visit)    SURGICAL CLEARANCE     Medical care team includes: Maisen Schmit, Kermit Balo, PA-C here for primary care establishing care today. Dentist Eye doctor Dr. Sharyn Lull, cardiology Dr. Otelia Sergeant  Concerns: Here as a new patient for surgery clearance.   Having back surgery with Dr. Otelia Sergeant soon.  In the meantime she had cardiac eval last week including labs and EKG  She has no other c/o.  She does note some SOB with activity.  Long term smoker.  No prior use of inhaler.   Reviewed their medical, surgical, family, social, medication, and allergy history and updated chart as appropriate.  Past Medical History:  Diagnosis Date  . Abnormal PFT 01/2018  . Anxiety   . Arthritis   . Brain aneurysm   . Chronic back pain   . Depression    history of  . Grave's disease 2007   TSH (08/31/2010) = 0.186 (low), free T4 = 0.91 (WNL)  . History of substance abuse    remote past  . Hypertension    cardiology consult 01/2018, Dr. Sharyn Lull  . Postsurgical hypothyroidism   . Raynaud disease 2007  . Smoker     Past Surgical History:  Procedure Laterality Date  . ABDOMINAL HYSTERECTOMY     due to fibroids, still has ovaries  . COLONOSCOPY  02/14/2008   done by Dr. Christella Hartigan - small colonic polyp identified, 6 mm in size and per biopsy --> tubular adenoma with no malignancy or high grade dysplasia noted  . THYROIDECTOMY  03/08/2012   [facility]; history of Graves' disease with multinodular goiter  . TONSILLECTOMY    . WISDOM TOOTH EXTRACTION      Social History   Socioeconomic History  . Marital status: Single    Spouse name: Not on file  . Number of children: Not on file  . Years of education: Not on file  . Highest education level: Not on file  Occupational History  . Not on file  Social Needs  . Financial resource strain: Not on file  . Food  insecurity:    Worry: Not on file    Inability: Not on file  . Transportation needs:    Medical: Not on file    Non-medical: Not on file  Tobacco Use  . Smoking status: Current Every Day Smoker    Packs/day: 0.20    Years: 35.00    Pack years: 7.00    Types: Cigarettes    Last attempt to quit: 10/13/1996    Years since quitting: 21.3  . Smokeless tobacco: Never Used  Substance and Sexual Activity  . Alcohol use: No    Alcohol/week: 0.0 oz    Comment: sober for several years  . Drug use: Not Currently    Types: Cocaine    Comment: went to treatment 07-2016 for crack, relapsed in 08-2017, none since.    . Sexual activity: Not on file  Lifestyle  . Physical activity:    Days per week: Not on file    Minutes per session: Not on file  . Stress: Not on file  Relationships  . Social connections:    Talks on phone: Not on file    Gets together: Not on file    Attends religious service: Not on file    Active member of club or organization: Not on file  Attends meetings of clubs or organizations: Not on file    Relationship status: Not on file  . Intimate partner violence:    Fear of current or ex partner: Not on file    Emotionally abused: Not on file    Physically abused: Not on file    Forced sexual activity: Not on file  Other Topics Concern  . Not on file  Social History Narrative   Lives with grandson.   Works as Psychologist, clinical.  01/2018.     Financial assistance approved for 80% discount at [facility] and has [facility] card per Rudell Cobb   07/24/2010       Family History  Problem Relation Age of Onset  . Colon cancer Maternal Grandfather   . Cancer Maternal Grandfather        prostate  . Hypertension Mother   . Thyroid disease Mother   . Lupus Mother   . Hypertension Father      Current Outpatient Medications:  .  amitriptyline (ELAVIL) 100 MG tablet, Take 100 mg by mouth at bedtime., Disp: , Rfl:  .  hydrochlorothiazide (MICROZIDE) 12.5 MG capsule, Take 12.5 mg  by mouth daily., Disp: , Rfl: 11 .  levothyroxine (SYNTHROID, LEVOTHROID) 150 MCG tablet, Take 150 mcg by mouth daily., Disp: , Rfl: 11 .  losartan (COZAAR) 50 MG tablet, Take 50 mg by mouth daily., Disp: , Rfl: 3 .  metoprolol succinate (TOPROL-XL) 50 MG 24 hr tablet, Take 50 mg by mouth daily., Disp: , Rfl: 3 .  oxyCODONE-acetaminophen (PERCOCET/ROXICET) 5-325 MG tablet, Take 1 tablet by mouth every 4 (four) hours as needed for severe pain., Disp: 40 tablet, Rfl: 0 .  fluticasone furoate-vilanterol (BREO ELLIPTA) 100-25 MCG/INH AEPB, Inhale 1 puff into the lungs daily., Disp: 28 each, Rfl: 0  Allergies  Allergen Reactions  . Adhesive [Tape] Other (See Comments)    Pulls skin off.     Review of Systems Constitutional: -fever, -chills, -sweats, -unexpected weight change, -decreased appetite, -fatigue Allergy: -sneezing, -itching, -congestion Dermatology: -changing moles, --rash, -lumps ENT: -runny nose, -ear pain, -sore throat, -hoarseness, -sinus pain, -teeth pain, - ringing in ears, -hearing loss, -nosebleeds Cardiology: -chest pain, -palpitations, -swelling, -difficulty breathing when lying flat, -waking up short of breath Respiratory: -cough, -shortness of breath, +difficulty breathing with exercise or exertion, -wheezing, -coughing up blood Gastroenterology: -abdominal pain, -nausea, -vomiting, -diarrhea, -constipation, -blood in stool, -changes in bowel movement, -difficulty swallowing or eating Hematology: -bleeding, -bruising  Musculoskeletal: -joint aches, -muscle aches, -joint swelling, -back pain, -neck pain, -cramping, -changes in gait Ophthalmology: denies vision changes, eye redness, itching, discharge Urology: -burning with urination, -difficulty urinating, -blood in urine, -urinary frequency, -urgency, -incontinence Neurology: -headache, -weakness, -tingling, -numbness, -memory loss, -falls, -dizziness Psychology: -depressed mood, -agitation, -sleep problems Breast/gyn:  -breast tenderness, -discharge, -lumps, -vaginal discharge,- irregular periods, -heavy periods     Objective:  BP 130/88   Pulse 91   Ht 5' 4.5" (1.638 m)   Wt 165 lb 3.2 oz (74.9 kg)   LMP 01/26/2000   SpO2 99%   BMI 27.92 kg/m   General appearance: alert, no distress, WD/WN, African American female, very pleasant Skin: unremarkable HEENT: normocephalic, conjunctiva/corneas normal, sclerae anicteric, PERRLA, EOMi, nares patent, no discharge or erythema, pharynx normal Oral cavity: MMM, tongue normal, teeth in good repair Neck: supple, no lymphadenopathy, no thyromegaly, no masses, normal ROM, no bruits, anterior lower surgical scar Chest: non tender, normal shape and expansion Heart: RRR, normal S1, S2, no murmurs Lungs: CTA  bilaterally, no wheezes, rhonchi, or rales Abdomen: +bs, soft, non tender, non distended, no masses, no hepatomegaly, no splenomegaly, no bruits Back: lower paraspinal tenderness, otherwise non tender, no scoliosis Musculoskeletal: upper extremities non tender, no obvious deformity, normal ROM throughout, lower extremities non tender, no obvious deformity, normal ROM throughout Extremities: no edema, no cyanosis, no clubbing Pulses: 2+ symmetric, upper and lower extremities, normal cap refill Neurological: alert, oriented x 3, CN2-12 intact, strength normal upper extremities and lower extremities, sensation normal throughout, DTRs 2+ throughout, no cerebellar signs, gait normal Psychiatric: normal affect, behavior normal, pleasant  Breast/gyn/rectal - deferred    Assessment and Plan :   Encounter Diagnoses  Name Primary?  . Preop examination Yes  . Need for Tdap vaccination   . Smoker   . Chronic low back pain, unspecified back pain laterality, with sciatica presence unspecified   . Abnormal PFT   . Essential hypertension   . Hypothyroidism, unspecified type     Physical exam - discussed and counseled on healthy lifestyle, diet, exercise, preventative  care, vaccinations, sick and well care, proper use of emergency dept and after hours care, and addressed their concerns.    We will request records from the recent cardiac evaluation and labs from Dr. Sharyn Lull in the past 2 weeks  PFT was abnormal today suggesting COPD given long hx/o tobacco use since age 3yo.   We will send for chest xray.   Begin trial of Breo inhaler 1 puff daily.  Sample given.   F/u with call back next week on this.   Counseled on the Tdap (tetanus, diptheria, and acellular pertussis) vaccine.  Vaccine information sheet given. Tdap vaccine given after consent obtained.  We can see her back in follow up to discuss routine screenings, preventative care.   This was a surgery clearance today.     Riona was seen today for new patient (initial visit).  Diagnoses and all orders for this visit:  Preop examination  Need for Tdap vaccination  Smoker -     Cancel: Pulmonary function test; Future -     Spirometry with graph -     DG Chest 2 View; Future  Chronic low back pain, unspecified back pain laterality, with sciatica presence unspecified  Abnormal PFT -     DG Chest 2 View; Future  Essential hypertension  Hypothyroidism, unspecified type  Other orders -     Tdap vaccine greater than or equal to 7yo IM -     fluticasone furoate-vilanterol (BREO ELLIPTA) 100-25 MCG/INH AEPB; Inhale 1 puff into the lungs daily.   Follow-up pending labs, yearly for physical

## 2018-02-03 NOTE — Telephone Encounter (Signed)
Pt is aware her rx is ready for pick up at the front desk 

## 2018-02-03 NOTE — Telephone Encounter (Signed)
Pt is aware her rx is ready for pick up 

## 2018-02-06 ENCOUNTER — Ambulatory Visit
Admission: RE | Admit: 2018-02-06 | Discharge: 2018-02-06 | Disposition: A | Discharge status: Home / Self Care | Payer: BLUE CROSS/BLUE SHIELD | Source: Ambulatory Visit | Attending: Medical | Admitting: Medical

## 2018-02-06 DIAGNOSIS — F17200 Nicotine dependence, unspecified, uncomplicated: Secondary | ICD-10-CM

## 2018-02-06 DIAGNOSIS — R942 Abnormal results of pulmonary function studies: Secondary | ICD-10-CM | POA: Diagnosis not present

## 2018-02-07 ENCOUNTER — Telehealth: Payer: Self-pay | Admitting: Family Medicine

## 2018-02-07 NOTE — Telephone Encounter (Signed)
Pt called for xray results gave same.  Advised awaiting cardiology results.  She states she will call them right now and get that to [place].

## 2018-02-08 ENCOUNTER — Other Ambulatory Visit: Payer: Self-pay | Admitting: Medical

## 2018-02-08 ENCOUNTER — Telehealth: Payer: Self-pay | Admitting: Medical

## 2018-02-08 MED ORDER — FLUTICASONE FUROATE-VILANTEROL 100-25 MCG/INH IN AEPB: 1.0000 | INHALATION_SPRAY | Freq: Every day | RESPIRATORY_TRACT | 5 refills | Status: DC

## 2018-02-08 NOTE — Telephone Encounter (Signed)
Faxed

## 2018-02-08 NOTE — Telephone Encounter (Signed)
[facility], fax form for surgery clearance  FYI  I called and spoke to patient.  She has seen an improvement on Breo inhaler.  I reviewed her labs and cardiac notes from Dr. Sharyn Lull from 01/23/2018.  We will go ahead and send over clearance for surgery.  I will see her in follow-up at her convenience regarding repeat thyroid labs and adjustment of thyroid medicine

## 2018-02-09 ENCOUNTER — Ambulatory Visit (INDEPENDENT_AMBULATORY_CARE_PROVIDER_SITE_OTHER): Payer: BLUE CROSS/BLUE SHIELD | Admitting: Specialist

## 2018-02-09 ENCOUNTER — Encounter (INDEPENDENT_AMBULATORY_CARE_PROVIDER_SITE_OTHER): Payer: Self-pay

## 2018-02-09 ENCOUNTER — Encounter (INDEPENDENT_AMBULATORY_CARE_PROVIDER_SITE_OTHER): Payer: Self-pay | Admitting: Specialist

## 2018-02-09 DIAGNOSIS — Z5321 Procedure and treatment not carried out due to patient leaving prior to being seen by health care provider: Secondary | ICD-10-CM

## 2018-02-09 NOTE — Progress Notes (Signed)
Patient left without being seen.

## 2018-02-10 ENCOUNTER — Encounter: Payer: Self-pay | Admitting: Medical

## 2018-02-12 ENCOUNTER — Telehealth: Payer: Self-pay | Admitting: Medical

## 2018-02-12 NOTE — Telephone Encounter (Signed)
P.A. BREO ELLIPTA 

## 2018-02-15 ENCOUNTER — Other Ambulatory Visit (INDEPENDENT_AMBULATORY_CARE_PROVIDER_SITE_OTHER): Payer: Self-pay | Admitting: Specialist

## 2018-02-15 NOTE — Telephone Encounter (Signed)
Patient called requesting an RX for some pain medication.  She stated that she is still waiting for Cordelia Pen to schedule her surgery.  CB#[phone].  Thank you.

## 2018-02-15 NOTE — Telephone Encounter (Signed)
Sent rx request to Dr. Nitka 

## 2018-02-16 ENCOUNTER — Telehealth: Payer: Self-pay | Admitting: Medical

## 2018-02-16 MED ORDER — OXYCODONE-ACETAMINOPHEN 5-325 MG PO TABS: 1.0000 | ORAL_TABLET | ORAL | 0 refills | Status: DC | PRN

## 2018-02-16 NOTE — Telephone Encounter (Signed)
Oxycodone refill request.

## 2018-02-16 NOTE — Telephone Encounter (Signed)
Received requested info from Dr. Harwani. Sending back for review.  °

## 2018-02-16 NOTE — Telephone Encounter (Signed)
Oxycodone-acetaminophen (Percocet/Roxicet)5-325 mg tablet

## 2018-02-17 NOTE — Telephone Encounter (Signed)
I called and lmom for pt that her Rx is ready for pick up at the front desk

## 2018-02-21 NOTE — Pre-Procedure Instructions (Signed)
Amy [name]  02/21/2018      [facility] [address] [place], [address] [place] [address] Phone: [phone] Fax: [phone]  [facility] [address] [place], [place] - [address] [place] [address] Phone: [phone] Fax: [phone]  [facility] [place], [place] - [address] [place] [address] Phone: [phone] Fax: [phone]  [facility] - [place], [place] - [address] [place] [address] Phone: [phone] Fax: [phone]    Your procedure is scheduled on Monday June 10.  Report to [facility] Admitting at 5:30 A.M.  Call this number if you have problems the morning of surgery:  [phone]   Remember:  No food or drink after midnight the night before surgery.      Take these medicines the morning of surgery with A SIP OF WATER:   Metoprolol (Toprol-XL) Levothyroxine (synthroid) BREO Ellipta Oxycodone (Percocet) if needed  7 days prior to surgery STOP taking any Aspirin(unless otherwise instructed by your surgeon), Aleve, Naproxen, Ibuprofen, Motrin, Advil, Goody's, BC's, all herbal medications, fish oil, and all vitamins     Do not wear jewelry, make-up or nail polish.  Do not wear lotions, powders, or perfumes, or deodorant.  Do not shave 48 hours prior to surgery.  Men may shave face and neck.  Do not bring valuables to the hospital.  [facility] is not responsible for any belongings or valuables.  Contacts, dentures or bridgework may not be worn into surgery.  Leave your suitcase in the car.  After surgery it may be brought to your room.  For patients admitted to the hospital, discharge time will be determined by your treatment team.  Patients discharged the day of surgery will not be allowed to drive home.   Special instructions:    [facility]- Preparing For  Surgery  Before surgery, you can play an important role. Because skin is not sterile, your skin needs to be as free of germs as possible. You can reduce the number of germs on your skin by washing with CHG (chlorahexidine gluconate) Soap before surgery.  CHG is an antiseptic cleaner which kills germs and bonds with the skin to continue killing germs even after washing.    Oral Hygiene is also important to reduce your risk of infection.  Remember - BRUSH YOUR TEETH THE MORNING OF SURGERY WITH YOUR REGULAR TOOTHPASTE  Please do not use if you have an allergy to CHG or antibacterial soaps. If your skin becomes reddened/irritated stop using the CHG.  Do not shave (including legs and underarms) for at least 48 hours prior to first CHG shower. It is OK to shave your face.  Please follow these instructions carefully.   1. Shower the NIGHT BEFORE SURGERY and the MORNING OF SURGERY with CHG.   2. If you chose to wash your hair, wash your hair first as usual with your normal shampoo.  3. After you shampoo, rinse your hair and body thoroughly to remove the shampoo.  4. Use CHG as you would any other liquid soap. You can apply CHG directly to the skin and wash gently with a scrungie or a clean washcloth.   5. Apply the CHG Soap to your body ONLY FROM THE NECK DOWN.  Do not use on open wounds or open sores. Avoid contact with your eyes, ears, mouth and genitals (private parts). Wash Face  and genitals (private parts)  with your normal soap.  6. Wash thoroughly, paying special attention to the area where your surgery will be performed.  7. Thoroughly rinse your body with warm water from the neck down.  8. DO NOT shower/wash with your normal soap after using and rinsing off the CHG Soap.  9. Pat yourself dry with a CLEAN TOWEL.  10. Wear CLEAN PAJAMAS to bed the night before surgery, wear comfortable clothes the morning of surgery  11. Place CLEAN SHEETS on your bed the night of your first shower and  DO NOT SLEEP WITH PETS.    Day of Surgery:  Do not apply any deodorants/lotions.  Please wear clean clothes to the hospital/surgery center.   Remember to brush your teeth WITH YOUR REGULAR TOOTHPASTE.    Please read over the following fact sheets that you were given. Coughing and Deep Breathing, MRSA Information and Surgical Site Infection Prevention

## 2018-02-22 ENCOUNTER — Encounter (HOSPITAL_COMMUNITY)
Admission: RE | Admit: 2018-02-22 | Discharge: 2018-02-22 | Disposition: A | Discharge status: Home / Self Care | Payer: BLUE CROSS/BLUE SHIELD | Source: Ambulatory Visit | Attending: Specialist | Admitting: Specialist

## 2018-02-22 ENCOUNTER — Encounter (INDEPENDENT_AMBULATORY_CARE_PROVIDER_SITE_OTHER): Payer: Self-pay | Admitting: Surgery

## 2018-02-22 ENCOUNTER — Other Ambulatory Visit: Payer: Self-pay

## 2018-02-22 ENCOUNTER — Other Ambulatory Visit (INDEPENDENT_AMBULATORY_CARE_PROVIDER_SITE_OTHER): Payer: Self-pay | Admitting: Specialist

## 2018-02-22 ENCOUNTER — Encounter (HOSPITAL_COMMUNITY): Payer: Self-pay

## 2018-02-22 ENCOUNTER — Ambulatory Visit (INDEPENDENT_AMBULATORY_CARE_PROVIDER_SITE_OTHER): Payer: BLUE CROSS/BLUE SHIELD | Admitting: Surgery

## 2018-02-22 ENCOUNTER — Telehealth (INDEPENDENT_AMBULATORY_CARE_PROVIDER_SITE_OTHER): Payer: Self-pay

## 2018-02-22 VITALS — BP 122/77 | HR 88 | Temp 97.3°F | Ht 64.0 in | Wt 163.0 lb

## 2018-02-22 DIAGNOSIS — M4316 Spondylolisthesis, lumbar region: Secondary | ICD-10-CM

## 2018-02-22 DIAGNOSIS — G8929 Other chronic pain: Secondary | ICD-10-CM | POA: Diagnosis not present

## 2018-02-22 DIAGNOSIS — M5441 Lumbago with sciatica, right side: Secondary | ICD-10-CM | POA: Diagnosis not present

## 2018-02-22 DIAGNOSIS — M48061 Spinal stenosis, lumbar region without neurogenic claudication: Secondary | ICD-10-CM | POA: Insufficient documentation

## 2018-02-22 DIAGNOSIS — Z01812 Encounter for preprocedural laboratory examination: Secondary | ICD-10-CM | POA: Insufficient documentation

## 2018-02-22 LAB — RAPID URINE DRUG SCREEN, HOSP PERFORMED
AMPHETAMINES: NOT DETECTED
BENZODIAZEPINES: NOT DETECTED
Barbiturates: NOT DETECTED
Cocaine: POSITIVE — AB
OPIATES: NOT DETECTED
TETRAHYDROCANNABINOL: NOT DETECTED

## 2018-02-22 LAB — BASIC METABOLIC PANEL
Anion gap: 8 (ref 5–15)
BUN: 13 mg/dL (ref 6–20)
CALCIUM: 9.3 mg/dL (ref 8.9–10.3)
CHLORIDE: 104 mmol/L (ref 101–111)
CO2: 29 mmol/L (ref 22–32)
CREATININE: 0.67 mg/dL (ref 0.44–1.00)
GFR calc Af Amer: >60 mL/min (ref >60)
GFR calc non Af Amer: >60 mL/min (ref >60)
GLUCOSE: 105 mg/dL — AB (ref 65–99)
Potassium: 3.4 mmol/L — ABNORMAL LOW (ref 3.5–5.1)
Sodium: 141 mmol/L (ref 135–145)

## 2018-02-22 LAB — SURGICAL PCR SCREEN
MRSA, PCR: NEGATIVE
STAPHYLOCOCCUS AUREUS: NEGATIVE

## 2018-02-22 LAB — CBC
HCT: 40.1 % (ref 36.0–46.0)
Hemoglobin: 12.6 g/dL (ref 12.0–15.0)
MCH: 28 pg (ref 26.0–34.0)
MCHC: 31.4 g/dL (ref 30.0–36.0)
MCV: 89.1 fL (ref 78.0–100.0)
PLATELETS: 257 10*3/uL (ref 150–400)
RBC: 4.5 MIL/uL (ref 3.87–5.11)
RDW: 12.6 % (ref 11.5–15.5)
WBC: 8.5 10*3/uL (ref 4.0–10.5)

## 2018-02-22 LAB — TYPE AND SCREEN
ABO/RH(D): B POS
Antibody Screen: NEGATIVE

## 2018-02-22 LAB — ABO/RH: ABO/RH(D): B POS

## 2018-02-22 NOTE — Telephone Encounter (Signed)
Patient had PAT appt today at hospital.  Rapid urine drug screen was positive for Cocaine.  Patient is scheduled for TLIF on 06/10.

## 2018-02-22 NOTE — Telephone Encounter (Signed)
From Dr. Otelia SergeantNitka:  Please cancel Amy [name]'s surgery. Thanks for the heads up. We will not see her back until her primary care can ensure that she is not anesthesia risk due to cocaine use.  Surgery canceled and patient advised.

## 2018-02-22 NOTE — Progress Notes (Addendum)
PCP: Crosby Oysteravid Tysinger, MD  Cardiologist: Diego CoryMowan Harwani, MD-sees for blood pressure  EKG: 01/23/18 in EPIC  Stress test: pt denies  ECHO: pt denies  Cardiac Cath: pt denies  Chest x-ray: 02/06/18 in Epic  Patient's drug screen came back positive for cocaine.  Called and left a Engineer, waterconfiential voicemail for Kelly ServicesSherrie Billings @ Dr. Barbaraann FasterNitka's office advising her of lab results.  It is notated in [facility] H&P from 02/22/18 that they are aware of recent cocaine and marijuana use.  Will send to anesthesia to determine if further follow up is needed

## 2018-02-22 NOTE — Progress Notes (Signed)
52 year old this, low back pain and right lower extremity radiculopathy presents for preop evaluation.  Continues to have ongoing symptoms.  Failed conservative treatment.  We received preop medical and cardiac clearances.  Full H&P performed and placed in patient's chart.

## 2018-02-22 NOTE — H&P (Signed)
Amy [name] is an 52 y.o. female.   Chief Complaint: Low back pain and right lower extremity radiculopathy HPI: Patient with history of L4-5 spondylolisthesis and the above complaint presents for preop evaluation.  Past Medical History:  Diagnosis Date  . Abnormal PFT 01/2018  . Anxiety   . Arthritis   . Brain aneurysm   . Chronic back pain   . Depression    history of  . Grave's disease 2007   TSH (08/31/2010) = 0.186 (low), free T4 = 0.91 (WNL)  . History of substance abuse    remote past  . Hypertension    cardiology consult 01/2018, Dr. Terrence Dupont  . Postsurgical hypothyroidism   . Raynaud disease 2007  . Smoker     Past Surgical History:  Procedure Laterality Date  . ABDOMINAL HYSTERECTOMY     due to fibroids, still has ovaries  . COLONOSCOPY  02/14/2008   done by Dr. Ardis Hughs - small colonic polyp identified, 6 mm in size and per biopsy --> tubular adenoma with no malignancy or high grade dysplasia noted  . THYROIDECTOMY  03/08/2012   [facility]; history of Graves' disease with multinodular goiter  . TONSILLECTOMY    . WISDOM TOOTH EXTRACTION      Family History  Problem Relation Age of Onset  . Colon cancer Maternal Grandfather   . Cancer Maternal Grandfather        prostate  . Hypertension Mother   . Thyroid disease Mother   . Lupus Mother   . Hypertension Father    Social History:  reports that she has been smoking cigarettes.  She has a 7.00 pack-year smoking history. She has never used smokeless tobacco. She reports that she has current or past drug history. Drugs: Cocaine and Marijuana. She reports that she does not drink alcohol.  Allergies:  Allergies  Allergen Reactions  . Adhesive [Tape] Other (See Comments)    Pulls skin off.  . Morphine And Related Other (See Comments)    Does not like feeling    No medications prior to admission.    Results for orders placed or performed during the hospital encounter of 02/22/18 (from the past 48  hour(s))  Surgical pcr screen     Status: None   Collection Time: 02/22/18  8:32 AM  Result Value Ref Range   MRSA, PCR NEGATIVE NEGATIVE   Staphylococcus aureus NEGATIVE NEGATIVE    Comment: (NOTE) The Xpert SA Assay (FDA approved for NASAL specimens in patients 13 years of age and older), is one component of a comprehensive surveillance program. It is not intended to diagnose infection nor to guide or monitor treatment. Performed at [facility], [place] [address] [place], [place] [address]   Urine rapid drug screen (hosp performed)     Status: Abnormal   Collection Time: 02/22/18  8:32 AM  Result Value Ref Range   Opiates NONE DETECTED NONE DETECTED   Cocaine POSITIVE (A) NONE DETECTED   Benzodiazepines NONE DETECTED NONE DETECTED   Amphetamines NONE DETECTED NONE DETECTED   Tetrahydrocannabinol NONE DETECTED NONE DETECTED   Barbiturates NONE DETECTED NONE DETECTED    Comment: (NOTE) DRUG SCREEN FOR MEDICAL PURPOSES ONLY.  IF CONFIRMATION IS NEEDED FOR ANY PURPOSE, NOTIFY LAB WITHIN 5 DAYS. LOWEST DETECTABLE LIMITS FOR URINE DRUG SCREEN Drug Class                     Cutoff (ng/mL) Amphetamine and metabolites    1000 Barbiturate  and metabolites    200 Benzodiazepine                 038 Tricyclics and metabolites     300 Opiates and metabolites        300 Cocaine and metabolites        300 THC                            50 Performed at [facility], [place] [address] [place], [place] [address]   Basic metabolic panel     Status: Abnormal   Collection Time: 02/22/18  8:33 AM  Result Value Ref Range   Sodium 141 135 - 145 mmol/L   Potassium 3.4 (L) 3.5 - 5.1 mmol/L   Chloride 104 101 - 111 mmol/L   CO2 29 22 - 32 mmol/L   Glucose, Bld 105 (H) 65 - 99 mg/dL   BUN 13 6 - 20 mg/dL   Creatinine, Ser 0.67 0.44 - 1.00 mg/dL   Calcium 9.3 8.9 - 10.3 mg/dL   GFR calc non Af Amer >60 >60 mL/min   GFR calc Af Amer >60 >60 mL/min    Comment: (NOTE) The eGFR has  been calculated using the CKD EPI equation. This calculation has not been validated in all clinical situations. eGFR's persistently <60 mL/min signify possible Chronic Kidney Disease.    Anion gap 8 5 - 15    Comment: Performed at [facility] [address] [place], [place] [address]  CBC     Status: None   Collection Time: 02/22/18  8:33 AM  Result Value Ref Range   WBC 8.5 4.0 - 10.5 K/uL   RBC 4.50 3.87 - 5.11 MIL/uL   Hemoglobin 12.6 12.0 - 15.0 g/dL   HCT 40.1 36.0 - 46.0 %   MCV 89.1 78.0 - 100.0 fL   MCH 28.0 26.0 - 34.0 pg   MCHC 31.4 30.0 - 36.0 g/dL   RDW 12.6 11.5 - 15.5 %   Platelets 257 150 - 400 K/uL    Comment: Performed at [facility], [place] [address] [place], [place] [address]   No results found.  Review of Systems  Constitutional: Negative.   HENT: Negative.   Respiratory: Negative.   Cardiovascular: Negative.   Gastrointestinal: Negative.   Genitourinary: Negative.   Musculoskeletal: Positive for back pain.  Skin: Negative.   Neurological: Positive for tingling and weakness.  Psychiatric/Behavioral: Negative.     Last menstrual period 01/26/2000. Physical Exam  Constitutional: She is oriented to person, place, and time. She appears well-developed.  HENT:  Head: Normocephalic.  Respiratory: No respiratory distress. She has no wheezes.  GI: She exhibits no distension. There is no tenderness.  Musculoskeletal: She exhibits tenderness.  Positive straight leg raise on the right.  no focal motor deficits  Neurological: She is alert and oriented to person, place, and time.  Skin: Skin is warm and dry.  Psychiatric: She has a normal mood and affect.     Assessment/Plan L4-5 spondylolisthesis, low back pain and right lower extremity radiculopathy.  We will proceed with Right L4-5 Transforaminal lumbar interbody fusion with Depuy mPACT screws and rods, Concorde cage, local bone graft, Cancellous Chips Allograft and Vivogen as scheduled.   Surgical procedure along with potential risk and complications and rehab/recovery time discussed.  All questions answered and wishes to proceed.  We received preop medical and cardiac clearances.  Benjiman Core, PA-C 02/22/2018, 10:12 AM

## 2018-02-26 NOTE — Telephone Encounter (Signed)
Recv'd response back no P.A. Required Called pharmacy went thru $20, pt informed

## 2018-02-27 ENCOUNTER — Inpatient Hospital Stay (HOSPITAL_COMMUNITY): Admission: RE | Admit: 2018-02-27 | Payer: BLUE CROSS/BLUE SHIELD | Source: Ambulatory Visit | Admitting: Specialist

## 2018-02-27 ENCOUNTER — Encounter (HOSPITAL_COMMUNITY): Admission: RE | Payer: Self-pay | Source: Ambulatory Visit

## 2018-02-27 SURGERY — POSTERIOR LUMBAR FUSION 1 LEVEL
Anesthesia: General

## 2018-03-03 ENCOUNTER — Ambulatory Visit: Payer: BLUE CROSS/BLUE SHIELD | Admitting: Medical

## 2018-03-03 ENCOUNTER — Telehealth: Payer: Self-pay

## 2018-03-03 ENCOUNTER — Encounter: Payer: Self-pay | Admitting: Medical

## 2018-03-03 ENCOUNTER — Other Ambulatory Visit: Payer: Self-pay | Admitting: Medical

## 2018-03-03 VITALS — BP 142/90 | HR 81 | Temp 97.8°F | Ht 64.0 in | Wt 167.4 lb

## 2018-03-03 DIAGNOSIS — J449 Chronic obstructive pulmonary disease, unspecified: Secondary | ICD-10-CM | POA: Insufficient documentation

## 2018-03-03 DIAGNOSIS — G8929 Other chronic pain: Secondary | ICD-10-CM | POA: Diagnosis not present

## 2018-03-03 DIAGNOSIS — I10 Essential (primary) hypertension: Secondary | ICD-10-CM | POA: Diagnosis not present

## 2018-03-03 DIAGNOSIS — F1910 Other psychoactive substance abuse, uncomplicated: Secondary | ICD-10-CM

## 2018-03-03 DIAGNOSIS — R921 Mammographic calcification found on diagnostic imaging of breast: Secondary | ICD-10-CM

## 2018-03-03 DIAGNOSIS — M5440 Lumbago with sciatica, unspecified side: Secondary | ICD-10-CM

## 2018-03-03 DIAGNOSIS — M25551 Pain in right hip: Secondary | ICD-10-CM | POA: Diagnosis not present

## 2018-03-03 DIAGNOSIS — Z1231 Encounter for screening mammogram for malignant neoplasm of breast: Secondary | ICD-10-CM | POA: Diagnosis not present

## 2018-03-03 DIAGNOSIS — H6503 Acute serous otitis media, bilateral: Secondary | ICD-10-CM | POA: Diagnosis not present

## 2018-03-03 DIAGNOSIS — R946 Abnormal results of thyroid function studies: Secondary | ICD-10-CM | POA: Diagnosis not present

## 2018-03-03 DIAGNOSIS — Z1239 Encounter for other screening for malignant neoplasm of breast: Secondary | ICD-10-CM

## 2018-03-03 MED ORDER — METOPROLOL SUCCINATE ER 100 MG PO TB24: 100.0000 mg | ORAL_TABLET | Freq: Every day | ORAL | 0 refills | Status: DC

## 2018-03-03 MED ORDER — AMOXICILLIN 875 MG PO TABS: 875.0000 mg | ORAL_TABLET | Freq: Two times a day (BID) | ORAL | 0 refills | Status: DC

## 2018-03-03 NOTE — Patient Instructions (Signed)
RESOURCES in [place], [place]  If you are experiencing a mental health crisis or an emergency, please call 911 or go to the nearest emergency department.  [facility]   [phone] [facility]  [phone] [facility]   [phone]  Suicide Hotline 1-800-Suicide (1-800-784-2433)  National Suicide Prevention Lifeline 1-800-273-TALK  (1-800-273-8255)  Domestic Violence, Rape/Crisis - [facility] 336-273-7273  The National Domestic Violence Hotline 1-800-799-SAFE (1-800-799-7233)  To report Child or Elder Abuse, please call: [place] Police Department  [phone] [place] Sher[employer] Department  [phone]  LGBT Youth Crisis Line 1-866-488-7386  Teen Crisis line 336-387-6161 or 1-877-332-7333     Psychiatry and Counseling services  [facility] [address], [place], [place] [address] [phone]  Holly Ingram, therapist Dr. Carey Cottle, psychiatrist Dr. Glenn Jennings, child psychiatrist   Dr. Shonnie Poudrier Fuller [address], [place], [place] [address] [phone]   Dr. Rupinder Kaur, psychiatry [address], [place], [place] [address] [phone]   [facility] [address], [place], [place] [address] [phone]   [facility] [address], [place], [place] [address] [phone]    Counseling Services (NON- psychiatrist offices)  [facility] [address], [place], [place] [address] [phone]   [facility] [phone] [address], [place], [place] [address]   [facility] [phone]  [email or web address] [address], [place], [place] [address]   Merrianne M. Leff, therapist [phone] [address] [place], [place] [address]   [facility] [phone] [address], [place], [place]  [address]   Jill White-Huffman, therapist [phone] [address], [place], [place] [address]   [facility] [phone] [address], , [place] [address]   

## 2018-03-03 NOTE — Telephone Encounter (Signed)
Called pt to Advise of breast exam at the [facility] 03-13-18-@ 1:10pm . Thanks [facility]

## 2018-03-03 NOTE — Progress Notes (Addendum)
Subjective: Chief Complaint  Patient presents with  . other    ear issues on left side started a week ago, pt needs mammo, hip pain on right side started years ago    Here for discomfort left ear x 1 week.  No sore throat, no runny nose, no sneezing.   Feels a sore in her nose x 2 days, left sided.  Needs mammogram.  hasn't had mammogram in a while and she was lost to f/u due to insurance.    Last mmammogramhad calcifications  Having lumbar surgery soon.  She is scared to have surgery.   Seeing Dr. Otelia Sergeant.  Having right hip pain for years.   Wants refill on oxycodone  Last visit we started Breo.  She can tell a big difference in breathing.     Past Medical History:  Diagnosis Date  . Abnormal PFT 01/2018  . Anxiety   . Arthritis   . Brain aneurysm   . Chronic back pain   . Depression    history of  . Grave's disease 2007   TSH (08/31/2010) = 0.186 (low), free T4 = 0.91 (WNL)  . History of substance abuse    remote past  . Hypertension    cardiology consult 01/2018, Dr. Sharyn Lull  . Postsurgical hypothyroidism   . Raynaud disease 2007  . Smoker    Current Outpatient Medications on File Prior to Visit  Medication Sig Dispense Refill  . amitriptyline (ELAVIL) 100 MG tablet Take 100 mg by mouth at bedtime.    . fluticasone furoate-vilanterol (BREO ELLIPTA) 100-25 MCG/INH AEPB Inhale 1 puff into the lungs daily. 28 each 5  . levothyroxine (SYNTHROID, LEVOTHROID) 150 MCG tablet Take 150 mcg by mouth daily before breakfast.   11  . losartan (COZAAR) 50 MG tablet Take 50 mg by mouth daily.  3  . oxyCODONE-acetaminophen (PERCOCET/ROXICET) 5-325 MG tablet Take 1 tablet by mouth every 4 (four) hours as needed for severe pain. 40 tablet 0   No current facility-administered medications on file prior to visit.    ROS as in subjective   Objective: BP (!) 142/90 (BP Location: Left Arm, Patient Position: Sitting)   Pulse 81   Temp 97.8 F (36.6 C)   Ht 5\' 4"  (1.626 m)   Wt 167 lb  6.4 oz (75.9 kg)   LMP 01/26/2000   SpO2 99%   BMI 28.73 kg/m   BP Readings from Last 3 Encounters:  03/03/18 (!) 142/90  02/22/18 (!) 141/91  02/22/18 122/77   Wt Readings from Last 3 Encounters:  03/03/18 167 lb 6.4 oz (75.9 kg)  02/22/18 163 lb 4.8 oz (74.1 kg)  02/22/18 163 lb (73.9 kg)   General appearance: alert, no distress, WD/WN HEENT: normocephalic, sclerae anicteric,TMs with serous fluid behind TMs,  Yellowish, nares patent, no discharge or erythema, pharynx normal Oral cavity: MMM, no lesions Neck: supple, no lymphadenopathy, no thyromegaly, no masses Heart: RRR, normal S1, S2, no murmurs Lungs: CTA bilaterally, no wheezes, rhonchi, or rales Pulses: 2+ symmetric, upper and lower extremities, normal cap refill Right hip nontender, normal ROM, no deformity Back: tender lumbar region right sided, no deformity Rest of legs nontender, normal ROM Legs neurovascularly intact    Assessment: Encounter Diagnoses  Name Primary?  . Non-recurrent acute serous otitis media of both ears Yes  . Screening for breast cancer   . Breast calcifications   . Chronic obstructive pulmonary disease, unspecified COPD type (HCC)   . Chronic low back pain with sciatica,  sciatica laterality unspecified, unspecified back pain laterality   . Abnormal thyroid function test   . Right hip pain   . Essential hypertension, benign   . Substance abuse (HCC)      Plan: OM - begin round of amoxicillin  We will set up for diagnostic mammogram  COPD - c/t breo  chronic low back pain - f/u with Dr. Otelia SergeantNitka.  She requested a refill on oxycodone.  I discussed with her her recent finding of cocaine on her drug screen.  Advised I would not be refilling any controlled substances for her.  She states she understood.  Recheck thyroid labs  Right hip pain - good ROM today, no obvious deformity, reviewed 2018 xray.  I suspect her pain is really related to her back pain issue, not the hip  HTN - c/t  losartan 50mg , but increase Toprol XL to 100mg  daily  substance abuse - counseled on abstinence and getting help.  She admits use and says she will start back with [facility] for counseling.      Kallee was seen today for other.  Diagnoses and all orders for this visit:  Non-recurrent acute serous otitis media of both ears  Screening for breast cancer -     MM Digital Diagnostic Bilat; Future  Breast calcifications -     MM Digital Diagnostic Bilat; Future  Chronic obstructive pulmonary disease, unspecified COPD type (HCC)  Chronic low back pain with sciatica, sciatica laterality unspecified, unspecified back pain laterality  Abnormal thyroid function test -     TSH -     T4, free -     T3  Right hip pain  Essential hypertension, benign  Substance abuse (HCC)  Other orders -     amoxicillin (AMOXIL) 875 MG tablet; Take 1 tablet (875 mg total) by mouth 2 (two) times daily. -     metoprolol succinate (TOPROL XL) 100 MG 24 hr tablet; Take 1 tablet (100 mg total) by mouth daily. Take with or immediately following a meal.

## 2018-03-04 LAB — T4, FREE: Free T4: 1.71 ng/dL (ref 0.82–1.77)

## 2018-03-04 LAB — T3: T3, Total: 99 ng/dL (ref 71–180)

## 2018-03-04 LAB — TSH: TSH: <0.006 u[IU]/mL — ABNORMAL LOW (ref 0.450–4.500)

## 2018-03-15 ENCOUNTER — Inpatient Hospital Stay (INDEPENDENT_AMBULATORY_CARE_PROVIDER_SITE_OTHER): Payer: BLUE CROSS/BLUE SHIELD | Admitting: Surgery

## 2018-03-16 ENCOUNTER — Inpatient Hospital Stay: Admission: RE | Admit: 2018-03-16 | Payer: Self-pay | Source: Ambulatory Visit

## 2018-07-05 ENCOUNTER — Ambulatory Visit: Payer: Self-pay | Admitting: Family Medicine

## 2018-08-01 ENCOUNTER — Telehealth (INDEPENDENT_AMBULATORY_CARE_PROVIDER_SITE_OTHER): Payer: Self-pay | Admitting: Specialist

## 2018-08-01 NOTE — Telephone Encounter (Signed)
Patient called asked if Dr Amy [name] will write her a letter stating she was going to have surgery back in June. 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 Patient said she was out of work for a while. The number to contact patient is [phone]

## 2018-08-02 NOTE — Telephone Encounter (Signed)
Patient called asked if Dr Otelia SergeantNitka will write her a letter stating she was going to have surgery back in June. 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 Patient said she was out of work for a while.-----Please advise

## 2018-10-25 ENCOUNTER — Encounter (HOSPITAL_COMMUNITY): Payer: Self-pay | Admitting: Emergency Medicine

## 2018-10-25 ENCOUNTER — Ambulatory Visit (HOSPITAL_COMMUNITY)
Admission: EM | Admit: 2018-10-25 | Discharge: 2018-10-25 | Disposition: A | Discharge status: Home / Self Care | Payer: BLUE CROSS/BLUE SHIELD | Attending: Emergency Medicine | Admitting: Emergency Medicine

## 2018-10-25 DIAGNOSIS — T162XXA Foreign body in left ear, initial encounter: Secondary | ICD-10-CM

## 2018-10-25 MED ORDER — FLUTICASONE PROPIONATE 50 MCG/ACT NA SUSP: 2.0000 | Freq: Every day | NASAL | 0 refills | Status: DC

## 2018-10-25 NOTE — ED Provider Notes (Addendum)
[facility]    CSN: [id number] Arrival date & time: 10/25/18  1129     History   Chief Complaint Chief Complaint  Patient presents with  . Foreign Body in Ear    [name] is a 53 y.o. female.   53 year old female comes in few hour history of foreign body in left ear. States she was using a cotton swab and the cotton swab is stuck in her ear. States she feels popping sensation in the ears, which prompted her to use the cotton swab. She denies URI symptoms such as cough congestion, sore throat. Denies fever, chills, night sweats.      Past Medical History:  Diagnosis Date  . Abnormal PFT 01/2018  . Anxiety   . Arthritis   . Brain aneurysm   . Chronic back pain   . Depression    history of  . Grave's disease 2007   TSH (08/31/2010) = 0.186 (low), free T4 = 0.91 (WNL)  . History of substance abuse (HCC)    remote past  . Hypertension    cardiology consult 01/2018, Dr. Sharyn Lull  . Postsurgical hypothyroidism   . Raynaud disease 2007  . Smoker     Patient Active Problem List   Diagnosis Date Noted  . Abnormal thyroid function test 03/03/2018  . Chronic obstructive pulmonary disease (HCC) 03/03/2018  . Breast calcifications 03/03/2018  . Right hip pain 03/03/2018  . Substance abuse (HCC) 03/03/2018  . Need for Tdap vaccination 02/03/2018  . Preop examination 02/03/2018  . Abnormal PFT 02/03/2018  . Hypothyroidism 02/03/2018  . Spondylolisthesis, lumbar region 01/18/2017  . Cocaine dependence (HCC) 07/25/2015  . 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/25/2015  . Hx of non anemic vitamin B12 deficiency 11/08/2014  . Brain aneurysm 09/09/2014  . Smoker 07/22/2014  . Rhinitis, allergic 07/22/2014  . Breast pain, right 07/09/2014  . Chest pain, unspecified 07/02/2013  . Chronic low back pain with sciatica 01/24/2013  . Health care maintenance 10/16/2012  . Hot flashes 02/08/2012  . Post-surgical hypothyroidism 12/07/2011  . Fibromyalgia muscle  pain 03/31/2011  . Migraine 03/31/2011  . Essential hypertension, benign 11/14/2008  . DEPRESSION 08/10/2006    Past Surgical History:  Procedure Laterality Date  . ABDOMINAL HYSTERECTOMY     due to fibroids, still has ovaries  . COLONOSCOPY  02/14/2008   done by Dr. Christella Hartigan - small colonic polyp identified, 6 mm in size and per biopsy --> tubular adenoma with no malignancy or high grade dysplasia noted  . THYROIDECTOMY  03/08/2012   [facility]; history of Graves' disease with multinodular goiter  . TONSILLECTOMY    . WISDOM TOOTH EXTRACTION      OB History    Gravida  3   Para  3   Term  2   Preterm  1   AB  0   Living  2     SAB  0   TAB      Ectopic      Multiple      Live Births               Home Medications    Prior to Admission medications   Medication Sig Start Date End Date Taking? Authorizing Provider  amitriptyline (ELAVIL) 100 MG tablet Take 100 mg by mouth at bedtime.    [provider]  amoxicillin (AMOXIL) 875 MG tablet Take 1 tablet (875 mg total) by mouth 2 (two) times daily. Patient not taking:  Reported on 10/25/2018 03/03/18   Tysinger, Kermit Balo, PA-C  fluticasone [facility]) 50 MCG/ACT nasal spray Place 2 sprays into both nostrils daily. 10/25/18   Cathie Hoops, Amy V, PA-C  fluticasone furoate-vilanterol (BREO ELLIPTA) 100-25 MCG/INH AEPB Inhale 1 puff into the lungs daily. 02/08/18   Tysinger, Kermit Balo, PA-C  levothyroxine (SYNTHROID, LEVOTHROID) 150 MCG tablet Take 150 mcg by mouth daily before breakfast.  01/23/18   [provider]  losartan (COZAAR) 50 MG tablet Take 50 mg by mouth daily. 01/23/18   [provider]  metoprolol succinate (TOPROL XL) 100 MG 24 hr tablet Take 1 tablet (100 mg total) by mouth daily. Take with or immediately following a meal. 03/03/18   Tysinger, Kermit Balo, PA-C  oxyCODONE-acetaminophen (PERCOCET/ROXICET) 5-325 MG tablet Take 1 tablet by mouth every 4 (four) hours as needed for severe pain. 02/16/18    Kerrin Champagne, MD    Family History Family History  Problem Relation Age of Onset  . Colon cancer Maternal Grandfather   . Cancer Maternal Grandfather        prostate  . Hypertension Mother   . Thyroid disease Mother   . Lupus Mother   . Hypertension Father     Social History Social History   Tobacco Use  . Smoking status: Current Every Day Smoker    Packs/day: 0.20    Years: 35.00    Pack years: 7.00    Types: Cigarettes  . Smokeless tobacco: Never Used  Substance Use Topics  . Alcohol use: No    Alcohol/week: 0.0 standard drinks    Comment: sober for several years  . Drug use: Not Currently    Types: Cocaine, Marijuana    Comment: pt reports she has smoked marijuana recently, advised not to smoke again prior to surgery (02/22/18), went to treatment 07-2016 for crack, relapsed in 08-2017, none since.       Allergies   Adhesive [tape] and Morphine and related   Review of Systems Review of Systems  Reason unable to perform ROS: See HPI as above.     Physical Exam Triage Vital Signs ED Triage Vitals [10/25/18 1250]  Enc Vitals Group     BP (!) 150/92     Pulse Rate 74     Resp 18     Temp 97.8 F (36.6 C)     Temp Source Temporal     SpO2 100 %     Weight      Height      Head Circumference      Peak Flow      Pain Score 4     Pain Loc      Pain Edu?      Excl. in GC?    No data found.  Updated Vital Signs BP (!) 150/92 (BP Location: Right Arm)   Pulse 74   Temp 97.8 F (36.6 C) (Temporal)   Resp 18   LMP 01/26/2000   SpO2 100%   Visual Acuity Right Eye Distance:   Left Eye Distance:   Bilateral Distance:    Right Eye Near:   Left Eye Near:    Bilateral Near:     Physical Exam Constitutional:      General: She is not in acute distress.    Appearance: She is well-developed. She is not diaphoretic.  HENT:     Head: Normocephalic and atraumatic.     Right Ear: Tympanic membrane, ear canal and external ear normal. Tympanic membrane  is  not erythematous or bulging.     Left Ear: Tympanic membrane and external ear normal. A foreign body (cotton tip) is present. Tympanic membrane is not erythematous or bulging.     Nose: Nose normal. No congestion or rhinorrhea.     Mouth/Throat:     Mouth: Mucous membranes are moist.     Pharynx: Oropharynx is clear. Uvula midline.  Eyes:     Conjunctiva/sclera: Conjunctivae normal.     Pupils: Pupils are equal, round, and reactive to light.  Neurological:     Mental Status: She is alert and oriented to person, place, and time.      UC Treatments / Results  Labs (all labs ordered are listed, but only abnormal results are displayed) Labs Reviewed - No data to display  EKG None  Radiology No results found.  Procedures Foreign Body Removal Date/Time: 10/25/2018 2:45 PM Performed by: Belinda FisherYu, Amy V, PA-C Authorized by: Domenick GongMortenson, Ashley, MD   Consent:    Consent obtained:  Verbal   Consent given by:  Patient   Risks discussed:  Pain and incomplete removal   Alternatives discussed:  Referral Location:    Location:  Ear   Ear location:  L ear   Depth: ear canal.   Tendon involvement:  None Anesthesia (see MAR for exact dosages):    Anesthesia method:  None Procedure type:    Procedure complexity:  Simple Post-procedure details:    Patient tolerance of procedure:  Tolerated well, no immediate complications Comments:     Cotton swab visualized in ear canal with light. Hemostat was used to grab cotton swab. 1 cotton swab removed from left ear canal. No other foreign body seen. TM remains pearly grey without perforation, mid ear effusion, erythema, bulging.    (including critical care time)  Medications Ordered in UC Medications - No data to display  Initial Impression / Assessment and Plan / UC Course  I have reviewed the triage vital signs and the nursing notes.  Pertinent labs & imaging results that were available during my care of the patient were reviewed by me and  considered in my medical decision making (see chart for details).    Foreign body removed from left ear canal. Will provide flonase for possible eustachian tube dysfunction causing popping sensation. Recheck as needed.  Final Clinical Impressions(s) / UC Diagnoses   Final diagnoses:  Foreign body of left ear, initial encounter    ED Prescriptions    Medication Sig Dispense Auth. Provider   fluticasone (FLONASE) 50 MCG/ACT nasal spray Place 2 sprays into both nostrils daily. 1 g Threasa AlphaYu, Amy V, PA-C        Yu, Amy V, PA-C 10/25/18 1448    Belinda FisherYu, Amy V, PA-C 10/25/18 [phone]

## 2018-10-25 NOTE — Discharge Instructions (Signed)
Cotton swab removed. No other foreign bodies seen. Flonase for possible eustachian tube dysfunction causing popping sensation. Use mineral oil in ear to prevent dry/itching skin. Recheck as needed.

## 2018-10-25 NOTE — ED Triage Notes (Signed)
Pt sts part of qtip in left ear

## 2018-10-28 ENCOUNTER — Emergency Department (HOSPITAL_COMMUNITY)
Admission: EM | Admit: 2018-10-28 | Discharge: 2018-10-28 | Disposition: A | Discharge status: Home / Self Care | Payer: Self-pay | Attending: Emergency Medicine | Admitting: Emergency Medicine

## 2018-10-28 ENCOUNTER — Encounter (HOSPITAL_COMMUNITY): Payer: Self-pay

## 2018-10-28 ENCOUNTER — Other Ambulatory Visit: Payer: Self-pay

## 2018-10-28 ENCOUNTER — Emergency Department (HOSPITAL_COMMUNITY): Payer: Self-pay

## 2018-10-28 DIAGNOSIS — R0789 Other chest pain: Secondary | ICD-10-CM | POA: Insufficient documentation

## 2018-10-28 DIAGNOSIS — I10 Essential (primary) hypertension: Secondary | ICD-10-CM | POA: Insufficient documentation

## 2018-10-28 DIAGNOSIS — E039 Hypothyroidism, unspecified: Secondary | ICD-10-CM | POA: Insufficient documentation

## 2018-10-28 DIAGNOSIS — F17210 Nicotine dependence, cigarettes, uncomplicated: Secondary | ICD-10-CM | POA: Insufficient documentation

## 2018-10-28 DIAGNOSIS — Z79899 Other long term (current) drug therapy: Secondary | ICD-10-CM | POA: Insufficient documentation

## 2018-10-28 LAB — CBC WITH DIFFERENTIAL/PLATELET
ABS IMMATURE GRANULOCYTES: 0.01 10*3/uL (ref 0.00–0.07)
Basophils Absolute: 0 10*3/uL (ref 0.0–0.1)
Basophils Relative: 0 %
Eosinophils Absolute: 0.2 10*3/uL (ref 0.0–0.5)
Eosinophils Relative: 3 %
HCT: 40.5 % (ref 36.0–46.0)
HEMOGLOBIN: 12.7 g/dL (ref 12.0–15.0)
Immature Granulocytes: 0 %
LYMPHS PCT: 42 %
Lymphs Abs: 2.4 10*3/uL (ref 0.7–4.0)
MCH: 28.7 pg (ref 26.0–34.0)
MCHC: 31.4 g/dL (ref 30.0–36.0)
MCV: 91.4 fL (ref 80.0–100.0)
MONOS PCT: 6 %
Monocytes Absolute: 0.3 10*3/uL (ref 0.1–1.0)
NEUTROS ABS: 2.8 10*3/uL (ref 1.7–7.7)
Neutrophils Relative %: 49 %
Platelets: 224 10*3/uL (ref 150–400)
RBC: 4.43 MIL/uL (ref 3.87–5.11)
RDW: 11.9 % (ref 11.5–15.5)
WBC: 5.8 10*3/uL (ref 4.0–10.5)
nRBC: 0 % (ref 0.0–0.2)

## 2018-10-28 LAB — RAPID URINE DRUG SCREEN, HOSP PERFORMED
AMPHETAMINES: NOT DETECTED
BENZODIAZEPINES: NOT DETECTED
Barbiturates: NOT DETECTED
Cocaine: NOT DETECTED
Opiates: NOT DETECTED
Tetrahydrocannabinol: NOT DETECTED

## 2018-10-28 LAB — BASIC METABOLIC PANEL
Anion gap: 12 (ref 5–15)
BUN: 7 mg/dL (ref 6–20)
CHLORIDE: 108 mmol/L (ref 98–111)
CO2: 22 mmol/L (ref 22–32)
Calcium: 9.2 mg/dL (ref 8.9–10.3)
Creatinine, Ser: 0.79 mg/dL (ref 0.44–1.00)
GFR calc Af Amer: >60 mL/min (ref >60)
GFR calc non Af Amer: >60 mL/min (ref >60)
GLUCOSE: 94 mg/dL (ref 70–99)
POTASSIUM: 3.8 mmol/L (ref 3.5–5.1)
Sodium: 142 mmol/L (ref 135–145)

## 2018-10-28 LAB — I-STAT TROPONIN, ED: Troponin i, poc: 0.01 ng/mL (ref 0.00–0.08)

## 2018-10-28 LAB — TROPONIN I

## 2018-10-28 MED ORDER — KETOROLAC TROMETHAMINE 30 MG/ML IJ SOLN
30.0000 mg | Freq: Once | INTRAMUSCULAR | Status: AC
  Administered 2018-10-28: 30 mg via INTRAMUSCULAR
  Filled 2018-10-28: qty 1

## 2018-10-28 MED ORDER — IOPAMIDOL (ISOVUE-300) INJECTION 61%
INTRAVENOUS | Status: AC
  Administered 2018-10-28: 50 mL
  Filled 2018-10-28: qty 50

## 2018-10-28 NOTE — Discharge Instructions (Addendum)
Please follow up at your schedule appoitnment on Tuesday with primary care.You  may alternate ibuprofen or tylenol for your pain.

## 2018-10-28 NOTE — ED Triage Notes (Signed)
Pt reports 9 days clean, going to ADS and 12 step meetings. CP began 2 days ago, got worse last night. Numbness began yesterday.

## 2018-10-28 NOTE — ED Provider Notes (Signed)
[facility] EMERGENCY DEPARTMENT Provider Note   CSN: [id number] Arrival date & time: 10/28/18  1324     History   Chief Complaint Chief Complaint  Patient presents with  . Chest Pain  . Numbness    [name] is a 53 y.o. female.  53 y.o female with a PMH of HTN, Substance abuse, Brain aneurysm presents to the ED with a chief complaint of chest pain x 2 days ago. Patient reports he currently stopped using crack cocaine 9 days ago, she reports she has been clean for this past 9 days and now had some chest pain began 2 days ago, she reports a chest pressure in the middle of her chest radiating to her right arm, reports some right hand tingling.  Dates the pain on her hands is worse at night as she reports they feel swollen, has tried applying heating pad to both hands.  She is currently in outpatient rehab, [facility]  She reports she is unable to complete inpatient rehab due to the fact that her father who is 51 years old currently lives with her.  She has taken some ibuprofen for her pain but reports no improvement in symptoms.  She reports is harder for her to sleep, eat, perform her ADLs since quitting.  She endorses some shortness of breath.  She denies any SI, HI, headache, weakness to her upper or lower extremities.  She has an appointment with a mental health counselor on Thursday, she also has an appointment to establish care with a PCP on Tuesday.     Past Medical History:  Diagnosis Date  . Abnormal PFT 01/2018  . Anxiety   . Arthritis   . Brain aneurysm   . Chronic back pain   . Depression    history of  . Grave's disease 2007   TSH (08/31/2010) = 0.186 (low), free T4 = 0.91 (WNL)  . History of substance abuse (HCC)    remote past  . Hypertension    cardiology consult 01/2018, Dr. Sharyn Lull  . Postsurgical hypothyroidism   . Raynaud disease 2007  . Smoker     Patient Active Problem List   Diagnosis Date Noted  . Abnormal thyroid  function test 03/03/2018  . Chronic obstructive pulmonary disease (HCC) 03/03/2018  . Breast calcifications 03/03/2018  . Right hip pain 03/03/2018  . Substance abuse (HCC) 03/03/2018  . Need for Tdap vaccination 02/03/2018  . Preop examination 02/03/2018  . Abnormal PFT 02/03/2018  . Hypothyroidism 02/03/2018  . Spondylolisthesis, lumbar region 01/18/2017  . Cocaine dependence (HCC) 07/25/2015  . 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/25/2015  . Hx of non anemic vitamin B12 deficiency 11/08/2014  . Brain aneurysm 09/09/2014  . Smoker 07/22/2014  . Rhinitis, allergic 07/22/2014  . Breast pain, right 07/09/2014  . Chest pain, unspecified 07/02/2013  . Chronic low back pain with sciatica 01/24/2013  . Health care maintenance 10/16/2012  . Hot flashes 02/08/2012  . Post-surgical hypothyroidism 12/07/2011  . Fibromyalgia muscle pain 03/31/2011  . Migraine 03/31/2011  . Essential hypertension, benign 11/14/2008  . DEPRESSION 08/10/2006    Past Surgical History:  Procedure Laterality Date  . ABDOMINAL HYSTERECTOMY     due to fibroids, still has ovaries  . COLONOSCOPY  02/14/2008   done by Dr. Christella Hartigan - small colonic polyp identified, 6 mm in size and per biopsy --> tubular adenoma with no malignancy or high grade dysplasia noted  . THYROIDECTOMY  03/08/2012   [facility]; history  of Graves' disease with multinodular goiter  . TONSILLECTOMY    . WISDOM TOOTH EXTRACTION       OB History    Gravida  3   Para  3   Term  2   Preterm  1   AB  0   Living  2     SAB  0   TAB      Ectopic      Multiple      Live Births               Home Medications    Prior to Admission medications   Medication Sig Start Date End Date Taking? Authorizing Provider  amitriptyline (ELAVIL) 100 MG tablet Take 100 mg by mouth at bedtime.    [provider]  amoxicillin (AMOXIL) 875 MG tablet Take 1 tablet (875 mg total) by mouth 2 (two) times daily. Patient not  taking: Reported on 10/25/2018 03/03/18   Tysinger, Kermit Balo, PA-C  fluticasone [facility]) 50 MCG/ACT nasal spray Place 2 sprays into both nostrils daily. 10/25/18   Cathie Hoops, Amy V, PA-C  fluticasone furoate-vilanterol (BREO ELLIPTA) 100-25 MCG/INH AEPB Inhale 1 puff into the lungs daily. 02/08/18   Tysinger, Kermit Balo, PA-C  levothyroxine (SYNTHROID, LEVOTHROID) 150 MCG tablet Take 150 mcg by mouth daily before breakfast.  01/23/18   [provider]  losartan (COZAAR) 50 MG tablet Take 50 mg by mouth daily. 01/23/18   [provider]  metoprolol succinate (TOPROL XL) 100 MG 24 hr tablet Take 1 tablet (100 mg total) by mouth daily. Take with or immediately following a meal. 03/03/18   Tysinger, Kermit Balo, PA-C  oxyCODONE-acetaminophen (PERCOCET/ROXICET) 5-325 MG tablet Take 1 tablet by mouth every 4 (four) hours as needed for severe pain. 02/16/18   Kerrin Champagne, MD    Family History Family History  Problem Relation Age of Onset  . Colon cancer Maternal Grandfather   . Cancer Maternal Grandfather        prostate  . Hypertension Mother   . Thyroid disease Mother   . Lupus Mother   . Hypertension Father     Social History Social History   Tobacco Use  . Smoking status: Current Every Day Smoker    Packs/day: 0.20    Years: 35.00    Pack years: 7.00    Types: Cigarettes  . Smokeless tobacco: Never Used  Substance Use Topics  . Alcohol use: No    Alcohol/week: 0.0 standard drinks    Comment: sober for several years  . Drug use: Not Currently    Types: Cocaine, Marijuana    Comment: pt reports she has smoked marijuana recently, advised not to smoke again prior to surgery (02/22/18), went to treatment 07-2016 for crack, relapsed in 08-2017, none since.       Allergies   Adhesive [tape]; Morphine and related; and Vicodin [hydrocodone-acetaminophen]   Review of Systems Review of Systems  Constitutional: Negative for chills and fever.  HENT: Negative for ear pain and sore throat.     Eyes: Negative for pain and visual disturbance.  Respiratory: Positive for shortness of breath. Negative for cough.   Cardiovascular: Positive for chest pain. Negative for palpitations.  Gastrointestinal: Negative for abdominal pain and vomiting.  Genitourinary: Negative for dysuria and hematuria.  Musculoskeletal: Negative for arthralgias and back pain.  Skin: Negative for color change and rash.  Neurological: Negative for dizziness, seizures, syncope, weakness, light-headedness, numbness and headaches.  All other systems reviewed and  are negative.    Physical Exam Updated Vital Signs BP (!) 159/89   Pulse 76   Temp 97.8 F (36.6 C) (Oral)   Resp 17   Ht 5' 4.5" (1.638 m)   Wt 72.6 kg   LMP 01/26/2000   SpO2 99%   BMI 27.04 kg/m   Physical Exam Vitals signs and nursing note reviewed.  Constitutional:      General: She is not in acute distress.    Appearance: She is well-developed.     Comments: Teary eyed during interview.  HENT:     Head: Normocephalic and atraumatic.     Mouth/Throat:     Pharynx: No oropharyngeal exudate.  Eyes:     Pupils: Pupils are equal, round, and reactive to light.  Neck:     Musculoskeletal: Normal range of motion.  Cardiovascular:     Rate and Rhythm: Regular rhythm.     Heart sounds: Normal heart sounds.  Pulmonary:     Effort: Pulmonary effort is normal. No respiratory distress.     Breath sounds: Normal breath sounds. No decreased breath sounds.  Abdominal:     General: Bowel sounds are decreased. There is no distension.     Palpations: Abdomen is soft.     Tenderness: There is no abdominal tenderness. There is no right CVA tenderness or left CVA tenderness.  Musculoskeletal:        General: No tenderness or deformity.     Right lower leg: No edema.     Left lower leg: No edema.  Skin:    General: Skin is warm and dry.  Neurological:     Mental Status: She is alert and oriented to person, place, and time.     Comments:  Strength is symmetric bilaterally, no weakness of upper or lower extremities.  No dysarthria, no facial asymmetry.  Patient speaking in full sentences, no deficit appreciated.      ED Treatments / Results  Labs (all labs ordered are listed, but only abnormal results are displayed) Labs Reviewed  BASIC METABOLIC PANEL  CBC WITH DIFFERENTIAL/PLATELET  TROPONIN I  RAPID URINE DRUG SCREEN, HOSP PERFORMED  I-STAT TROPONIN, ED    EKG EKG Interpretation  Date/Time:  Saturday October 28 2018 13:46:12 EST Ventricular Rate:  67 PR Interval:    QRS Duration: 81 QT Interval:  408 QTC Calculation: 431 R Axis:   48 Text Interpretation:  Sinus rhythm Right atrial enlargement No significant change since last tracing Confirmed by Richardean Canal [phone]) on 10/28/2018 2:27:44 PM   Radiology Ct Angio Head W Or Wo Contrast  Result Date: 10/28/2018 CLINICAL DATA:  53 year old female with chest pain numbness and tingling. EXAM: CT ANGIOGRAPHY HEAD TECHNIQUE: Multidetector CT imaging of the head was performed using the standard protocol during bolus administration of intravenous contrast. Multiplanar CT image reconstructions and MIPs were obtained to evaluate the vascular anatomy. CONTRAST:  56mL ISOVUE-300 IOPAMIDOL (ISOVUE-300) INJECTION 61% COMPARISON:  Head CT without contrast 08/16/2015. Brain MRI and intracranial MRA 09/03/2014. CTA head 09/03/2014. FINDINGS: CT HEAD Brain: Normal cerebral volume. No midline shift, ventriculomegaly, mass effect, evidence of mass lesion, intracranial hemorrhage or evidence of cortically based acute infarction. Gray-white matter differentiation is within normal limits throughout the brain. Calvarium and skull base: Negative. Paranasal sinuses: Stable and well pneumatized aside from mild bubbly opacity in the left sphenoid. Tympanic cavities and mastoids are clear. Orbits: Negative orbit and scalp soft tissues. CTA HEAD Posterior circulation: Codominant distal vertebral  arteries are patent  to the basilar without stenosis. Patent PICA origins. Patent basilar artery without stenosis. SCA and PCA origins are within normal limits. Posterior communicating arteries are diminutive or absent. Bilateral PCA branches are within normal limits. Anterior circulation: Both ICA siphons are patent. There is no left ICA siphon stenosis, but there is a small 2-3 millimeter superiorly directed left paraophthalmic artery aneurysm (series 11, image 106). This is stable since the 2015 MRA. The right ICA siphon is patent without stenosis, with a similar appearing small right paraophthalmic artery aneurysm which is also stable since the prior MRA (series 11, image 86). Patent carotid termini. MCA and ACA origins are within normal limits. Mildly non dominant left A1 segment. Bilateral ACA branches are within normal limits. Left MCA M1 segment is mildly tortuous. Left MCA bifurcation and left MCA branches are stable and within normal limits. Right MCA M1 segment is also mildly tortuous. Right MCA bifurcation and right MCA branches are stable and within normal limits. Venous sinuses: Patent on the delayed images. Anatomic variants: Mildly non dominant left ACA A1 segment. Delayed phase: No abnormal enhancement identified. Review of the MIP images confirms the above findings IMPRESSION: 1. Negative for intracranial large vessel occlusion, and normal CT appearance of the brain. 2. 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 Electronically Signed   By: Odessa FlemingH  Hall M.D.   On: 10/28/2018 17:28   Dg Chest 2 View  Result Date: 10/28/2018 CLINICAL DATA:  Patient with chest pain. EXAM: CHEST - 2 VIEW COMPARISON:  Chest radiograph 02/06/2018 FINDINGS: Monitoring leads overlie the patient. Normal cardiac and mediastinal contours. No consolidative pulmonary opacities. No pleural effusion or pneumothorax. Regional skeleton is unremarkable. IMPRESSION: No acute cardiopulmonary  process. Electronically Signed   By: Annia Beltrew  Davis M.D.   On: 10/28/2018 15:29    Procedures Procedures (including critical care time)  Medications Ordered in ED Medications  iopamidol (ISOVUE-300) 61 % injection (50 mLs  Contrast Given 10/28/18 1646)  ketorolac (TORADOL) 30 MG/ML injection 30 mg (30 mg Intramuscular Given 10/28/18 1815)     Initial Impression / Assessment and Plan / ED Course  I have reviewed the triage vital signs and the nursing notes.  Pertinent labs & imaging results that were available during my care of the patient were reviewed by me and considered in my medical decision making (see chart for details).     Patient with a previous history of brain aneurysm, currently in outpatient detox for crack cocaine for the past 9 days, presents with chest pain, right and left hand tingling.   During evaluation patient is teary eyed, currently trying outpatient rehab as she states she cares for her 55 year old at home.  Work-up in the ED so far has been unremarkable, CBC showed no leukocytosis, hemoglobin is within normal limits.  BMP shows no electrolyte abnormality, creatinine is within normal limits.  First troponin was negative.  EKG showed no changes consistent with infarct or STEMI.  DG Chest showed: No acute cardiopulmonary process. Low suspicion for ACS ad patient's patient seems more from cocaine withdrawal. HEART Score is 2. Patient does have a previous history of brain aneurysm, CT angios head showed: 1. Negative for intracranial large vessel occlusion, and normal CT  appearance of the brain.  2. Small chronic 2-3 mm bilateral ICA Paraophthalmic Artery  Aneurysms are stable since 2015.   Second troponin was negative, will have patient follow up.  She reports having a schedule appointment with her PCP on Tuesday,  have advised her she may request some medication from her PCP to help with her withdrawal symptoms.  No further emergent work-up warranted.  Return precautions  provided at length.    Final Clinical Impressions(s) / ED Diagnoses   Final diagnoses:  Atypical chest pain    ED Discharge Orders    None       Claude MangesSoto, Kynnadi Dicenso, Cordelia Poche-C 10/28/18 1926    Charlynne PanderYao, David Hsienta, MD 10/30/18 [phone]

## 2018-10-28 NOTE — ED Notes (Signed)
Pt alert and oriented in NAD. Pt verbalized understanding of discharge instructions. 

## 2018-10-28 NOTE — ED Notes (Signed)
Patient transported to X-ray 

## 2018-10-31 ENCOUNTER — Ambulatory Visit: Payer: Self-pay | Admitting: Family Medicine

## 2018-11-22 ENCOUNTER — Emergency Department (HOSPITAL_COMMUNITY)
Admission: EM | Admit: 2018-11-22 | Discharge: 2018-11-22 | Disposition: A | Discharge status: Home / Self Care | Payer: No Typology Code available for payment source | Attending: Emergency Medicine | Admitting: Emergency Medicine

## 2018-11-22 ENCOUNTER — Encounter (HOSPITAL_COMMUNITY): Payer: Self-pay | Admitting: *Deleted

## 2018-11-22 DIAGNOSIS — I10 Essential (primary) hypertension: Secondary | ICD-10-CM | POA: Insufficient documentation

## 2018-11-22 DIAGNOSIS — M19042 Primary osteoarthritis, left hand: Secondary | ICD-10-CM

## 2018-11-22 DIAGNOSIS — M19041 Primary osteoarthritis, right hand: Secondary | ICD-10-CM | POA: Insufficient documentation

## 2018-11-22 DIAGNOSIS — E039 Hypothyroidism, unspecified: Secondary | ICD-10-CM | POA: Insufficient documentation

## 2018-11-22 DIAGNOSIS — F17210 Nicotine dependence, cigarettes, uncomplicated: Secondary | ICD-10-CM | POA: Insufficient documentation

## 2018-11-22 DIAGNOSIS — Z79899 Other long term (current) drug therapy: Secondary | ICD-10-CM | POA: Insufficient documentation

## 2018-11-22 MED ORDER — PREDNISONE 10 MG (21) PO TBPK: ORAL_TABLET | Freq: Every day | ORAL | 0 refills | Status: DC

## 2018-11-22 NOTE — ED Provider Notes (Signed)
[facility] EMERGENCY DEPARTMENT Provider Note   CSN: [id number] Arrival date & time: 11/22/18  0715    History   Chief Complaint Chief Complaint  Patient presents with  . Hand Pain    [name] is a 53 y.o. female.     53 year old female presents with pain in bilateral hands.  Patient states she has had pain for the past several months, progressively worsening, limited relief with Aleve arthritis.  Patient denies any falls or injuries, is unemployed and denies repetitive work however states pain is worse with trying to mop or hold small objects.  Patient denies weakness or dropping objects.  Pain is worse at night, reports waking up feeling like her entire hands are numb.  Patient's mother has a history of rheumatoid arthritis, patient states she has known arthritis in her hips, states she was supposed to have hip surgery however due to previous history of substance abuse was unable to have surgery at that time. No other complaints or concerns.      Past Medical History:  Diagnosis Date  . Abnormal PFT 01/2018  . Anxiety   . Arthritis   . Brain aneurysm   . Chronic back pain   . Depression    history of  . Grave's disease 2007   TSH (08/31/2010) = 0.186 (low), free T4 = 0.91 (WNL)  . History of substance abuse (HCC)    remote past  . Hypertension    cardiology consult 01/2018, Dr. Sharyn Lull  . Postsurgical hypothyroidism   . Raynaud disease 2007  . Smoker     Patient Active Problem List   Diagnosis Date Noted  . Abnormal thyroid function test 03/03/2018  . Chronic obstructive pulmonary disease (HCC) 03/03/2018  . Breast calcifications 03/03/2018  . Right hip pain 03/03/2018  . Substance abuse (HCC) 03/03/2018  . Need for Tdap vaccination 02/03/2018  . Preop examination 02/03/2018  . Abnormal PFT 02/03/2018  . Hypothyroidism 02/03/2018  . Spondylolisthesis, lumbar region 01/18/2017  . Cocaine dependence (HCC) 07/25/2015  .  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/25/2015  . Hx of non anemic vitamin B12 deficiency 11/08/2014  . Brain aneurysm 09/09/2014  . Smoker 07/22/2014  . Rhinitis, allergic 07/22/2014  . Breast pain, right 07/09/2014  . Chest pain, unspecified 07/02/2013  . Chronic low back pain with sciatica 01/24/2013  . Health care maintenance 10/16/2012  . Hot flashes 02/08/2012  . Post-surgical hypothyroidism 12/07/2011  . Fibromyalgia muscle pain 03/31/2011  . Migraine 03/31/2011  . Essential hypertension, benign 11/14/2008  . DEPRESSION 08/10/2006    Past Surgical History:  Procedure Laterality Date  . ABDOMINAL HYSTERECTOMY     due to fibroids, still has ovaries  . COLONOSCOPY  02/14/2008   done by Dr. Christella Hartigan - small colonic polyp identified, 6 mm in size and per biopsy --> tubular adenoma with no malignancy or high grade dysplasia noted  . THYROIDECTOMY  03/08/2012   [facility]; history of Graves' disease with multinodular goiter  . TONSILLECTOMY    . WISDOM TOOTH EXTRACTION       OB History    Gravida  3   Para  3   Term  2   Preterm  1   AB  0   Living  2     SAB  0   TAB      Ectopic      Multiple      Live Births  Home Medications    Prior to Admission medications   Medication Sig Start Date End Date Taking? Authorizing Provider  amitriptyline (ELAVIL) 100 MG tablet Take 100 mg by mouth at bedtime.    [provider]  amoxicillin (AMOXIL) 875 MG tablet Take 1 tablet (875 mg total) by mouth 2 (two) times daily. Patient not taking: Reported on 10/25/2018 03/03/18   Tysinger, Kermit Balo, PA-C  fluticasone [facility]) 50 MCG/ACT nasal spray Place 2 sprays into both nostrils daily. 10/25/18   Cathie Hoops, Amy V, PA-C  fluticasone furoate-vilanterol (BREO ELLIPTA) 100-25 MCG/INH AEPB Inhale 1 puff into the lungs daily. 02/08/18   Tysinger, Kermit Balo, PA-C  levothyroxine (SYNTHROID, LEVOTHROID) 150 MCG tablet Take 150 mcg by mouth daily before breakfast.   01/23/18   [provider]  losartan (COZAAR) 50 MG tablet Take 50 mg by mouth daily. 01/23/18   [provider]  metoprolol succinate (TOPROL XL) 100 MG 24 hr tablet Take 1 tablet (100 mg total) by mouth daily. Take with or immediately following a meal. 03/03/18   Tysinger, Kermit Balo, PA-C  oxyCODONE-acetaminophen (PERCOCET/ROXICET) 5-325 MG tablet Take 1 tablet by mouth every 4 (four) hours as needed for severe pain. 02/16/18   Kerrin Champagne, MD  predniSONE (STERAPRED UNI-PAK 21 TAB) 10 MG (21) TBPK tablet Take by mouth daily. Take 6 tabs by mouth daily  for 2 days, then 5 tabs for 2 days, then 4 tabs for 2 days, then 3 tabs for 2 days, 2 tabs for 2 days, then 1 tab by mouth daily for 2 days 11/22/18   Jeannie Fend, PA-C    Family History Family History  Problem Relation Age of Onset  . Colon cancer Maternal Grandfather   . Cancer Maternal Grandfather        prostate  . Hypertension Mother   . Thyroid disease Mother   . Lupus Mother   . Hypertension Father     Social History Social History   Tobacco Use  . Smoking status: Current Every Day Smoker    Packs/day: 0.20    Years: 35.00    Pack years: 7.00    Types: Cigarettes  . Smokeless tobacco: Never Used  Substance Use Topics  . Alcohol use: No    Alcohol/week: 0.0 standard drinks    Comment: sober for several years  . Drug use: Not Currently    Types: Cocaine, Marijuana    Comment: pt reports last use 40 days ago     Allergies   Adhesive [tape]; Morphine and related; and Vicodin [hydrocodone-acetaminophen]   Review of Systems Review of Systems  Constitutional: Negative for fever.  Musculoskeletal: Positive for arthralgias, joint swelling and myalgias.  Skin: Negative for color change, rash and wound.  Allergic/Immunologic: Negative for immunocompromised state.  Neurological: Positive for numbness. Negative for dizziness and weakness.  Hematological: Negative for adenopathy. Does not bruise/bleed  easily.  Psychiatric/Behavioral: Negative for confusion.  All other systems reviewed and are negative.    Physical Exam Updated Vital Signs BP (!) 168/92 (BP Location: Right Arm)   Pulse 80   Temp 98 F (36.7 C) (Oral)   Resp 16   Ht 5\' 4"  (1.626 m)   Wt 74.8 kg   LMP 01/26/2000   SpO2 100%   BMI 28.32 kg/m   Physical Exam Vitals signs and nursing note reviewed.  Constitutional:      General: She is not in acute distress.    Appearance: She is well-developed. She is not  diaphoretic.  HENT:     Head: Normocephalic and atraumatic.  Cardiovascular:     Pulses: Normal pulses.  Pulmonary:     Effort: Pulmonary effort is normal.  Musculoskeletal:        General: Tenderness present.     Comments: Chronic appearing swelling/deformity to MCPs and PIPs both hands, no erythema, no wounds  Skin:    General: Skin is warm and dry.     Findings: No erythema or rash.  Neurological:     Mental Status: She is alert and oriented to person, place, and time.     Sensory: Sensation is intact. No sensory deficit.     Motor: No weakness.  Psychiatric:        Behavior: Behavior normal.      ED Treatments / Results  Labs (all labs ordered are listed, but only abnormal results are displayed) Labs Reviewed - No data to display  EKG None  Radiology No results found.  Procedures Procedures (including critical care time)  Medications Ordered in ED Medications - No data to display   Initial Impression / Assessment and Plan / ED Course  I have reviewed the triage vital signs and the nursing notes.  Pertinent labs & imaging results that were available during my care of the patient were reviewed by me and considered in my medical decision making (see chart for details).  Clinical Course as of Nov 22 799  Wed Nov 22, 2018  [id number] 53 year old female with complaint of pain and stiffness to her hands for the past month.  On exam appears to have chronic swelling/deformity to MCPs and  PIPs of both hands, sensation intact, normal capillary refill.  No erythema or skin changes.  Discussed with patient possible arthritis versus rheumatoid arthritis.  Patient is scheduled to follow-up with her PCP in the next few weeks to establish care and we will discuss further at that time.  Patient given short course of prednisone.   [LM]    Clinical Course User Index [LM] Jeannie Fend, PA-C    Final Clinical Impressions(s) / ED Diagnoses   Final diagnoses:  Arthritis of both hands    ED Discharge Orders         Ordered    predniSONE (STERAPRED UNI-PAK 21 TAB) 10 MG (21) TBPK tablet  Daily     11/22/18 0747           Jeannie Fend, PA-C 11/22/18 1610    Pricilla Loveless, MD 12/04/18 [phone]

## 2018-11-22 NOTE — ED Triage Notes (Signed)
Pt in c/o numbness, tingling, stabbing to bil hands x 1 mth, pt reports swelling with no swelling present at this time, pt able to wiggle all fingers bilaterally, skin intact

## 2018-11-22 NOTE — Discharge Instructions (Addendum)
Take prednisone as prescribed and complete the full course. Follow up with your doctor as planned.

## 2018-11-29 ENCOUNTER — Encounter: Payer: Self-pay | Admitting: Family Medicine

## 2018-11-29 ENCOUNTER — Other Ambulatory Visit: Payer: Self-pay

## 2018-11-29 ENCOUNTER — Ambulatory Visit (INDEPENDENT_AMBULATORY_CARE_PROVIDER_SITE_OTHER): Payer: Self-pay | Admitting: Family Medicine

## 2018-11-29 VITALS — BP 163/92 | HR 80 | Temp 98.3°F | Resp 17 | Ht 64.0 in | Wt 164.4 lb

## 2018-11-29 DIAGNOSIS — M79642 Pain in left hand: Secondary | ICD-10-CM

## 2018-11-29 DIAGNOSIS — M79641 Pain in right hand: Secondary | ICD-10-CM

## 2018-11-29 DIAGNOSIS — H6982 Other specified disorders of Eustachian tube, left ear: Secondary | ICD-10-CM

## 2018-11-29 DIAGNOSIS — Z7689 Persons encountering health services in other specified circumstances: Secondary | ICD-10-CM

## 2018-11-29 MED ORDER — METHYLPREDNISOLONE SODIUM SUCC 125 MG IJ SOLR
62.5000 mg | Freq: Once | INTRAMUSCULAR | Status: AC
  Administered 2018-11-29: 62.5 mg via INTRAMUSCULAR

## 2018-11-29 MED ORDER — KETOROLAC TROMETHAMINE 30 MG/ML IJ SOLN
30.0000 mg | Freq: Once | INTRAMUSCULAR | Status: AC
  Administered 2018-11-29: 30 mg via INTRAMUSCULAR

## 2018-11-29 MED ORDER — CYCLOBENZAPRINE HCL 10 MG PO TABS: 10.0000 mg | ORAL_TABLET | Freq: Three times a day (TID) | ORAL | 0 refills | Status: DC | PRN

## 2018-11-29 MED ORDER — PREDNISONE 20 MG PO TABS: ORAL_TABLET | ORAL | 0 refills | Status: DC

## 2018-11-29 MED ORDER — CETIRIZINE HCL 10 MG PO TABS: 10.0000 mg | ORAL_TABLET | Freq: Every day | ORAL | 11 refills | Status: DC

## 2018-11-29 MED ORDER — FLUTICASONE PROPIONATE 50 MCG/ACT NA SUSP: 2.0000 | Freq: Every day | NASAL | 6 refills | Status: DC

## 2018-11-29 MED FILL — predniSONE 20 MG TABS: 20 | 9 days supply | Qty: 18 | Fill #0

## 2018-11-29 MED FILL — CYCLOBENZAPRINE 10 MG TAB: 10 | 10 days supply | Qty: 30 | Fill #0

## 2018-11-29 MED FILL — FLUTICASONE PROP 50 MCG SPR: 50 | 20 days supply | Qty: 16 | Fill #0

## 2018-11-29 NOTE — Patient Instructions (Addendum)
Thank you for choosing Primary Care at [facility] to be your medical home!    Amy [name] was seen by Joaquin Courts, FNP today.   Riccardo Dubin Livesey's primary care provider is Bing Neighbors, FNP.   For the best care possible, you should try to see Joaquin Courts, FNP-C whenever you come to the clinic.   We look forward to seeing you again soon!  If you have any questions about your visit today, please call us at [phone] or feel free to reach your primary care provider via MyChart.      Hand Pain Many things can cause hand pain. Some common causes are:  An injury.  Repeating the same movement with your hand over and over (overuse).  Osteoporosis.  Arthritis.  Lumps in the tendons or joints of the hand and wrist (ganglion cysts).  Infection. Follow these instructions at home: Pay attention to any changes in your symptoms. Take these actions to help with your discomfort:  If directed, put ice on the affected area: ? Put ice in a plastic bag. ? Place a towel between your skin and the bag. ? Leave the ice on for 15-20 minutes, 3?4 times a day for 2 days.  Take over-the-counter and prescription medicines only as told by your health care provider.  Minimize stress on your hands and wrists as much as possible.  Take breaks from repetitive activity often.  Do stretches as told by your health care provider.  Do not do activities that make your pain worse. Contact a health care provider if:  Your pain does not get better after a few days of self-care.  Your pain gets worse.  Your pain affects your ability to do your daily activities. Get help right away if:  Your hand becomes warm, red, or swollen.  Your hand is numb or tingling.  Your hand is extremely swollen or deformed.  Your hand or fingers turn white or blue.  You cannot move your hand, wrist, or fingers. This information is not intended to replace advice given to you by your health care  provider. Make sure you discuss any questions you have with your health care provider. Document Released: 10/03/2015 Document Revised: 02/12/2016 Document Reviewed: 10/02/2014 Elsevier Interactive Patient Education  2019 Elsevier Inc.       Eustachian Tube Dysfunction  Eustachian tube dysfunction refers to a condition in which a blockage develops in the narrow passage that connects the middle ear to the back of the nose (eustachian tube). The eustachian tube regulates air pressure in the middle ear by letting air move between the ear and nose. It also helps to drain fluid from the middle ear space. Eustachian tube dysfunction can affect one or both ears. When the eustachian tube does not function properly, air pressure, fluid, or both can build up in the middle ear. What are the causes? This condition occurs when the eustachian tube becomes blocked or cannot open normally. Common causes of this condition include:  Ear infections.  Colds and other infections that affect the nose, mouth, and throat (upper respiratory tract).  Allergies.  Irritation from cigarette smoke.  Irritation from stomach acid coming up into the esophagus (gastroesophageal reflux). The esophagus is the tube that carries food from the mouth to the stomach.  Sudden changes in air pressure, such as from descending in an airplane or scuba diving.  Abnormal growths in the nose or throat, such as: ? Growths that line the nose (nasal polyps). ? Abnormal  growth of cells (tumors). ? Enlarged tissue at the back of the throat (adenoids). What increases the risk? You are more likely to develop this condition if:  You smoke.  You are overweight.  You are a child who has: ? Certain birth defects of the mouth, such as cleft palate. ? Large tonsils or adenoids. What are the signs or symptoms? Common symptoms of this condition include:  A feeling of fullness in the ear.  Ear pain.  Clicking or popping noises in  the ear.  Ringing in the ear.  Hearing loss.  Loss of balance.  Dizziness. Symptoms may get worse when the air pressure around you changes, such as when you travel to an area of high elevation, fly on an airplane, or go scuba diving. How is this diagnosed? This condition may be diagnosed based on:  Your symptoms.  A physical exam of your ears, nose, and throat.  Tests, such as those that measure: ? The movement of your eardrum (tympanogram). ? Your hearing (audiometry). How is this treated? Treatment depends on the cause and severity of your condition.  In mild cases, you may relieve your symptoms by moving air into your ears. This is called "popping the ears."  In more severe cases, or if you have symptoms of fluid in your ears, treatment may include: ? Medicines to relieve congestion (decongestants). ? Medicines that treat allergies (antihistamines). ? Nasal sprays or ear drops that contain medicines that reduce swelling (steroids). ? A procedure to drain the fluid in your eardrum (myringotomy). In this procedure, a small tube is placed in the eardrum to:  Drain the fluid.  Restore the air in the middle ear space. ? A procedure to insert a balloon device through the nose to inflate the opening of the eustachian tube (balloon dilation). Follow these instructions at home: Lifestyle  Do not do any of the following until your health care provider approves: ? Travel to high altitudes. ? Fly in airplanes. ? Work in a Estate agent or room. ? Scuba dive.  Do not use any products that contain nicotine or tobacco, such as cigarettes and e-cigarettes. If you need help quitting, ask your health care provider.  Keep your ears dry. Wear fitted earplugs during showering and bathing. Dry your ears completely after. General instructions  Take over-the-counter and prescription medicines only as told by your health care provider.  Use techniques to help pop your ears as  recommended by your health care provider. These may include: ? Chewing gum. ? Yawning. ? Frequent, forceful swallowing. ? Closing your mouth, holding your nose closed, and gently blowing as if you are trying to blow air out of your nose.  Keep all follow-up visits as told by your health care provider. This is important. Contact a health care provider if:  Your symptoms do not go away after treatment.  Your symptoms come back after treatment.  You are unable to pop your ears.  You have: ? A fever. ? Pain in your ear. ? Pain in your head or neck. ? Fluid draining from your ear.  Your hearing suddenly changes.  You become very dizzy.  You lose your balance. Summary  Eustachian tube dysfunction refers to a condition in which a blockage develops in the eustachian tube.  It can be caused by ear infections, allergies, inhaled irritants, or abnormal growths in the nose or throat.  Symptoms include ear pain, hearing loss, or ringing in the ears.  Mild cases are treated with maneuvers  to unblock the ears, such as yawning or ear popping.  Severe cases are treated with medicines. Surgery may also be done (rare). This information is not intended to replace advice given to you by your health care provider. Make sure you discuss any questions you have with your health care provider. Document Released: 10/03/2015 Document Revised: 12/27/2017 Document Reviewed: 12/27/2017 Elsevier Interactive Patient Education  2019 [employer].

## 2018-11-29 NOTE — Progress Notes (Signed)
Amy [name], is a 53 y.o. female  VXY:[id number]  MOL:[id number]  DOB - [birth year]  CC:  Chief Complaint  Patient presents with  . Establish Care  . Follow-up    ED 3/4: B hand pain. still having the pain/numbness/itching       HPI: Amy [name] is a 53 y.o. female , current daily smoker, is here today to establish care and evaluation of bilateral hand pain and swelling with paranesthesia.  Andy MASSEY VOLK has DEPRESSION; Essential hypertension, benign; Fibromyalgia muscle pain; Migraine; Post-surgical hypothyroidism; Hot flashes; Health care maintenance; Chronic low back pain with sciatica; Smoker; Rhinitis, allergic; Brain aneurysm; Hx of non anemic vitamin B12 deficiency; Cocaine dependence (HCC); Cocaine-induced mood disorder (HCC); Spondylolisthesis, lumbar region; Preop examination; Abnormal PFT; Hypothyroidism; Abnormal thyroid function test; Chronic obstructive pulmonary disease (HCC); Right hip pain; and Substance abuse (HCC) on their problem list.   Patient is a recovering substance abuse addict and is currently actively in outpatient drug rehabilitation. She reports being clean for greater than one month.  Bilateral hand pain and swelling Onset of problem 2 months ago. She presented to urgent care with this problem on 11/22/18.  Denies hand injury or hand swelling occurring previously in the past. She was prescribed prednisone taper urgent care provider but reports today she never attempted to pick up medication due to cost. Over-the-counter ibuprofen or Tylenol for pain. She reports that pain is 10 out of 10 only. Concern for rheumatoid arthritis as she reports a non-first-degree family member suffers from rheumatoid. The family history is significant for mother with Lupus. Reports pain is generalized and not localized to the joints in her hands. Reports tenderness with gripping and extending fingers. Numbness and tingling occurs periodically.Denies weakness or dropping objects. She denies  wrist pain or arm pain. Second concern is bilateral ear pain. She endorses associated nasal congestion. No prior diagnoses of seasonal allergies.  Current medications: Current Outpatient Medications:  .  levothyroxine (SYNTHROID, LEVOTHROID) 125 MCG tablet, Take 125 mcg by mouth daily., Disp: , Rfl:  .  losartan (COZAAR) 100 MG tablet, Take 100 mg by mouth daily., Disp: , Rfl:  .  metoprolol succinate (TOPROL-XL) 50 MG 24 hr tablet, Take 50 mg by mouth daily., Disp: , Rfl:    Pertinent family medical history: family history includes Cancer in her maternal grandfather; Colon cancer in her maternal grandfather; Hypertension in her father and mother; Lupus in her mother; Thyroid disease in her mother.   Allergies  Allergen Reactions  . Adhesive [Tape] Other (See Comments)    Pulls skin off.  . Morphine And Related Other (See Comments)    Does not like feeling  . Vicodin [Hydrocodone-Acetaminophen]     Social History   Socioeconomic History  . Marital status: Single    Spouse name: Not on file  . Number of children: Not on file  . Years of education: Not on file  . Highest education level: Not on file  Occupational History  . Not on file  Social Needs  . Financial resource strain: Not on file  . Food insecurity:    Worry: Not on file    Inability: Not on file  . Transportation needs:    Medical: Not on file    Non-medical: Not on file  Tobacco Use  . Smoking status: Current Every Day Smoker    Packs/day: 0.20    Years: 35.00    Pack years: 7.00    Types: Cigarettes  . Smokeless tobacco: Never Used  Substance  and Sexual Activity  . Alcohol use: No    Alcohol/week: 0.0 standard drinks    Comment: sober for several years  . Drug use: Not Currently    Types: Cocaine, Marijuana    Comment: pt reports last use 40 days ago  . Sexual activity: Not on file  Lifestyle  . Physical activity:    Days per week: Not on file    Minutes per session: Not on file  . Stress: Not on  file  Relationships  . Social connections:    Talks on phone: Not on file    Gets together: Not on file    Attends religious service: Not on file    Active member of club or organization: Not on file    Attends meetings of clubs or organizations: Not on file    Relationship status: Not on file  . Intimate partner violence:    Fear of current or ex partner: Not on file    Emotionally abused: Not on file    Physically abused: Not on file    Forced sexual activity: Not on file  Other Topics Concern  . Not on file  Social History Narrative   Lives with grandson.   Works as Psychologist, clinical.  01/2018.     Financial assistance approved for 80% discount at [facility] and has [facility] card per Rudell Cobb   07/24/2010       Review of Systems: Pertinent negatives listed in HPI Objective:   Vitals:   11/29/18 1415  BP: (!) 163/92  Pulse: 80  Resp: 17  Temp: 98.3 F (36.8 C)  SpO2: 96%    BP Readings from Last 3 Encounters:  11/29/18 (!) 163/92  11/22/18 (!) 168/92  10/28/18 (!) 156/91    Filed Weights   11/29/18 1415  Weight: 164 lb 6.4 oz (74.6 kg)      Physical Exam: Constitutional: Patient appears well-developed and well-nourished. No distress. HENT: Normocephalic, atraumatic, External right and left ear normal-TM visible,no abnormal findings.  Eyes: Conjunctivae and EOM are normal. PERRLA, no scleral icterus. Neck: Normal ROM. Neck supple. No JVD. No tracheal deviation. No thyromegaly. CVS: RRR, S1/S2 +, no murmurs, no gallops, no carotid bruit.  Pulmonary: Effort and breath sounds normal, no stridor, rhonchi, wheezes, rales.  Abdominal: Soft. BS +, no distension, tenderness, rebound or guarding.  Musculoskeletal: Normal range of motion. No edema and no tenderness. Bilateral hand grips symmetrical, no weakness. Bilateral hands, trace edema. No pronounced DIP or PIP swelling or joint tenderness. Neuro: Alert. Normal muscle tone coordination. Normal gait. No cranial nerve  deficit. Skin: Skin is warm and dry. No rash noted. Not diaphoretic. No erythema. No pallor. Psychiatric: Hyperactive and rapid speech throughout exam. She was cooperative and thought content normal.  Lab Results (prior encounters)  Lab Results  Component Value Date   WBC 5.8 10/28/2018   HGB 12.7 10/28/2018   HCT 40.5 10/28/2018   MCV 91.4 10/28/2018   PLT 224 10/28/2018   Lab Results  Component Value Date   CREATININE 0.79 10/28/2018   BUN 7 10/28/2018   NA 142 10/28/2018   K 3.8 10/28/2018   CL 108 10/28/2018   CO2 22 10/28/2018      Component Value Date/Time   CHOL 174 07/02/2013 1645   TRIG 75 07/02/2013 1645   HDL 55 07/02/2013 1645   CHOLHDL 3.2 07/02/2013 1645   VLDL 15 07/02/2013 1645   LDLCALC 104 (H) 07/02/2013 1645  Assessment and plan:  1. Encounter to establish care 2. Bilateral hand pain  - Sedimentation Rate - Uric A+ANA+CRP+RF Qn - Vitamin B12 - Comprehensive metabolic panel - CBC with Differential - ketorolac (TORADOL) 30 MG/ML injection 30 mg - methylPREDNISolone sodium succinate (SOLU-MEDROL) 125 mg/2 mL injection 62.5 mg -Trial prednisone taper (patient never started previously) and cyclobenzaprine for pain. If no improvement, will refer to orthopedics. Financial packet provided.  3. Eustachian tube dysfunction,  Prednisone should improve symptoms -Trial Flonase and Cetrizine. Explained to use daily and consistently for least 30 days.   BP elevated today. Patient reports that she has chronic medications and taking medications consistently. Will adjust medications if BP is elevated at next visit.  Meds ordered this encounter  Medications  . predniSONE (DELTASONE) 20 MG tablet    Sig: Take 3 PO QAM x3days, 2 PO QAM x3days, 1 PO QAM x3days    Dispense:  18 tablet    Refill:  0  . cyclobenzaprine (FLEXERIL) 10 MG tablet    Sig: Take 1 tablet (10 mg total) by mouth 3 (three) times daily as needed (hand pain and numbness and tingling).     Dispense:  30 tablet    Refill:  0  . fluticasone (FLONASE) 50 MCG/ACT nasal spray    Sig: Place 2 sprays into both nostrils daily.    Dispense:  16 g    Refill:  6  . cetirizine (ZYRTEC) 10 MG tablet    Sig: Take 1 tablet (10 mg total) by mouth daily.    Dispense:  30 tablet    Refill:  11  . ketorolac (TORADOL) 30 MG/ML injection 30 mg  . methylPREDNISolone sodium succinate (SOLU-MEDROL) 125 mg/2 mL injection 62.5 mg    Return in about 6 weeks (around 01/10/2019) for Complete Physical Exam.   The patient was given clear instructions to go to ER or return to medical center if symptoms don't improve, worsen or new problems develop. The patient verbalized understanding. The patient was advised  to call and obtain lab results if they haven't heard anything from out office within 7-10 business days.  Joaquin Courts, FNP Primary Care at [facility] [address] [place] [address] [phone]: [phone]    This note has been created with Dragon speech recognition software and Paediatric nurse. Any transcriptional errors are unintentional.

## 2018-11-30 LAB — CBC WITH DIFFERENTIAL/PLATELET
BASOS: 1 %
Basophils Absolute: 0 10*3/uL (ref 0.0–0.2)
EOS (ABSOLUTE): 0.1 10*3/uL (ref 0.0–0.4)
Eos: 2 %
Hematocrit: 39.4 % (ref 34.0–46.6)
Hemoglobin: 12.8 g/dL (ref 11.1–15.9)
Immature Grans (Abs): 0 10*3/uL (ref 0.0–0.1)
Immature Granulocytes: 0 %
LYMPHS: 46 %
Lymphocytes Absolute: 2.2 10*3/uL (ref 0.7–3.1)
MCH: 29.8 pg (ref 26.6–33.0)
MCHC: 32.5 g/dL (ref 31.5–35.7)
MCV: 92 fL (ref 79–97)
Monocytes Absolute: 0.4 10*3/uL (ref 0.1–0.9)
Monocytes: 8 %
NEUTROS PCT: 43 %
Neutrophils Absolute: 2 10*3/uL (ref 1.4–7.0)
Platelets: 227 10*3/uL (ref 150–450)
RBC: 4.3 x10E6/uL (ref 3.77–5.28)
RDW: 12.4 % (ref 11.7–15.4)
WBC: 4.8 10*3/uL (ref 3.4–10.8)

## 2018-11-30 LAB — COMPREHENSIVE METABOLIC PANEL
ALT: 15 IU/L (ref 0–32)
AST: 19 IU/L (ref 0–40)
Albumin/Globulin Ratio: 1.7 (ref 1.2–2.2)
Albumin: 4.3 g/dL (ref 3.8–4.9)
Alkaline Phosphatase: 109 IU/L (ref 39–117)
BUN/Creatinine Ratio: 18 (ref 9–23)
BUN: 12 mg/dL (ref 6–24)
Bilirubin Total: 0.6 mg/dL (ref 0.0–1.2)
CO2: 24 mmol/L (ref 20–29)
CREATININE: 0.67 mg/dL (ref 0.57–1.00)
Calcium: 9.7 mg/dL (ref 8.7–10.2)
Chloride: 105 mmol/L (ref 96–106)
GFR calc Af Amer: 115 mL/min/{1.73_m2} (ref >59)
GFR calc non Af Amer: 100 mL/min/{1.73_m2} (ref >59)
Globulin, Total: 2.6 g/dL (ref 1.5–4.5)
Glucose: 80 mg/dL (ref 65–99)
Potassium: 4.6 mmol/L (ref 3.5–5.2)
Sodium: 145 mmol/L — ABNORMAL HIGH (ref 134–144)
Total Protein: 6.9 g/dL (ref 6.0–8.5)

## 2018-11-30 LAB — VITAMIN B12: Vitamin B-12: >2000 pg/mL — ABNORMAL HIGH (ref 232–1245)

## 2018-11-30 LAB — URIC A+ANA+CRP+RF QN
Anti Nuclear Antibody(ANA): NEGATIVE
CRP: 4 mg/L (ref 0–10)
Rheumatoid fact SerPl-aCnc: <10 IU/mL (ref 0.0–13.9)
Uric Acid: 4.1 mg/dL (ref 2.5–7.1)

## 2018-11-30 LAB — SEDIMENTATION RATE: Sed Rate: 22 mm/hr (ref 0–40)

## 2018-12-01 NOTE — Progress Notes (Signed)
Patient notified of results & recommendations. Expressed understanding. States that she was taking B12 supplements but stopped about 3-4 days ago. Will continue holding off on the supplements.

## 2018-12-12 ENCOUNTER — Telehealth: Payer: Self-pay | Admitting: Family Medicine

## 2018-12-12 NOTE — Telephone Encounter (Signed)
Advise patient I am placing a referral for her to see an orthopedic specialist for second opinion. Given that her blood work was completely normal, I have no explanation for her continued symptoms. Continue flexeril for pain and recommend starting over the counter Ibuprofen or Naproxen for pain, and cool ice compresses as tolerated for hand swelling.

## 2018-12-12 NOTE — Telephone Encounter (Signed)
Please advise.  Patient finished Prednisone on 12/08/18 & is taking the Flexeril PRN.

## 2018-12-12 NOTE — Telephone Encounter (Signed)
Patient notified of the following recommendations. Expressed understanding. Aware she will be contacted with appointment information to see Ortho.

## 2018-12-12 NOTE — Telephone Encounter (Signed)
Patient called to inform PCP that her hand is still swelling and still very painful, patient stated that she is taking medications, please follow up.

## 2018-12-12 NOTE — Addendum Note (Signed)
Addended by: Bing Neighbors on: 12/12/2018 11:05 AM   Modules accepted: Orders

## 2018-12-26 ENCOUNTER — Other Ambulatory Visit: Payer: Self-pay | Admitting: Family Medicine

## 2018-12-26 NOTE — Telephone Encounter (Signed)
Patient called asking for a refill on the predniSONE (DELTASONE) 20 MG tablet [[id number]] or she wants to come in to the office and get the shot she received the last visit.

## 2018-12-27 MED ORDER — DICLOFENAC SODIUM 1 % TD GEL: 2.0000 g | Freq: Four times a day (QID) | TRANSDERMAL | 0 refills | Status: DC

## 2018-12-27 MED ORDER — DICLOFENAC SODIUM 75 MG PO TBEC: 75.0000 mg | DELAYED_RELEASE_TABLET | Freq: Two times a day (BID) | ORAL | 0 refills | Status: DC

## 2018-12-27 NOTE — Telephone Encounter (Signed)
Patient notified of this information. Rxs released to [place] on file.

## 2018-12-27 NOTE — Telephone Encounter (Signed)
Please notify patient that I will send over Diclofenac 75 mg twice daily as needed for pain and Diclofenac gel which she can apply topically for pain. She is scheduled to follow-up with orthopedics tomorrow and they will take over management of care of her hand pain and hopefully identify the cause of hand pain.

## 2018-12-28 ENCOUNTER — Other Ambulatory Visit: Payer: Self-pay

## 2018-12-28 ENCOUNTER — Ambulatory Visit (INDEPENDENT_AMBULATORY_CARE_PROVIDER_SITE_OTHER): Payer: Self-pay | Admitting: Orthopaedic Surgery

## 2018-12-28 ENCOUNTER — Encounter (INDEPENDENT_AMBULATORY_CARE_PROVIDER_SITE_OTHER): Payer: Self-pay | Admitting: Orthopaedic Surgery

## 2018-12-28 VITALS — BP 172/99 | HR 85 | Ht 64.0 in | Wt 168.0 lb

## 2018-12-28 DIAGNOSIS — G5603 Carpal tunnel syndrome, bilateral upper limbs: Secondary | ICD-10-CM | POA: Insufficient documentation

## 2018-12-28 NOTE — Progress Notes (Signed)
Office Visit Note   Patient: Amy [name]           Date of Birth: 02/22/1966           MRN: [id number] Visit Date: 12/28/2018              Requested by: Bing NeighborsHarris, Kimberly S, FNP [address] [place], [place] [id number] PCP: Bing NeighborsHarris, Kimberly S, FNP   Assessment & Plan: Visit Diagnoses:  1. Carpal tunnel syndrome, bilateral     Plan: Symptoms are consistent with bilateral carpal tunnel syndrome.  She is slightly worse on the right dominant hand than the left.  We will try  volar wrist splints  and order EMGs and nerve conduction studies.  Patient is already had a Medrol Dosepak that made a difference.  Presently on diclofenac's which she will continue  Follow-Up Instructions: No follow-ups on file.   Orders:  No orders of the defined types were placed in this encounter.  No orders of the defined types were placed in this encounter.     Procedures: No procedures performed   Clinical Data: No additional findings.   Subjective: Chief Complaint  Patient presents with  . Right Hand - Pain  . Left Hand - Pain  Patient presents today for bilateral hand pain. Both hurt the same. They have been hurting for 6months ago and has been getting worse. She said that both feel like "needles and stinging" all over. She said that both hands are swollen. She said that the pain is worse at night. She saw her PCP and was given a dosepak last month. She said that it helped for about a month. She started taking Diclofenac tablets yesterday. She also states that she has pain in her proximal arms and her neck. She states that this happened 10years ago and was diagnosed with a Vit B12 deficiency. Once she got her B12 up, the symptoms went away.  No related neck pain.  Has had some pain referred from her hands as far proximally as the shoulders.  HPI  Review of Systems  Constitutional: Negative for fatigue.  HENT: Negative for ear pain.   Eyes: Positive for pain.  Respiratory: Negative  for shortness of breath.   Cardiovascular: Negative for leg swelling.  Gastrointestinal: Negative for constipation and diarrhea.  Endocrine: Positive for cold intolerance and heat intolerance.  Genitourinary: Negative for difficulty urinating.  Musculoskeletal: Positive for joint swelling.  Skin: Negative for rash.  Allergic/Immunologic: Negative for food allergies.  Neurological: Positive for weakness.  Hematological: Does not bruise/bleed easily.  Psychiatric/Behavioral: Positive for sleep disturbance.     Objective: Vital Signs: BP (!) 172/99   Pulse 85   Ht 5\' 4"  (1.626 m)   Wt 168 lb (76.2 kg)   LMP 01/26/2000   BMI 28.84 kg/m   Physical Exam Constitutional:      Appearance: She is well-developed.  Eyes:     Pupils: Pupils are equal, round, and reactive to light.  Pulmonary:     Effort: Pulmonary effort is normal.  Skin:    General: Skin is warm and dry.  Neurological:     Mental Status: She is alert and oriented to person, place, and time.  Psychiatric:        Behavior: Behavior normal.     Ortho Exam awake alert and oriented x3.  Comfortable sitting.  Positive Phalen's Tinel's both wrists. Peri Jefferson.  Good opposition of thumb to little fingers.  Sensation intact.  No pain with  range of motion of cervical spine  Specialty Comments:  No specialty comments available.  Imaging: No results found.   PMFS History: Patient Active Problem List   Diagnosis Date Noted  . Carpal tunnel syndrome, bilateral 12/28/2018  . Abnormal thyroid function test 03/03/2018  . Chronic obstructive pulmonary disease (HCC) 03/03/2018  . Right hip pain 03/03/2018  . Substance abuse (HCC) 03/03/2018  . Preop examination 02/03/2018  . Abnormal PFT 02/03/2018  . Hypothyroidism 02/03/2018  . Spondylolisthesis, lumbar region 01/18/2017  . Cocaine dependence (HCC) 07/25/2015  . 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/25/2015  . Hx of non anemic vitamin B12 deficiency 11/08/2014  . Brain  aneurysm 09/09/2014  . Smoker 07/22/2014  . Rhinitis, allergic 07/22/2014  . Chronic low back pain with sciatica 01/24/2013  . Health care maintenance 10/16/2012  . Hot flashes 02/08/2012  . Post-surgical hypothyroidism 12/07/2011  . Fibromyalgia muscle pain 03/31/2011  . Migraine 03/31/2011  . Essential hypertension, benign 11/14/2008  . DEPRESSION 08/10/2006   Past Medical History:  Diagnosis Date  . Abnormal PFT 01/2018  . Anxiety   . Arthritis   . Brain aneurysm   . Chronic back pain   . Depression    history of  . Grave's disease 2007   TSH (08/31/2010) = 0.186 (low), free T4 = 0.91 (WNL)  . History of substance abuse (HCC)    remote past  . Hypertension    cardiology consult 01/2018, Dr. Sharyn Lull  . Postsurgical hypothyroidism   . Raynaud disease 2007  . Smoker     Family History  Problem Relation Age of Onset  . Colon cancer Maternal Grandfather   . Cancer Maternal Grandfather        prostate  . Hypertension Mother   . Thyroid disease Mother   . Lupus Mother   . Hypertension Father     Past Surgical History:  Procedure Laterality Date  . ABDOMINAL HYSTERECTOMY     due to fibroids, still has ovaries  . COLONOSCOPY  02/14/2008   done by Dr. Christella Hartigan - small colonic polyp identified, 6 mm in size and per biopsy --> tubular adenoma with no malignancy or high grade dysplasia noted  . THYROIDECTOMY  03/08/2012   [facility]; history of Graves' disease with multinodular goiter  . TONSILLECTOMY    . WISDOM TOOTH EXTRACTION     Social History   Occupational History  . Not on file  Tobacco Use  . Smoking status: Current Every Day Smoker    Packs/day: 0.20    Years: 35.00    Pack years: 7.00    Types: Cigarettes  . Smokeless tobacco: Never Used  Substance and Sexual Activity  . Alcohol use: No    Alcohol/week: 0.0 standard drinks    Comment: sober for several years  . Drug use: Not Currently    Types: Cocaine, Marijuana    Comment: pt reports last use  40 days ago  . Sexual activity: Not on file

## 2018-12-28 NOTE — Addendum Note (Signed)
Addended by: Wendi Maya on: 12/28/2018 09:33 AM   Modules accepted: Orders

## 2019-01-08 ENCOUNTER — Emergency Department (HOSPITAL_COMMUNITY)
Admission: EM | Admit: 2019-01-08 | Discharge: 2019-01-08 | Disposition: A | Discharge status: Home / Self Care | Payer: No Typology Code available for payment source | Attending: Emergency Medicine | Admitting: Emergency Medicine

## 2019-01-08 ENCOUNTER — Encounter (HOSPITAL_COMMUNITY): Payer: Self-pay | Admitting: Emergency Medicine

## 2019-01-08 ENCOUNTER — Other Ambulatory Visit: Payer: Self-pay

## 2019-01-08 ENCOUNTER — Telehealth: Payer: Self-pay | Admitting: Family Medicine

## 2019-01-08 DIAGNOSIS — E890 Postprocedural hypothyroidism: Secondary | ICD-10-CM | POA: Insufficient documentation

## 2019-01-08 DIAGNOSIS — J449 Chronic obstructive pulmonary disease, unspecified: Secondary | ICD-10-CM | POA: Insufficient documentation

## 2019-01-08 DIAGNOSIS — I10 Essential (primary) hypertension: Secondary | ICD-10-CM | POA: Insufficient documentation

## 2019-01-08 DIAGNOSIS — R946 Abnormal results of thyroid function studies: Secondary | ICD-10-CM | POA: Insufficient documentation

## 2019-01-08 DIAGNOSIS — R7989 Other specified abnormal findings of blood chemistry: Secondary | ICD-10-CM

## 2019-01-08 DIAGNOSIS — R202 Paresthesia of skin: Secondary | ICD-10-CM | POA: Insufficient documentation

## 2019-01-08 DIAGNOSIS — F17210 Nicotine dependence, cigarettes, uncomplicated: Secondary | ICD-10-CM | POA: Insufficient documentation

## 2019-01-08 DIAGNOSIS — Z79899 Other long term (current) drug therapy: Secondary | ICD-10-CM | POA: Insufficient documentation

## 2019-01-08 LAB — CBC WITH DIFFERENTIAL/PLATELET
Abs Immature Granulocytes: 0.01 10*3/uL (ref 0.00–0.07)
Basophils Absolute: 0 10*3/uL (ref 0.0–0.1)
Basophils Relative: 0 %
Eosinophils Absolute: 0.1 10*3/uL (ref 0.0–0.5)
Eosinophils Relative: 3 %
HCT: 41.8 % (ref 36.0–46.0)
Hemoglobin: 13.4 g/dL (ref 12.0–15.0)
Immature Granulocytes: 0 %
Lymphocytes Relative: 38 %
Lymphs Abs: 2 10*3/uL (ref 0.7–4.0)
MCH: 30.4 pg (ref 26.0–34.0)
MCHC: 32.1 g/dL (ref 30.0–36.0)
MCV: 94.8 fL (ref 80.0–100.0)
Monocytes Absolute: 0.4 10*3/uL (ref 0.1–1.0)
Monocytes Relative: 7 %
Neutro Abs: 2.8 10*3/uL (ref 1.7–7.7)
Neutrophils Relative %: 52 %
Platelets: 191 10*3/uL (ref 150–400)
RBC: 4.41 MIL/uL (ref 3.87–5.11)
RDW: 11.9 % (ref 11.5–15.5)
WBC: 5.3 10*3/uL (ref 4.0–10.5)
nRBC: 0 % (ref 0.0–0.2)

## 2019-01-08 LAB — BASIC METABOLIC PANEL
Anion gap: 7 (ref 5–15)
BUN: 11 mg/dL (ref 6–20)
CO2: 28 mmol/L (ref 22–32)
Calcium: 9.4 mg/dL (ref 8.9–10.3)
Chloride: 106 mmol/L (ref 98–111)
Creatinine, Ser: 0.56 mg/dL (ref 0.44–1.00)
GFR calc Af Amer: >60 mL/min (ref >60)
GFR calc non Af Amer: >60 mL/min (ref >60)
Glucose, Bld: 108 mg/dL — ABNORMAL HIGH (ref 70–99)
Potassium: 3.5 mmol/L (ref 3.5–5.1)
Sodium: 141 mmol/L (ref 135–145)

## 2019-01-08 LAB — TSH: TSH: <0.01 u[IU]/mL — ABNORMAL LOW (ref 0.350–4.500)

## 2019-01-08 LAB — T4, FREE: Free T4: 1.05 ng/dL (ref 0.82–1.77)

## 2019-01-08 MED ORDER — OXYCODONE HCL 5 MG PO TABS
5.0000 mg | ORAL_TABLET | Freq: Once | ORAL | Status: AC
  Administered 2019-01-08: 5 mg via ORAL
  Filled 2019-01-08: qty 1

## 2019-01-08 MED ORDER — IBUPROFEN 800 MG PO TABS
800.0000 mg | ORAL_TABLET | Freq: Once | ORAL | Status: AC
  Administered 2019-01-08: 14:00:00 800 mg via ORAL
  Filled 2019-01-08: qty 1

## 2019-01-08 MED ORDER — ACETAMINOPHEN 500 MG PO TABS
1000.0000 mg | ORAL_TABLET | Freq: Once | ORAL | Status: AC
  Administered 2019-01-08: 1000 mg via ORAL
  Filled 2019-01-08: qty 2

## 2019-01-08 NOTE — Telephone Encounter (Signed)
Patient called and said she needs to see the kidney doctor because her creatinine was 400 and she has been felling numb all over

## 2019-01-08 NOTE — Telephone Encounter (Signed)
Notify patient that she will need to go to the ER for further evaluation of her symptoms of numbness all over. I recently checked her renal function and her level was completely normal. The only provider I see that she's seen since me was the orthopedic specialist. I would recommend follow-up at the ER for "numbness all over" as that is concerning symptom. Once seen at the ER, will evaluate based on labs if she warrants a referral to nephrology.

## 2019-01-08 NOTE — Telephone Encounter (Signed)
Patient advised to go to the ER per PCP's recommendation. Expressed understanding. States that she will go to [facility].

## 2019-01-08 NOTE — Discharge Instructions (Signed)
Follow up with your PCP. Please call them tomorrow and ask what to do with your thyroid medication.  I would have you hold your dose tomorrow until you talk with them.

## 2019-01-08 NOTE — ED Provider Notes (Signed)
[facility]-EMERGENCY DEPT Provider Note   CSN: [id number] Arrival date & time: 01/08/19  1150    History   Chief Complaint Chief Complaint  Patient presents with  . Numbness  . elevated CRT    [name] is a 53 y.o. female.     53 yo F with a chief complaint of diffuse body tingling.  This is been going on for about a month now.  She has seen her family doctor for this and feels it is getting worse.  She had lab work that was performed in the office that showed a high B12 level at that time she was taking significant amount of oral B12.  She has since stopped but feels that her symptoms gotten worse.  She also has aches that are underneath both arms seems occur with different positions.  Not exertional.  No shortness of breath with it.  No cough congestion or fever no vomiting or diarrhea denies any new medication changes.  She denies difficulty with speech or swallowing difficulty moving one side of the body or the other.  She feels that her legs are generally weak.  She was to have a L-spine surgery but it was called off because she is a recovering drug addict.  She had a recent drug screen that showed that her urine creatinine was high and so she was told to see her doctor.  She called her doctor about that test today and they suggested she come to the ED for evaluation.  The history is provided by the patient.  Illness  Severity:  Mild Onset quality:  Gradual Duration:  2 months Timing:  Constant Progression:  Worsening Chronicity:  New Associated symptoms: no chest pain, no congestion, no fever, no headaches, no myalgias, no nausea, no rhinorrhea, no shortness of breath, no vomiting and no wheezing     Past Medical History:  Diagnosis Date  . Abnormal PFT 01/2018  . Anxiety   . Arthritis   . Brain aneurysm   . Chronic back pain   . Depression    history of  . Grave's disease 2007   TSH (08/31/2010) = 0.186 (low), free T4 = 0.91 (WNL)  .  History of substance abuse (HCC)    remote past  . Hypertension    cardiology consult 01/2018, Dr. Sharyn Lull  . Postsurgical hypothyroidism   . Raynaud disease 2007  . Smoker     Patient Active Problem List   Diagnosis Date Noted  . Carpal tunnel syndrome, bilateral 12/28/2018  . Abnormal thyroid function test 03/03/2018  . Chronic obstructive pulmonary disease (HCC) 03/03/2018  . Right hip pain 03/03/2018  . Substance abuse (HCC) 03/03/2018  . Preop examination 02/03/2018  . Abnormal PFT 02/03/2018  . Hypothyroidism 02/03/2018  . Spondylolisthesis, lumbar region 01/18/2017  . Cocaine dependence (HCC) 07/25/2015  . 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/25/2015  . Hx of non anemic vitamin B12 deficiency 11/08/2014  . Brain aneurysm 09/09/2014  . Smoker 07/22/2014  . Rhinitis, allergic 07/22/2014  . Chronic low back pain with sciatica 01/24/2013  . Health care maintenance 10/16/2012  . Hot flashes 02/08/2012  . Post-surgical hypothyroidism 12/07/2011  . Fibromyalgia muscle pain 03/31/2011  . Migraine 03/31/2011  . Essential hypertension, benign 11/14/2008  . DEPRESSION 08/10/2006    Past Surgical History:  Procedure Laterality Date  . ABDOMINAL HYSTERECTOMY     due to fibroids, still has ovaries  . COLONOSCOPY  02/14/2008   done by Dr. Christella Hartigan -  small colonic polyp identified, 6 mm in size and per biopsy --> tubular adenoma with no malignancy or high grade dysplasia noted  . THYROIDECTOMY  03/08/2012   [facility]; history of Graves' disease with multinodular goiter  . TONSILLECTOMY    . WISDOM TOOTH EXTRACTION       OB History    Gravida  3   Para  3   Term  2   Preterm  1   AB  0   Living  2     SAB  0   TAB      Ectopic      Multiple      Live Births               Home Medications    Prior to Admission medications   Medication Sig Start Date End Date Taking? Authorizing Provider  acetaminophen (TYLENOL) 500 MG tablet Take 1,000 mg  by mouth every 6 (six) hours as needed for mild pain.   Yes [provider]  levothyroxine (SYNTHROID, LEVOTHROID) 125 MCG tablet Take 125 mcg by mouth daily. 11/06/18  Yes [provider]  losartan (COZAAR) 100 MG tablet Take 100 mg by mouth daily. 11/06/18  Yes [provider]  metoprolol succinate (TOPROL-XL) 50 MG 24 hr tablet Take 50 mg by mouth daily. 11/06/18  Yes [provider]  cetirizine (ZYRTEC) 10 MG tablet Take 1 tablet (10 mg total) by mouth daily. Patient not taking: Reported on 01/08/2019 11/29/18   Bing Neighbors, FNP  cyclobenzaprine (FLEXERIL) 10 MG tablet Take 1 tablet (10 mg total) by mouth 3 (three) times daily as needed (hand pain and numbness and tingling). Patient not taking: Reported on 01/08/2019 11/29/18   Bing Neighbors, FNP  diclofenac (VOLTAREN) 75 MG EC tablet Take 1 tablet (75 mg total) by mouth 2 (two) times daily. Patient not taking: Reported on 01/08/2019 12/27/18   Bing Neighbors, FNP  diclofenac sodium (VOLTAREN) 1 % GEL Apply 2 g topically 4 (four) times daily. Patient not taking: Reported on 01/08/2019 12/27/18   Bing Neighbors, FNP  fluticasone [facility]) 50 MCG/ACT nasal spray Place 2 sprays into both nostrils daily. Patient not taking: Reported on 01/08/2019 11/29/18   Bing Neighbors, FNP  predniSONE (DELTASONE) 20 MG tablet Take 3 PO QAM x3days, 2 PO QAM x3days, 1 PO QAM x3days Patient not taking: Reported on 01/08/2019 11/29/18   Bing Neighbors, FNP    Family History Family History  Problem Relation Age of Onset  . Colon cancer Maternal Grandfather   . Cancer Maternal Grandfather        prostate  . Hypertension Mother   . Thyroid disease Mother   . Lupus Mother   . Hypertension Father     Social History Social History   Tobacco Use  . Smoking status: Current Every Day Smoker    Packs/day: 0.20    Years: 35.00    Pack years: 7.00    Types: Cigarettes  . Smokeless tobacco: Never Used   Substance Use Topics  . Alcohol use: No    Alcohol/week: 0.0 standard drinks    Comment: sober for several years  . Drug use: Not Currently    Types: Cocaine, Marijuana    Comment: pt reports last use 40 days ago     Allergies   Adhesive [tape]; Morphine and related; and Vicodin [hydrocodone-acetaminophen]   Review of Systems Review of Systems  Constitutional: Negative for chills and fever.  HENT: Negative for congestion and rhinorrhea.   Eyes: Negative for redness and visual disturbance.  Respiratory: Negative for shortness of breath and wheezing.   Cardiovascular: Negative for chest pain and palpitations.  Gastrointestinal: Negative for nausea and vomiting.  Genitourinary: Negative for dysuria and urgency.  Musculoskeletal: Negative for arthralgias and myalgias.  Skin: Negative for pallor and wound.  Neurological: Positive for numbness. Negative for dizziness and headaches.     Physical Exam Updated Vital Signs BP (!) 143/86   Pulse 71   Temp 99 F (37.2 C) (Oral)   Resp 18   LMP 01/26/2000   SpO2 98%   Physical Exam Vitals signs and nursing note reviewed.  Constitutional:      General: She is not in acute distress.    Appearance: She is well-developed. She is not diaphoretic.  HENT:     Head: Normocephalic and atraumatic.  Eyes:     Pupils: Pupils are equal, round, and reactive to light.  Neck:     Musculoskeletal: Normal range of motion and neck supple.  Cardiovascular:     Rate and Rhythm: Normal rate and regular rhythm.     Heart sounds: No murmur. No friction rub. No gallop.   Pulmonary:     Effort: Pulmonary effort is normal.     Breath sounds: No wheezing or rales.  Abdominal:     General: There is no distension.     Palpations: Abdomen is soft.     Tenderness: There is no abdominal tenderness.  Musculoskeletal:        General: No tenderness.  Skin:    General: Skin is warm and dry.  Neurological:     Mental Status: She is alert and oriented  to person, place, and time.     Cranial Nerves: Cranial nerves are intact.     Sensory: Sensation is intact.     Motor: Motor function is intact.     Coordination: Coordination is intact.     Gait: Gait is intact.     Comments: Benign neuro exam  Psychiatric:        Behavior: Behavior normal.      ED Treatments / Results  Labs (all labs ordered are listed, but only abnormal results are displayed) Labs Reviewed  BASIC METABOLIC PANEL - Abnormal; Notable for the following components:      Result Value   Glucose, Bld 108 (*)    All other components within normal limits  TSH - Abnormal; Notable for the following components:   TSH <0.010 (*)    All other components within normal limits  CBC WITH DIFFERENTIAL/PLATELET  T4, FREE    EKG EKG Interpretation  Date/Time:  Monday January 08 2019 13:06:27 EDT Ventricular Rate:  76 PR Interval:    QRS Duration: 88 QT Interval:  365 QTC Calculation: 411 R Axis:   67 Text Interpretation:  Sinus rhythm Biatrial enlargement background noise TECHNICALLY DIFFICULT Otherwise no significant change Confirmed by Melene PlanFloyd, Ronaldo Crilly [phone]([id number]) on 01/08/2019 1:29:41 PM   Radiology No results found.  Procedures Procedures (including critical care time)  Medications Ordered in ED Medications  acetaminophen (TYLENOL) tablet 1,000 mg (has no administration in time range)  oxyCODONE (Oxy IR/ROXICODONE) immediate release tablet 5 mg (has no administration in time range)  ibuprofen (ADVIL) tablet 800 mg (has no administration in time range)     Initial Impression / Assessment and Plan / ED Course  I have reviewed the triage vital signs and the nursing notes.  Pertinent labs &  imaging results that were available during my care of the patient were reviewed by me and considered in my medical decision making (see chart for details).        53 yo F with a chief complaint of diffuse paresthesias from her head to her toes.  She recently was seen by her  doctor about a month ago and had outpatient labs performed which were remarkable for a significantly elevated B12 level.  Patient states that she has had some paresthesias in the past and was told her B12 was low and so she started taking high doses of this.  She has not taken it in the past week or so but is continued to have symptoms and thinks that is gotten worse.  Having some aches to the lateral aspects of her chest which are worse when she lays in a certain direction.  I will obtain lab work to evaluate for electrolyte abnormality, will obtain thyroid studies.  TSH low, most likely is hyperthyroid.  Recommended holding her dose of levothyroxine in the morning and calling her primary physician for further instructions.  Clinically not in thyroid storm.    1:58 PM:  I have discussed the diagnosis/risks/treatment options with the patient and believe the pt to be eligible for discharge home to follow-up with PCP. We also discussed returning to the ED immediately if new or worsening sx occur. We discussed the sx which are most concerning (e.g., sudden worsening pain, fever, inability to tolerate by mouth) that necessitate immediate return. Medications administered to the patient during their visit and any new prescriptions provided to the patient are listed below.  Medications given during this visit Medications  acetaminophen (TYLENOL) tablet 1,000 mg (has no administration in time range)  oxyCODONE (Oxy IR/ROXICODONE) immediate release tablet 5 mg (has no administration in time range)  ibuprofen (ADVIL) tablet 800 mg (has no administration in time range)     The patient appears reasonably screen and/or stabilized for discharge and I doubt any other medical condition or other [facility] requiring further screening, evaluation, or treatment in the ED at this time prior to discharge.    Final Clinical Impressions(s) / ED Diagnoses   Final diagnoses:  Paresthesia  Low TSH level    ED Discharge  Orders    None       Melene Plan, DO 01/08/19 1358

## 2019-01-08 NOTE — ED Triage Notes (Signed)
Pt c/o entire body numbness that's been ongoing for 6-7 months. Reports had testing done 2 weeks ago that showed elevated CRT and was recommended going to ED for further work up.

## 2019-01-09 ENCOUNTER — Ambulatory Visit: Payer: Self-pay | Admitting: Family Medicine

## 2019-01-10 ENCOUNTER — Telehealth: Payer: Self-pay | Admitting: Family Medicine

## 2019-01-10 ENCOUNTER — Other Ambulatory Visit: Payer: Self-pay | Admitting: Family Medicine

## 2019-01-10 DIAGNOSIS — R7989 Other specified abnormal findings of blood chemistry: Secondary | ICD-10-CM

## 2019-01-10 NOTE — Telephone Encounter (Signed)
Patient called to inform provider that when she went to the emergency department she was informed that her thyroid was elevated. Patient is requesting medications for this, please follow up.

## 2019-01-10 NOTE — Telephone Encounter (Signed)
Advise patient to discontinue Levothyroxine 125 mcg. Her thyroid function shows that her levels are over corrected. She needs to be scheduled for a lab appointment 2 weeks to repeat Thyroid study. She is not in need of medications at this time. If levels remain abnormal with follow-up labs, I will refer her to endocrinology.

## 2019-01-10 NOTE — Telephone Encounter (Signed)
Name and DOB verified. Patient aware of message per PCP. Appointment scheduled for thyroid study in 2 weeks. Pt verbalized understanding.

## 2019-01-17 ENCOUNTER — Telehealth: Payer: Self-pay | Admitting: Orthopaedic Surgery

## 2019-01-17 NOTE — Telephone Encounter (Signed)
Patient called stating she is scheduled for a nerve conduction study with Dr. Alvester Morin on 01/23/19.  Patient states her back pain has increased and is requesting a return call.

## 2019-01-18 NOTE — Telephone Encounter (Signed)
Please call.

## 2019-01-18 NOTE — Telephone Encounter (Signed)
I called her and sched her for 01/22/2019 @ 145pm.

## 2019-01-18 NOTE — Telephone Encounter (Signed)
Please see below.  Thank you.

## 2019-01-18 NOTE — Telephone Encounter (Signed)
Have her call Dr Barbaraann Faster office since she saw him for her back

## 2019-01-19 ENCOUNTER — Telehealth: Payer: Self-pay

## 2019-01-19 ENCOUNTER — Telehealth (INDEPENDENT_AMBULATORY_CARE_PROVIDER_SITE_OTHER): Payer: Self-pay | Admitting: Orthopaedic Surgery

## 2019-01-19 NOTE — Telephone Encounter (Signed)
Per patient, NCV is Tuesday, but hands are swollen, unable to hold anything, unable to use hands at all. Patient has been wearing brace, but getting worse. Patient having extreme pain, and does not know what to do. Patient very tearful, and asking for any relief she can get.Patient uses [facility] [employer].

## 2019-01-19 NOTE — Telephone Encounter (Signed)
I spoke with Triage at the [facility] office. They will call patient to see if she will come in this am for an appt.

## 2019-01-19 NOTE — Telephone Encounter (Signed)
Please schedule appt with someone at the [facility] office, today if possible, per [place]. Thank you.

## 2019-01-19 NOTE — Telephone Encounter (Signed)
Per [facility] (April B) wanted to see if patient could be seen Today. Per Elnita Maxwell she can wait until Monday to be seen. She has a sched appt already Monday with Dr Otelia Sergeant. I spoke to patient and she was okay coming in on her scheduled appt (01/22/2019). She states she sees Dr Alvester Morin on Tuesday for NCS/EMG. And would like to see Dr Cleophas Dunker on Thursday. Appt made for her. She is aware of all her appts. Advised her if anything worsens over the weekend she can always seek an urgent care.

## 2019-01-22 ENCOUNTER — Ambulatory Visit: Payer: Self-pay | Admitting: Specialist

## 2019-01-23 ENCOUNTER — Ambulatory Visit (INDEPENDENT_AMBULATORY_CARE_PROVIDER_SITE_OTHER): Payer: Self-pay | Admitting: Physical Medicine and Rehabilitation

## 2019-01-23 ENCOUNTER — Encounter: Payer: Self-pay | Admitting: Physical Medicine and Rehabilitation

## 2019-01-23 ENCOUNTER — Telehealth: Payer: Self-pay

## 2019-01-23 ENCOUNTER — Other Ambulatory Visit: Payer: Self-pay

## 2019-01-23 DIAGNOSIS — R202 Paresthesia of skin: Secondary | ICD-10-CM

## 2019-01-23 NOTE — Telephone Encounter (Signed)
Called patient to do their pre-visit COVID screening.  Have you recently traveled internationally([place], [place], [place], [place], [place]) or within the [place] to a hotspot area([place], [place], [place], [place], [place])? no  Are you currently experiencing any of the following: fever, cough, SHOB, fatigue? no  Have you been in contact with anyone who has recently travelled? no  Have you been in contact with anyone who is experiencing fever, cough, SHOB, fatigue or been diagnosed with COVID  or works in or has recently visited a SNF? no  

## 2019-01-23 NOTE — Progress Notes (Signed)
[name] - 53 y.o. female MRN [id number]  Date of birth: 1966/04/26  Office Visit Note: Visit Date: 01/23/2019 PCP: Bing Neighbors, FNP Referred by: Bing Neighbors, FNP  Subjective: Chief Complaint  Patient presents with  . Right Hand - Pain, Numbness  . Left Hand - Pain, Numbness   HPI: Amy [name] is a 53 y.o. female who comes in today At the request of Dr. [name] for electrodiagnostic studies of both upper limbs.  I have actually seen the patient in the past for lumbar spine injection and she does have a history of lumbar spine issues injections given to surgery at some point but does have a problematic history of substance abuse and cocaine use.  She comes in today as a right-handed dominant patient with 1 year of constant right hand pain and numbness with pain radiating to the shoulder with numbness and tingling.  She gets symptoms on the left side as well but can come and go a little bit more on the left.  She reports a longer history of intermittent problems.  She reports she has difficulty sleeping that she is in tears.  Her pain is 10 out of 10.  She feels like she cannot really hold objects anymore and cannot work.  She has not had prior electrodiagnostic studies.  She has not had cervical spine imaging.  ROS Otherwise per HPI.  Assessment & Plan: Visit Diagnoses:  1. Paresthesia of skin     Plan: Impression: The above electrodiagnostic study is ABNORMAL and reveals evidence of a moderate BILATERAL median nerve entrapment at the wrist (carpal tunnel syndrome) affecting sensory and motor components.   There is no significant electrodiagnostic evidence of any other focal nerve entrapment, brachial plexopathy or cervical radiculopathy.   Recommendations: 1.  Follow-up with referring physician. 2.  Continue current management of symptoms. 3.  Suggest surgical evaluation.    Meds & Orders: No orders of the defined types were placed in this encounter.    Orders Placed This Encounter  Procedures  . NCV with EMG (electromyography)    Follow-up: Return for [name], MD.   Procedures: No procedures performed  EMG & NCV Findings: Evaluation of the left median motor and the right median motor nerves showed prolonged distal onset latency (L4.7, R5.2 ms) and decreased conduction velocity (Elbow-Wrist, L49, R47 m/s).  The left median (across palm) sensory and the right median (across palm) sensory nerves showed prolonged distal peak latency (Wrist, L4.9, R4.9 ms).  All remaining nerves (as indicated in the following tables) were within normal limits.  All left vs. right side differences were within normal limits.    All examined muscles (as indicated in the following table) showed no evidence of electrical instability.    Impression: The above electrodiagnostic study is ABNORMAL and reveals evidence of a moderate BILATERAL median nerve entrapment at the wrist (carpal tunnel syndrome) affecting sensory and motor components.   There is no significant electrodiagnostic evidence of any other focal nerve entrapment, brachial plexopathy or cervical radiculopathy.   Recommendations: 1.  Follow-up with referring physician. 2.  Continue current management of symptoms. 3.  Suggest surgical evaluation.  ___________________________ Amy [name] FAAPMR Board Certified, American Board of Physical Medicine and Rehabilitation    Nerve Conduction Studies Anti Sensory Summary Table   Stim Site NR Peak (ms) Norm Peak (ms) P-T Amp (V) Norm P-T Amp Site1 Site2 Delta-P (ms) Dist (cm) Vel (m/s) Norm Vel (m/s)  Left Median Acr Amy [name]  Anti Sensory (2nd Digit)  32.2C  Wrist    *4.9 <3.6 18.2 >10 Wrist Palm 3.3 0.0    Palm    1.6 <2.0 11.8         Right Median Acr Palm Anti Sensory (2nd Digit)  32.8C  Wrist    *4.9 <3.6 17.1 >10 Wrist Palm 3.4 0.0    Palm    1.5 <2.0 6.7         Left Radial Anti Sensory (Base 1st Digit)  32.1C  Wrist    1.8 <3.1 29.2   Wrist Base 1st Digit 1.8 0.0    Right Radial Anti Sensory (Base 1st Digit)  33C  Wrist    1.9 <3.1 40.7  Wrist Base 1st Digit 1.9 0.0    Left Ulnar Anti Sensory (5th Digit)  32.5C  Wrist    3.0 <3.7 32.3 >15.0 Wrist 5th Digit 3.0 14.0 47 >38  Right Ulnar Anti Sensory (5th Digit)  33.2C  Wrist    2.9 <3.7 34.0 >15.0 Wrist 5th Digit 2.9 14.0 48 >38   Motor Summary Table   Stim Site NR Onset (ms) Norm Onset (ms) O-P Amp (mV) Norm O-P Amp Site1 Site2 Delta-0 (ms) Dist (cm) Vel (m/s) Norm Vel (m/s)  Left Median Motor (Abd Poll Brev)  32.2C  Wrist    *4.7 <4.2 7.4 >5 Elbow Wrist 4.1 20.0 *49 >50  Elbow    8.8  7.2         Right Median Motor (Abd Poll Brev)  32.7C  Wrist    *5.2 <4.2 5.9 >5 Elbow Wrist 4.3 20.0 *47 >50  Elbow    9.5  6.7         Left Ulnar Motor (Abd Dig Min)  32.5C  Wrist    2.9 <4.2 10.8 >3 B Elbow Wrist 3.0 18.0 60 >53  B Elbow    5.9  10.7  A Elbow B Elbow 1.1 10.0 91 >53  A Elbow    7.0  10.4         Right Ulnar Motor (Abd Dig Min)  32.5C  Wrist    2.7 <4.2 10.9 >3 B Elbow Wrist 3.2 18.0 56 >53  B Elbow    5.9  10.3  A Elbow B Elbow 1.1 9.5 86 >53  A Elbow    7.0  10.1          EMG   Side Muscle Nerve Root Ins Act Fibs Psw Amp Dur Poly Recrt Int Dennie BiblePat Comment  Right Abd Poll Brev Median C8-T1 Nml Nml Nml Nml Nml 0 Nml Nml   Right 1stDorInt Ulnar C8-T1 Nml Nml Nml Nml Nml 0 Nml Nml   Right PronatorTeres Median C6-7 Nml Nml Nml Nml Nml 0 Nml Nml   Right Biceps Musculocut C5-6 Nml Nml Nml Nml Nml 0 Nml Nml   Right Deltoid Axillary C5-6 Nml Nml Nml Nml Nml 0 Nml Nml     Nerve Conduction Studies Anti Sensory Left/Right Comparison   Stim Site L Lat (ms) R Lat (ms) L-R Lat (ms) L Amp (V) R Amp (V) L-R Amp (%) Site1 Site2 L Vel (m/s) R Vel (m/s) L-R Vel (m/s)  Median Acr Palm Anti Sensory (2nd Digit)  32.2C  Wrist *4.9 *4.9 0.0 18.2 17.1 6.0 Wrist Palm     Palm 1.6 1.5 0.1 11.8 6.7 43.2       Radial Anti Sensory (Base 1st Digit)  32.1C  Wrist 1.8 1.9 0.1  29.2 40.7 28.3 Wrist  Base 1st Digit     Ulnar Anti Sensory (5th Digit)  32.5C  Wrist 3.0 2.9 0.1 32.3 34.0 5.0 Wrist 5th Digit 47 48 1   Motor Left/Right Comparison   Stim Site L Lat (ms) R Lat (ms) L-R Lat (ms) L Amp (mV) R Amp (mV) L-R Amp (%) Site1 Site2 L Vel (m/s) R Vel (m/s) L-R Vel (m/s)  Median Motor (Abd Poll Brev)  32.2C  Wrist *4.7 *5.2 0.5 7.4 5.9 20.3 Elbow Wrist *49 *47 2  Elbow 8.8 9.5 0.7 7.2 6.7 6.9       Ulnar Motor (Abd Dig Min)  32.5C  Wrist 2.9 2.7 0.2 10.8 10.9 0.9 B Elbow Wrist 60 56 4  B Elbow 5.9 5.9 0.0 10.7 10.3 3.7 A Elbow B Elbow 91 86 5  A Elbow 7.0 7.0 0.0 10.4 10.1 2.9          Waveforms:                     Clinical History: Lumbar spine MRI dated 02/21/2017  FINDINGS: Segmentation: Normal segmentation. Lowest well-formed disc is labeled the L5-S1 level.  Alignment: Trace 3 mm anterolisthesis of L4 on L5. Mild levoscoliosis. Vertebral bodies otherwise normally aligned with preservation of the normal lumbar lordosis.  Vertebrae: Vertebral body heights are well maintained. No evidence for acute or chronic fracture. Mild reactive edema within the bilateral pedicles of L4 on L5 bilaterally due to facet degeneration. Signal intensity within the vertebral body bone marrow otherwise normal. No discrete or worrisome osseous lesion.  Conus medullaris: Extends to the T12 level and appears normal.  Paraspinal and other soft tissues: Paraspinous soft tissues within normal limits. Probable atheromatous disease noted within the intra-abdominal aorta without aneurysm. Visualized visceral structures grossly unremarkable.  Disc levels:  T12-L1: Seen only on sagittal projection. Mild disc bulge with disc desiccation. No stenosis.  L1-2:  Unremarkable.  L2-3: Normal interspace. Mild left-sided facet arthrosis. No significant canal or neural foraminal stenosis.  L3-4: Normal interspace. Mild bilateral facet arthrosis. No  significant canal or neural foraminal stenosis.  L4-5: 3 mm anterolisthesis of L4 on L5. Diffuse degenerative disc bulge, slightly eccentric to the left. Advanced bilateral facet arthrosis with prominent reactive effusions within the bilateral L4-5 facets. Associated reactive marrow edema within the bilateral pedicles of L4 and L5 due to facet degeneration. Resultant moderate bilateral lateral recess narrowing, worse on the left, potentially affecting either of the descending L5 nerve roots. Moderate bilateral L4 foraminal stenosis due to disc bulge and facet disease, slightly worse on the right.  L5-S1: Shallow posterior disc bulge. No associated stenosis or evidence for neural impingement. Mild right-sided facet degeneration. Epidural lipomatosis. No significant canal or foraminal stenosis.  IMPRESSION: 1. 3 mm anterolisthesis of L4 on L5 with associated disc bulge and advanced facet arthrosis, resulting in moderate lateral recess and foraminal stenosis bilaterally. Either of the L4 or L5 nerve roots could be affected bilaterally. 2. Shallow posterior disc bulge at L5-S1 without stenosis or neural impingement. 3. Multilevel facet arthrosis extending from L2-3 through L5-S1 as above. Changes most notable at the L4-5 level were there is associated reactive marrow edema. Findings could serve as a source for lower back pain.   She reports that she has been smoking cigarettes. She has a 7.00 pack-year smoking history. She has never used smokeless tobacco.  Recent Labs    11/29/18 1503  LABURIC 4.1    Objective:  VS:  HT:    WT:  BMI:     BP:   HR: bpm  TEMP: ( )  RESP:  Physical Exam Musculoskeletal:        General: Tenderness present. No swelling or deformity.     Comments: Inspection reveals mild CMC osteoarthritis and some flattening of the bilateral APB but no atrophy of the bilateral APB or FDI or hand intrinsics. There is no swelling, color changes, allodynia or  dystrophic changes. There is 5 out of 5 strength in the bilateral wrist extension, finger abduction and long finger flexion.  There is impaired sensation to light touch in the right median nerve distribution.  There is a negative Hoffmann's test bilaterally.  Skin:    General: Skin is warm and dry.     Findings: No erythema or rash.  Neurological:     General: No focal deficit present.     Mental Status: She is alert and oriented to person, place, and time.     Motor: No weakness or abnormal muscle tone.     Coordination: Coordination normal.  Psychiatric:        Mood and Affect: Mood normal.        Behavior: Behavior normal.     Ortho Exam Imaging: No results found.  Past Medical/Family/Surgical/Social History: Medications & Allergies reviewed per EMR, new medications updated. Patient Active Problem List   Diagnosis Date Noted  . Carpal tunnel syndrome, bilateral 12/28/2018  . Abnormal thyroid function test 03/03/2018  . Chronic obstructive pulmonary disease (HCC) 03/03/2018  . Right hip pain 03/03/2018  . Substance abuse (HCC) 03/03/2018  . Preop examination 02/03/2018  . Abnormal PFT 02/03/2018  . Hypothyroidism 02/03/2018  . Spondylolisthesis, lumbar region 01/18/2017  . Cocaine dependence (HCC) 07/25/2015  . 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/25/2015  . Hx of non anemic vitamin B12 deficiency 11/08/2014  . Brain aneurysm 09/09/2014  . Smoker 07/22/2014  . Rhinitis, allergic 07/22/2014  . Chronic low back pain with sciatica 01/24/2013  . Health care maintenance 10/16/2012  . Hot flashes 02/08/2012  . Post-surgical hypothyroidism 12/07/2011  . Fibromyalgia muscle pain 03/31/2011  . Migraine 03/31/2011  . Essential hypertension, benign 11/14/2008  . DEPRESSION 08/10/2006   Past Medical History:  Diagnosis Date  . Abnormal PFT 01/2018  . Anxiety   . Arthritis   . Brain aneurysm   . Chronic back pain   . Depression    history of  . Grave's disease 2007    TSH (08/31/2010) = 0.186 (low), free T4 = 0.91 (WNL)  . History of substance abuse (HCC)    remote past  . Hypertension    cardiology consult 01/2018, Dr. Sharyn Lull  . Postsurgical hypothyroidism   . Raynaud disease 2007  . Smoker    Family History  Problem Relation Age of Onset  . Colon cancer Maternal Grandfather   . Cancer Maternal Grandfather        prostate  . Hypertension Mother   . Thyroid disease Mother   . Lupus Mother   . Hypertension Father    Past Surgical History:  Procedure Laterality Date  . ABDOMINAL HYSTERECTOMY     due to fibroids, still has ovaries  . COLONOSCOPY  02/14/2008   done by Dr. Christella Hartigan - small colonic polyp identified, 6 mm in size and per biopsy --> tubular adenoma with no malignancy or high grade dysplasia noted  . THYROIDECTOMY  03/08/2012   [facility]; history of Graves' disease with multinodular goiter  . TONSILLECTOMY    . WISDOM TOOTH EXTRACTION  Social History   Occupational History  . Not on file  Tobacco Use  . Smoking status: Current Every Day Smoker    Packs/day: 0.20    Years: 35.00    Pack years: 7.00    Types: Cigarettes  . Smokeless tobacco: Never Used  Substance and Sexual Activity  . Alcohol use: No    Alcohol/week: 0.0 standard drinks    Comment: sober for several years  . Drug use: Not Currently    Types: Cocaine, Marijuana    Comment: pt reports last use 40 days ago  . Sexual activity: Not on file

## 2019-01-23 NOTE — Progress Notes (Signed)
 .  Numeric Pain Rating Scale and Functional Assessment Average Pain 10   In the last MONTH (on 0-10 scale) has pain interfered with the following?  1. General activity like being  able to carry out your everyday physical activities such as walking, climbing stairs, carrying groceries, or moving a chair?  Rating(9)     

## 2019-01-24 ENCOUNTER — Other Ambulatory Visit: Payer: Self-pay

## 2019-01-24 DIAGNOSIS — R7989 Other specified abnormal findings of blood chemistry: Secondary | ICD-10-CM

## 2019-01-24 NOTE — Procedures (Signed)
EMG & NCV Findings: Evaluation of the left median motor and the right median motor nerves showed prolonged distal onset latency (L4.7, R5.2 ms) and decreased conduction velocity (Elbow-Wrist, L49, R47 m/s).  The left median (across palm) sensory and the right median (across palm) sensory nerves showed prolonged distal peak latency (Wrist, L4.9, R4.9 ms).  All remaining nerves (as indicated in the following tables) were within normal limits.  All left vs. right side differences were within normal limits.    All examined muscles (as indicated in the following table) showed no evidence of electrical instability.    Impression: The above electrodiagnostic study is ABNORMAL and reveals evidence of a moderate BILATERAL median nerve entrapment at the wrist (carpal tunnel syndrome) affecting sensory and motor components.   There is no significant electrodiagnostic evidence of any other focal nerve entrapment, brachial plexopathy or cervical radiculopathy.   Recommendations: 1.  Follow-up with referring physician. 2.  Continue current management of symptoms. 3.  Suggest surgical evaluation.  ___________________________ Naaman PlummerFred Jaxson Anglin FAAPMR Board Certified, American Board of Physical Medicine and Rehabilitation    Nerve Conduction Studies Anti Sensory Summary Table   Stim Site NR Peak (ms) Norm Peak (ms) P-T Amp (V) Norm P-T Amp Site1 Site2 Delta-P (ms) Dist (cm) Vel (m/s) Norm Vel (m/s)  Left Median Acr Palm Anti Sensory (2nd Digit)  32.2C  Wrist    *4.9 <3.6 18.2 >10 Wrist Palm 3.3 0.0    Palm    1.6 <2.0 11.8         Right Median Acr Palm Anti Sensory (2nd Digit)  32.8C  Wrist    *4.9 <3.6 17.1 >10 Wrist Palm 3.4 0.0    Palm    1.5 <2.0 6.7         Left Radial Anti Sensory (Base 1st Digit)  32.1C  Wrist    1.8 <3.1 29.2  Wrist Base 1st Digit 1.8 0.0    Right Radial Anti Sensory (Base 1st Digit)  33C  Wrist    1.9 <3.1 40.7  Wrist Base 1st Digit 1.9 0.0    Left Ulnar Anti Sensory (5th  Digit)  32.5C  Wrist    3.0 <3.7 32.3 >15.0 Wrist 5th Digit 3.0 14.0 47 >38  Right Ulnar Anti Sensory (5th Digit)  33.2C  Wrist    2.9 <3.7 34.0 >15.0 Wrist 5th Digit 2.9 14.0 48 >38   Motor Summary Table   Stim Site NR Onset (ms) Norm Onset (ms) O-P Amp (mV) Norm O-P Amp Site1 Site2 Delta-0 (ms) Dist (cm) Vel (m/s) Norm Vel (m/s)  Left Median Motor (Abd Poll Brev)  32.2C  Wrist    *4.7 <4.2 7.4 >5 Elbow Wrist 4.1 20.0 *49 >50  Elbow    8.8  7.2         Right Median Motor (Abd Poll Brev)  32.7C  Wrist    *5.2 <4.2 5.9 >5 Elbow Wrist 4.3 20.0 *47 >50  Elbow    9.5  6.7         Left Ulnar Motor (Abd Dig Min)  32.5C  Wrist    2.9 <4.2 10.8 >3 B Elbow Wrist 3.0 18.0 60 >53  B Elbow    5.9  10.7  A Elbow B Elbow 1.1 10.0 91 >53  A Elbow    7.0  10.4         Right Ulnar Motor (Abd Dig Min)  32.5C  Wrist    2.7 <4.2 10.9 >3 B Elbow Wrist 3.2 18.0  56 >53  B Elbow    5.9  10.3  A Elbow B Elbow 1.1 9.5 86 >53  A Elbow    7.0  10.1          EMG   Side Muscle Nerve Root Ins Act Fibs Psw Amp Dur Poly Recrt Int Dennie Bible Comment  Right Abd Poll Brev Median C8-T1 Nml Nml Nml Nml Nml 0 Nml Nml   Right 1stDorInt Ulnar C8-T1 Nml Nml Nml Nml Nml 0 Nml Nml   Right PronatorTeres Median C6-7 Nml Nml Nml Nml Nml 0 Nml Nml   Right Biceps Musculocut C5-6 Nml Nml Nml Nml Nml 0 Nml Nml   Right Deltoid Axillary C5-6 Nml Nml Nml Nml Nml 0 Nml Nml     Nerve Conduction Studies Anti Sensory Left/Right Comparison   Stim Site L Lat (ms) R Lat (ms) L-R Lat (ms) L Amp (V) R Amp (V) L-R Amp (%) Site1 Site2 L Vel (m/s) R Vel (m/s) L-R Vel (m/s)  Median Acr Palm Anti Sensory (2nd Digit)  32.2C  Wrist *4.9 *4.9 0.0 18.2 17.1 6.0 Wrist Palm     Palm 1.6 1.5 0.1 11.8 6.7 43.2       Radial Anti Sensory (Base 1st Digit)  32.1C  Wrist 1.8 1.9 0.1 29.2 40.7 28.3 Wrist Base 1st Digit     Ulnar Anti Sensory (5th Digit)  32.5C  Wrist 3.0 2.9 0.1 32.3 34.0 5.0 Wrist 5th Digit 47 48 1   Motor Left/Right Comparison    Stim Site L Lat (ms) R Lat (ms) L-R Lat (ms) L Amp (mV) R Amp (mV) L-R Amp (%) Site1 Site2 L Vel (m/s) R Vel (m/s) L-R Vel (m/s)  Median Motor (Abd Poll Brev)  32.2C  Wrist *4.7 *5.2 0.5 7.4 5.9 20.3 Elbow Wrist *49 *47 2  Elbow 8.8 9.5 0.7 7.2 6.7 6.9       Ulnar Motor (Abd Dig Min)  32.5C  Wrist 2.9 2.7 0.2 10.8 10.9 0.9 B Elbow Wrist 60 56 4  B Elbow 5.9 5.9 0.0 10.7 10.3 3.7 A Elbow B Elbow 91 86 5  A Elbow 7.0 7.0 0.0 10.4 10.1 2.9          Waveforms:

## 2019-01-24 NOTE — Telephone Encounter (Signed)
While in office for her lab appointment patient asked for a refill request to be sent for the Amitriptyline 100 mg at bedtime.  Please advise.

## 2019-01-24 NOTE — Telephone Encounter (Signed)
I've never prescribed amitriptyline to the patient previously.  In review of medications,  I do not see that patient has been on this medication since  2016. If she was recently getting this prescribed by a mental health professional, she will need to follow-up with that provider. If she hasn't taken the medication since 2016, she will need to schedule a virtual visit as I have not evaluated her for condition in which treatment with amitriptyline is recommended.

## 2019-01-24 NOTE — Telephone Encounter (Signed)
Can you schedule patient a televisit?

## 2019-01-24 NOTE — Telephone Encounter (Signed)
Patient states that she was being prescribed this by her previous PCP for her headaches.

## 2019-01-25 ENCOUNTER — Encounter: Payer: Self-pay | Admitting: Orthopaedic Surgery

## 2019-01-25 ENCOUNTER — Other Ambulatory Visit: Payer: Self-pay

## 2019-01-25 ENCOUNTER — Ambulatory Visit: Payer: Self-pay | Admitting: Orthopaedic Surgery

## 2019-01-25 VITALS — BP 166/88 | HR 73 | Ht 64.0 in | Wt 175.0 lb

## 2019-01-25 DIAGNOSIS — G5603 Carpal tunnel syndrome, bilateral upper limbs: Secondary | ICD-10-CM

## 2019-01-25 LAB — THYROID PANEL WITH TSH
Free Thyroxine Index: 0.7 — ABNORMAL LOW (ref 1.2–4.9)
T3 Uptake Ratio: 18 % — ABNORMAL LOW (ref 24–39)
T4, Total: 4.1 ug/dL — ABNORMAL LOW (ref 4.5–12.0)
TSH: 1.56 u[IU]/mL (ref 0.450–4.500)

## 2019-01-25 NOTE — Progress Notes (Signed)
Office Visit Note   Patient: Amy [name]           Date of Birth: 05/28/1966           MRN: [id number] Visit Date: 01/25/2019              Requested by: Bing NeighborsHarris, Kimberly S, FNP [address] [place], [place] [id number] PCP: Bing NeighborsHarris, Kimberly S, FNP   Assessment & Plan: Visit Diagnoses:  1. Bilateral carpal tunnel syndrome     Plan: EMGs and nerve conduction studies revealed bilateral moderate median nerve trapping at the wrist.  Symptoms are consistent with the studies.  Long discussion with Amy [name] regarding her diagnosis.  She like to proceed with carpal tunnel release.  I discussed this in detail including the incision potential for infection and incomplete pain relief and even reflex sympathetic dystrophy.  He is reached a point where she is miserable and having trouble despite wearing the splints and anti-inflammatory medicines.  She is a recovering addict and might have some trouble with pain medicines postoperatively.  She also was of [place] orange card.  We will try and get it scheduled as quickly as we can  Follow-Up Instructions: No follow-ups on file.   Orders:  No orders of the defined types were placed in this encounter.  No orders of the defined types were placed in this encounter.     Procedures: No procedures performed   Clinical Data: No additional findings.   Subjective: Chief Complaint  Patient presents with  . Right Hand - Follow-up  . Left Hand - Follow-up  Patient presents today for follow up on carpal tunnel in both hands. She had an EMG study on 01/23/19. She is not taking anything for pain. Patient states that her pain has increased in the last month since her last visit.  Studies performed by Dr. Alvester MorinNewton demonstrate moderate bilateral median nerve entrapment at the wrists she is more symptomatic on the right Having considerable difficulty sleeping.  Presently being evaluated for her thyroid having had a prior thyroidectomy the  possibility of "too much thyroid medicine".  Also is a recovering addict but has been clean for over 5 months  HPI  Review of Systems  Constitutional: Positive for fatigue.  HENT: Negative for ear pain.   Eyes: Negative for pain.  Respiratory: Negative for shortness of breath.   Cardiovascular: Negative for leg swelling.  Gastrointestinal: Negative for constipation and diarrhea.  Endocrine: Positive for cold intolerance and heat intolerance.  Genitourinary: Negative for difficulty urinating.  Musculoskeletal: Negative for joint swelling.  Skin: Negative for rash.  Allergic/Immunologic: Negative for food allergies.  Neurological: Negative for weakness.  Hematological: Does not bruise/bleed easily.  Psychiatric/Behavioral: Positive for sleep disturbance.     Objective: Vital Signs: BP (!) 166/88   Pulse 73   Ht 5\' 4"  (1.626 m)   Wt 175 lb (79.4 kg)   LMP 01/26/2000   BMI 30.04 kg/m   Physical Exam Constitutional:      Appearance: She is well-developed.  Eyes:     Pupils: Pupils are equal, round, and reactive to light.  Pulmonary:     Effort: Pulmonary effort is normal.  Skin:    General: Skin is warm and dry.  Neurological:     Mental Status: She is alert and oriented to person, place, and time.  Psychiatric:        Behavior: Behavior normal.     Ortho Exam positive Phalen's both hands and  Tinel's over the median nerve.  Neurologically intact bilaterally.  No swelling.  No pain at the base of either thumb.  Able to oppose thumb to little finger bilaterally. Specialty Comments:  No specialty comments available.  Imaging: No results found.   PMFS History: Patient Active Problem List   Diagnosis Date Noted  . Bilateral carpal tunnel syndrome 12/28/2018  . Abnormal thyroid function test 03/03/2018  . Chronic obstructive pulmonary disease (HCC) 03/03/2018  . Right hip pain 03/03/2018  . Substance abuse (HCC) 03/03/2018  . Preop examination 02/03/2018  .  Abnormal PFT 02/03/2018  . Hypothyroidism 02/03/2018  . Spondylolisthesis, lumbar region 01/18/2017  . Cocaine dependence (HCC) 07/25/2015  . 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/25/2015  . Hx of non anemic vitamin B12 deficiency 11/08/2014  . Brain aneurysm 09/09/2014  . Smoker 07/22/2014  . Rhinitis, allergic 07/22/2014  . Chronic low back pain with sciatica 01/24/2013  . Health care maintenance 10/16/2012  . Hot flashes 02/08/2012  . Post-surgical hypothyroidism 12/07/2011  . Fibromyalgia muscle pain 03/31/2011  . Migraine 03/31/2011  . Essential hypertension, benign 11/14/2008  . DEPRESSION 08/10/2006   Past Medical History:  Diagnosis Date  . Abnormal PFT 01/2018  . Anxiety   . Arthritis   . Brain aneurysm   . Chronic back pain   . Depression    history of  . Grave's disease 2007   TSH (08/31/2010) = 0.186 (low), free T4 = 0.91 (WNL)  . History of substance abuse (HCC)    remote past  . Hypertension    cardiology consult 01/2018, Dr. Sharyn Lull  . Postsurgical hypothyroidism   . Raynaud disease 2007  . Smoker     Family History  Problem Relation Age of Onset  . Colon cancer Maternal Grandfather   . Cancer Maternal Grandfather        prostate  . Hypertension Mother   . Thyroid disease Mother   . Lupus Mother   . Hypertension Father     Past Surgical History:  Procedure Laterality Date  . ABDOMINAL HYSTERECTOMY     due to fibroids, still has ovaries  . COLONOSCOPY  02/14/2008   done by Dr. Christella Hartigan - small colonic polyp identified, 6 mm in size and per biopsy --> tubular adenoma with no malignancy or high grade dysplasia noted  . THYROIDECTOMY  03/08/2012   [facility]; history of Graves' disease with multinodular goiter  . TONSILLECTOMY    . WISDOM TOOTH EXTRACTION     Social History   Occupational History  . Not on file  Tobacco Use  . Smoking status: Current Every Day Smoker    Packs/day: 0.20    Years: 35.00    Pack years: 7.00    Types:  Cigarettes  . Smokeless tobacco: Never Used  Substance and Sexual Activity  . Alcohol use: No    Alcohol/week: 0.0 standard drinks    Comment: sober for several years  . Drug use: Not Currently    Types: Cocaine, Marijuana    Comment: pt reports last use 40 days ago  . Sexual activity: Not on file

## 2019-01-29 ENCOUNTER — Ambulatory Visit (INDEPENDENT_AMBULATORY_CARE_PROVIDER_SITE_OTHER): Payer: Self-pay | Admitting: Family Medicine

## 2019-01-29 ENCOUNTER — Other Ambulatory Visit: Payer: Self-pay

## 2019-01-29 ENCOUNTER — Encounter: Payer: Self-pay | Admitting: Family Medicine

## 2019-01-29 DIAGNOSIS — R7989 Other specified abnormal findings of blood chemistry: Secondary | ICD-10-CM

## 2019-01-29 DIAGNOSIS — G629 Polyneuropathy, unspecified: Secondary | ICD-10-CM

## 2019-01-29 DIAGNOSIS — G5603 Carpal tunnel syndrome, bilateral upper limbs: Secondary | ICD-10-CM

## 2019-01-29 MED ORDER — AMITRIPTYLINE HCL 100 MG PO TABS: ORAL_TABLET | ORAL | 3 refills | Status: DC

## 2019-01-29 NOTE — Progress Notes (Signed)
Virtual Visit via Telephone Note  I connected with Amy [name] on 01/29/19 at  2:10 PM EDT by telephone and verified that I am speaking with the correct person using two identifiers.  Location: Patient: Located at home during today's encounter  Provider: Located at primary care office   I discussed the limitations, risks, security and privacy concerns of performing an evaluation and management service by telephone and the availability of in person appointments. I also discussed with the patient that there may be a patient responsible charge related to this service. The patient expressed understanding and agreed to proceed.  History of Present Illness: Amy [name] has DEPRESSION; Essential hypertension, benign; Fibromyalgia muscle pain; Migraine; Post-surgical hypothyroidism; Hot flashes; Health care maintenance; Chronic low back pain with sciatica; Smoker; Rhinitis, allergic; Brain aneurysm; Hx of non anemic vitamin B12 deficiency; Cocaine dependence (HCC); Cocaine-induced mood disorder (HCC); Spondylolisthesis, lumbar region; Preop examination; Abnormal PFT; Hypothyroidism; Abnormal thyroid function test; Chronic obstructive pulmonary disease (HCC); Right hip pain; Substance abuse (HCC); and Bilateral carpal tunnel syndrome on their problem list.   Amy [name] is requesting to resume a Amtriplytine which was previously prescribed for neuropathic pain and insomnia back in 2016. Patient became dependent on substances and had no further follow-up with provider. She is currently experiencing severe bilateral hand pain secondary to carpal tunnel. She is followed by Dr. Cleophas Dunker for management of carpal tunnel. She is a recovering addict therefore is deferring from use of any controlled medications. Complains of constant numbness, tingling, and burning pain involving both hands. Currently taking cyclobenzaprine and Voltaren 75 mg only for pain.  Amy [name] has history of thyroid ablation several years ago. She is  currently taking levothyroxine 125 mg which she was prescribed prior to establishing care here. Recent thyroid study was abnormal indicating hyperthyroidism likely secondary to dose of thyroid medication. She is currently not taking her thyroid medication as instructed. TSH as 5 days 1.560, improved from <0.010.  Denies palpitations, unintentional weight loss, or temperature sensitivity.   Assessment and Plan: 1. Bilateral carpal tunnel syndrome 2. Neuropathy -Resume amitriptyline 100 mg once daily at bedtime. -Continue follow-up with Dr. Cleophas Dunker 3. Decreased thyroid stimulating hormone  -Hold Levothyroxine x 4 weeks and return for repeat TSH .  Follow Up Instructions: Return 4 weeks for repeat TSH  Return in 6 weeks for hypertension follow-up   I discussed the assessment and treatment plan with the patient. The patient was provided an opportunity to ask questions and all were answered. The patient agreed with the plan and demonstrated an understanding of the instructions.   The patient was advised to call back or seek an in-person evaluation if the symptoms worsen or if the condition fails to improve as anticipated.  I provided 20 minutes of non-face-to-face time during this encounter.   Joaquin Courts, FNP

## 2019-01-29 NOTE — Progress Notes (Deleted)
Called patient to initiate their telephone visit with provider Joaquin Courts, FNP-C. Verified date of birth. Would like to discuss restarting her Amitriptyline for headaches.Marland Kitchen KWalker, CMA.

## 2019-02-19 ENCOUNTER — Other Ambulatory Visit: Payer: Self-pay

## 2019-02-19 DIAGNOSIS — R7989 Other specified abnormal findings of blood chemistry: Secondary | ICD-10-CM

## 2019-02-19 NOTE — Progress Notes (Signed)
Patient walked in to make lab appointment. Appointment added same day.

## 2019-02-20 ENCOUNTER — Other Ambulatory Visit: Payer: Self-pay | Admitting: Family Medicine

## 2019-02-20 LAB — THYROID PANEL WITH TSH
Free Thyroxine Index: 0.3 — ABNORMAL LOW (ref 1.2–4.9)
T3 Uptake Ratio: 14 % — ABNORMAL LOW (ref 24–39)
T4, Total: 1.9 ug/dL — CL (ref 4.5–12.0)
TSH: 45.04 u[IU]/mL — ABNORMAL HIGH (ref 0.450–4.500)

## 2019-02-20 MED ORDER — LEVOTHYROXINE SODIUM 100 MCG PO TABS: 100.0000 ug | ORAL_TABLET | Freq: Every day | ORAL | 3 refills | Status: DC

## 2019-02-20 NOTE — Telephone Encounter (Signed)
Patient notified of lab results & recommendations. Expressed understanding. Is agreeable to restarting Levothyroxine. Prescription sent to [facility] on file. Moved appointment from 03/09/2019 to 03/06/2019 since office is closed on Fridays at the moment.

## 2019-02-20 NOTE — Telephone Encounter (Signed)
Please contact patient to advise her thyroid function is abnormal and is now very underactive. Restarting Levothyroxine at 100 mcg. Will repeat TSH in two weeks. Please schedule lab visit. She is not to have level check prior to 2 weeks as this will give me an indication if I need to increase of decrease medication dose.

## 2019-02-26 ENCOUNTER — Other Ambulatory Visit: Payer: Self-pay

## 2019-03-05 ENCOUNTER — Telehealth: Payer: Self-pay

## 2019-03-05 NOTE — Telephone Encounter (Signed)
Called patient to do their pre-visit COVID screening.  Call went to voicemail. Unable to do prescreening.  

## 2019-03-06 ENCOUNTER — Ambulatory Visit: Payer: Self-pay | Admitting: Family Medicine

## 2019-03-09 ENCOUNTER — Ambulatory Visit: Payer: Self-pay | Admitting: Family Medicine

## 2019-04-12 ENCOUNTER — Other Ambulatory Visit: Payer: Self-pay

## 2019-04-12 ENCOUNTER — Encounter (HOSPITAL_BASED_OUTPATIENT_CLINIC_OR_DEPARTMENT_OTHER): Payer: Self-pay

## 2019-04-12 NOTE — H&P (Signed)
Amy [name] is an 53 y.o. female.   Chief Complaint: Numbness right hand  HPI:  Patient has been seen for paresthesias and dysesthesias of the right hand.  EMGs were obtained on Jan 23, 2019 and at that time she was noted to have moderate bilateral median nerve entrapment at the wrists with the right being more symptomatic.  She is having much difficulty with sleeping.  Plan for right carpal tunnel release   Past Medical History:  Diagnosis Date  . Abnormal PFT 01/2018  . Anxiety   . Arthritis   . Brain aneurysm    2-3 mm, stable  . Chronic back pain   . Depression    history of  . Grave's disease 2007   TSH (08/31/2010) = 0.186 (low), free T4 = 0.91 (WNL)  . History of substance abuse ([place])    remote past  . Hypertension    cardiology consult 01/2018, Dr. Terrence Dupont  . Postsurgical hypothyroidism   . Raynaud disease 2007  . Smoker     Past Surgical History:  Procedure Laterality Date  . ABDOMINAL HYSTERECTOMY     due to fibroids, still has ovaries  . COLONOSCOPY  02/14/2008   done by Dr. Ardis Hughs - small colonic polyp identified, 6 mm in size and per biopsy --> tubular adenoma with no malignancy or high grade dysplasia noted  . THYROIDECTOMY  03/08/2012   [facility]; history of Graves' disease with multinodular goiter  . TONSILLECTOMY    . WISDOM TOOTH EXTRACTION      Family History  Problem Relation Age of Onset  . Colon cancer Maternal Grandfather   . Cancer Maternal Grandfather        prostate  . Hypertension Mother   . Thyroid disease Mother   . Lupus Mother   . Hypertension Father    Social History:  reports that she has been smoking cigarettes. She has a 17.50 pack-year smoking history. She has never used smokeless tobacco. She reports previous drug use. Drugs: Cocaine and Marijuana. She reports that she does not drink alcohol.  Allergies:  Allergies  Allergen Reactions  . Adhesive [Tape] Other (See Comments)    Pulls skin off  . Morphine And Related  Nausea And Vomiting and Other (See Comments)    Does not like feeling  . Vicodin [Hydrocodone-Acetaminophen] Itching   Review of Systems  Constitutional: Positive for fatigue.  HENT: Negative for ear pain.   Eyes: Negative for pain.  Respiratory: Negative for shortness of breath.   Cardiovascular: Negative for leg swelling.  Gastrointestinal: Negative for constipation and diarrhea.  Endocrine: Positive for cold intolerance and heat intolerance.  Genitourinary: Negative for difficulty urinating.  Musculoskeletal: Negative for joint swelling.  Skin: Negative for rash.  Allergic/Immunologic: Negative for food allergies.  Neurological: Negative for weakness.  Hematological: Does not bruise/bleed easily.  Psychiatric/Behavioral: Positive for sleep disturbance.    No current facility-administered medications for this encounter.    Current Outpatient Medications  Medication Sig Dispense Refill  . amitriptyline (ELAVIL) 100 MG tablet Take 1/2 tablet for 7 days, then increase to 100 mg daily. 30 tablet 3  . levothyroxine (SYNTHROID) 100 MCG tablet Take 1 tablet (100 mcg total) by mouth daily. 90 tablet 3  . losartan (COZAAR) 100 MG tablet Take 100 mg by mouth daily.    . metoprolol succinate (TOPROL-XL) 50 MG 24 hr tablet Take 50 mg by mouth daily.        No results found for this or any previous  visit (from the past 48 hour(s)). No results found.   Height 5\' 4"  (1.626 m), weight 77.1 kg, last menstrual period 01/26/2000.   Physical Exam  Constitutional: She is oriented to person, place, and time. She appears well-developed and well-nourished.  HENT:  Head: Normocephalic and atraumatic.  Eyes: Pupils are equal, round, and reactive to light. Conjunctivae and EOM are normal.  Neck: Neck supple.  Cardiovascular: Normal rate, regular rhythm, normal heart sounds and intact distal pulses.  Respiratory: Effort normal and breath sounds normal.  GI: Soft. Bowel sounds are normal.   Neurological: She is alert and oriented to person, place, and time.  Skin: Skin is warm and dry.  Psychiatric: She has a normal mood and affect. Her behavior is normal. Judgment and thought content normal.     Assessment/Plan  Clinical impression: Right carpal tunnel syndrome  Recommendation: Right carpal tunnel release with median nerve neurolysis   Jacqualine CodeBrian Petrarca, PA-C 04/12/2019, 3:38 PM

## 2019-04-16 ENCOUNTER — Other Ambulatory Visit (HOSPITAL_COMMUNITY)
Admission: RE | Admit: 2019-04-16 | Discharge: 2019-04-16 | Disposition: A | Discharge status: Home / Self Care | Payer: HRSA Program | Source: Ambulatory Visit | Attending: Orthopaedic Surgery | Admitting: Orthopaedic Surgery

## 2019-04-16 DIAGNOSIS — Z20828 Contact with and (suspected) exposure to other viral communicable diseases: Secondary | ICD-10-CM | POA: Insufficient documentation

## 2019-04-16 LAB — SARS CORONAVIRUS 2 (TAT 6-24 HRS): SARS Coronavirus 2: NEGATIVE

## 2019-04-18 NOTE — Anesthesia Preprocedure Evaluation (Addendum)
Anesthesia Evaluation  Patient identified by MRN, date of birth, ID band Patient awake    Reviewed: Allergy & Precautions, NPO status , Patient's Chart, lab work & pertinent test results, reviewed documented beta blocker date and time   History of Anesthesia Complications Negative for: history of anesthetic complications  Airway Mallampati: II  TM Distance: >3 FB Neck ROM: Full    Dental no notable dental hx. (+)    Pulmonary COPD, Current Smoker,    Pulmonary exam normal        Cardiovascular hypertension, Pt. on medications and Pt. on home beta blockers Normal cardiovascular exam     Neuro/Psych  Headaches, Anxiety Depression    GI/Hepatic negative GI ROS, Remote hx of cocaine abuse (none since 09/2018)   Endo/Other  Hypothyroidism   Renal/GU negative Renal ROS     Musculoskeletal  (+) Arthritis , Fibromyalgia -  Abdominal   Peds  Hematology negative hematology ROS (+)   Anesthesia Other Findings Day of surgery medications reviewed with the patient.  Reproductive/Obstetrics                           Anesthesia Physical Anesthesia Plan  ASA: II  Anesthesia Plan: Bier Block and Bier Block-LIDOCAINE ONLY   Post-op Pain Management:    Induction:   PONV Risk Score and Plan: 1 and Treatment may vary due to age or medical condition and Propofol infusion  Airway Management Planned: Natural Airway and Simple Face Mask  Additional Equipment:   Intra-op Plan:   Post-operative Plan: Extubation in OR  Informed Consent: I have reviewed the patients History and Physical, chart, labs and discussed the procedure including the risks, benefits and alternatives for the proposed anesthesia with the patient or authorized representative who has indicated his/her understanding and acceptance.     Dental advisory given  Plan Discussed with: CRNA  Anesthesia Plan Comments:         Anesthesia Quick Evaluation

## 2019-04-19 ENCOUNTER — Ambulatory Visit (HOSPITAL_BASED_OUTPATIENT_CLINIC_OR_DEPARTMENT_OTHER)
Admission: RE | Admit: 2019-04-19 | Discharge: 2019-04-19 | Disposition: A | Discharge status: Home / Self Care | Payer: Self-pay | Attending: Orthopaedic Surgery | Admitting: Orthopaedic Surgery

## 2019-04-19 ENCOUNTER — Ambulatory Visit (HOSPITAL_BASED_OUTPATIENT_CLINIC_OR_DEPARTMENT_OTHER): Payer: Self-pay | Admitting: Anesthesiology

## 2019-04-19 ENCOUNTER — Encounter (HOSPITAL_BASED_OUTPATIENT_CLINIC_OR_DEPARTMENT_OTHER)
Admission: RE | Disposition: A | Discharge status: Home / Self Care | Payer: Self-pay | Source: Home / Self Care | Attending: Orthopaedic Surgery

## 2019-04-19 ENCOUNTER — Encounter (HOSPITAL_BASED_OUTPATIENT_CLINIC_OR_DEPARTMENT_OTHER): Payer: Self-pay | Admitting: *Deleted

## 2019-04-19 DIAGNOSIS — I7300 Raynaud's syndrome without gangrene: Secondary | ICD-10-CM | POA: Insufficient documentation

## 2019-04-19 DIAGNOSIS — G8929 Other chronic pain: Secondary | ICD-10-CM | POA: Insufficient documentation

## 2019-04-19 DIAGNOSIS — G5601 Carpal tunnel syndrome, right upper limb: Secondary | ICD-10-CM

## 2019-04-19 DIAGNOSIS — Z79899 Other long term (current) drug therapy: Secondary | ICD-10-CM | POA: Insufficient documentation

## 2019-04-19 DIAGNOSIS — F329 Major depressive disorder, single episode, unspecified: Secondary | ICD-10-CM | POA: Insufficient documentation

## 2019-04-19 DIAGNOSIS — F17210 Nicotine dependence, cigarettes, uncomplicated: Secondary | ICD-10-CM | POA: Insufficient documentation

## 2019-04-19 DIAGNOSIS — Z885 Allergy status to narcotic agent status: Secondary | ICD-10-CM | POA: Insufficient documentation

## 2019-04-19 DIAGNOSIS — I10 Essential (primary) hypertension: Secondary | ICD-10-CM | POA: Insufficient documentation

## 2019-04-19 DIAGNOSIS — E890 Postprocedural hypothyroidism: Secondary | ICD-10-CM | POA: Insufficient documentation

## 2019-04-19 DIAGNOSIS — M1990 Unspecified osteoarthritis, unspecified site: Secondary | ICD-10-CM | POA: Insufficient documentation

## 2019-04-19 DIAGNOSIS — G5603 Carpal tunnel syndrome, bilateral upper limbs: Secondary | ICD-10-CM | POA: Insufficient documentation

## 2019-04-19 DIAGNOSIS — Z888 Allergy status to other drugs, medicaments and biological substances status: Secondary | ICD-10-CM | POA: Insufficient documentation

## 2019-04-19 DIAGNOSIS — Z79890 Hormone replacement therapy (postmenopausal): Secondary | ICD-10-CM | POA: Insufficient documentation

## 2019-04-19 HISTORY — PX: CARPAL TUNNEL RELEASE: SHX101

## 2019-04-19 SURGERY — CARPAL TUNNEL RELEASE
Anesthesia: Regional | Site: Wrist | Laterality: Right

## 2019-04-19 MED ORDER — LACTATED RINGERS IV SOLN
INTRAVENOUS | Status: DC
  Administered 2019-04-19: 12:00:00 via INTRAVENOUS

## 2019-04-19 MED ORDER — MIDAZOLAM HCL 2 MG/2ML IJ SOLN
INTRAMUSCULAR | Status: AC
  Filled 2019-04-19: qty 2

## 2019-04-19 MED ORDER — PROPOFOL 500 MG/50ML IV EMUL
INTRAVENOUS | Status: DC | PRN
  Administered 2019-04-19: 50 ug/kg/min via INTRAVENOUS

## 2019-04-19 MED ORDER — MIDAZOLAM HCL 5 MG/5ML IJ SOLN
INTRAMUSCULAR | Status: DC | PRN
  Administered 2019-04-19 (×2): 1 mg via INTRAVENOUS

## 2019-04-19 MED ORDER — PROPOFOL 10 MG/ML IV BOLUS
INTRAVENOUS | Status: AC
  Filled 2019-04-19: qty 20

## 2019-04-19 MED ORDER — ACETAMINOPHEN 10 MG/ML IV SOLN: 1000.0000 mg | Freq: Once | INTRAVENOUS | Status: DC | PRN

## 2019-04-19 MED ORDER — FENTANYL CITRATE (PF) 100 MCG/2ML IJ SOLN
INTRAMUSCULAR | Status: DC | PRN
  Administered 2019-04-19 (×2): 50 ug via INTRAVENOUS

## 2019-04-19 MED ORDER — BUPIVACAINE HCL (PF) 0.25 % IJ SOLN
INTRAMUSCULAR | Status: DC | PRN
  Administered 2019-04-19: 6 mL

## 2019-04-19 MED ORDER — CHLORHEXIDINE GLUCONATE 4 % EX LIQD: 60.0000 mL | Freq: Once | CUTANEOUS | Status: DC

## 2019-04-19 MED ORDER — ONDANSETRON HCL 4 MG/2ML IJ SOLN
INTRAMUSCULAR | Status: AC
  Filled 2019-04-19: qty 2

## 2019-04-19 MED ORDER — CEFAZOLIN SODIUM-DEXTROSE 2-3 GM-%(50ML) IV SOLR
INTRAVENOUS | Status: DC | PRN
  Administered 2019-04-19: 2 g via INTRAVENOUS

## 2019-04-19 MED ORDER — FENTANYL CITRATE (PF) 100 MCG/2ML IJ SOLN
INTRAMUSCULAR | Status: AC
  Filled 2019-04-19: qty 2

## 2019-04-19 MED ORDER — PROMETHAZINE HCL 25 MG/ML IJ SOLN: 6.2500 mg | INTRAMUSCULAR | Status: DC | PRN

## 2019-04-19 MED ORDER — PROPOFOL 10 MG/ML IV BOLUS
INTRAVENOUS | Status: DC | PRN
  Administered 2019-04-19 (×2): 10 mg via INTRAVENOUS

## 2019-04-19 MED ORDER — OXYCODONE HCL 5 MG PO TABS: 5.0000 mg | ORAL_TABLET | ORAL | 0 refills | Status: DC | PRN

## 2019-04-19 MED ORDER — FENTANYL CITRATE (PF) 100 MCG/2ML IJ SOLN: 25.0000 ug | INTRAMUSCULAR | Status: DC | PRN

## 2019-04-19 MED ORDER — SODIUM CHLORIDE 0.9 % IV SOLN: INTRAVENOUS | Status: DC

## 2019-04-19 MED ORDER — ONDANSETRON HCL 4 MG/2ML IJ SOLN
INTRAMUSCULAR | Status: DC | PRN
  Administered 2019-04-19: 4 mg via INTRAVENOUS

## 2019-04-19 MED ORDER — LIDOCAINE HCL (PF) 0.5 % IJ SOLN
INTRAMUSCULAR | Status: DC | PRN
  Administered 2019-04-19: 30 mL via INTRAVENOUS

## 2019-04-19 MED ORDER — KETOROLAC TROMETHAMINE 30 MG/ML IJ SOLN
INTRAMUSCULAR | Status: DC | PRN
  Administered 2019-04-19: 30 mg via INTRAVENOUS

## 2019-04-19 MED ORDER — CEFAZOLIN SODIUM-DEXTROSE 2-4 GM/100ML-% IV SOLN
INTRAVENOUS | Status: AC
  Filled 2019-04-19: qty 100

## 2019-04-19 SURGICAL SUPPLY — 52 items
BLADE SURG 15 STRL LF DISP TIS (BLADE) ×1 IMPLANT
BLADE SURG 15 STRL SS (BLADE) ×8
BNDG CMPR 9X4 STRL LF SNTH (GAUZE/BANDAGES/DRESSINGS) ×1
BNDG COHESIVE 2X5 TAN STRL LF (GAUZE/BANDAGES/DRESSINGS) IMPLANT
BNDG ELASTIC 3X5.8 VLCR STR LF (GAUZE/BANDAGES/DRESSINGS) IMPLANT
BNDG ESMARK 4X9 LF (GAUZE/BANDAGES/DRESSINGS) ×2 IMPLANT
BNDG GAUZE ELAST 4 BULKY (GAUZE/BANDAGES/DRESSINGS) ×1 IMPLANT
CANISTER SUCT 1200ML W/VALVE (MISCELLANEOUS) IMPLANT
COVER BACK TABLE REUSABLE LG (DRAPES) ×2 IMPLANT
COVER MAYO STAND REUSABLE (DRAPES) ×2 IMPLANT
COVER WAND RF STERILE (DRAPES) IMPLANT
DRAPE EXTREMITY T 121X128X90 (DISPOSABLE) ×2 IMPLANT
DRAPE SURG 17X23 STRL (DRAPES) ×2 IMPLANT
DRSG EMULSION OIL 3X3 NADH (GAUZE/BANDAGES/DRESSINGS) ×1 IMPLANT
DRSG PAD ABDOMINAL 8X10 ST (GAUZE/BANDAGES/DRESSINGS) ×2 IMPLANT
DURAPREP 26ML APPLICATOR (WOUND CARE) ×2 IMPLANT
ELECT NDL TIP 2.8 STRL (NEEDLE) ×1 IMPLANT
ELECT NEEDLE TIP 2.8 STRL (NEEDLE) ×2 IMPLANT
ELECT REM PT RETURN 9FT ADLT (ELECTROSURGICAL) ×2
ELECTRODE REM PT RTRN 9FT ADLT (ELECTROSURGICAL) ×1 IMPLANT
GAUZE SPONGE 4X4 12PLY STRL (GAUZE/BANDAGES/DRESSINGS) ×2 IMPLANT
GLOVE BIO SURGEON STRL SZ8.5 (GLOVE) ×1 IMPLANT
GLOVE BIOGEL PI IND STRL 7.0 (GLOVE) IMPLANT
GLOVE BIOGEL PI IND STRL 8 (GLOVE) ×1 IMPLANT
GLOVE BIOGEL PI INDICATOR 7.0 (GLOVE) ×1
GLOVE BIOGEL PI INDICATOR 8 (GLOVE) ×1
GLOVE ECLIPSE 8.0 STRL XLNG CF (GLOVE) ×2 IMPLANT
GOWN STRL REUS W/ TWL LRG LVL3 (GOWN DISPOSABLE) ×1 IMPLANT
GOWN STRL REUS W/TWL LRG LVL3 (GOWN DISPOSABLE) ×4
NDL HYPO 25X1 1.5 SAFETY (NEEDLE) IMPLANT
NEEDLE HYPO 25X1 1.5 SAFETY (NEEDLE) IMPLANT
NS IRRIG 1000ML POUR BTL (IV SOLUTION) ×2 IMPLANT
PACK BASIN DAY SURGERY FS (CUSTOM PROCEDURE TRAY) ×2 IMPLANT
PAD CAST 3X4 CTTN HI CHSV (CAST SUPPLIES) ×1 IMPLANT
PADDING CAST ABS 3INX4YD NS (CAST SUPPLIES) ×1
PADDING CAST ABS 4INX4YD NS (CAST SUPPLIES) ×1
PADDING CAST ABS COTTON 3X4 (CAST SUPPLIES) ×1 IMPLANT
PADDING CAST ABS COTTON 4X4 ST (CAST SUPPLIES) ×1 IMPLANT
PADDING CAST COTTON 3X4 STRL (CAST SUPPLIES) ×2
PENCIL BUTTON HOLSTER BLD 10FT (ELECTRODE) ×2 IMPLANT
SPLINT PLASTER CAST XFAST 3X15 (CAST SUPPLIES) IMPLANT
SPLINT PLASTER XTRA FASTSET 3X (CAST SUPPLIES)
SPLINT UNIVERSAL RIGHT (SOFTGOODS) ×1 IMPLANT
STOCKINETTE 4X48 STRL (DRAPES) ×2 IMPLANT
SUCTION FRAZIER HANDLE 10FR (MISCELLANEOUS)
SUCTION TUBE FRAZIER 10FR DISP (MISCELLANEOUS) IMPLANT
SUT ETHILON 4 0 PS 2 18 (SUTURE) ×2 IMPLANT
SYR BULB 3OZ (MISCELLANEOUS) ×2 IMPLANT
SYR CONTROL 10ML LL (SYRINGE) IMPLANT
TOWEL GREEN STERILE FF (TOWEL DISPOSABLE) ×2 IMPLANT
TUBE CONNECTING 20X1/4 (TUBING) IMPLANT
UNDERPAD 30X30 (UNDERPADS AND DIAPERS) ×2 IMPLANT

## 2019-04-19 NOTE — Op Note (Signed)
NAME: Amy [name], Amy [name] MEDICAL RECORD [id number] ACCOUNT [id number] DATE OF BIRTH:01/11/1966 FACILITY: [facility] LOCATION: [place], [place]  OPERATIVE REPORT  DATE OF PROCEDURE:  04/19/2019  PREOPERATIVE DIAGNOSIS:  Right carpal tunnel syndrome.  POSTOPERATIVE DIAGNOSIS:  Right carpal tunnel syndrome.  PROCEDURE:  Release of volar carpal ligament, right wrist and decompression median nerve.  SURGEON:  Joni Fears, MD  ASSISTANT:  Biagio Borg, PA-C.  ANESTHESIA:  Bier block with IV sedation.  COMPLICATIONS:  None.  HISTORY:  [name] has had prolonged problems with numbness and tingling in both of her hands associated with dropping objects.  She has tried splinting and anti-inflammatory medicines without much relief.  She has recently had EMGs and nerve  conduction studies demonstrating moderate to severe bilateral carpal tunnel syndrome.  She is more symptomatic on the right and wishes to proceed with surgical decompression.  DESCRIPTION OF PROCEDURE:  [name] and identified the right wrist as appropriate operative site and marked it accordingly.  IV access was obtained in the dorsum of the wrist by anesthesia.  The patient was then transported to room 3.  She was carefully placed on the operating table under IV sedation.  A Bier block with a forearm tourniquet was obtained by anesthesia.  Right upper extremity was then prepped with DuraPrep and the tips of the fingers to the tourniquet.  Sterile draping was performed.  Timeout was called.  A longitudinal incision was then outlined along the longitudinal wrist crease extending from the transverse wrist crease to about an inch and a quarter distally.  Via sharp dissection, the incision was carried down to subcutaneous tissue.  Small bleeders  were Bovie coagulated.  Self-retaining retractor was inserted.  The fibers of the palmaris longus were identified  and carefully incised and allowed to retract ulnarly and radially.  There was a very well developed palmaris brevis and this was released from its ulnar attachment.  The volar carpal ligament was identified beneath.  There was an aberrant takeoff of the motor recurrent branch that originated from the ulnar aspect of the  nerve.  This was carefully protected.  Starting at the distal extent of the volar carpal ligament, I carefully incised the ligament along its ulnar border.  It was quite tight.  A retractor was then placed beneath the wrist and the superficial fascia.  I  was able under direct visualization to complete release of the volar carpal ligament, thus decompressing the median nerve.  The nerve was pale and after several minutes it pinked up.    I carefully retracted the nerve radially and evaluate the flexor tendons.  I did not see any evidence of a tenosynovitis.  At that point, the wound was irrigated with saline solution.  Skin was closed with interrupted 4-0 Ethilon.  A sterile bulky dressing was applied followed by a posterior splint and Ace bandage.  PLAN:  Oxycodone for pain.  Office in 1 week.  TN/NUANCE  D:04/19/2019 T:04/19/2019 JOB:[id number]

## 2019-04-19 NOTE — Anesthesia Procedure Notes (Signed)
Anesthesia Regional Block: Bier block (IV Regional)   Pre-Anesthetic Checklist: ,, timeout performed, Correct Patient, Correct Site, Correct Laterality, Correct Procedure,, site marked, surgical consent,, at surgeon's request  Laterality: Right     Needles:  Injection technique: Single-shot  Needle Type: Other      Needle Gauge: 22     Additional Needles:   Procedures:,,,,, intact distal pulses, Esmarch exsanguination, single tourniquet utilized,  Narrative:  Start time: 04/19/2019 12:09 PM End time: 04/19/2019 12:09 PM  Performed by: Personally

## 2019-04-19 NOTE — Transfer of Care (Signed)
Immediate Anesthesia Transfer of Care Note  Patient: Amy [name]  Procedure(s) Performed: RIGHT CARPAL TUNNEL RELEASE (Right Wrist)  Patient Location: PACU  Anesthesia Type:MAC and Bier block  Level of Consciousness: awake, alert  and oriented  Airway & Oxygen Therapy: Patient Spontanous Breathing and Patient connected to nasal cannula oxygen  Post-op Assessment: Report given to RN and Post -op Vital signs reviewed and stable  Post vital signs: Reviewed and stable  Last Vitals:  Vitals Value Taken Time  BP    Temp    Pulse 85 04/19/19 1300  Resp    SpO2 100 % 04/19/19 1300  Vitals shown include unvalidated device data.  Last Pain:  Vitals:   04/19/19 0950  PainSc: 6          Complications: No apparent anesthesia complications

## 2019-04-19 NOTE — Anesthesia Postprocedure Evaluation (Signed)
Anesthesia Post Note  Patient: Amy [name]  Procedure(s) Performed: RIGHT CARPAL TUNNEL RELEASE (Right Wrist)     Patient location during evaluation: PACU Anesthesia Type: Bier Block Level of consciousness: awake and alert and oriented Pain management: pain level controlled Vital Signs Assessment: post-procedure vital signs reviewed and stable Respiratory status: spontaneous breathing, nonlabored ventilation and respiratory function stable Cardiovascular status: blood pressure returned to baseline Postop Assessment: no apparent nausea or vomiting Anesthetic complications: no    Last Vitals:  Vitals:   04/19/19 1320 04/19/19 1330  BP:  (!) 149/87  Pulse: 80 85  Resp: 16 (!) 21  Temp:    SpO2: 99% 96%    Last Pain:  Vitals:   04/19/19 1330  PainSc: 0-No pain                 Brennan Bailey

## 2019-04-19 NOTE — H&P (Signed)
The recent History & Physical has been reviewed. I have personally examined the patient today. There is no interval change to the documented History & Physical. The patient would like to proceed with the procedure.  Garald Balding 04/19/2019,  11:56 AM

## 2019-04-19 NOTE — Discharge Instructions (Signed)

## 2019-04-19 NOTE — Op Note (Signed)
PATIENT ID:      [name]  MRN:     [id number] DOB/AGE:    23-Nov-1965 / 53 y.o.       OPERATIVE REPORT    DATE OF PROCEDURE:  04/19/2019       PREOPERATIVE DIAGNOSIS:   right carpal tunnel syndrome                                                       Estimated body mass index is 29.93 kg/m as calculated from the following:   Height as of this encounter: 5\' 4"  (1.626 m).   Weight as of this encounter: 79.1 kg.     POSTOPERATIVE DIAGNOSIS:   right carpal tunnel syndrome                                                                     Estimated body mass index is 29.93 kg/m as calculated from the following:   Height as of this encounter: 5\' 4"  (1.626 m).   Weight as of this encounter: 79.1 kg.     PROCEDURE:  Procedure(s): RIGHT CARPAL TUNNEL RELEASE      SURGEON:  Joni Fears, MD    ASSISTANT:   Biagio Borg, PA-C   (Present and scrubbed throughout the case, critical for assistance with exposure, retraction, instrumentation, and closure.)          ANESTHESIA: regional and IV sedation     DRAINS: none :         COMPLICATIONS:  None   CONDITION:  stable  PROCEDURE IN DETAIL:  [place] 04/19/2019, 12:38 PM

## 2019-04-20 ENCOUNTER — Encounter (HOSPITAL_BASED_OUTPATIENT_CLINIC_OR_DEPARTMENT_OTHER): Payer: Self-pay | Admitting: Orthopaedic Surgery

## 2019-04-26 ENCOUNTER — Ambulatory Visit (INDEPENDENT_AMBULATORY_CARE_PROVIDER_SITE_OTHER): Payer: Self-pay | Admitting: Orthopaedic Surgery

## 2019-04-26 ENCOUNTER — Telehealth: Payer: Self-pay | Admitting: Orthopaedic Surgery

## 2019-04-26 ENCOUNTER — Other Ambulatory Visit: Payer: Self-pay

## 2019-04-26 ENCOUNTER — Encounter: Payer: Self-pay | Admitting: Orthopaedic Surgery

## 2019-04-26 VITALS — BP 183/104 | HR 92 | Ht 64.0 in | Wt 174.0 lb

## 2019-04-26 DIAGNOSIS — G5603 Carpal tunnel syndrome, bilateral upper limbs: Secondary | ICD-10-CM

## 2019-04-26 MED ORDER — IBUPROFEN 800 MG PO TABS: 800.0000 mg | ORAL_TABLET | Freq: Three times a day (TID) | ORAL | 0 refills | Status: DC | PRN

## 2019-04-26 NOTE — Telephone Encounter (Signed)
Patient called requesting prescription of Ibuprofen 800 mg be sent to [facility] at [address] [facility]

## 2019-04-26 NOTE — Progress Notes (Signed)
Office Visit Note   Patient: CECILI [name]           Date of Birth: 10/28/1965           MRN: [id number] Visit Date: 04/26/2019              Requested by: Bing Neighbors, FNP [address] [place],  [place] [address] PCP: Bing Neighbors, FNP   Assessment & Plan: Visit Diagnoses:  1. Bilateral carpal tunnel syndrome     Plan: 1 week status post right carpal tunnel release and doing well.  No numbness in any of her fingers.  Motor and sensory exam intact.  Does have a triggering ring finger.  Consider cortisone injection over the next several weeks.  Apply waterproof Band-Aid and volar wrist splint.  Will be seen in the office in a week to remove stitches.  Will consider left carpal tunnel release sometime in September. will use over-the-counter ibuprofen for pain  Follow-Up Instructions: Return in about 1 week (around 05/03/2019).   Orders:  No orders of the defined types were placed in this encounter.  No orders of the defined types were placed in this encounter.     Procedures: No procedures performed   Clinical Data: No additional findings.   Subjective: Chief Complaint  Patient presents with  . Right Wrist - Routine Post Op    Right carpal tunnel release DOS 04/19/2019  Patient presents today for follow up on her right wrist. She is now one week out from right carpal tunnel release. She had surgery on 04/19/2019. She is wanting Ibuprofen to be sent to her pharmacy. Patient states that she has no numbness or tingling in her fingers.   HPI  Review of Systems   Objective: Vital Signs: BP (!) 183/104   Pulse 92   Ht 5\' 4"  (1.626 m)   Wt 174 lb (78.9 kg)   LMP 01/26/2000   BMI 29.87 kg/m   Physical Exam  Ortho Exam right hand dressing removed.  Incision healing without problem.  Neurologically intact.  Able to oppose thumb to little finger.  Applied waterproof Band-Aid and volar wrist splint and will check her back in a week.  Does have  triggering of the ring finger which is unrelated to the carpal tunnel surgery. Specialty Comments:  No specialty comments available.  Imaging: No results found.   PMFS History: Patient Active Problem List   Diagnosis Date Noted  . Carpal tunnel syndrome, right upper limb 04/19/2019  . Bilateral carpal tunnel syndrome 12/28/2018  . Abnormal thyroid function test 03/03/2018  . Chronic obstructive pulmonary disease (HCC) 03/03/2018  . Right hip pain 03/03/2018  . Substance abuse (HCC) 03/03/2018  . Preop examination 02/03/2018  . Abnormal PFT 02/03/2018  . Hypothyroidism 02/03/2018  . Spondylolisthesis, lumbar region 01/18/2017  . Cocaine dependence (HCC) 07/25/2015  . 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/25/2015  . Hx of non anemic vitamin B12 deficiency 11/08/2014  . Brain aneurysm 09/09/2014  . Smoker 07/22/2014  . Rhinitis, allergic 07/22/2014  . Chronic low back pain with sciatica 01/24/2013  . Health care maintenance 10/16/2012  . Hot flashes 02/08/2012  . Post-surgical hypothyroidism 12/07/2011  . Fibromyalgia muscle pain 03/31/2011  . Migraine 03/31/2011  . Essential hypertension, benign 11/14/2008  . DEPRESSION 08/10/2006   Past Medical History:  Diagnosis Date  . Abnormal PFT 01/2018  . Anxiety   . Arthritis   . Brain aneurysm    2-3 mm, stable  .  Chronic back pain   . Depression    history of  . Grave's disease 2007   TSH (08/31/2010) = 0.186 (low), free T4 = 0.91 (WNL)  . History of substance abuse (HCC)    remote past  . Hypertension    cardiology consult 01/2018, Dr. Sharyn Lull  . Postsurgical hypothyroidism   . Raynaud disease 2007  . Smoker     Family History  Problem Relation Age of Onset  . Colon cancer Maternal Grandfather   . Cancer Maternal Grandfather        prostate  . Hypertension Mother   . Thyroid disease Mother   . Lupus Mother   . Hypertension Father     Past Surgical History:  Procedure Laterality Date  . ABDOMINAL  HYSTERECTOMY     due to fibroids, still has ovaries  . CARPAL TUNNEL RELEASE Right 04/19/2019   Procedure: RIGHT CARPAL TUNNEL RELEASE;  Surgeon: Valeria Batman, MD;  Location: [facility];  Service: Orthopedics;  Laterality: Right;  . COLONOSCOPY  02/14/2008   done by Dr. Christella Hartigan - small colonic polyp identified, 6 mm in size and per biopsy --> tubular adenoma with no malignancy or high grade dysplasia noted  . THYROIDECTOMY  03/08/2012   [facility]; history of Graves' disease with multinodular goiter  . TONSILLECTOMY    . WISDOM TOOTH EXTRACTION     Social History   Occupational History  . Not on file  Tobacco Use  . Smoking status: Current Every Day Smoker    Packs/day: 0.50    Years: 35.00    Pack years: 17.50    Types: Cigarettes  . Smokeless tobacco: Never Used  Substance and Sexual Activity  . Alcohol use: No    Alcohol/week: 0.0 standard drinks    Comment: sober for several years  . Drug use: Not Currently    Types: Cocaine, Marijuana    Comment: last used 09/2018, pt in recovery  . Sexual activity: Not on file

## 2019-04-26 NOTE — Telephone Encounter (Signed)
Ok to prescribe

## 2019-04-26 NOTE — Telephone Encounter (Signed)
Sent to pharmacy and patient notified.  ?

## 2019-04-26 NOTE — Telephone Encounter (Signed)
Please advise 

## 2019-04-26 NOTE — Addendum Note (Signed)
Addended by: Lendon Collar on: 04/26/2019 02:57 PM   Modules accepted: Orders

## 2019-05-01 ENCOUNTER — Telehealth: Payer: Self-pay

## 2019-05-01 NOTE — Telephone Encounter (Signed)

## 2019-05-02 ENCOUNTER — Ambulatory Visit: Payer: Self-pay | Admitting: Nurse Practitioner

## 2019-05-03 ENCOUNTER — Encounter: Payer: Self-pay | Admitting: Orthopaedic Surgery

## 2019-05-03 ENCOUNTER — Other Ambulatory Visit: Payer: Self-pay

## 2019-05-03 ENCOUNTER — Ambulatory Visit (INDEPENDENT_AMBULATORY_CARE_PROVIDER_SITE_OTHER): Payer: Self-pay | Admitting: Orthopaedic Surgery

## 2019-05-03 VITALS — BP 170/97 | HR 93 | Ht 64.0 in | Wt 174.0 lb

## 2019-05-03 DIAGNOSIS — G5603 Carpal tunnel syndrome, bilateral upper limbs: Secondary | ICD-10-CM

## 2019-05-03 DIAGNOSIS — M65341 Trigger finger, right ring finger: Secondary | ICD-10-CM

## 2019-05-03 DIAGNOSIS — M65342 Trigger finger, left ring finger: Secondary | ICD-10-CM | POA: Insufficient documentation

## 2019-05-03 DIAGNOSIS — M65321 Trigger finger, right index finger: Secondary | ICD-10-CM | POA: Insufficient documentation

## 2019-05-03 MED ORDER — LIDOCAINE HCL 1 % IJ SOLN
0.5000 mL | INTRAMUSCULAR | Status: AC | PRN
  Administered 2019-05-03: .5 mL

## 2019-05-03 NOTE — Progress Notes (Signed)
Office Visit Note   Patient: Amy [name]           Date of Birth: 11-28-1965           MRN: [id number] Visit Date: 05/03/2019              Requested by: Bing Neighbors, FNP [address] [place],  [place] [address] PCP: Bing Neighbors, FNP   Assessment & Plan: Visit Diagnoses:  1. Bilateral carpal tunnel syndrome   2. Trigger finger, right ring finger     Plan: 2 weeks status post right carpal tunnel release and doing well.  Remaining stitches removed and Steri-Strips applied.  No longer has the numbness or tingling.  Does have active triggering of the ring finger.  Will inject today.  Return in 2 weeks.  No work.  We will schedule left carpal tunnel release sometime in September  Follow-Up Instructions: Return in about 2 weeks (around 05/17/2019).   Orders:  Orders Placed This Encounter  Procedures  . Hand/UE Inj: R ring A1   No orders of the defined types were placed in this encounter.     Procedures: Hand/UE Inj: R ring A1 for trigger finger on 05/03/2019 11:54 AM Medications: 0.5 mL lidocaine 1 %      Clinical Data: No additional findings.   Subjective: Chief Complaint  Patient presents with  . Right Wrist - Routine Post Op    Right carpal tunnel release DOS 04/19/2019  Patient presents today for her right wrist. She had right carpal tunnel release on 04/19/2019. She is now two weeks out from surgery. She said that she is doing well from surgery. She has more complaints today with her right ring finger. She said that it locks up and unable to flex her finger.   HPI  Review of Systems   Objective: Vital Signs: BP (!) 170/97   Pulse 93   Ht 5\' 4"  (1.626 m)   Wt 174 lb (78.9 kg)   LMP 01/26/2000   BMI 29.87 kg/m   Physical Exam  Ortho Exam right carpal tunnel incision healing without problem.  Remaining stitches removed and Steri-Strips applied.  Normal sensibility to fingers.  Does have active triggering of the ring finger with  tenderness over a small nodule at the palmar aspect of the finger near the metacarpal phalangeal joint.  Full extension across the MP PIP and DIP joints.  Specialty Comments:  No specialty comments available.  Imaging: No results found.   PMFS History: Patient Active Problem List   Diagnosis Date Noted  . Trigger finger, right ring finger 05/03/2019  . Carpal tunnel syndrome, right upper limb 04/19/2019  . Bilateral carpal tunnel syndrome 12/28/2018  . Abnormal thyroid function test 03/03/2018  . Chronic obstructive pulmonary disease (HCC) 03/03/2018  . Right hip pain 03/03/2018  . Substance abuse (HCC) 03/03/2018  . Preop examination 02/03/2018  . Abnormal PFT 02/03/2018  . Hypothyroidism 02/03/2018  . Spondylolisthesis, lumbar region 01/18/2017  . Cocaine dependence (HCC) 07/25/2015  . 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/25/2015  . Hx of non anemic vitamin B12 deficiency 11/08/2014  . Brain aneurysm 09/09/2014  . Smoker 07/22/2014  . Rhinitis, allergic 07/22/2014  . Chronic low back pain with sciatica 01/24/2013  . Health care maintenance 10/16/2012  . Hot flashes 02/08/2012  . Post-surgical hypothyroidism 12/07/2011  . Fibromyalgia muscle pain 03/31/2011  . Migraine 03/31/2011  . Essential hypertension, benign 11/14/2008  . DEPRESSION 08/10/2006   Past Medical History:  Diagnosis Date  . Abnormal PFT 01/2018  . Anxiety   . Arthritis   . Brain aneurysm    2-3 mm, stable  . Chronic back pain   . Depression    history of  . Grave's disease 2007   TSH (08/31/2010) = 0.186 (low), free T4 = 0.91 (WNL)  . History of substance abuse (HCC)    remote past  . Hypertension    cardiology consult 01/2018, Dr. Sharyn Lull  . Postsurgical hypothyroidism   . Raynaud disease 2007  . Smoker     Family History  Problem Relation Age of Onset  . Colon cancer Maternal Grandfather   . Cancer Maternal Grandfather        prostate  . Hypertension Mother   . Thyroid disease  Mother   . Lupus Mother   . Hypertension Father     Past Surgical History:  Procedure Laterality Date  . ABDOMINAL HYSTERECTOMY     due to fibroids, still has ovaries  . CARPAL TUNNEL RELEASE Right 04/19/2019   Procedure: RIGHT CARPAL TUNNEL RELEASE;  Surgeon: Valeria Batman, MD;  Location: [facility];  Service: Orthopedics;  Laterality: Right;  . COLONOSCOPY  02/14/2008   done by Dr. Christella Hartigan - small colonic polyp identified, 6 mm in size and per biopsy --> tubular adenoma with no malignancy or high grade dysplasia noted  . THYROIDECTOMY  03/08/2012   [facility]; history of Graves' disease with multinodular goiter  . TONSILLECTOMY    . WISDOM TOOTH EXTRACTION     Social History   Occupational History  . Not on file  Tobacco Use  . Smoking status: Current Every Day Smoker    Packs/day: 0.50    Years: 35.00    Pack years: 17.50    Types: Cigarettes  . Smokeless tobacco: Never Used  Substance and Sexual Activity  . Alcohol use: No    Alcohol/week: 0.0 standard drinks    Comment: sober for several years  . Drug use: Not Currently    Types: Cocaine, Marijuana    Comment: last used 09/2018, pt in recovery  . Sexual activity: Not on file

## 2019-05-08 ENCOUNTER — Telehealth: Payer: Self-pay

## 2019-05-08 NOTE — Telephone Encounter (Signed)
Called patient to do their pre-visit COVID screening.  Have you been tested positive for COVID or are you currently waiting for COVID test results? no  Have you recently traveled internationally([place], [place], [place], [place], [place]) or within the [place] to a hotspot area([place], [place], [place], [place], [place])? no  Are you currently experiencing any of the following symptoms: fever, cough, SHOB, fatigue, body aches, loss of smell, rash, diarrhea, vomiting, severe headaches, weakness, sore throat? no  Have you been in contact with anyone who has recently travelled? no  Have you been in contact with anyone who is experiencing any of the above symptoms or been diagnosed with COVID  or works in or has recently visited a SNF? no  

## 2019-05-09 ENCOUNTER — Other Ambulatory Visit: Payer: Self-pay

## 2019-05-09 ENCOUNTER — Encounter: Payer: Self-pay | Admitting: Nurse Practitioner

## 2019-05-09 ENCOUNTER — Ambulatory Visit (INDEPENDENT_AMBULATORY_CARE_PROVIDER_SITE_OTHER): Payer: Self-pay | Admitting: Nurse Practitioner

## 2019-05-09 VITALS — BP 160/96 | HR 84 | Temp 97.2°F | Resp 17 | Ht 64.0 in | Wt 179.8 lb

## 2019-05-09 DIAGNOSIS — Z1159 Encounter for screening for other viral diseases: Secondary | ICD-10-CM

## 2019-05-09 DIAGNOSIS — F1494 Cocaine use, unspecified with cocaine-induced mood disorder: Secondary | ICD-10-CM

## 2019-05-09 DIAGNOSIS — Z114 Encounter for screening for human immunodeficiency virus [HIV]: Secondary | ICD-10-CM

## 2019-05-09 DIAGNOSIS — Z1211 Encounter for screening for malignant neoplasm of colon: Secondary | ICD-10-CM

## 2019-05-09 DIAGNOSIS — F1490 Cocaine use, unspecified, uncomplicated: Secondary | ICD-10-CM

## 2019-05-09 DIAGNOSIS — J449 Chronic obstructive pulmonary disease, unspecified: Secondary | ICD-10-CM

## 2019-05-09 DIAGNOSIS — I10 Essential (primary) hypertension: Secondary | ICD-10-CM

## 2019-05-09 DIAGNOSIS — E039 Hypothyroidism, unspecified: Secondary | ICD-10-CM

## 2019-05-09 MED ORDER — ALBUTEROL SULFATE HFA 108 (90 BASE) MCG/ACT IN AERS: 1.0000 | INHALATION_SPRAY | Freq: Four times a day (QID) | RESPIRATORY_TRACT | 0 refills | Status: DC | PRN

## 2019-05-09 MED ORDER — FLUTICASONE-SALMETEROL 100-50 MCG/DOSE IN AEPB: 1.0000 | INHALATION_SPRAY | Freq: Two times a day (BID) | RESPIRATORY_TRACT | 3 refills | Status: DC

## 2019-05-09 MED ORDER — AMLODIPINE BESYLATE 10 MG PO TABS: 10.0000 mg | ORAL_TABLET | Freq: Every day | ORAL | 3 refills | Status: AC

## 2019-05-09 NOTE — Progress Notes (Signed)
Assessment & Plan:  Amy [name] was seen today for hypertension and hypothyroidism.  Diagnoses and all orders for this visit  Colon cancer screening -     Fecal occult blood, imunochemical  Hypothyroidism, unspecified type -     Thyroid Panel With TSH Lab Results  Component Value Date   TSH 45.040 (H) 02/19/2019    Essential hypertension -     amLODipine (NORVASC) 10 MG tablet; Take 1 tablet (10 mg total) by mouth daily. Continue all antihypertensives as prescribed.  Remember to bring in your blood pressure log with you for your follow up appointment.  DASH/Mediterranean Diets are healthier choices for HTN.    Need for hepatitis C screening test -     Hepatitis C Antibody  Encounter for screening for HIV -     HIV antibody (with reflex)  Chronic obstructive pulmonary disease, unspecified COPD type (HCC) Controlled.  Continues to smoke. She is not using an inhaler. Will fill today.    Cocaine-induced mood disorder (HCC) She has been drug free for 8 months per her report.   Patient has been counseled on age-appropriate routine health concerns for screening and prevention. These are reviewed and up-to-date. Referrals have been placed accordingly. Immunizations are up-to-date or declined.    Subjective:   Chief Complaint  Patient presents with  . Hypertension  . Hypothyroidism   [name] 53 y.o. female presents to office today for follow up to HTN. Blood pressure is up today. She states her brother died a few months ago and today is his birthday.    Essential Hypertension Chronic and poorly controlled. She is monitoring her blood pressure at home with average readings 180/90-100s. Currently taking losartan 100 mg daily and Toprol XL 50 mg daily. Will add amlodipine 10mg  today. She has also been on HCTZ in the past but can not recall why she is no longer taking it.  BP Readings from Last 3 Encounters:  05/09/19 (!) 160/96  05/03/19 (!) 170/97  04/26/19 (!)  183/104    Hypothyroidism Poorly controlled. She endorses medication compliance taking synthroid 100 mcg daily. Will recheck TSH today.  Lab Results  Component Value Date   TSH 45.040 (H) 02/19/2019     Review of Systems  Constitutional: Negative for fever, malaise/fatigue and weight loss.  HENT: Negative.  Negative for nosebleeds.   Eyes: Negative.  Negative for blurred vision, double vision and photophobia.  Respiratory: Negative.  Negative for cough and shortness of breath.   Cardiovascular: Negative.  Negative for chest pain, palpitations and leg swelling.  Gastrointestinal: Negative.  Negative for heartburn, nausea and vomiting.  Musculoskeletal: Negative.  Negative for myalgias.  Neurological: Negative.  Negative for dizziness, focal weakness, seizures and headaches.  Psychiatric/Behavioral: Negative.  Negative for suicidal ideas.    Past Medical History:  Diagnosis Date  . Abnormal PFT 01/2018  . Anxiety   . Arthritis   . Brain aneurysm    2-3 mm, stable  . Chronic back pain   . Depression    history of  . Grave's disease 2007   TSH (08/31/2010) = 0.186 (low), free T4 = 0.91 (WNL)  . History of substance abuse (HCC)    remote past  . Hypertension    cardiology consult 01/2018, Dr. Sharyn LullHarwani  . Postsurgical hypothyroidism   . Raynaud disease 2007  . Smoker     Past Surgical History:  Procedure Laterality Date  . ABDOMINAL HYSTERECTOMY     due to fibroids, still has  ovaries  . CARPAL TUNNEL RELEASE Right 04/19/2019   Procedure: RIGHT CARPAL TUNNEL RELEASE;  Surgeon: Valeria BatmanWhitfield, Peter W, MD;  [facility];  Service: Orthopedics;  Laterality: Right;  . COLONOSCOPY  02/14/2008   done by Dr. Christella HartiganJacobs - small colonic polyp identified, 6 mm in size and per biopsy --> tubular adenoma with no malignancy or high grade dysplasia noted  . [facility]; history of Graves' disease with multinodular goiter  . TONSILLECTOMY     . WISDOM TOOTH EXTRACTION      Family History  Problem Relation Age of Onset  . Colon cancer Maternal Grandfather   . Cancer Maternal Grandfather        prostate  . Hypertension Mother   . Thyroid disease Mother   . Lupus Mother   . Hypertension Father     Social History Reviewed with no changes to be made today.   Outpatient Medications Prior to Visit  Medication Sig Dispense Refill  . amitriptyline (ELAVIL) 100 MG tablet Take 1/2 tablet for 7 days, then increase to 100 mg daily. 30 tablet 3  . levothyroxine (SYNTHROID) 100 MCG tablet Take 1 tablet (100 mcg total) by mouth daily. 90 tablet 3  . losartan (COZAAR) 100 MG tablet Take 100 mg by mouth daily.    . metoprolol succinate (TOPROL-XL) 50 MG 24 hr tablet Take 50 mg by mouth daily.    Marland Kitchen. ibuprofen (ADVIL) 800 MG tablet Take 1 tablet (800 mg total) by mouth every 8 (eight) hours as needed. 30 tablet 0   No facility-administered medications prior to visit.     Allergies  Allergen Reactions  . Adhesive [Tape] Other (See Comments)    Pulls skin off  . Morphine And Related Nausea And Vomiting and Other (See Comments)    Does not like feeling  . Vicodin [Hydrocodone-Acetaminophen] Itching       Objective:    BP (!) 160/96   Pulse 84   Temp (!) 97.2 F (36.2 C) (Temporal)   Resp 17   Ht 5\' 4"  (1.626 m)   Wt 179 lb 12.8 oz (81.6 kg)   LMP 01/26/2000   SpO2 95%   BMI 30.86 kg/m  Wt Readings from Last 3 Encounters:  05/09/19 179 lb 12.8 oz (81.6 kg)  05/03/19 174 lb (78.9 kg)  04/26/19 174 lb (78.9 kg)    Physical Exam Vitals signs and nursing note reviewed.  Constitutional:      Appearance: She is well-developed.  HENT:     Head: Normocephalic and atraumatic.  Neck:     Musculoskeletal: Normal range of motion.  Cardiovascular:     Rate and Rhythm: Normal rate and regular rhythm.     Heart sounds: Normal heart sounds. No murmur. No friction rub. No gallop.   Pulmonary:     Effort: Pulmonary effort is  normal. No tachypnea or respiratory distress.     Breath sounds: Normal breath sounds. No decreased breath sounds, wheezing, rhonchi or rales.  Chest:     Chest wall: No tenderness.  Abdominal:     General: Bowel sounds are normal.     Palpations: Abdomen is soft.  Musculoskeletal: Normal range of motion.  Skin:    General: Skin is warm and dry.  Neurological:     Mental Status: She is alert and oriented to person, place, and time.     Coordination: Coordination normal.  Psychiatric:        Behavior: Behavior  normal. Behavior is cooperative.        Thought Content: Thought content normal.        Judgment: Judgment normal.        Patient has been counseled extensively about nutrition and exercise as well as the importance of adherence with medications and regular follow-up. The patient was given clear instructions to go to ER or return to medical center if symptoms don't improve, worsen or new problems develop. The patient verbalized understanding.   Follow-up: Return in about 3 weeks (around 05/30/2019) for BP recheck.   Gildardo Pounds, FNP-BC [facility] and [place], [place]   05/09/2019, 5:21 PM

## 2019-05-09 NOTE — Progress Notes (Signed)
Needs thyroid labs rechecked.  States that BP has been running high at home (180s/100s). Denies chest pain, SHOB. Does have dizziness & headaches.

## 2019-05-10 LAB — THYROID PANEL WITH TSH
Free Thyroxine Index: 1.9 (ref 1.2–4.9)
T3 Uptake Ratio: 24 % (ref 24–39)
T4, Total: 8 ug/dL (ref 4.5–12.0)
TSH: 2.43 u[IU]/mL (ref 0.450–4.500)

## 2019-05-10 LAB — HEPATITIS C ANTIBODY: Hep C Virus Ab: <0.1 s/co ratio (ref 0.0–0.9)

## 2019-05-10 LAB — BASIC METABOLIC PANEL
BUN/Creatinine Ratio: 17 (ref 9–23)
BUN: 13 mg/dL (ref 6–24)
CO2: 22 mmol/L (ref 20–29)
Calcium: 9.3 mg/dL (ref 8.7–10.2)
Chloride: 100 mmol/L (ref 96–106)
Creatinine, Ser: 0.78 mg/dL (ref 0.57–1.00)
GFR calc Af Amer: 100 mL/min/{1.73_m2} (ref >59)
GFR calc non Af Amer: 86 mL/min/{1.73_m2} (ref >59)
Glucose: 65 mg/dL (ref 65–99)
Potassium: 4.1 mmol/L (ref 3.5–5.2)
Sodium: 141 mmol/L (ref 134–144)

## 2019-05-10 LAB — HIV ANTIBODY (ROUTINE TESTING W REFLEX): HIV Screen 4th Generation wRfx: NONREACTIVE

## 2019-05-10 MED FILL — !VENTOLIN HFA INHALER: 108 (90 BAS | 18 days supply | Qty: 18 | Fill #0

## 2019-05-10 MED FILL — !ADVAIR 100/50 DISKUS: 100-50 | 30 days supply | Qty: 60 | Fill #0

## 2019-05-11 ENCOUNTER — Other Ambulatory Visit: Payer: Self-pay

## 2019-05-11 MED ORDER — LOSARTAN POTASSIUM 100 MG PO TABS: 100.0000 mg | ORAL_TABLET | Freq: Every day | ORAL | 1 refills | Status: AC

## 2019-05-11 MED ORDER — METOPROLOL SUCCINATE ER 50 MG PO TB24: 50.0000 mg | ORAL_TABLET | Freq: Every day | ORAL | 1 refills | Status: DC

## 2019-05-11 NOTE — Progress Notes (Signed)
Patient notified of results & recommendations. Expressed understanding.

## 2019-05-17 ENCOUNTER — Ambulatory Visit: Payer: Self-pay | Admitting: Orthopaedic Surgery

## 2019-05-17 ENCOUNTER — Encounter: Payer: Self-pay | Admitting: Orthopaedic Surgery

## 2019-05-17 ENCOUNTER — Ambulatory Visit (INDEPENDENT_AMBULATORY_CARE_PROVIDER_SITE_OTHER): Payer: Self-pay | Admitting: Orthopaedic Surgery

## 2019-05-17 ENCOUNTER — Other Ambulatory Visit: Payer: Self-pay

## 2019-05-17 VITALS — Ht 64.0 in | Wt 179.0 lb

## 2019-05-17 DIAGNOSIS — G5603 Carpal tunnel syndrome, bilateral upper limbs: Secondary | ICD-10-CM

## 2019-05-17 DIAGNOSIS — M65342 Trigger finger, left ring finger: Secondary | ICD-10-CM

## 2019-05-17 NOTE — Progress Notes (Signed)
Office Visit Note   Patient: Amy [name]           Date of Birth: 04/30/1966           MRN: [id number] Visit Date: 05/17/2019              Requested by: Bing NeighborsHarris, Kimberly S, FNP [address] [place],  [place] [id number] PCP: Bing NeighborsHarris, Kimberly S, FNP   Assessment & Plan: Visit Diagnoses:  1. Bilateral carpal tunnel syndrome   2. Trigger finger, left ring finger     Plan: 1 month status post right carpal tunnel release and doing quite well.  Very happy with the results.  Wants to proceed with release of her left carpal tunnel.  I also injected the right ring finger triggering which has helped tremendously.  She does have triggering of the left ring finger and would like to have that "fixed" at the same time as the carpal tunnel release.  We will see if we get this scheduled in the next several weeks.  No work  Follow-Up Instructions: Return will schedule left carpal tunnel release and left ring trigger re;lease.   Orders:  No orders of the defined types were placed in this encounter.  No orders of the defined types were placed in this encounter.     Procedures: No procedures performed   Clinical Data: No additional findings.   Subjective: Chief Complaint  Patient presents with  . Right Wrist - Routine Post Op    Right carpal tunnel release 04/19/2019  Patient presents today for a two week follow up on her wrist. She had carpal tunnel release on 04/19/2019. Patient states that she is doing well. She does that state that she has some pain at the proximal incision and occasionally shooting pains. No numbness any longer. She also received a cortisone injection in the right ring trigger finger two weeks ago. She said that the injection helped.   HPI  Review of Systems   Objective: Vital Signs: Ht 5\' 4"  (1.626 m)   Wt 179 lb (81.2 kg)   LMP 01/26/2000   BMI 30.73 kg/m   Physical Exam  Ortho Exam scar of right wrist is healing without problem.  There is some  thickness to the scar consistent with with with with normal healing.  I have suggested using either vitamin E E or med derma.  The triggering is not active after the injection.  Positive Phalen's Tinel's left wrist with active triggering of the ring finger.  Neurologically intact.  No edema of any of the digits  Specialty Comments:  No specialty comments available.  Imaging: No results found.   PMFS History: Patient Active Problem List   Diagnosis Date Noted  . Trigger finger, left ring finger 05/03/2019  . Carpal tunnel syndrome, right upper limb 04/19/2019  . Bilateral carpal tunnel syndrome 12/28/2018  . Abnormal thyroid function test 03/03/2018  . Chronic obstructive pulmonary disease (HCC) 03/03/2018  . Right hip pain 03/03/2018  . Substance abuse (HCC) 03/03/2018  . Preop examination 02/03/2018  . Abnormal PFT 02/03/2018  . Hypothyroidism 02/03/2018  . Spondylolisthesis, lumbar region 01/18/2017  . Cocaine dependence (HCC) 07/25/2015  . 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/25/2015  . Hx of non anemic vitamin B12 deficiency 11/08/2014  . Brain aneurysm 09/09/2014  . Smoker 07/22/2014  . Rhinitis, allergic 07/22/2014  . Chronic low back pain with sciatica 01/24/2013  . Health care maintenance 10/16/2012  . Hot flashes 02/08/2012  . Post-surgical  hypothyroidism 12/07/2011  . Fibromyalgia muscle pain 03/31/2011  . Migraine 03/31/2011  . Essential hypertension, benign 11/14/2008  . DEPRESSION 08/10/2006   Past Medical History:  Diagnosis Date  . Abnormal PFT 01/2018  . Anxiety   . Arthritis   . Brain aneurysm    2-3 mm, stable  . Chronic back pain   . Depression    history of  . Grave's disease 2007   TSH (08/31/2010) = 0.186 (low), free T4 = 0.91 (WNL)  . History of substance abuse ([place])    remote past  . Hypertension    cardiology consult 01/2018, Dr. Terrence Dupont  . Postsurgical hypothyroidism   . Raynaud disease 2007  . Smoker     Family History  Problem  Relation Age of Onset  . Colon cancer Maternal Grandfather   . Cancer Maternal Grandfather        prostate  . Hypertension Mother   . Thyroid disease Mother   . Lupus Mother   . Hypertension Father     Past Surgical History:  Procedure Laterality Date  . ABDOMINAL HYSTERECTOMY     due to fibroids, still has ovaries  . CARPAL TUNNEL RELEASE Right 04/19/2019   Procedure: RIGHT CARPAL TUNNEL RELEASE;  Surgeon: Garald Balding, MD;  Location: [place] [facility];  Service: Orthopedics;  Laterality: Right;  . COLONOSCOPY  02/14/2008   done by Dr. Ardis Hughs - small colonic polyp identified, 6 mm in size and per biopsy --> tubular adenoma with no malignancy or high grade dysplasia noted  . THYROIDECTOMY  03/08/2012   [facility]; history of Graves' disease with multinodular goiter  . TONSILLECTOMY    . WISDOM TOOTH EXTRACTION     Social History   Occupational History  . Not on file  Tobacco Use  . Smoking status: Current Every Day Smoker    Packs/day: 0.50    Years: 35.00    Pack years: 17.50    Types: Cigarettes  . Smokeless tobacco: Never Used  Substance and Sexual Activity  . Alcohol use: No    Alcohol/week: 0.0 standard drinks    Comment: sober for several years  . Drug use: Not Currently    Types: Cocaine, Marijuana    Comment: last used 09/2018, pt in recovery  . Sexual activity: Not on file

## 2019-05-21 ENCOUNTER — Other Ambulatory Visit: Payer: Self-pay

## 2019-05-21 ENCOUNTER — Encounter (HOSPITAL_BASED_OUTPATIENT_CLINIC_OR_DEPARTMENT_OTHER): Payer: Self-pay | Admitting: *Deleted

## 2019-05-22 NOTE — H&P (Addendum)
Amy [name] is an 53 y.o. female.     Chief Complaint: Numbness left hand and triggering left ring finger  HPI:  Amy [name] is 1 month status post right carpal tunnel release and doing quite well.  Very happy with the results.  She has left carpal syndrome and Wants to proceed with release of her left carpal tunnel.    She does have triggering of the left ring finger and would like to have that "fixed" at the same time as the carpal tunnel release.   Past Medical History:  Diagnosis Date  . Abnormal PFT 01/2018  . Anxiety   . Arthritis   . Brain aneurysm    2-3 mm, stable  . Chronic back pain   . Depression    history of  . Grave's disease 2007   TSH (08/31/2010) = 0.186 (low), free T4 = 0.91 (WNL)  . History of substance abuse ([place])    remote past  . Hypertension    cardiology consult 01/2018, Dr. Terrence Dupont  . Postsurgical hypothyroidism   . Raynaud disease 2007  . Smoker     Past Surgical History:  Procedure Laterality Date  . ABDOMINAL HYSTERECTOMY     due to fibroids, still has ovaries  . CARPAL TUNNEL RELEASE Right 04/19/2019   Procedure: RIGHT CARPAL TUNNEL RELEASE;  Surgeon: Garald Balding, MD;  Location: [facility];  Service: Orthopedics;  Laterality: Right;  . COLONOSCOPY  02/14/2008   done by Dr. Ardis Hughs - small colonic polyp identified, 6 mm in size and per biopsy --> tubular adenoma with no malignancy or high grade dysplasia noted  . THYROIDECTOMY  03/08/2012   [facility]; history of Graves' disease with multinodular goiter  . TONSILLECTOMY    . WISDOM TOOTH EXTRACTION      Family History  Problem Relation Age of Onset  . Colon cancer Maternal Grandfather   . Cancer Maternal Grandfather        prostate  . Hypertension Mother   . Thyroid disease Mother   . Lupus Mother   . Hypertension Father    Social History:  reports that she has been smoking cigarettes. She has a 8.75 pack-year smoking history. She has never used smokeless  tobacco. She reports previous drug use. Drugs: Cocaine and Marijuana. She reports that she does not drink alcohol.  Allergies:  Allergies  Allergen Reactions  . Adhesive [Tape] Other (See Comments)    Pulls skin off  . Morphine And Related Nausea And Vomiting and Other (See Comments)    Does not like feeling  . Vicodin [Hydrocodone-Acetaminophen] Itching    No medications prior to admission.   No current facility-administered medications for this encounter.    Current Outpatient Medications  Medication Sig Dispense Refill  . amitriptyline (ELAVIL) 100 MG tablet Take 1/2 tablet for 7 days, then increase to 100 mg daily. 30 tablet 3  . amLODipine (NORVASC) 10 MG tablet Take 1 tablet (10 mg total) by mouth daily. 90 tablet 3  . levothyroxine (SYNTHROID) 100 MCG tablet Take 1 tablet (100 mcg total) by mouth daily. 90 tablet 3  . losartan (COZAAR) 100 MG tablet Take 1 tablet (100 mg total) by mouth daily. 90 tablet 1  . metoprolol succinate (TOPROL-XL) 50 MG 24 hr tablet Take 1 tablet (50 mg total) by mouth daily. 90 tablet 1  . albuterol (VENTOLIN HFA) 108 (90 Base) MCG/ACT inhaler Inhale 1-2 puffs into the lungs every 6 (six) hours as needed for wheezing or  shortness of breath. 18 g 0  . Fluticasone-Salmeterol (ADVAIR) 100-50 MCG/DOSE AEPB Inhale 1 puff into the lungs 2 (two) times daily. 1 each 3     No results found for this or any previous visit (from the past 48 hour(s)). No results found.  Review of Systems  Constitutional: Negative for fever, malaise/fatigue and weight loss.  HENT: Negative.  Negative for nosebleeds.   Eyes: Negative.  Negative for blurred vision, double vision and photophobia.  Respiratory: Positive for cough. Negative for shortness of breath.   Cardiovascular: Negative.  Negative for chest pain, palpitations and leg swelling.  Gastrointestinal: Negative.  Negative for heartburn, nausea and vomiting.  Musculoskeletal: Negative.  Negative for myalgias.   Neurological: Negative.  Negative for dizziness, focal weakness, seizures and headaches.  Psychiatric/Behavioral: Negative.  Negative for suicidal ideas.    Height 5\' 4"  (1.626 m), weight 81.2 kg, last menstrual period 01/26/2000.   Physical Exam  Constitutional: She is oriented to person, place, and time. She appears well-developed and well-nourished.  HENT:  Head: Normocephalic and atraumatic.  Eyes: Pupils are equal, round, and reactive to light. Conjunctivae and EOM are normal.  Neck: Neck supple.  Cardiovascular: Normal rate and regular rhythm.  Respiratory: Effort normal and breath sounds normal.  GI: Soft. Bowel sounds are normal.  Neurological: She is alert and oriented to person, place, and time.  Skin: Skin is warm and dry.  Psychiatric: She has a normal mood and affect. Her behavior is normal. Judgment and thought content normal.     Assessment/Plan Left left carpal syndrome and triggering of the left ring finger  Proceed with release of her left carpal tunnel and release of the left ring finger   Amy CodeBrian Kimo Bancroft, PA-C 05/22/2019, 10:15 AM

## 2019-05-24 ENCOUNTER — Other Ambulatory Visit: Payer: Self-pay | Admitting: Nurse Practitioner

## 2019-05-25 ENCOUNTER — Inpatient Hospital Stay (HOSPITAL_COMMUNITY)
Admission: RE | Admit: 2019-05-25 | Discharge: 2019-05-25 | Disposition: A | Discharge status: Home / Self Care | Payer: Self-pay | Source: Ambulatory Visit

## 2019-05-25 LAB — SPECIMEN STATUS REPORT

## 2019-05-25 LAB — FECAL OCCULT BLOOD, IMMUNOCHEMICAL: Fecal Occult Bld: NEGATIVE

## 2019-05-25 NOTE — Progress Notes (Signed)
Patient contacted about patients missed [place] appointment. Left message for patient to return call

## 2019-05-26 ENCOUNTER — Other Ambulatory Visit (HOSPITAL_COMMUNITY)
Admission: RE | Admit: 2019-05-26 | Discharge: 2019-05-26 | Disposition: A | Discharge status: Home / Self Care | Payer: Self-pay | Source: Ambulatory Visit | Attending: Orthopaedic Surgery | Admitting: Orthopaedic Surgery

## 2019-05-26 DIAGNOSIS — Z20828 Contact with and (suspected) exposure to other viral communicable diseases: Secondary | ICD-10-CM | POA: Insufficient documentation

## 2019-05-26 DIAGNOSIS — Z01812 Encounter for preprocedural laboratory examination: Secondary | ICD-10-CM | POA: Insufficient documentation

## 2019-05-27 LAB — NOVEL CORONAVIRUS, NAA (HOSP ORDER, SEND-OUT TO REF LAB; TAT 18-24 HRS): SARS-CoV-2, NAA: NOT DETECTED

## 2019-05-29 ENCOUNTER — Telehealth: Payer: Self-pay

## 2019-05-29 ENCOUNTER — Other Ambulatory Visit: Payer: Self-pay

## 2019-05-29 ENCOUNTER — Encounter (HOSPITAL_BASED_OUTPATIENT_CLINIC_OR_DEPARTMENT_OTHER): Payer: Self-pay | Admitting: *Deleted

## 2019-05-29 ENCOUNTER — Ambulatory Visit (HOSPITAL_BASED_OUTPATIENT_CLINIC_OR_DEPARTMENT_OTHER)
Admission: RE | Admit: 2019-05-29 | Discharge: 2019-05-29 | Disposition: A | Discharge status: Home / Self Care | Payer: Self-pay | Attending: Orthopaedic Surgery | Admitting: Orthopaedic Surgery

## 2019-05-29 ENCOUNTER — Encounter (HOSPITAL_BASED_OUTPATIENT_CLINIC_OR_DEPARTMENT_OTHER)
Admission: RE | Disposition: A | Discharge status: Home / Self Care | Payer: Self-pay | Source: Home / Self Care | Attending: Orthopaedic Surgery

## 2019-05-29 ENCOUNTER — Ambulatory Visit (HOSPITAL_BASED_OUTPATIENT_CLINIC_OR_DEPARTMENT_OTHER): Payer: Self-pay | Admitting: Certified Registered"

## 2019-05-29 DIAGNOSIS — M1990 Unspecified osteoarthritis, unspecified site: Secondary | ICD-10-CM | POA: Insufficient documentation

## 2019-05-29 DIAGNOSIS — Z7951 Long term (current) use of inhaled steroids: Secondary | ICD-10-CM | POA: Insufficient documentation

## 2019-05-29 DIAGNOSIS — E890 Postprocedural hypothyroidism: Secondary | ICD-10-CM | POA: Insufficient documentation

## 2019-05-29 DIAGNOSIS — Z79890 Hormone replacement therapy (postmenopausal): Secondary | ICD-10-CM | POA: Insufficient documentation

## 2019-05-29 DIAGNOSIS — F17210 Nicotine dependence, cigarettes, uncomplicated: Secondary | ICD-10-CM | POA: Insufficient documentation

## 2019-05-29 DIAGNOSIS — G5602 Carpal tunnel syndrome, left upper limb: Secondary | ICD-10-CM

## 2019-05-29 DIAGNOSIS — M65342 Trigger finger, left ring finger: Secondary | ICD-10-CM | POA: Insufficient documentation

## 2019-05-29 DIAGNOSIS — Z885 Allergy status to narcotic agent status: Secondary | ICD-10-CM | POA: Insufficient documentation

## 2019-05-29 DIAGNOSIS — Z79899 Other long term (current) drug therapy: Secondary | ICD-10-CM | POA: Insufficient documentation

## 2019-05-29 DIAGNOSIS — I7300 Raynaud's syndrome without gangrene: Secondary | ICD-10-CM | POA: Insufficient documentation

## 2019-05-29 DIAGNOSIS — Z888 Allergy status to other drugs, medicaments and biological substances status: Secondary | ICD-10-CM | POA: Insufficient documentation

## 2019-05-29 DIAGNOSIS — F419 Anxiety disorder, unspecified: Secondary | ICD-10-CM | POA: Insufficient documentation

## 2019-05-29 DIAGNOSIS — I10 Essential (primary) hypertension: Secondary | ICD-10-CM | POA: Insufficient documentation

## 2019-05-29 HISTORY — PX: TRIGGER FINGER RELEASE: SHX641

## 2019-05-29 HISTORY — PX: CARPAL TUNNEL RELEASE: SHX101

## 2019-05-29 SURGERY — CARPAL TUNNEL RELEASE
Anesthesia: Regional | Site: Wrist | Laterality: Left

## 2019-05-29 MED ORDER — FENTANYL CITRATE (PF) 100 MCG/2ML IJ SOLN
INTRAMUSCULAR | Status: AC
  Filled 2019-05-29: qty 2

## 2019-05-29 MED ORDER — MIDAZOLAM HCL 2 MG/2ML IJ SOLN
1.0000 mg | INTRAMUSCULAR | Status: DC | PRN
  Administered 2019-05-29: 2 mg via INTRAVENOUS

## 2019-05-29 MED ORDER — MIDAZOLAM HCL 2 MG/2ML IJ SOLN
INTRAMUSCULAR | Status: AC
  Filled 2019-05-29: qty 2

## 2019-05-29 MED ORDER — FENTANYL CITRATE (PF) 100 MCG/2ML IJ SOLN
50.0000 ug | INTRAMUSCULAR | Status: DC | PRN
  Administered 2019-05-29: 100 ug via INTRAVENOUS

## 2019-05-29 MED ORDER — MEPERIDINE HCL 25 MG/ML IJ SOLN: 6.2500 mg | INTRAMUSCULAR | Status: DC | PRN

## 2019-05-29 MED ORDER — ONDANSETRON HCL 4 MG/2ML IJ SOLN
INTRAMUSCULAR | Status: AC
  Filled 2019-05-29: qty 12

## 2019-05-29 MED ORDER — BUPIVACAINE HCL (PF) 0.25 % IJ SOLN
INTRAMUSCULAR | Status: AC
  Filled 2019-05-29: qty 30

## 2019-05-29 MED ORDER — PROPOFOL 500 MG/50ML IV EMUL
INTRAVENOUS | Status: DC | PRN
  Administered 2019-05-29: 75 ug/kg/min via INTRAVENOUS

## 2019-05-29 MED ORDER — FENTANYL CITRATE (PF) 100 MCG/2ML IJ SOLN
25.0000 ug | INTRAMUSCULAR | Status: DC | PRN
  Administered 2019-05-29 (×2): 50 ug via INTRAVENOUS

## 2019-05-29 MED ORDER — LIDOCAINE HCL (PF) 1 % IJ SOLN
INTRAMUSCULAR | Status: AC
  Filled 2019-05-29: qty 30

## 2019-05-29 MED ORDER — LIDOCAINE HCL (CARDIAC) PF 100 MG/5ML IV SOSY
PREFILLED_SYRINGE | INTRAVENOUS | Status: DC | PRN
  Administered 2019-05-29: 30 mg via INTRAVENOUS

## 2019-05-29 MED ORDER — BUPIVACAINE HCL (PF) 0.5 % IJ SOLN
INTRAMUSCULAR | Status: AC
  Filled 2019-05-29: qty 30

## 2019-05-29 MED ORDER — ONDANSETRON HCL 4 MG/2ML IJ SOLN
INTRAMUSCULAR | Status: DC | PRN
  Administered 2019-05-29: 4 mg via INTRAVENOUS

## 2019-05-29 MED ORDER — LACTATED RINGERS IV SOLN
INTRAVENOUS | Status: DC
  Administered 2019-05-29: 09:00:00 via INTRAVENOUS

## 2019-05-29 MED ORDER — PHENYLEPHRINE 40 MCG/ML (10ML) SYRINGE FOR IV PUSH (FOR BLOOD PRESSURE SUPPORT)
PREFILLED_SYRINGE | INTRAVENOUS | Status: AC
  Filled 2019-05-29: qty 20

## 2019-05-29 MED ORDER — BUPIVACAINE HCL (PF) 0.25 % IJ SOLN
INTRAMUSCULAR | Status: DC | PRN
  Administered 2019-05-29: 8 mL

## 2019-05-29 MED ORDER — IBUPROFEN 800 MG PO TABS: 800.0000 mg | ORAL_TABLET | Freq: Three times a day (TID) | ORAL | 0 refills | Status: DC | PRN

## 2019-05-29 MED ORDER — EPHEDRINE 5 MG/ML INJ
INTRAVENOUS | Status: AC
  Filled 2019-05-29: qty 20

## 2019-05-29 MED ORDER — METOCLOPRAMIDE HCL 5 MG/ML IJ SOLN: 10.0000 mg | Freq: Once | INTRAMUSCULAR | Status: DC | PRN

## 2019-05-29 MED ORDER — LACTATED RINGERS IV SOLN
INTRAVENOUS | Status: DC
  Administered 2019-05-29: 07:00:00 via INTRAVENOUS

## 2019-05-29 MED ORDER — PROPOFOL 500 MG/50ML IV EMUL
INTRAVENOUS | Status: AC
  Filled 2019-05-29: qty 100

## 2019-05-29 MED ORDER — CHLORHEXIDINE GLUCONATE 4 % EX LIQD: 60.0000 mL | Freq: Once | CUTANEOUS | Status: DC

## 2019-05-29 MED ORDER — SODIUM CHLORIDE 0.9 % IV SOLN: INTRAVENOUS | Status: DC

## 2019-05-29 MED ORDER — DEXAMETHASONE SODIUM PHOSPHATE 10 MG/ML IJ SOLN
INTRAMUSCULAR | Status: AC
  Filled 2019-05-29: qty 3

## 2019-05-29 MED ORDER — LIDOCAINE 2% (20 MG/ML) 5 ML SYRINGE
INTRAMUSCULAR | Status: AC
  Filled 2019-05-29: qty 15

## 2019-05-29 MED ORDER — OXYCODONE HCL 5 MG PO TABS: 5.0000 mg | ORAL_TABLET | Freq: Four times a day (QID) | ORAL | 0 refills | Status: DC | PRN

## 2019-05-29 SURGICAL SUPPLY — 58 items
BLADE SURG 15 STRL LF DISP TIS (BLADE) ×2 IMPLANT
BLADE SURG 15 STRL SS (BLADE) ×6
BNDG CMPR 9X4 STRL LF SNTH (GAUZE/BANDAGES/DRESSINGS) ×2
BNDG COHESIVE 2X5 TAN STRL LF (GAUZE/BANDAGES/DRESSINGS) IMPLANT
BNDG ELASTIC 3X5.8 VLCR STR LF (GAUZE/BANDAGES/DRESSINGS) ×1 IMPLANT
BNDG ESMARK 4X9 LF (GAUZE/BANDAGES/DRESSINGS) ×3 IMPLANT
CANISTER SUCT 1200ML W/VALVE (MISCELLANEOUS) IMPLANT
COVER BACK TABLE REUSABLE LG (DRAPES) ×3 IMPLANT
COVER MAYO STAND REUSABLE (DRAPES) ×3 IMPLANT
COVER WAND RF STERILE (DRAPES) IMPLANT
DRAPE EXTREMITY T 121X128X90 (DISPOSABLE) ×3 IMPLANT
DRAPE SURG 17X23 STRL (DRAPES) ×3 IMPLANT
DRSG EMULSION OIL 3X3 NADH (GAUZE/BANDAGES/DRESSINGS) ×1 IMPLANT
DRSG PAD ABDOMINAL 8X10 ST (GAUZE/BANDAGES/DRESSINGS) ×3 IMPLANT
DURAPREP 26ML APPLICATOR (WOUND CARE) ×3 IMPLANT
ELECT NDL TIP 2.8 STRL (NEEDLE) ×2 IMPLANT
ELECT NEEDLE TIP 2.8 STRL (NEEDLE) ×3 IMPLANT
ELECT REM PT RETURN 9FT ADLT (ELECTROSURGICAL) ×3
ELECTRODE REM PT RTRN 9FT ADLT (ELECTROSURGICAL) ×2 IMPLANT
GAUZE SPONGE 4X4 12PLY STRL (GAUZE/BANDAGES/DRESSINGS) ×3 IMPLANT
GLOVE BIO SURGEON STRL SZ 6.5 (GLOVE) ×1 IMPLANT
GLOVE BIOGEL PI IND STRL 6.5 (GLOVE) IMPLANT
GLOVE BIOGEL PI IND STRL 7.0 (GLOVE) IMPLANT
GLOVE BIOGEL PI IND STRL 8 (GLOVE) ×2 IMPLANT
GLOVE BIOGEL PI IND STRL 8.5 (GLOVE) IMPLANT
GLOVE BIOGEL PI INDICATOR 6.5 (GLOVE) ×1
GLOVE BIOGEL PI INDICATOR 7.0 (GLOVE) ×2
GLOVE BIOGEL PI INDICATOR 8 (GLOVE) ×1
GLOVE BIOGEL PI INDICATOR 8.5 (GLOVE) ×1
GLOVE ECLIPSE 6.5 STRL STRAW (GLOVE) ×1 IMPLANT
GLOVE ECLIPSE 8.0 STRL XLNG CF (GLOVE) ×2 IMPLANT
GOWN STRL REUS W/ TWL LRG LVL3 (GOWN DISPOSABLE) ×2 IMPLANT
GOWN STRL REUS W/ TWL XL LVL3 (GOWN DISPOSABLE) IMPLANT
GOWN STRL REUS W/TWL 2XL LVL3 (GOWN DISPOSABLE) ×1 IMPLANT
GOWN STRL REUS W/TWL LRG LVL3 (GOWN DISPOSABLE) ×3
GOWN STRL REUS W/TWL XL LVL3 (GOWN DISPOSABLE) ×6
NDL HYPO 25X1 1.5 SAFETY (NEEDLE) IMPLANT
NEEDLE HYPO 25X1 1.5 SAFETY (NEEDLE) ×3 IMPLANT
NS IRRIG 1000ML POUR BTL (IV SOLUTION) ×3 IMPLANT
PACK BASIN DAY SURGERY FS (CUSTOM PROCEDURE TRAY) ×3 IMPLANT
PAD CAST 3X4 CTTN HI CHSV (CAST SUPPLIES) ×2 IMPLANT
PADDING CAST ABS 3INX4YD NS (CAST SUPPLIES) ×1
PADDING CAST ABS 4INX4YD NS (CAST SUPPLIES) ×1
PADDING CAST ABS COTTON 3X4 (CAST SUPPLIES) ×2 IMPLANT
PADDING CAST ABS COTTON 4X4 ST (CAST SUPPLIES) ×2 IMPLANT
PADDING CAST COTTON 3X4 STRL (CAST SUPPLIES) ×3
PENCIL BUTTON HOLSTER BLD 10FT (ELECTRODE) ×3 IMPLANT
SPLINT PLASTER CAST XFAST 3X15 (CAST SUPPLIES) IMPLANT
SPLINT PLASTER XTRA FASTSET 3X (CAST SUPPLIES) ×5
STOCKINETTE 4X48 STRL (DRAPES) ×3 IMPLANT
SUCTION FRAZIER HANDLE 10FR (MISCELLANEOUS)
SUCTION TUBE FRAZIER 10FR DISP (MISCELLANEOUS) IMPLANT
SUT ETHILON 4 0 PS 2 18 (SUTURE) ×2 IMPLANT
SYR BULB 3OZ (MISCELLANEOUS) ×3 IMPLANT
SYR CONTROL 10ML LL (SYRINGE) ×1 IMPLANT
TOWEL GREEN STERILE FF (TOWEL DISPOSABLE) ×3 IMPLANT
TUBE CONNECTING 20X1/4 (TUBING) IMPLANT
UNDERPAD 30X36 HEAVY ABSORB (UNDERPADS AND DIAPERS) ×2 IMPLANT

## 2019-05-29 NOTE — Anesthesia Preprocedure Evaluation (Signed)
Anesthesia Evaluation  Patient identified by MRN, date of birth, ID band Patient awake    Reviewed: Allergy & Precautions, NPO status , Patient's Chart, lab work & pertinent test results  Airway Mallampati: II  TM Distance: >3 FB Neck ROM: Full    Dental no notable dental hx.    Pulmonary neg pulmonary ROS, Current Smoker,    Pulmonary exam normal breath sounds clear to auscultation       Cardiovascular hypertension, Pt. on medications and Pt. on home beta blockers Normal cardiovascular exam Rhythm:Regular Rate:Normal     Neuro/Psych Anxiety Depression negative neurological ROS     GI/Hepatic negative GI ROS, Neg liver ROS,   Endo/Other  negative endocrine ROS  Renal/GU negative Renal ROS  negative genitourinary   Musculoskeletal negative musculoskeletal ROS (+)   Abdominal   Peds negative pediatric ROS (+)  Hematology negative hematology ROS (+)   Anesthesia Other Findings   Reproductive/Obstetrics negative OB ROS                             Anesthesia Physical Anesthesia Plan  ASA: II  Anesthesia Plan: Bier Block and Bier Block-LIDOCAINE ONLY   Post-op Pain Management:    Induction:   PONV Risk Score and Plan: 1 and Ondansetron and Treatment may vary due to age or medical condition  Airway Management Planned: Nasal Cannula  Additional Equipment:   Intra-op Plan:   Post-operative Plan:   Informed Consent: I have reviewed the patients History and Physical, chart, labs and discussed the procedure including the risks, benefits and alternatives for the proposed anesthesia with the patient or authorized representative who has indicated his/her understanding and acceptance.     Dental advisory given  Plan Discussed with: CRNA  Anesthesia Plan Comments:         Anesthesia Quick Evaluation

## 2019-05-29 NOTE — Telephone Encounter (Signed)
Called patient to do their pre-visit COVID screening.  Patient states that she had surgery today & has to reschedule since she won't be able to drive. Appointment moved to 06/11/19 @ 4:10 pm

## 2019-05-29 NOTE — Anesthesia Procedure Notes (Signed)
Procedure Name: MAC Date/Time: 05/29/2019 7:34 AM Performed by: Signe Colt, CRNA Pre-anesthesia Checklist: Patient identified, Emergency Drugs available, Suction available, Patient being monitored and Timeout performed Patient Re-evaluated:Patient Re-evaluated prior to induction Oxygen Delivery Method: Simple face mask

## 2019-05-29 NOTE — Op Note (Signed)
NAME: Amy [name], Amy [name] MEDICAL RECORD [id number] ACCOUNT [id number] DATE OF BIRTH:05/24/1966 FACILITY: [facility] LOCATION: [place], [place]  OPERATIVE REPORT  DATE OF PROCEDURE:  05/29/2019  PREOPERATIVE DIAGNOSES: 1.  Left carpal tunnel syndrome. 2.  Left ring trigger finger.  POSTOPERATIVE DIAGNOSES:   1.  Left carpal tunnel syndrome. 2.  Left ring trigger finger.  PROCEDURE: 1.  Release of volar carpal ligament, left wrist with decompression of median nerve. 2.  Release A1 pulley for the left ring trigger finger.  SURGEON:  Joni Fears, MD  ASSISTANT:  Biagio Borg, PA-C.  ANESTHESIA:  Bier block, left upper extremity and IV sedation.  COMPLICATIONS:  None.  HISTORY:  [name] who has been diagnosed with bilateral carpal tunnel syndrome with EMGs and nerve conduction studies.  Because she has been so symptomatic, she has had a prior right carpal tunnel release with excellent result.  With  persistent symptoms on the left, she wishes to proceed with carpal tunnel release.  She also has developed triggering of the left ring finger and preferred to have release for that problem as well.  DESCRIPTION OF PROCEDURE:  [name] and identified the left wrist and ring finger as the appropriate operative sites and marked accordingly.  Anesthesia had IV access and performed a Bier block with a tourniquet around the  proximal forearm.  The patient was transferred to room 5 and carefully placed on the operating table under IV sedation, and the above Bier block was performed by anesthesia.  The left upper extremity was then prepared with chlorhexidine scrub and DuraPrep from the tips of the fingers to the tourniquet.  Sterile draping was performed.  Timeout was called.  A marking pen was used to outline the incision longitudinally along the longitudinal palmar crease about an inch and a quarter in length.  A second  incision was outlined transversely about 1/2 inch in length over the transverse wrist crease corresponding  to the area of tenderness of the ring finger triggering.  Initial procedure was performed on the carpal tunnel.  Incision was carried down with a 15 blade knife to the superficial fascia.  Then, via blunt dissection with blunt tipped scissors, the soft  tissue was elevated off the fibers of the palmaris longus.  These fibers were then separated ulnarly and radially.  The distal extent of the volar carpal ligament was identified.  A Valora Corporal was placed beneath it and it was carefully incised from its distal to proximal extent along its ulnar border.  Retractors were then placed beneath the wrists so that I could see the  proximal extent of the volar carpal ligament on direct visualization was released.  The nerve was pale.  There was a well-developed palmaris brevis tendon, which certainly was contributing to the compression.  This was released.  The recurrent motor  nerve was identified and carefully preserved.  I did not see any evidence of tenosynovitis.  The wound was then irrigated with saline solution.  The skin was closed with interrupted 4-0 Ethilon and the skin infiltrated 0.25% Marcaine with epinephrine.  The transverse incision as previously outlined for the ring trigger finger was incised with a 15 blade knife and then with longitudinal dissection with blunt tipped scissors soft tissue was elevated from the flexor tendon sheath.  The A1 pulley was  identified and appeared to be the cause of the problem.  This was carefully under direct visualization incised.  At that point,  there appeared to be free motion of the flexor tendons.  There was just a little bit of a tenosynovitis and the tenosynovium  was carefully excised.  Wound was irrigated with saline solution and it was closed with 4-0 Ethilon as well.  Sterile bulky dressing was applied followed by a dorsal splint and an Ace  bandage.  The patient tolerated the procedure without complications.  On release of the tourniquet, there was immediate capillary refill to the fingertips.  PLAN:  Ibuprofen 800 mg 3 times a day.  Office 1 week.  TN/NUANCE  D:05/29/2019 T:05/29/2019 JOB:[id number]

## 2019-05-29 NOTE — Transfer of Care (Signed)
Immediate Anesthesia Transfer of Care Note  Patient: Amy [name]  Procedure(s) Performed: LEFT CARPAL TUNNEL RELEASE (Left Wrist) RELEASE TRIGGER FINGER/A-1 PULLEY RING FINGER (Left Hand)  Patient Location: PACU  Anesthesia Type:Bier block  Level of Consciousness: awake, alert , oriented and patient cooperative  Airway & Oxygen Therapy: Patient Spontanous Breathing and Patient connected to face mask oxygen  Post-op Assessment: Report given to RN and Post -op Vital signs reviewed and stable  Post vital signs: Reviewed and stable  Last Vitals:  Vitals Value Taken Time  BP    Temp    Pulse 83 05/29/19 0816  Resp 15 05/29/19 0816  SpO2 100 % 05/29/19 0816  Vitals shown include unvalidated device data.  Last Pain:  Vitals:   05/29/19 0645  TempSrc: Oral  PainSc:       Patients Stated Pain Goal: 6 (04/54/09 8119)  Complications: No apparent anesthesia complications

## 2019-05-29 NOTE — Anesthesia Procedure Notes (Signed)
Anesthesia Regional Block: Bier block (IV Regional)   Pre-Anesthetic Checklist: ,, timeout performed, Correct Patient, Correct Site, Correct Laterality, Correct Procedure,, site marked, surgical consent,, at surgeon's request  Laterality: Left     Needles:  Injection technique: Single-shot  Needle Type: Other      Needle Gauge: 20     Additional Needles:   Procedures:,,,,, intact distal pulses, Esmarch exsanguination, single tourniquet utilized,  Narrative:   Performed by: Personally       

## 2019-05-29 NOTE — Op Note (Signed)
PATIENT ID:      Amy [name]  MRN:     [id number] DOB/AGE:    10/22/1965 / 53 y.o.       OPERATIVE REPORT    DATE OF PROCEDURE:  05/29/2019       PREOPERATIVE DIAGNOSIS:   LEFT CARPAL TUNNEL SYNDROME, LEFT RING  TRIGGER FINGER                                                       Estimated body mass index is 30.5 kg/m as calculated from the following:   Height as of this encounter: 5\' 4"  (1.626 m).   Weight as of this encounter: 80.6 kg.     POSTOPERATIVE DIAGNOSIS:   LEFT CARPAL TUNNEL SYNDROME, LEFT RING FINGER TRIGGER FINGER                                                                     Estimated body mass index is 30.5 kg/m as calculated from the following:   Height as of this encounter: 5\' 4"  (1.626 m).   Weight as of this encounter: 80.6 kg.     PROCEDURE:  Procedure(s): LEFT CARPAL TUNNEL RELEASE RELEASE A-1 pulley left ring finger     SURGEON:  Joni Fears, MD    ASSISTANT:   Biagio Borg, PA-C   (Present and scrubbed throughout the case, critical for assistance with exposure, retraction, instrumentation, and closure.)          ANESTHESIA: regional and IV sedation     DRAINS: none :      TOURNIQUET TIME: * Missing tourniquet times found for documented tourniquets in log: [id number] *    COMPLICATIONS:  None   CONDITION:  stable  PROCEDURE IN DETAIL: [place] 05/29/2019, 7:56 AM

## 2019-05-29 NOTE — H&P (Signed)
The recent History & Physical has been reviewed. I have personally examined the patient today. There is no interval change to the documented History & Physical. The patient would like to proceed with the procedure.  Garald Balding 05/29/2019,  7:16 AM

## 2019-05-29 NOTE — Anesthesia Postprocedure Evaluation (Signed)
Anesthesia Post Note  Patient: Amy [name]  Procedure(s) Performed: LEFT CARPAL TUNNEL RELEASE (Left Wrist) RELEASE TRIGGER FINGER/A-1 PULLEY RING FINGER (Left Hand)     Patient location during evaluation: PACU Anesthesia Type: Bier Block Level of consciousness: awake and alert Pain management: pain level controlled Vital Signs Assessment: post-procedure vital signs reviewed and stable Respiratory status: spontaneous breathing, nonlabored ventilation, respiratory function stable and patient connected to nasal cannula oxygen Cardiovascular status: stable and blood pressure returned to baseline Postop Assessment: no apparent nausea or vomiting Anesthetic complications: no    Last Vitals:  Vitals:   05/29/19 0845 05/29/19 0850  BP: 138/79   Pulse: 88 74  Resp: 19 15  Temp:    SpO2: 97% 97%    Last Pain:  Vitals:   05/29/19 0841  TempSrc:   PainSc: 7                  Montez Hageman

## 2019-05-29 NOTE — Discharge Instructions (Signed)

## 2019-05-30 ENCOUNTER — Ambulatory Visit: Payer: Self-pay

## 2019-05-30 ENCOUNTER — Encounter (HOSPITAL_BASED_OUTPATIENT_CLINIC_OR_DEPARTMENT_OTHER): Payer: Self-pay | Admitting: Orthopaedic Surgery

## 2019-05-30 ENCOUNTER — Telehealth: Payer: Self-pay | Admitting: Orthopaedic Surgery

## 2019-05-30 NOTE — Telephone Encounter (Signed)
FYI: Patient called stating she missed Dr. Rudene Anda call. Per patient, she is doing well. She has a little pain, but not a lot. Patient wanted to thank doctor for checking on her, and will see [place] at her follow up. Patient advised to call if anything changes, or if she has any questions.

## 2019-05-30 NOTE — Telephone Encounter (Signed)
Please see below. FYI 

## 2019-05-31 NOTE — Telephone Encounter (Signed)
thanks

## 2019-06-07 ENCOUNTER — Ambulatory Visit (INDEPENDENT_AMBULATORY_CARE_PROVIDER_SITE_OTHER): Payer: Self-pay | Admitting: Orthopaedic Surgery

## 2019-06-07 ENCOUNTER — Other Ambulatory Visit: Payer: Self-pay

## 2019-06-07 ENCOUNTER — Telehealth: Payer: Self-pay

## 2019-06-07 ENCOUNTER — Encounter: Payer: Self-pay | Admitting: Orthopaedic Surgery

## 2019-06-07 VITALS — BP 137/88 | HR 99 | Ht 64.0 in | Wt 177.0 lb

## 2019-06-07 DIAGNOSIS — M65342 Trigger finger, left ring finger: Secondary | ICD-10-CM

## 2019-06-07 DIAGNOSIS — G5603 Carpal tunnel syndrome, bilateral upper limbs: Secondary | ICD-10-CM

## 2019-06-07 NOTE — Progress Notes (Signed)
Office Visit Note   Patient: Amy [name]           Date of Birth: Mar 05, 1966           MRN: [id number] Visit Date: 06/07/2019              Requested by: Bing Neighbors, FNP [address] [place],  [place] [address] PCP: Bing Neighbors, FNP   Assessment & Plan: Visit Diagnoses:  1. Trigger finger, left ring finger   2. Bilateral carpal tunnel syndrome     Plan: 9 days status post left carpal tunnel release and ring finger release.  Doing well.  Removed every other stitch.  Return next week to remove the remaining stitches.  No work  Follow-Up Instructions: Return in about 1 week (around 06/14/2019).   Orders:  No orders of the defined types were placed in this encounter.  No orders of the defined types were placed in this encounter.     Procedures: No procedures performed   Clinical Data: No additional findings.   Subjective: Chief Complaint  Patient presents with  . Left Hand - Routine Post Op    Left carpal tunnel release and trigger release of ring finger DOS 05/29/2019  Patient presents today for follow up on her left hand. She had carpal tunnel release and trigger release of her ring finger on 05/29/2019. She is now 9 days out from surgery. She is taking Ibuprofen for pain. She states that her hand is painful.  No related numbness or tingling  HPI  Review of Systems   Objective: Vital Signs: BP 137/88   Pulse 99   Ht 5\' 4"  (1.626 m)   Wt 177 lb (80.3 kg)   LMP 01/26/2000   BMI 30.38 kg/m   Physical Exam  Ortho Exam left hand dressing was removed.  The incisions appear to be healing without problem.  Neurologically intact.  I removed every other stitch and applied Steri-Strips will see in 1 week  Specialty Comments:  No specialty comments available.  Imaging: No results found.   PMFS History: Patient Active Problem List   Diagnosis Date Noted  . Trigger finger, left ring finger 05/03/2019  . Carpal tunnel syndrome, right upper  limb 04/19/2019  . Bilateral carpal tunnel syndrome 12/28/2018  . Abnormal thyroid function test 03/03/2018  . Chronic obstructive pulmonary disease (HCC) 03/03/2018  . Right hip pain 03/03/2018  . Substance abuse ([facility]CC) 03/03/2018  . Preop examination 02/03/2018  . Abnormal PFT 02/03/2018  . Hypothyroidism 02/03/2018  . Spondylolisthesis, lumbar region 01/18/2017  . Cocaine dependence (HCC) 07/25/2015  . 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/25/2015  . Hx of non anemic vitamin B12 deficiency 11/08/2014  . Brain aneurysm 09/09/2014  . Smoker 07/22/2014  . Rhinitis, allergic 07/22/2014  . Chronic low back pain with sciatica 01/24/2013  . Health care maintenance 10/16/2012  . Hot flashes 02/08/2012  . Post-surgical hypothyroidism 12/07/2011  . Fibromyalgia muscle pain 03/31/2011  . Migraine 03/31/2011  . Essential hypertension, benign 11/14/2008  . DEPRESSION 08/10/2006   Past Medical History:  Diagnosis Date  . Abnormal PFT 01/2018  . Anxiety   . Arthritis   . Brain aneurysm    2-3 mm, stable  . Chronic back pain   . Depression    history of  . Grave's disease 2007   TSH (08/31/2010) = 0.186 (low), free T4 = 0.91 (WNL)  . History of substance abuse ([facility])    remote past  .  Hypertension    cardiology consult 01/2018, Dr. Sharyn Lull  . Postsurgical hypothyroidism   . Raynaud disease 2007  . Smoker     Family History  Problem Relation Age of Onset  . Colon cancer Maternal Grandfather   . Cancer Maternal Grandfather        prostate  . Hypertension Mother   . Thyroid disease Mother   . Lupus Mother   . Hypertension Father     Past Surgical History:  Procedure Laterality Date  . ABDOMINAL HYSTERECTOMY     due to fibroids, still has ovaries  . CARPAL TUNNEL RELEASE Right 04/19/2019   Procedure: RIGHT CARPAL TUNNEL RELEASE;  Surgeon: Valeria Batman, MD;  Location: [facility];  Service: Orthopedics;  Laterality: Right;  . CARPAL TUNNEL RELEASE Left  05/29/2019   Procedure: LEFT CARPAL TUNNEL RELEASE;  Surgeon: Valeria Batman, MD;  Location: [facility];  Service: Orthopedics;  Laterality: Left;  . COLONOSCOPY  02/14/2008   done by Dr. Christella Hartigan - small colonic polyp identified, 6 mm in size and per biopsy --> tubular adenoma with no malignancy or high grade dysplasia noted  . THYROIDECTOMY  03/08/2012   [facility]; history of Graves' disease with multinodular goiter  . TONSILLECTOMY    . TRIGGER FINGER RELEASE Left 05/29/2019   Procedure: RELEASE TRIGGER FINGER/A-1 PULLEY RING FINGER;  Surgeon: Valeria Batman, MD;  Location: [facility];  Service: Orthopedics;  Laterality: Left;  . WISDOM TOOTH EXTRACTION     Social History   Occupational History  . Not on file  Tobacco Use  . Smoking status: Current Every Day Smoker    Packs/day: 0.25    Years: 35.00    Pack years: 8.75    Types: Cigarettes  . Smokeless tobacco: Never Used  Substance and Sexual Activity  . Alcohol use: No    Alcohol/week: 0.0 standard drinks    Comment: sober for several years  . Drug use: Not Currently    Types: Cocaine, Marijuana    Comment: last used 09/2018, pt in recovery  . Sexual activity: Not on file

## 2019-06-07 NOTE — Telephone Encounter (Signed)

## 2019-06-11 ENCOUNTER — Ambulatory Visit (INDEPENDENT_AMBULATORY_CARE_PROVIDER_SITE_OTHER): Payer: Self-pay | Admitting: Family Medicine

## 2019-06-11 ENCOUNTER — Other Ambulatory Visit: Payer: Self-pay

## 2019-06-11 VITALS — BP 152/95 | HR 93 | Temp 97.3°F | Resp 17 | Ht 64.0 in | Wt 180.0 lb

## 2019-06-11 DIAGNOSIS — R1013 Epigastric pain: Secondary | ICD-10-CM

## 2019-06-11 DIAGNOSIS — Z1239 Encounter for other screening for malignant neoplasm of breast: Secondary | ICD-10-CM

## 2019-06-11 DIAGNOSIS — I10 Essential (primary) hypertension: Secondary | ICD-10-CM

## 2019-06-11 MED ORDER — FAMOTIDINE 20 MG PO TABS: 20.0000 mg | ORAL_TABLET | Freq: Two times a day (BID) | ORAL | 11 refills | Status: DC

## 2019-06-11 NOTE — Progress Notes (Signed)
Doesn't check BP at home. Denies chest pain, SHOB, lower extremity swelling  Thinks her thyroid is off b/c she's more irritable & sensitive to heat/cold.

## 2019-06-11 NOTE — Progress Notes (Signed)
Established Patient Office Visit  Subjective:  Patient ID: Amy [name], female    DOB: 02/25/1966  Age: 53 y.o. MRN: [id number]  CC:  Chief Complaint  Patient presents with  . Hypertension    [name] presents for follow-up of hypertension.  Patient was seen in the office on 05/09/2019 by another provider and at which time her blood pressure was elevated at 160/96.  She has been prescribed losartan 100 mg and metoprolol XL 50 mg daily.  Patient reports that another medication, amlodipine 10 mg was also added which she has been taking.  She states that she has been compliant with the medication but believes that her blood pressure still elevated because it is too soon for the new medication to have taken effect.  Since her last visit she has also had surgery by Dr. Cleophas DunkerWhitfield for left carpal tunnel release and release of left trigger finger involving the ring finger.  She reports that she continues to have a significant amount of pain related to her recent carpal tunnel and trigger finger surgery but has an appointment later this week with her hand specialist in order to have the stitches removed.  She denies any headaches or dizziness related to her blood pressure.  She denies any shortness of breath or cough, no chest pain or palpitations.  She has had no fever or chills.  She denies any abdominal pain.  She is currently taking ibuprofen to help with her postsurgical pain.  She has noticed some mild nausea as well as burping and belching.  She denies any blood in the stool and no black stools.  No vomiting, constipation or diarrhea.  She denies any issues with peripheral edema.  She wonders if her thyroid levels might be slightly off as she does not quite feel herself and she wonders if it is too soon to have repeat of her thyroid blood work.  She reports that she is being compliant with daily use of thyroid medication.  Past Medical History:  Diagnosis Date  . Abnormal PFT 01/2018  .  Anxiety   . Arthritis   . Brain aneurysm    2-3 mm, stable  . Chronic back pain   . Depression    history of  . Grave's disease 2007   TSH (08/31/2010) = 0.186 (low), free T4 = 0.91 (WNL)  . History of substance abuse (HCC)    remote past  . Hypertension    cardiology consult 01/2018, Dr. Sharyn LullHarwani  . Postsurgical hypothyroidism   . Raynaud disease 2007  . Smoker     Past Surgical History:  Procedure Laterality Date  . ABDOMINAL HYSTERECTOMY     due to fibroids, still has ovaries  . CARPAL TUNNEL RELEASE Right 04/19/2019   Procedure: RIGHT CARPAL TUNNEL RELEASE;  Surgeon: Valeria BatmanWhitfield, Peter W, MD;  Location: [facility];  Service: Orthopedics;  Laterality: Right;  . CARPAL TUNNEL RELEASE Left 05/29/2019   Procedure: LEFT CARPAL TUNNEL RELEASE;  Surgeon: Valeria BatmanWhitfield, Peter W, MD;  Location: [facility];  Service: Orthopedics;  Laterality: Left;  . COLONOSCOPY  02/14/2008   done by Dr. Christella HartiganJacobs - small colonic polyp identified, 6 mm in size and per biopsy --> tubular adenoma with no malignancy or high grade dysplasia noted  . [facility]  [facility]; history of Graves' disease with multinodular goiter  . TONSILLECTOMY    . TRIGGER FINGER RELEASE Left 05/29/2019   Procedure: RELEASE TRIGGER FINGER/A-1 PULLEY RING  FINGER;  Surgeon: Valeria Batman, MD;  Location: [facility];  Service: Orthopedics;  Laterality: Left;  . WISDOM TOOTH EXTRACTION      Family History  Problem Relation Age of Onset  . Colon cancer Maternal Grandfather   . Cancer Maternal Grandfather        prostate  . Hypertension Mother   . Thyroid disease Mother   . Lupus Mother   . Hypertension Father     Social History   Socioeconomic History  . Marital status: Single    Spouse name: Not on file  . Number of children: Not on file  . Years of education: Not on file  . Highest education level: Not on file  Occupational History  . Not on file  Social  Needs  . Financial resource strain: Not on file  . Food insecurity    Worry: Not on file    Inability: Not on file  . Transportation needs    Medical: Not on file    Non-medical: Not on file  Tobacco Use  . Smoking status: Current Every Day Smoker    Packs/day: 0.25    Years: 35.00    Pack years: 8.75    Types: Cigarettes  . Smokeless tobacco: Never Used  Substance and Sexual Activity  . Alcohol use: No    Alcohol/week: 0.0 standard drinks    Comment: sober for several years  . Drug use: Not Currently    Types: Cocaine, Marijuana    Comment: last used 09/2018, pt in recovery  . Sexual activity: Not on file  Lifestyle  . Physical activity    Days per week: Not on file    Minutes per session: Not on file  . Stress: Not on file  Relationships  . Social Musician on phone: Not on file    Gets together: Not on file    Attends religious service: Not on file    Active member of club or organization: Not on file    Attends meetings of clubs or organizations: Not on file    Relationship status: Not on file  . Intimate partner violence    Fear of current or ex partner: Not on file    Emotionally abused: Not on file    Physically abused: Not on file    Forced sexual activity: Not on file  Other Topics Concern  . Not on file  Social History Narrative   Lives with grandson.   Works as Psychologist, clinical.  01/2018.     Financial assistance approved for 80% discount at [facility] and has [facility] card per Rudell Cobb   07/24/2010       Outpatient Medications Prior to Visit  Medication Sig Dispense Refill  . amitriptyline (ELAVIL) 100 MG tablet Take 1/2 tablet for 7 days, then increase to 100 mg daily. 30 tablet 3  . amLODipine (NORVASC) 10 MG tablet Take 1 tablet (10 mg total) by mouth daily. 90 tablet 3  . ibuprofen (ADVIL) 800 MG tablet Take 1 tablet (800 mg total) by mouth every 8 (eight) hours as needed. 40 tablet 0  . levothyroxine (SYNTHROID) 100 MCG tablet Take 1 tablet  (100 mcg total) by mouth daily. 90 tablet 3  . losartan (COZAAR) 100 MG tablet Take 1 tablet (100 mg total) by mouth daily. 90 tablet 1  . metoprolol succinate (TOPROL-XL) 50 MG 24 hr tablet Take 1 tablet (50 mg total) by mouth daily. 90 tablet 1  .  oxyCODONE (ROXICODONE) 5 MG immediate release tablet Take 1 tablet (5 mg total) by mouth every 6 (six) hours as needed. (Patient not taking: Reported on 06/07/2019) 10 tablet 0   No facility-administered medications prior to visit.     Allergies  Allergen Reactions  . Adhesive [Tape] Other (See Comments)    Pulls skin off  . Morphine And Related Nausea And Vomiting and Other (See Comments)    Does not like feeling  . Vicodin [Hydrocodone-Acetaminophen] Itching    ROS Review of Systems  Constitutional: Positive for fatigue. Negative for chills and fever.  HENT: Negative for sore throat and trouble swallowing.   Respiratory: Negative for cough and shortness of breath.   Cardiovascular: Negative for chest pain, palpitations and leg swelling.  Gastrointestinal: Positive for nausea. Negative for abdominal pain, blood in stool, constipation, diarrhea and vomiting.  Endocrine: Negative for cold intolerance, heat intolerance, polydipsia, polyphagia and polyuria.  Genitourinary: Negative for dysuria and frequency.  Musculoskeletal: Positive for arthralgias. Negative for back pain.  Neurological: Negative for dizziness and headaches.  Hematological: Negative for adenopathy. Does not bruise/bleed easily.      Objective:    Physical Exam  Constitutional: She is oriented to person, place, and time.  Well-nourished well-developed older female in no acute distress.  Patient is wearing an Ace wrap type bandage to the left hand and wrist  Neck: Normal range of motion. Neck supple. No JVD present.  Patient with healed surgical scar with some mild puffiness of the right anterior neck (patient reports prior removal of the thyroid)  Cardiovascular:  Normal rate and regular rhythm.  Pulmonary/Chest: Effort normal and breath sounds normal.  Abdominal: Soft. There is no abdominal tenderness. There is no rebound and no guarding.  Musculoskeletal:     Comments: Patient with bandage to the left hand and wrist status post carpal tunnel surgery.  No lower extremity peripheral edema.  Lymphadenopathy:    She has no cervical adenopathy.  Neurological: She is alert and oriented to person, place, and time.  Skin: Skin is warm and dry.  Psychiatric: She has a normal mood and affect. Her behavior is normal.  Nursing note and vitals reviewed.   BP (!) 152/95   Pulse 93   Temp (!) 97.3 F (36.3 C) (Temporal)   Resp 17   Ht 5\' 4"  (1.626 m)   Wt 180 lb (81.6 kg)   LMP 01/26/2000   SpO2 96%   BMI 30.90 kg/m  Wt Readings from Last 3 Encounters:  06/11/19 180 lb (81.6 kg)  06/07/19 177 lb (80.3 kg)  05/29/19 177 lb 11.1 oz (80.6 kg)     Health Maintenance Due  Topic Date Due  . COLONOSCOPY  02/13/2013  . MAMMOGRAM  09/03/2017  . INFLUENZA VACCINE  04/21/2019    Lab Results  Component Value Date   TSH 2.430 05/09/2019   Lab Results  Component Value Date   WBC 5.3 01/08/2019   HGB 13.4 01/08/2019   HCT 41.8 01/08/2019   MCV 94.8 01/08/2019   PLT 191 01/08/2019   Lab Results  Component Value Date   NA 141 05/09/2019   K 4.1 05/09/2019   CO2 22 05/09/2019   GLUCOSE 65 05/09/2019   BUN 13 05/09/2019   CREATININE 0.78 05/09/2019   BILITOT 0.6 11/29/2018   ALKPHOS 109 11/29/2018   AST 19 11/29/2018   ALT 15 11/29/2018   PROT 6.9 11/29/2018   ALBUMIN 4.3 11/29/2018   CALCIUM 9.3 05/09/2019   ANIONGAP  7 01/08/2019   Lab Results  Component Value Date   CHOL 174 07/02/2013   Lab Results  Component Value Date   HDL 55 07/02/2013   Lab Results  Component Value Date   LDLCALC 104 (H) 07/02/2013   Lab Results  Component Value Date   TRIG 75 07/02/2013   Lab Results  Component Value Date   CHOLHDL 3.2 07/02/2013    No results found for: HGBA1C    Assessment & Plan:  1. Essential hypertension Patient's blood pressure still elevated at today's visit but she reports that she is compliant with amlodipine, losartan and hydrochlorothiazide.  Patient is currently unemployed and out of work secondary to recent carpal tunnel surgery and patient wonders if there is a way that she can obtain her blood pressure medications at a cheaper price.  Referral will be placed with social worker to see if patient is eligible to use [facility] and if patient has qualified for any discount for medications as patient states that she does have an [employer] card.  Patient is encouraged to continue her current medications, follow a low-sodium diet and return in approximately 2 weeks for nurse visit blood pressure recheck. Patient's use of ibuprofen status post carpal tunnel surgery may be contributing to her elevated blood pressure. - Ambulatory referral to Social Work  2. Dyspepsia Patient with complaint of nausea and some occasional upper abdominal discomfort along with belching and burping.  She is currently on ibuprofen status post carpal tunnel surgery and discussed with the patient that she likely has some dyspepsia or gastritis as a cause of her symptoms.  Prescription has been sent to patient's pharmacy for Pepcid 200 mg twice daily to help with her symptoms but patient should call/notify the office if this medication is on back order or if she is unable to obtain this medication so that a different medication can be substituted.  Also discussed eating prior to taking ibuprofen.  Patient should also call and notify the office if she has upper abdominal pain but seek medical attention if she has black stool, blood in the stools or severe abdominal pain - famotidine (PEPCID) 20 MG tablet; Take 1 tablet (20 mg total) by mouth 2 (two) times daily. To reduce stomach acid  Dispense: 60 tablet; Refill: 11  3. Screening for breast  cancer Patient was provided with information on applying for scholarship to have mammogram done  An After Visit Summary was printed and given to the patient.  Follow-up: Return in about 6 weeks (around 07/23/2019) for HTN- 2 week nurse visit; 6 week office.    Antony Blackbird, MD

## 2019-06-11 NOTE — Patient Instructions (Signed)
  Hypertension, Adult Hypertension is another name for high blood pressure. High blood pressure forces your heart to work harder to pump blood. This can cause problems over time. There are two numbers in a blood pressure reading. There is a top number (systolic) over a bottom number (diastolic). It is best to have a blood pressure that is below 120/80. Healthy choices can help lower your blood pressure, or you may need medicine to help lower it. What are the causes? The cause of this condition is not known. Some conditions may be related to high blood pressure. What increases the risk?  Smoking.  Having type 2 diabetes mellitus, high cholesterol, or both.  Not getting enough exercise or physical activity.  Being overweight.  Having too much fat, sugar, calories, or salt (sodium) in your diet.  Drinking too much alcohol.  Having long-term (chronic) kidney disease.  Having a family history of high blood pressure.  Age. Risk increases with age.  Race. You may be at higher risk if you are African American.  Gender. Men are at higher risk than women before age 45. After age 65, women are at higher risk than men.  Having obstructive sleep apnea.  Stress. What are the signs or symptoms?  High blood pressure may not cause symptoms. Very high blood pressure (hypertensive crisis) may cause: ? Headache. ? Feelings of worry or nervousness (anxiety). ? Shortness of breath. ? Nosebleed. ? A feeling of being sick to your stomach (nausea). ? Throwing up (vomiting). ? Changes in how you see. ? Very bad chest pain. ? Seizures. How is this treated?  This condition is treated by making healthy lifestyle changes, such as: ? Eating healthy foods. ? Exercising more. ? Drinking less alcohol.  Your health care provider may prescribe medicine if lifestyle changes are not enough to get your blood pressure under control, and if: ? Your top number is above 130. ? Your bottom number is  above 80.  Your personal target blood pressure may vary. Follow these instructions at home: Eating and drinking   If told, follow the DASH eating plan. To follow this plan: ? Fill one half of your plate at each meal with fruits and vegetables. ? Fill one fourth of your plate at each meal with whole grains. Whole grains include whole-wheat pasta, brown rice, and whole-grain bread. ? Eat or drink low-fat dairy products, such as skim milk or low-fat yogurt. ? Fill one fourth of your plate at each meal with low-fat (lean) proteins. Low-fat proteins include fish, chicken without skin, eggs, beans, and tofu. ? Avoid fatty meat, cured and processed meat, or chicken with skin. ? Avoid pre-made or processed food.  Eat less than 1,500 mg of salt each day.  Do not drink alcohol if: ? Your doctor tells you not to drink. ? You are pregnant, may be pregnant, or are planning to become pregnant.  If you drink alcohol: ? Limit how much you use to:  0-1 drink a day for women.  0-2 drinks a day for men. ? Be aware of how much alcohol is in your drink. In the [place] one drink equals one 12 oz bottle of beer (355 mL), one 5 oz glass of wine (148 mL), or one 1 oz glass of hard liquor (44 mL). Lifestyle   Work with your doctor to stay at a healthy weight or to lose weight. Ask your doctor what the best weight is for you.  Get at least 30 minutes of   exercise most days of the week. This may include walking, swimming, or biking.  Get at least 30 minutes of exercise that strengthens your muscles (resistance exercise) at least 3 days a week. This may include lifting weights or doing Pilates.  Do not use any products that contain nicotine or tobacco, such as cigarettes, e-cigarettes, and chewing tobacco. If you need help quitting, ask your doctor.  Check your blood pressure at home as told by your doctor.  Keep all follow-up visits as told by your doctor. This is important. Medicines  Take  over-the-counter and prescription medicines only as told by your doctor. Follow directions carefully.  Do not skip doses of blood pressure medicine. The medicine does not work as well if you skip doses. Skipping doses also puts you at risk for problems.  Ask your doctor about side effects or reactions to medicines that you should watch for. Contact a doctor if you:  Think you are having a reaction to the medicine you are taking.  Have headaches that keep coming back (recurring).  Feel dizzy.  Have swelling in your ankles.  Have trouble with your vision. Get help right away if you:  Get a very bad headache.  Start to feel mixed up (confused).  Feel weak or numb.  Feel faint.  Have very bad pain in your: ? Chest. ? Belly (abdomen).  Throw up more than once.  Have trouble breathing. Summary  Hypertension is another name for high blood pressure.  High blood pressure forces your heart to work harder to pump blood.  For most people, a normal blood pressure is less than 120/80.  Making healthy choices can help lower blood pressure. If your blood pressure does not get lower with healthy choices, you may need to take medicine. This information is not intended to replace advice given to you by your health care provider. Make sure you discuss any questions you have with your health care provider. Document Released: 02/23/2008 Document Revised: 05/17/2018 Document Reviewed: 05/17/2018 Elsevier Patient Education  2020 [employer] DASH Eating Plan DASH stands for "Dietary Approaches to Stop Hypertension." The DASH eating plan is a healthy eating plan that has been shown to reduce high blood pressure (hypertension). It may also reduce your risk for type 2 diabetes, heart disease, and stroke. The DASH eating plan may also help with weight loss. What are tips for following this plan?  General guidelines  Avoid eating more than 2,300 mg (milligrams) of salt (sodium) a day. If you  have hypertension, you may need to reduce your sodium intake to 1,500 mg a day.  Limit alcohol intake to no more than 1 drink a day for nonpregnant women and 2 drinks a day for men. One drink equals 12 oz of beer, 5 oz of wine, or 1 oz of hard liquor.  Work with your health care provider to maintain a healthy body weight or to lose weight. Ask what an ideal weight is for you.  Get at least 30 minutes of exercise that causes your heart to beat faster (aerobic exercise) most days of the week. Activities may include walking, swimming, or biking.  Work with your health care provider or diet and nutrition specialist (dietitian) to adjust your eating plan to your individual calorie needs. Reading food labels   Check food labels for the amount of sodium per serving. Choose foods with less than 5 percent of the Daily Value of sodium. Generally, foods with less than 300 mg of sodium per serving   fit into this eating plan.  To find whole grains, look for the word "whole" as the first word in the ingredient list. Shopping  Buy products labeled as "low-sodium" or "no salt added."  Buy fresh foods. Avoid canned foods and premade or frozen meals. Cooking  Avoid adding salt when cooking. Use salt-free seasonings or herbs instead of table salt or sea salt. Check with your health care provider or pharmacist before using salt substitutes.  Do not fry foods. Cook foods using healthy methods such as baking, boiling, grilling, and broiling instead.  Cook with heart-healthy oils, such as olive, canola, soybean, or sunflower oil. Meal planning  Eat a balanced diet that includes: ? 5 or more servings of fruits and vegetables each day. At each meal, try to fill half of your plate with fruits and vegetables. ? Up to 6-8 servings of whole grains each day. ? Less than 6 oz of lean meat, poultry, or fish each day. A 3-oz serving of meat is about the same size as a deck of cards. One egg equals 1 oz. ? 2 servings  of low-fat dairy each day. ? A serving of nuts, seeds, or beans 5 times each week. ? Heart-healthy fats. Healthy fats called Omega-3 fatty acids are found in foods such as flaxseeds and coldwater fish, like sardines, salmon, and mackerel.  Limit how much you eat of the following: ? Canned or prepackaged foods. ? Food that is high in trans fat, such as fried foods. ? Food that is high in saturated fat, such as fatty meat. ? Sweets, desserts, sugary drinks, and other foods with added sugar. ? Full-fat dairy products.  Do not salt foods before eating.  Try to eat at least 2 vegetarian meals each week.  Eat more home-cooked food and less restaurant, buffet, and fast food.  When eating at a restaurant, ask that your food be prepared with less salt or no salt, if possible. What foods are recommended? The items listed may not be a complete list. Talk with your dietitian about what dietary choices are best for you. Grains Whole-grain or whole-wheat bread. Whole-grain or whole-wheat pasta. Brown rice. Oatmeal. Quinoa. Bulgur. Whole-grain and low-sodium cereals. Pita bread. Low-fat, low-sodium crackers. Whole-wheat flour tortillas. Vegetables Fresh or frozen vegetables (raw, steamed, roasted, or grilled). Low-sodium or reduced-sodium tomato and vegetable juice. Low-sodium or reduced-sodium tomato sauce and tomato paste. Low-sodium or reduced-sodium canned vegetables. Fruits All fresh, dried, or frozen fruit. Canned fruit in natural juice (without added sugar). Meat and other protein foods Skinless chicken or turkey. Ground chicken or turkey. Pork with fat trimmed off. Fish and seafood. Egg whites. Dried beans, peas, or lentils. Unsalted nuts, nut butters, and seeds. Unsalted canned beans. Lean cuts of beef with fat trimmed off. Low-sodium, lean deli meat. Dairy Low-fat (1%) or fat-free (skim) milk. Fat-free, low-fat, or reduced-fat cheeses. Nonfat, low-sodium ricotta or cottage cheese. Low-fat or  nonfat yogurt. Low-fat, low-sodium cheese. Fats and oils Soft margarine without trans fats. Vegetable oil. Low-fat, reduced-fat, or light mayonnaise and salad dressings (reduced-sodium). Canola, safflower, olive, soybean, and sunflower oils. Avocado. Seasoning and other foods Herbs. Spices. Seasoning mixes without salt. Unsalted popcorn and pretzels. Fat-free sweets. What foods are not recommended? The items listed may not be a complete list. Talk with your dietitian about what dietary choices are best for you. Grains Baked goods made with fat, such as croissants, muffins, or some breads. Dry pasta or rice meal packs. Vegetables Creamed or fried vegetables. Vegetables in a   cheese sauce. Regular canned vegetables (not low-sodium or reduced-sodium). Regular canned tomato sauce and paste (not low-sodium or reduced-sodium). Regular tomato and vegetable juice (not low-sodium or reduced-sodium). Pickles. Olives. Fruits Canned fruit in a light or heavy syrup. Fried fruit. Fruit in cream or butter sauce. Meat and other protein foods Fatty cuts of meat. Ribs. Fried meat. Bacon. Sausage. Bologna and other processed lunch meats. Salami. Fatback. Hotdogs. Bratwurst. Salted nuts and seeds. Canned beans with added salt. Canned or smoked fish. Whole eggs or egg yolks. Chicken or turkey with skin. Dairy Whole or 2% milk, cream, and half-and-half. Whole or full-fat cream cheese. Whole-fat or sweetened yogurt. Full-fat cheese. Nondairy creamers. Whipped toppings. Processed cheese and cheese spreads. Fats and oils Butter. Stick margarine. Lard. Shortening. Ghee. Bacon fat. Tropical oils, such as coconut, palm kernel, or palm oil. Seasoning and other foods Salted popcorn and pretzels. Onion salt, garlic salt, seasoned salt, table salt, and sea salt. Worcestershire sauce. Tartar sauce. Barbecue sauce. Teriyaki sauce. Soy sauce, including reduced-sodium. Steak sauce. Canned and packaged gravies. Fish sauce. Oyster  sauce. Cocktail sauce. Horseradish that you find on the shelf. Ketchup. Mustard. Meat flavorings and tenderizers. Bouillon cubes. Hot sauce and Tabasco sauce. Premade or packaged marinades. Premade or packaged taco seasonings. Relishes. Regular salad dressings. Where to find more information:  National Heart, Lung, and Blood Institute: www.nhlbi.nih.gov  American Heart Association: www.heart.org Summary  The DASH eating plan is a healthy eating plan that has been shown to reduce high blood pressure (hypertension). It may also reduce your risk for type 2 diabetes, heart disease, and stroke.  With the DASH eating plan, you should limit salt (sodium) intake to 2,300 mg a day. If you have hypertension, you may need to reduce your sodium intake to 1,500 mg a day.  When on the DASH eating plan, aim to eat more fresh fruits and vegetables, whole grains, lean proteins, low-fat dairy, and heart-healthy fats.  Work with your health care provider or diet and nutrition specialist (dietitian) to adjust your eating plan to your individual calorie needs. This information is not intended to replace advice given to you by your health care provider. Make sure you discuss any questions you have with your health care provider. Document Released: 08/26/2011 Document Revised: 08/19/2017 Document Reviewed: 08/30/2016 Elsevier Patient Education  2020 Elsevier Inc.  

## 2019-06-12 ENCOUNTER — Other Ambulatory Visit: Payer: Self-pay | Admitting: Family Medicine

## 2019-06-12 DIAGNOSIS — R921 Mammographic calcification found on diagnostic imaging of breast: Secondary | ICD-10-CM

## 2019-06-12 DIAGNOSIS — N644 Mastodynia: Secondary | ICD-10-CM

## 2019-06-14 ENCOUNTER — Telehealth (HOSPITAL_COMMUNITY): Payer: Self-pay

## 2019-06-14 ENCOUNTER — Ambulatory Visit (INDEPENDENT_AMBULATORY_CARE_PROVIDER_SITE_OTHER): Payer: Self-pay | Admitting: Orthopaedic Surgery

## 2019-06-14 ENCOUNTER — Other Ambulatory Visit: Payer: Self-pay

## 2019-06-14 ENCOUNTER — Encounter: Payer: Self-pay | Admitting: Orthopaedic Surgery

## 2019-06-14 DIAGNOSIS — G5603 Carpal tunnel syndrome, bilateral upper limbs: Secondary | ICD-10-CM

## 2019-06-14 NOTE — Progress Notes (Signed)
Office Visit Note   Patient: Amy [name]           Date of Birth: 1965-11-23           MRN: [id number] Visit Date: 06/14/2019              Requested by: Scot Jun, [address],  [place] [address] PCP: Scot Jun, FNP   Assessment & Plan: Visit Diagnoses:  1. Bilateral carpal tunnel syndrome     Plan: 2 weeks status post left carpal tunnel release and doing well.  Remaining stitches removed from both the carpal tunnel incision and the ring for trigger finger release.  There is a little opening in the palm from the trigger finger but I will applied Steri-Strips over benzoin.  Will reevaluate in 2 weeks.  Patient was not working prior to the surgery and therefore does not need a note  Follow-Up Instructions: Return in about 2 weeks (around 06/28/2019).   Orders:  No orders of the defined types were placed in this encounter.  No orders of the defined types were placed in this encounter.     Procedures: No procedures performed   Clinical Data: No additional findings.   Subjective: Chief Complaint  Patient presents with  . Left Hand - Routine Post Op    Left carpal tunnel release and trigger ring finger release DOS 05/29/2019  Patient presents today for a follow up on her left hand. She had a left carpal tunnel release and trigger ring finger on 05/29/2019. She is now 2 weeks out from surgery. Patient states that her incision is painful, but not having the pain that she was having before the surgery.  HPI  Review of Systems   Objective: Vital Signs: LMP 01/26/2000   Physical Exam  Ortho Exam remaining stitches removed from both the carpal tunnel and trigger finger incisions left palm.  There is a little separation of the trigger finger release and I will apply Steri-Strips over benzoin.  Able to make a full fist and release.  No longer has triggering or the numbness into the radial 3 digits.  Specialty Comments:  No specialty  comments available.  Imaging: No results found.   PMFS History: Patient Active Problem List   Diagnosis Date Noted  . Trigger finger, left ring finger 05/03/2019  . Carpal tunnel syndrome, right upper limb 04/19/2019  . Bilateral carpal tunnel syndrome 12/28/2018  . Abnormal thyroid function test 03/03/2018  . Chronic obstructive pulmonary disease ([place]) 03/03/2018  . Right hip pain 03/03/2018  . Substance abuse ([place]) 03/03/2018  . Preop examination 02/03/2018  . Abnormal PFT 02/03/2018  . Hypothyroidism 02/03/2018  . Spondylolisthesis, lumbar region 01/18/2017  . Cocaine dependence ([place]) 07/25/2015  . Cocaine-induced mood disorder ([place]) 07/25/2015  . Hx of non anemic vitamin B12 deficiency 11/08/2014  . Brain aneurysm 09/09/2014  . Smoker 07/22/2014  . Rhinitis, allergic 07/22/2014  . Chronic low back pain with sciatica 01/24/2013  . Health care maintenance 10/16/2012  . Hot flashes 02/08/2012  . Post-surgical hypothyroidism 12/07/2011  . Fibromyalgia muscle pain 03/31/2011  . Migraine 03/31/2011  . Essential hypertension, benign 11/14/2008  . DEPRESSION 08/10/2006   Past Medical History:  Diagnosis Date  . Abnormal PFT 01/2018  . Anxiety   . Arthritis   . Brain aneurysm    2-3 mm, stable  . Chronic back pain   . Depression    history of  . Grave's disease 2007  TSH (08/31/2010) = 0.186 (low), free T4 = 0.91 (WNL)  . History of substance abuse ([facility])    remote past  . Hypertension    cardiology consult 01/2018, Dr. Sharyn Lull  . Postsurgical hypothyroidism   . Raynaud disease 2007  . Smoker     Family History  Problem Relation Age of Onset  . Colon cancer Maternal Grandfather   . Cancer Maternal Grandfather        prostate  . Hypertension Mother   . Thyroid disease Mother   . Lupus Mother   . Hypertension Father     Past Surgical History:  Procedure Laterality Date  . ABDOMINAL HYSTERECTOMY     due to fibroids, still has ovaries  . CARPAL TUNNEL  RELEASE Right 04/19/2019   Procedure: RIGHT CARPAL TUNNEL RELEASE;  Surgeon: Valeria Batman, MD;  Location: [facility];  Service: Orthopedics;  Laterality: Right;  . CARPAL TUNNEL RELEASE Left 05/29/2019   Procedure: LEFT CARPAL TUNNEL RELEASE;  Surgeon: Valeria Batman, MD;  Location: [facility];  Service: Orthopedics;  Laterality: Left;  . COLONOSCOPY  02/14/2008   done by Dr. Christella Hartigan - small colonic polyp identified, 6 mm in size and per biopsy --> tubular adenoma with no malignancy or high grade dysplasia noted  . THYROIDECTOMY  03/08/2012   [facility]; history of Graves' disease with multinodular goiter  . TONSILLECTOMY    . TRIGGER FINGER RELEASE Left 05/29/2019   Procedure: RELEASE TRIGGER FINGER/A-1 PULLEY RING FINGER;  Surgeon: Valeria Batman, MD;  Location: [facility];  Service: Orthopedics;  Laterality: Left;  . WISDOM TOOTH EXTRACTION     Social History   Occupational History  . Not on file  Tobacco Use  . Smoking status: Current Every Day Smoker    Packs/day: 0.25    Years: 35.00    Pack years: 8.75    Types: Cigarettes  . Smokeless tobacco: Never Used  Substance and Sexual Activity  . Alcohol use: No    Alcohol/week: 0.0 standard drinks    Comment: sober for several years  . Drug use: Not Currently    Types: Cocaine, Marijuana    Comment: last used 09/2018, pt in recovery  . Sexual activity: Not on file

## 2019-06-14 NOTE — Telephone Encounter (Signed)
Telephoned patient at home number. Left voice message to call and schedule with [facility]. 

## 2019-06-20 ENCOUNTER — Telehealth: Payer: Self-pay | Admitting: Licensed Clinical Social Worker

## 2019-06-20 NOTE — Telephone Encounter (Signed)
Call placed to patient to follow up on [facility] referral. LCSW introduced self and explained role at [facility]. Pt was informed of how to obtain a blue card to assist with medication costs. Pt verbalized understanding stating that she would pick up financial counseling application this week from [facility]. No additional concerns noted.

## 2019-06-28 ENCOUNTER — Ambulatory Visit (INDEPENDENT_AMBULATORY_CARE_PROVIDER_SITE_OTHER): Payer: Self-pay | Admitting: Orthopaedic Surgery

## 2019-06-28 ENCOUNTER — Encounter: Payer: Self-pay | Admitting: Orthopaedic Surgery

## 2019-06-28 ENCOUNTER — Other Ambulatory Visit: Payer: Self-pay

## 2019-06-28 VITALS — BP 138/93 | HR 85 | Ht 64.0 in | Wt 180.0 lb

## 2019-06-28 DIAGNOSIS — G5603 Carpal tunnel syndrome, bilateral upper limbs: Secondary | ICD-10-CM

## 2019-06-28 MED ORDER — IBUPROFEN 800 MG PO TABS: 800.0000 mg | ORAL_TABLET | Freq: Three times a day (TID) | ORAL | 1 refills | Status: DC | PRN

## 2019-06-28 NOTE — Progress Notes (Signed)
Office Visit Note   Patient: Amy [name]           Date of Birth: 1965/12/31           MRN: [id number] Visit Date: 06/28/2019              Requested by: Scot Jun, [address],  [place] [address] PCP: Scot Jun, FNP   Assessment & Plan: Visit Diagnoses:  1. Bilateral carpal tunnel syndrome     Plan: 1 month status post left carpal tunnel release and release of ring finger triggering doing well.  Working in patient care.  Full range of motion.  Still having a little soreness in the area of the incision just uses ibuprofen.  We will continue with range of motion and strengthening and using med derma or vitamin E on the incisions and return in 1 month.  Definitely improved from the preoperative status.  Also status post right carpal tunnel release and doing well from that stand point as well  Follow-Up Instructions: Return in about 1 month (around 07/29/2019).   Orders:  No orders of the defined types were placed in this encounter.  Meds ordered this encounter  Medications  . ibuprofen (ADVIL) 800 MG tablet    Sig: Take 1 tablet (800 mg total) by mouth every 8 (eight) hours as needed.    Dispense:  90 tablet    Refill:  1    Order Specific Question:   Supervising Provider    Answer:   Garald Balding [[id number]]      Procedures: No procedures performed   Clinical Data: No additional findings.   Subjective: Chief Complaint  Patient presents with  . Left Hand - Routine Post Op    Left carpal tunnel release and trigger ring finger release DOS 05/29/2019  Patient presents today for a two week follow up on her left hand. She had carpal tunnel release and trigger ring finger release on 05/29/2019. She is now one month out from surgery. She said that both hands are painful. She still has some numbness in her thumb and index finger on the left side. She is taking Ibuprofen for pain.  Much better than she was preoperatively from both the right  and left carpal tunnel releases.  No longer have any catching of the trigger finger at her pain is in the area of the carpal tunnel incisions  HPI  Review of Systems   Objective: Vital Signs: BP (!) 138/93   Pulse 85   Ht 5\' 4"  (1.626 m)   Wt 180 lb (81.6 kg)   LMP 01/26/2000   BMI 30.90 kg/m   Physical Exam  Ortho Exam both right and left carpal tunnel incisions are healing nicely.  There is old but more induration and thickness about on the left than the right which is to be expected.  The trigger finger incision to the left ring is also healing without problem.  Motor exam intact.  Able to make a full fist.  Specialty Comments:  No specialty comments available.  Imaging: No results found.   PMFS History: Patient Active Problem List   Diagnosis Date Noted  . Trigger finger, left ring finger 05/03/2019  . Carpal tunnel syndrome, right upper limb 04/19/2019  . Bilateral carpal tunnel syndrome 12/28/2018  . Abnormal thyroid function test 03/03/2018  . Chronic obstructive pulmonary disease ([facility]) 03/03/2018  . Right hip pain 03/03/2018  . Substance abuse ([facility]) 03/03/2018  .  Preop examination 02/03/2018  . Abnormal PFT 02/03/2018  . Hypothyroidism 02/03/2018  . Spondylolisthesis, lumbar region 01/18/2017  . Cocaine dependence (HCC) 07/25/2015  . 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/25/2015  . Hx of non anemic vitamin B12 deficiency 11/08/2014  . Brain aneurysm 09/09/2014  . Smoker 07/22/2014  . Rhinitis, allergic 07/22/2014  . Chronic low back pain with sciatica 01/24/2013  . Health care maintenance 10/16/2012  . Hot flashes 02/08/2012  . Post-surgical hypothyroidism 12/07/2011  . Fibromyalgia muscle pain 03/31/2011  . Migraine 03/31/2011  . Essential hypertension, benign 11/14/2008  . DEPRESSION 08/10/2006   Past Medical History:  Diagnosis Date  . Abnormal PFT 01/2018  . Anxiety   . Arthritis   . Brain aneurysm    2-3 mm, stable  . Chronic back pain   .  Depression    history of  . Grave's disease 2007   TSH (08/31/2010) = 0.186 (low), free T4 = 0.91 (WNL)  . History of substance abuse (HCC)    remote past  . Hypertension    cardiology consult 01/2018, Dr. Sharyn Lull  . Postsurgical hypothyroidism   . Raynaud disease 2007  . Smoker     Family History  Problem Relation Age of Onset  . Colon cancer Maternal Grandfather   . Cancer Maternal Grandfather        prostate  . Hypertension Mother   . Thyroid disease Mother   . Lupus Mother   . Hypertension Father     Past Surgical History:  Procedure Laterality Date  . ABDOMINAL HYSTERECTOMY     due to fibroids, still has ovaries  . CARPAL TUNNEL RELEASE Right 04/19/2019   Procedure: RIGHT CARPAL TUNNEL RELEASE;  Surgeon: Valeria Batman, MD;  Location: [facility];  Service: Orthopedics;  Laterality: Right;  . CARPAL TUNNEL RELEASE Left 05/29/2019   Procedure: LEFT CARPAL TUNNEL RELEASE;  Surgeon: Valeria Batman, MD;  Location: [facility];  Service: Orthopedics;  Laterality: Left;  . COLONOSCOPY  02/14/2008   done by Dr. Christella Hartigan - small colonic polyp identified, 6 mm in size and per biopsy --> tubular adenoma with no malignancy or high grade dysplasia noted  . THYROIDECTOMY  03/08/2012   [facility]; history of Graves' disease with multinodular goiter  . TONSILLECTOMY    . TRIGGER FINGER RELEASE Left 05/29/2019   Procedure: RELEASE TRIGGER FINGER/A-1 PULLEY RING FINGER;  Surgeon: Valeria Batman, MD;  Location: [facility];  Service: Orthopedics;  Laterality: Left;  . WISDOM TOOTH EXTRACTION     Social History   Occupational History  . Not on file  Tobacco Use  . Smoking status: Current Every Day Smoker    Packs/day: 0.25    Years: 35.00    Pack years: 8.75    Types: Cigarettes  . Smokeless tobacco: Never Used  Substance and Sexual Activity  . Alcohol use: No    Alcohol/week: 0.0 standard drinks    Comment: sober for several  years  . Drug use: Not Currently    Types: Cocaine, Marijuana    Comment: last used 09/2018, pt in recovery  . Sexual activity: Not on file

## 2019-07-04 ENCOUNTER — Other Ambulatory Visit: Payer: Self-pay

## 2019-07-04 ENCOUNTER — Emergency Department (HOSPITAL_COMMUNITY)
Admission: EM | Admit: 2019-07-04 | Discharge: 2019-07-04 | Discharge status: Against Medical Advice | Payer: Self-pay | Attending: Emergency Medicine | Admitting: Emergency Medicine

## 2019-07-04 ENCOUNTER — Encounter (HOSPITAL_COMMUNITY): Payer: Self-pay | Admitting: Emergency Medicine

## 2019-07-04 DIAGNOSIS — Z5321 Procedure and treatment not carried out due to patient leaving prior to being seen by health care provider: Secondary | ICD-10-CM | POA: Insufficient documentation

## 2019-07-04 LAB — CBC
HCT: 38.8 % (ref 36.0–46.0)
Hemoglobin: 13 g/dL (ref 12.0–15.0)
MCH: 30.7 pg (ref 26.0–34.0)
MCHC: 33.5 g/dL (ref 30.0–36.0)
MCV: 91.5 fL (ref 80.0–100.0)
Platelets: 240 10*3/uL (ref 150–400)
RBC: 4.24 MIL/uL (ref 3.87–5.11)
RDW: 12.1 % (ref 11.5–15.5)
WBC: 8.1 10*3/uL (ref 4.0–10.5)
nRBC: 0 % (ref 0.0–0.2)

## 2019-07-04 LAB — BASIC METABOLIC PANEL
Anion gap: 12 (ref 5–15)
BUN: 6 mg/dL (ref 6–20)
CO2: 24 mmol/L (ref 22–32)
Calcium: 9.4 mg/dL (ref 8.9–10.3)
Chloride: 105 mmol/L (ref 98–111)
Creatinine, Ser: 0.66 mg/dL (ref 0.44–1.00)
GFR calc Af Amer: >60 mL/min (ref >60)
GFR calc non Af Amer: >60 mL/min (ref >60)
Glucose, Bld: 88 mg/dL (ref 70–99)
Potassium: 3.5 mmol/L (ref 3.5–5.1)
Sodium: 141 mmol/L (ref 135–145)

## 2019-07-04 LAB — I-STAT BETA HCG BLOOD, ED (MC, WL, AP ONLY): I-stat hCG, quantitative: <5 m[IU]/mL (ref <5)

## 2019-07-04 MED ORDER — SODIUM CHLORIDE 0.9% FLUSH: 3.0000 mL | Freq: Once | INTRAVENOUS | Status: DC

## 2019-07-04 NOTE — ED Notes (Signed)
Pt did not respond for vitals 

## 2019-07-04 NOTE — ED Triage Notes (Signed)
Pt reports hx of htn, on 3 meds, states she has headache and feels like bp Is high, readings in the 160s at home. Pt also endorses dizziness, a/ox4, resp e/u, nad.

## 2019-07-05 ENCOUNTER — Other Ambulatory Visit: Payer: Self-pay | Admitting: Family Medicine

## 2019-07-05 ENCOUNTER — Telehealth: Payer: Self-pay | Admitting: Family Medicine

## 2019-07-05 NOTE — Telephone Encounter (Signed)
This has already been sent to the covering provider she saw. Waiting on them to approve or deny.

## 2019-07-05 NOTE — Telephone Encounter (Signed)
1) Medication(s) Requested (by name): amitriptyline (ELAVIL) 100 MG tablet [[id number]]   2) Pharmacy of Choice:  [facility] [place] - [address]   Approved medications will be sent to pharmacy, we will reach out to you if there is an issue.  Requests made after 3pm may not be addressed until following business day!

## 2019-07-05 NOTE — Telephone Encounter (Signed)
Requested medication (s) are due for refill today: yes  Requested medication (s) are on the active medication list: yes  Last refill: 05/15/2019  Future visit scheduled: no  Notes to clinic:  Last filled by Molli Barrows   Requested Prescriptions  Pending Prescriptions Disp Refills   amitriptyline (ELAVIL) 100 MG tablet [Pharmacy Med Name: Amitriptyline HCl 100 MG Oral Tablet] 30 tablet 0    Sig: TAKE 1/2 (ONE-HALF) TABLET BY MOUTH ONCE DAILY FOR 7 DAYS, THEN INCREASE TO 100 MG ( 1 TABLET) DAILY     There is no refill protocol information for this order

## 2019-07-31 ENCOUNTER — Ambulatory Visit (INDEPENDENT_AMBULATORY_CARE_PROVIDER_SITE_OTHER): Payer: Self-pay | Admitting: Orthopaedic Surgery

## 2019-07-31 ENCOUNTER — Encounter: Payer: Self-pay | Admitting: Orthopaedic Surgery

## 2019-07-31 ENCOUNTER — Other Ambulatory Visit: Payer: Self-pay

## 2019-07-31 VITALS — BP 129/80 | HR 89 | Ht 64.0 in | Wt 180.0 lb

## 2019-07-31 DIAGNOSIS — M65342 Trigger finger, left ring finger: Secondary | ICD-10-CM

## 2019-07-31 DIAGNOSIS — G5603 Carpal tunnel syndrome, bilateral upper limbs: Secondary | ICD-10-CM

## 2019-07-31 NOTE — Progress Notes (Signed)
Office Visit Note   Patient: Amy [name]           Date of Birth: 1966-01-14           MRN: [id number] Visit Date: 07/31/2019              Requested by: Bing Neighbors, FNP [address] [place],  [place] [address] PCP: Bing Neighbors, FNP   Assessment & Plan: Visit Diagnoses:  1. Bilateral carpal tunnel syndrome   2. Trigger finger, left ring finger     Plan: #1: Continue massage of the scars #2: Call in the interim if she has any problems otherwise follow back up as needed   Follow-Up Instructions: Return if symptoms worsen or fail to improve.   Orders:  No orders of the defined types were placed in this encounter.  No orders of the defined types were placed in this encounter.     Procedures: No procedures performed   Clinical Data: No additional findings.   Subjective: Chief Complaint  Patient presents with  . Left Hand - Routine Post Op    Left carpal tunnel release and trigger ring finger release DOS 05/29/2019   HPI: Patient presents today for follow up on her left hand. She had surgery for left carpal tunnel release and ring trigger finger release on 05/29/2019. She is now two months out from surgery. Patient states that she is doing well overall, but she has some stabbing sensations in her wrist on and off. She takes Ibuprofen as needed.    Review of Systems  Constitutional: Negative for fatigue.  HENT: Negative for ear pain.   Eyes: Negative for pain.  Respiratory: Negative for shortness of breath.   Cardiovascular: Negative for leg swelling.  Gastrointestinal: Negative for constipation and diarrhea.  Endocrine: Positive for cold intolerance. Negative for heat intolerance.  Genitourinary: Negative for difficulty urinating.  Musculoskeletal: Negative for joint swelling.  Skin: Negative for rash.  Allergic/Immunologic: Negative for food allergies.  Neurological: Negative for weakness.  Hematological: Does not bruise/bleed easily.   Psychiatric/Behavioral: Positive for sleep disturbance.     Objective: Vital Signs: BP 129/80   Pulse 89   Ht 5\' 4"  (1.626 m)   Wt 180 lb (81.6 kg)   LMP 01/26/2000   BMI 30.90 kg/m   Physical Exam Constitutional:      Appearance: She is well-developed.  Eyes:     Pupils: Pupils are equal, round, and reactive to light.  Pulmonary:     Effort: Pulmonary effort is normal.  Skin:    General: Skin is warm and dry.  Neurological:     Mental Status: She is alert and oriented to person, place, and time.  Psychiatric:        Behavior: Behavior normal.     Ortho Exam  Exam today reveals well healed surgical incisions on both hands.  The left hand which is the most recent does have some tenderness over the incision site but this is minimal.  She has more tenderness over the volar wrist area proximal to the hand at the carpal tunnel area.  Good capillary refill.  Good strength.  Specialty Comments:  No specialty comments available.  Imaging: No results found.   PMFS History: Current Outpatient Medications  Medication Sig Dispense Refill  . amitriptyline (ELAVIL) 100 MG tablet Take 1 tablet (100 mg total) by mouth daily. TAKE 1/2 (ONE-HALF) TABLET BY MOUTH ONCE DAILY FOR 7 DAYS, THEN INCREASE TO 100 MG (  1 TABLET) DAILY 30 tablet 1  . amLODipine (NORVASC) 10 MG tablet Take 1 tablet (10 mg total) by mouth daily. 90 tablet 3  . famotidine (PEPCID) 20 MG tablet Take 1 tablet (20 mg total) by mouth 2 (two) times daily. To reduce stomach acid 60 tablet 11  . ibuprofen (ADVIL) 800 MG tablet Take 1 tablet (800 mg total) by mouth every 8 (eight) hours as needed. 40 tablet 0  . ibuprofen (ADVIL) 800 MG tablet Take 1 tablet (800 mg total) by mouth every 8 (eight) hours as needed. 90 tablet 1  . levothyroxine (SYNTHROID) 100 MCG tablet Take 1 tablet (100 mcg total) by mouth daily. 90 tablet 3  . losartan (COZAAR) 100 MG tablet Take 1 tablet (100 mg total) by mouth daily. 90 tablet 1  .  metoprolol succinate (TOPROL-XL) 50 MG 24 hr tablet Take 1 tablet (50 mg total) by mouth daily. 90 tablet 1   No current facility-administered medications for this visit.     Patient Active Problem List   Diagnosis Date Noted  . Trigger finger, left ring finger 05/03/2019  . Carpal tunnel syndrome, right upper limb 04/19/2019  . Bilateral carpal tunnel syndrome 12/28/2018  . Abnormal thyroid function test 03/03/2018  . Chronic obstructive pulmonary disease (HCC) 03/03/2018  . Right hip pain 03/03/2018  . Substance abuse (HCC) 03/03/2018  . Preop examination 02/03/2018  . Abnormal PFT 02/03/2018  . Hypothyroidism 02/03/2018  . Spondylolisthesis, lumbar region 01/18/2017  . Cocaine dependence (HCC) 07/25/2015  . 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/25/2015  . Hx of non anemic vitamin B12 deficiency 11/08/2014  . Brain aneurysm 09/09/2014  . Smoker 07/22/2014  . Rhinitis, allergic 07/22/2014  . Chronic low back pain with sciatica 01/24/2013  . Health care maintenance 10/16/2012  . Hot flashes 02/08/2012  . Post-surgical hypothyroidism 12/07/2011  . Fibromyalgia muscle pain 03/31/2011  . Migraine 03/31/2011  . Essential hypertension, benign 11/14/2008  . DEPRESSION 08/10/2006   Past Medical History:  Diagnosis Date  . Abnormal PFT 01/2018  . Anxiety   . Arthritis   . Brain aneurysm    2-3 mm, stable  . Chronic back pain   . Depression    history of  . Grave's disease 2007   TSH (08/31/2010) = 0.186 (low), free T4 = 0.91 (WNL)  . History of substance abuse (HCC)    remote past  . Hypertension    cardiology consult 01/2018, Dr. Sharyn LullHarwani  . Postsurgical hypothyroidism   . Raynaud disease 2007  . Smoker     Family History  Problem Relation Age of Onset  . Colon cancer Maternal Grandfather   . Cancer Maternal Grandfather        prostate  . Hypertension Mother   . Thyroid disease Mother   . Lupus Mother   . Hypertension Father     Past Surgical History:   Procedure Laterality Date  . ABDOMINAL HYSTERECTOMY     due to fibroids, still has ovaries  . CARPAL TUNNEL RELEASE Right 04/19/2019   Procedure: RIGHT CARPAL TUNNEL RELEASE;  Surgeon: Valeria BatmanWhitfield, Peter W, MD;  Location: [facility];  Service: Orthopedics;  Laterality: Right;  . CARPAL TUNNEL RELEASE Left 05/29/2019   Procedure: LEFT CARPAL TUNNEL RELEASE;  Surgeon: Valeria BatmanWhitfield, Peter W, MD;  Location: [facility];  Service: Orthopedics;  Laterality: Left;  . COLONOSCOPY  02/14/2008   done by Dr. Christella HartiganJacobs - small colonic polyp identified, 6 mm in size and per biopsy --> tubular  adenoma with no malignancy or high grade dysplasia noted  . THYROIDECTOMY  03/08/2012   [facility]; history of Graves' disease with multinodular goiter  . TONSILLECTOMY    . TRIGGER FINGER RELEASE Left 05/29/2019   Procedure: RELEASE TRIGGER FINGER/A-1 PULLEY RING FINGER;  Surgeon: Garald Balding, MD;  Location: [facility];  Service: Orthopedics;  Laterality: Left;  . WISDOM TOOTH EXTRACTION     Social History   Occupational History  . Not on file  Tobacco Use  . Smoking status: Current Every Day Smoker    Packs/day: 0.25    Years: 35.00    Pack years: 8.75    Types: Cigarettes  . Smokeless tobacco: Never Used  Substance and Sexual Activity  . Alcohol use: No    Alcohol/week: 0.0 standard drinks    Comment: sober for several years  . Drug use: Not Currently    Types: Cocaine, Marijuana    Comment: last used 09/2018, pt in recovery  . Sexual activity: Not on file

## 2019-08-03 ENCOUNTER — Telehealth: Payer: Self-pay

## 2019-08-03 NOTE — Telephone Encounter (Signed)
Called patient to do their pre-visit COVID screening.  Call went to voicemail. Unable to do prescreening.  

## 2019-08-06 ENCOUNTER — Ambulatory Visit: Payer: Self-pay

## 2019-08-20 ENCOUNTER — Other Ambulatory Visit: Payer: Self-pay

## 2019-08-20 DIAGNOSIS — Z20822 Contact with and (suspected) exposure to covid-19: Secondary | ICD-10-CM

## 2019-08-21 LAB — NOVEL CORONAVIRUS, NAA: SARS-CoV-2, NAA: NOT DETECTED

## 2019-09-18 ENCOUNTER — Ambulatory Visit (HOSPITAL_COMMUNITY)
Admission: EM | Admit: 2019-09-18 | Discharge: 2019-09-18 | Disposition: A | Discharge status: Home / Self Care | Payer: HRSA Program | Attending: Family Medicine | Admitting: Family Medicine

## 2019-09-18 ENCOUNTER — Other Ambulatory Visit: Payer: Self-pay

## 2019-09-18 ENCOUNTER — Encounter (HOSPITAL_COMMUNITY): Payer: Self-pay | Admitting: Emergency Medicine

## 2019-09-18 DIAGNOSIS — R432 Parageusia: Secondary | ICD-10-CM

## 2019-09-18 DIAGNOSIS — Z20822 Contact with and (suspected) exposure to covid-19: Secondary | ICD-10-CM

## 2019-09-18 DIAGNOSIS — Z20828 Contact with and (suspected) exposure to other viral communicable diseases: Secondary | ICD-10-CM | POA: Insufficient documentation

## 2019-09-18 DIAGNOSIS — R430 Anosmia: Secondary | ICD-10-CM

## 2019-09-18 NOTE — ED Provider Notes (Signed)
[facility]    CSN: [id number] Arrival date & time: 09/18/19  1309      History   Chief Complaint Chief Complaint  Patient presents with  . COVID exposure    [name] is a 53 y.o. female.   Patient reports to urgent care today for recent covid exposure and onset of loss of taste and smell. She reports over christmas she had a close and prolonged exposure to a covid positive person. She reports beginning Saturday 12/26 she noticed a decrease in her taste and smell. She also notes a possible episode where she felt short of breath and has a general feeling of "not herself". She is not currently short of breath. She denies fever, chills, headache, sore throat,  cough, chest pain, nausea, vomiting, diarrhea. She has not taking any medications.   She is concerned about testing positive and has brought her grandson with her for testing, as she is his guardian.     Past Medical History:  Diagnosis Date  . Abnormal PFT 01/2018  . Anxiety   . Arthritis   . Brain aneurysm    2-3 mm, stable  . Chronic back pain   . Depression    history of  . Grave's disease 2007   TSH (08/31/2010) = 0.186 (low), free T4 = 0.91 (WNL)  . History of substance abuse (HCC)    remote past  . Hypertension    cardiology consult 01/2018, Dr. Sharyn LullHarwani  . Postsurgical hypothyroidism   . Raynaud disease 2007  . Smoker     Patient Active Problem List   Diagnosis Date Noted  . Trigger finger, left ring finger 05/03/2019  . Carpal tunnel syndrome, right upper limb 04/19/2019  . Bilateral carpal tunnel syndrome 12/28/2018  . Abnormal thyroid function test 03/03/2018  . Chronic obstructive pulmonary disease (HCC) 03/03/2018  . Right hip pain 03/03/2018  . Substance abuse (HCC) 03/03/2018  . Preop examination 02/03/2018  . Abnormal PFT 02/03/2018  . Hypothyroidism 02/03/2018  . Spondylolisthesis, lumbar region 01/18/2017  . Cocaine dependence (HCC) 07/25/2015  . Cocaine-induced  mood disorder (HCC) 07/25/2015  . Hx of non anemic vitamin B12 deficiency 11/08/2014  . Brain aneurysm 09/09/2014  . Smoker 07/22/2014  . Rhinitis, allergic 07/22/2014  . Chronic low back pain with sciatica 01/24/2013  . Health care maintenance 10/16/2012  . Hot flashes 02/08/2012  . Post-surgical hypothyroidism 12/07/2011  . Fibromyalgia muscle pain 03/31/2011  . Migraine 03/31/2011  . Essential hypertension, benign 11/14/2008  . DEPRESSION 08/10/2006    Past Surgical History:  Procedure Laterality Date  . ABDOMINAL HYSTERECTOMY     due to fibroids, still has ovaries  . CARPAL TUNNEL RELEASE Right 04/19/2019   Procedure: RIGHT CARPAL TUNNEL RELEASE;  Surgeon: Valeria BatmanWhitfield, Peter W, MD;  Location: [facility];  Service: Orthopedics;  Laterality: Right;  . CARPAL TUNNEL RELEASE Left 05/29/2019   Procedure: LEFT CARPAL TUNNEL RELEASE;  Surgeon: Valeria BatmanWhitfield, Peter W, MD;  Location:  [facility];  Service: Orthopedics;  Laterality: Left;  . COLONOSCOPY  02/14/2008   done by Dr. Christella HartiganJacobs - small colonic polyp identified, 6 mm in size and per biopsy --> tubular adenoma with no malignancy or high grade dysplasia noted  . THYROIDECTOMY  03/08/2012   [facility]; history of Graves' disease with multinodular goiter  . TONSILLECTOMY    . TRIGGER FINGER RELEASE Left 05/29/2019   Procedure: RELEASE TRIGGER FINGER/A-1 PULLEY RING FINGER;  Surgeon: Valeria BatmanWhitfield, Peter W, MD;  Location:  [place];  Service: Orthopedics;  Laterality: Left;  . WISDOM TOOTH EXTRACTION      OB History    Gravida  3   Para  3   Term  2   Preterm  1   AB  0   Living  2     SAB  0   TAB      Ectopic      Multiple      Live Births               Home Medications    Prior to Admission medications   Medication Sig Start Date End Date Taking? Authorizing Provider  amitriptyline (ELAVIL) 100 MG tablet Take 1 tablet (100 mg total) by mouth daily. TAKE 1/2  (ONE-HALF) TABLET BY MOUTH ONCE DAILY FOR 7 DAYS, THEN INCREASE TO 100 MG ( 1 TABLET) DAILY 07/05/19   Fulp, Cammie, MD  amLODipine (NORVASC) 10 MG tablet Take 1 tablet (10 mg total) by mouth daily. 05/09/19   Gildardo Pounds, NP  famotidine (PEPCID) 20 MG tablet Take 1 tablet (20 mg total) by mouth 2 (two) times daily. To reduce stomach acid 06/11/19   Fulp, Cammie, MD  ibuprofen (ADVIL) 800 MG tablet Take 1 tablet (800 mg total) by mouth every 8 (eight) hours as needed. 06/28/19   Cherylann Ratel, PA-C  levothyroxine (SYNTHROID) 100 MCG tablet Take 1 tablet (100 mcg total) by mouth daily. 02/20/19   Scot Jun, FNP  losartan (COZAAR) 100 MG tablet Take 1 tablet (100 mg total) by mouth daily. 05/11/19   Gildardo Pounds, NP  metoprolol succinate (TOPROL-XL) 50 MG 24 hr tablet Take 1 tablet (50 mg total) by mouth daily. 05/11/19   Gildardo Pounds, NP    Family History Family History  Problem Relation Age of Onset  . Colon cancer Maternal Grandfather   . Cancer Maternal Grandfather        prostate  . Hypertension Mother   . Thyroid disease Mother   . Lupus Mother   . Hypertension Father     Social History Social History   Tobacco Use  . Smoking status: Current Every Day Smoker    Packs/day: 0.25    Years: 35.00    Pack years: 8.75    Types: Cigarettes  . Smokeless tobacco: Never Used  Substance Use Topics  . Alcohol use: No    Alcohol/week: 0.0 standard drinks    Comment: sober for several years  . Drug use: Not Currently    Types: Cocaine, Marijuana    Comment: last used 09/2018, pt in recovery     Allergies   Adhesive [tape], Morphine and related, and Vicodin [hydrocodone-acetaminophen]   Review of Systems Review of Systems  Constitutional: Positive for fatigue. Negative for chills and fever.  HENT: Negative for congestion, ear pain, rhinorrhea, sinus pressure, sinus pain and sore throat.   Eyes: Negative for pain and visual disturbance.  Respiratory: Positive  for shortness of breath. Negative for cough.   Cardiovascular: Negative for chest pain and palpitations.  Gastrointestinal: Negative for abdominal pain and vomiting.  Genitourinary: Negative for dysuria and hematuria.  Musculoskeletal: Negative for arthralgias, back pain and myalgias.  Skin: Negative for color change and rash.  Neurological: Negative for syncope, weakness, light-headedness and headaches.  All other systems reviewed and are negative.    Physical Exam Triage Vital Signs ED Triage Vitals  Enc Vitals Group     BP  Pulse      Resp      Temp      Temp src      SpO2      Weight      Height      Head Circumference      Peak Flow      Pain Score      Pain Loc      Pain Edu?      Excl. in GC?    No data found.  Updated Vital Signs BP (!) 143/79   Pulse 85   Temp 98.3 F (36.8 C) (Oral)   Resp 16   LMP 01/26/2000   SpO2 100%   Visual Acuity Right Eye Distance:   Left Eye Distance:   Bilateral Distance:    Right Eye Near:   Left Eye Near:    Bilateral Near:     Physical Exam Vitals and nursing note reviewed.  Constitutional:      General: She is not in acute distress.    Appearance: She is well-developed.  HENT:     Head: Normocephalic and atraumatic.     Nose: Nose normal.     Mouth/Throat:     Mouth: Mucous membranes are moist.     Pharynx: Oropharynx is clear.  Eyes:     Conjunctiva/sclera: Conjunctivae normal.     Pupils: Pupils are equal, round, and reactive to light.  Cardiovascular:     Rate and Rhythm: Normal rate and regular rhythm.     Pulses: Normal pulses.     Heart sounds: Normal heart sounds. No murmur. No friction rub. No gallop.   Pulmonary:     Effort: Pulmonary effort is normal. No respiratory distress.     Breath sounds: Normal breath sounds. No wheezing, rhonchi or rales.  Abdominal:     Palpations: Abdomen is soft.     Tenderness: There is no abdominal tenderness.  Musculoskeletal:     Cervical back: Neck supple.      Right lower leg: No edema.     Left lower leg: No edema.  Skin:    General: Skin is warm and dry.  Neurological:     General: No focal deficit present.     Mental Status: She is alert and oriented to person, place, and time.  Psychiatric:        Mood and Affect: Mood normal.        Behavior: Behavior normal.        Thought Content: Thought content normal.        Judgment: Judgment normal.      UC Treatments / Results  Labs (all labs ordered are listed, but only abnormal results are displayed) Labs Reviewed  NOVEL CORONAVIRUS, NAA (HOSP ORDER, SEND-OUT TO REF LAB; TAT 18-24 HRS)    EKG   Radiology No results found.  Procedures Procedures (including critical care time)  Medications Ordered in UC Medications - No data to display  Initial Impression / Assessment and Plan / UC Course  I have reviewed the triage vital signs and the nursing notes.  Pertinent labs & imaging results that were available during my care of the patient were reviewed by me and considered in my medical decision making (see chart for details).     #Exposure to Covid 19 - likely she will be positive with known exposure and loss of taste and smell. We discussed things to look for if symptoms progress. ED precautions discussed. COVID PCR sent.  - tylenol  for fever or headache should it arise.   Final Clinical Impressions(s) / UC Diagnoses   Final diagnoses:  Close exposure to COVID-19 virus     Discharge Instructions     Take tylenol 325mg  tablet x 2 up to every 6 hours for sore throat, fever and body aches. Do not exceed 8 tablets in 24 hours   Go directly to the Emergency Department or call 911 if you have severe chest pain, shortness of breath, severe diarrhea or feel as though you might pass out.    If your Covid-19 test is positive, you will receive a phone call from [facility] regarding your results. Negative test results are not called. Both positive and negative results area  always visible on MyChart. If you do not have a MyChart account, sign up instructions are in your discharge papers.   Persons who are directed to care for themselves at home may discontinue isolation under the following conditions:  . At least 10 days have passed since symptom onset and . At least 24 hours have passed without running a fever (this means without the use of fever-reducing medications) and . Other symptoms have improved.  Persons infected with COVID-19 who never develop symptoms may discontinue isolation and other precautions 10 days after the date of their first positive COVID-19 test.     ED Prescriptions    None     PDMP not reviewed this encounter.   [facility], PA-C 09/18/19 1524

## 2019-09-18 NOTE — ED Triage Notes (Signed)
Loss of taste and smell for 2 days. Exposed to someone COVID positive over the holiday.

## 2019-09-18 NOTE — Discharge Instructions (Addendum)
Take tylenol 325mg tablet x 2 up to every 6 hours for sore throat, fever and body aches. Do not exceed 8 tablets in 24 hours  Go directly to the Emergency Department or call 911 if you have severe chest pain, shortness of breath, severe diarrhea or feel as though you might pass out.    If your Covid-19 test is positive, you will receive a phone call from [facility] regarding your results. Negative test results are not called. Both positive and negative results area always visible on MyChart. If you do not have a MyChart account, sign up instructions are in your discharge papers.   Persons who are directed to care for themselves at home may discontinue isolation under the following conditions:   At least 10 days have passed since symptom onset and  At least 24 hours have passed without running a fever (this means without the use of fever-reducing medications) and  Other symptoms have improved.  Persons infected with COVID-19 who never develop symptoms may discontinue isolation and other precautions 10 days after the date of their first positive COVID-19 test.   

## 2019-09-20 LAB — NOVEL CORONAVIRUS, NAA (HOSP ORDER, SEND-OUT TO REF LAB; TAT 18-24 HRS): SARS-CoV-2, NAA: NOT DETECTED

## 2019-09-25 ENCOUNTER — Ambulatory Visit: Payer: BC Managed Care – PPO | Admitting: Orthopaedic Surgery

## 2019-09-25 ENCOUNTER — Other Ambulatory Visit: Payer: Self-pay

## 2019-09-25 ENCOUNTER — Encounter: Payer: Self-pay | Admitting: Orthopaedic Surgery

## 2019-09-25 VITALS — Ht 64.0 in | Wt 182.0 lb

## 2019-09-25 DIAGNOSIS — M65341 Trigger finger, right ring finger: Secondary | ICD-10-CM | POA: Diagnosis not present

## 2019-09-25 NOTE — Progress Notes (Signed)
Office Visit Note   Patient: Amy [name]           Date of Birth: [birth year]           MRN: [id number] Visit Date: 09/25/2019              Requested by: Scot Jun, [address] [place],  [place] [id number] PCP: Scot Jun, FNP   Assessment & Plan: Visit Diagnoses:  1. Trigger finger, right ring finger     Plan: Amy [name] is status post bilateral carpal tunnel releases.  The right hand was done in July and in the left in September.  She also had a trigger finger release in the left hand the ring finger and doing quite well.  She has developed triggering of the right ring and index finger and she would like to proceed with surgery rhythm pursue any conservative treatment.  She is doing quite well in terms of her other surgeries and no longer symptomatic  Follow-Up Instructions: Return We will schedule surgery.   Orders:  No orders of the defined types were placed in this encounter.  No orders of the defined types were placed in this encounter.     Procedures: No procedures performed   Clinical Data: No additional findings.   Subjective: Chief Complaint  Patient presents with  . Right Hand - Pain  . Left Hand - Pain  Patient presents today for follow up on both hands. She had her left hand surgery on 05/29/2019. She had left carpal tunnel and ring trigger finger release. She said that her left hand is overall doing well. Her right hand is painful. She had right carpal tunnel release done on 04/19/2019. Her index and ring finger on the right hand are locking up. They are worse at night. She is concerned that she will need surgery again.   HPI  Review of Systems   Objective: Vital Signs: Ht 5\' 4"  (1.626 m)   Wt 182 lb (82.6 kg)   LMP 01/26/2000   BMI 31.24 kg/m   Physical Exam Constitutional:      Appearance: She is well-developed.  Eyes:     Pupils: Pupils are equal, round, and reactive to light.  Pulmonary:     Effort: Pulmonary  effort is normal.  Skin:    General: Skin is warm and dry.  Neurological:     Mental Status: She is alert and oriented to person, place, and time.  Psychiatric:        Behavior: Behavior normal.     Ortho Exam right and left carpal tunnel incisions of healed without problem.  No further triggering of the left ring finger.  Has active triggering of the right ring and index finger with pain over the palpable nodules on the palmar aspect of the fingers at the level of the metacarpal phalangeal joint.  Neurologically intact.  Normal capillary refill to fingers.  Specialty Comments:  No specialty comments available.  Imaging: No results found.   PMFS History: Patient Active Problem List   Diagnosis Date Noted  . Trigger finger, right ring finger 09/25/2019  . Trigger finger, right index finger 05/03/2019  . Carpal tunnel syndrome, right upper limb 04/19/2019  . Bilateral carpal tunnel syndrome 12/28/2018  . Abnormal thyroid function test 03/03/2018  . Chronic obstructive pulmonary disease ([place]) 03/03/2018  . Right hip pain 03/03/2018  . Substance abuse ([place]) 03/03/2018  . Preop examination 02/03/2018  . Abnormal PFT 02/03/2018  .  Hypothyroidism 02/03/2018  . Spondylolisthesis, lumbar region 01/18/2017  . Cocaine dependence (HCC) 07/25/2015  . 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/25/2015  . Hx of non anemic vitamin B12 deficiency 11/08/2014  . Brain aneurysm 09/09/2014  . Smoker 07/22/2014  . Rhinitis, allergic 07/22/2014  . Chronic low back pain with sciatica 01/24/2013  . Health care maintenance 10/16/2012  . Hot flashes 02/08/2012  . Post-surgical hypothyroidism 12/07/2011  . Fibromyalgia muscle pain 03/31/2011  . Migraine 03/31/2011  . Essential hypertension, benign 11/14/2008  . DEPRESSION 08/10/2006   Past Medical History:  Diagnosis Date  . Abnormal PFT 01/2018  . Anxiety   . Arthritis   . Brain aneurysm    2-3 mm, stable  . Chronic back pain   . Depression     history of  . Grave's disease 2007   TSH (08/31/2010) = 0.186 (low), free T4 = 0.91 (WNL)  . History of substance abuse (HCC)    remote past  . Hypertension    cardiology consult 01/2018, Dr. Sharyn Lull  . Postsurgical hypothyroidism   . Raynaud disease 2007  . Smoker     Family History  Problem Relation Age of Onset  . Colon cancer Maternal Grandfather   . Cancer Maternal Grandfather        prostate  . Hypertension Mother   . Thyroid disease Mother   . Lupus Mother   . Hypertension Father     Past Surgical History:  Procedure Laterality Date  . ABDOMINAL HYSTERECTOMY     due to fibroids, still has ovaries  . CARPAL TUNNEL RELEASE Right 04/19/2019   Procedure: RIGHT CARPAL TUNNEL RELEASE;  Surgeon: Valeria Batman, MD;  Location: [facility];  Service: Orthopedics;  Laterality: Right;  . CARPAL TUNNEL RELEASE Left 05/29/2019   Procedure: LEFT CARPAL TUNNEL RELEASE;  Surgeon: Valeria Batman, MD;  Location: [facility];  Service: Orthopedics;  Laterality: Left;  . COLONOSCOPY  02/14/2008   done by Dr. Christella Hartigan - small colonic polyp identified, 6 mm in size and per biopsy --> tubular adenoma with no malignancy or high grade dysplasia noted  . THYROIDECTOMY  03/08/2012   [facility]; history of Graves' disease with multinodular goiter  . TONSILLECTOMY    . TRIGGER FINGER RELEASE Left 05/29/2019   Procedure: RELEASE TRIGGER FINGER/A-1 PULLEY RING FINGER;  Surgeon: Valeria Batman, MD;  Location: [facility];  Service: Orthopedics;  Laterality: Left;  . WISDOM TOOTH EXTRACTION     Social History   Occupational History  . Not on file  Tobacco Use  . Smoking status: Current Every Day Smoker    Packs/day: 0.25    Years: 35.00    Pack years: 8.75    Types: Cigarettes  . Smokeless tobacco: Never Used  Substance and Sexual Activity  . Alcohol use: No    Alcohol/week: 0.0 standard drinks    Comment: sober for several years  .  Drug use: Not Currently    Types: Cocaine, Marijuana    Comment: last used 09/2018, pt in recovery  . Sexual activity: Not on file

## 2019-10-02 ENCOUNTER — Other Ambulatory Visit: Payer: Self-pay

## 2019-10-02 ENCOUNTER — Ambulatory Visit: Payer: BC Managed Care – PPO | Admitting: Orthopaedic Surgery

## 2019-10-02 ENCOUNTER — Encounter: Payer: Self-pay | Admitting: Orthopaedic Surgery

## 2019-10-02 DIAGNOSIS — M65321 Trigger finger, right index finger: Secondary | ICD-10-CM

## 2019-10-02 DIAGNOSIS — M65341 Trigger finger, right ring finger: Secondary | ICD-10-CM

## 2019-10-02 NOTE — Progress Notes (Signed)
Office Visit Note   Patient: Amy [name]           Date of Birth: 08-22-1966           MRN: [id number] Visit Date: 10/02/2019              Requested by: Scot Jun, [address] [place],  [place] [address] PCP: Scot Jun, FNP   Assessment & Plan: Visit Diagnoses:  1. Trigger finger, right index finger   2. Trigger finger, right ring finger     Plan: Amy [name] was scheduled to have right ring and index finger trigger surgery this coming Thursday but insurance and co-pay was an issue at [facility].  We had a long discussion regarding the surgery.  I think she is going to need some type of IV sedation in addition to the local anesthesia so I think doing it at [facility] day surgery would be helpful is that she could have a payment plan.  Discussed this over 30 minutes.  Will not inject preoperatively to limit risk of infection. Also having an issue with her right shoulder with multiple areas of trigger point tenderness.  I wonder if she does not have fibromyalgia.  She needs to establish contact with her primary care physician now that she has insurance  Follow-Up Instructions: Return Schedule surgery for right ring and index finger triggering.   Orders:  No orders of the defined types were placed in this encounter.  No orders of the defined types were placed in this encounter.     Procedures: No procedures performed   Clinical Data: No additional findings.   Subjective: Chief Complaint  Patient presents with  . Right Hand - Pain  Patient presents today for her right hand pain. She was last here on 09/25/2019 for right trigger finger of the index finger. Patient states that she does not want an injection. She was scheduled for surgery, but unable to do that due to insurance reasons. She is taking Ibuprofen.   HPI  Review of Systems  Constitutional: Negative for fatigue.  HENT: Negative for ear pain.   Eyes: Negative for pain.  Respiratory: Negative  for shortness of breath.   Cardiovascular: Negative for leg swelling.  Gastrointestinal: Negative for constipation and diarrhea.  Endocrine: Positive for cold intolerance and heat intolerance.  Genitourinary: Negative for difficulty urinating.  Musculoskeletal: Negative for joint swelling.  Skin: Negative for rash.  Allergic/Immunologic: Negative for food allergies.  Neurological: Negative for weakness.  Hematological: Does not bruise/bleed easily.  Psychiatric/Behavioral: Positive for sleep disturbance.     Objective: Vital Signs: LMP 01/26/2000   Physical Exam Constitutional:      Appearance: She is well-developed.  Eyes:     Pupils: Pupils are equal, round, and reactive to light.  Pulmonary:     Effort: Pulmonary effort is normal.  Skin:    General: Skin is warm and dry.  Neurological:     Mental Status: She is alert and oriented to person, place, and time.  Psychiatric:        Behavior: Behavior normal.     Ortho Exam awake alert and oriented x3.  Comfortable sitting stable areas of trigger point tenderness along the right scapula with negative impingement and negative empty can testing.  Full range of motion.  Skin intact.  Some tenderness over the A1 pulley of the right index and ring fingers on the palm neurovascular exam intact.  Good capillary refill.  No active  triggering today  Specialty Comments:  No specialty comments available.  Imaging: No results found.   PMFS History: Patient Active Problem List   Diagnosis Date Noted  . Trigger finger, right ring finger 09/25/2019  . Trigger finger, right index finger 05/03/2019  . Carpal tunnel syndrome, right upper limb 04/19/2019  . Bilateral carpal tunnel syndrome 12/28/2018  . Abnormal thyroid function test 03/03/2018  . Chronic obstructive pulmonary disease (HCC) 03/03/2018  . Right hip pain 03/03/2018  . Substance abuse (HCC) 03/03/2018  . Preop examination 02/03/2018  . Abnormal PFT 02/03/2018  .  Hypothyroidism 02/03/2018  . Spondylolisthesis, lumbar region 01/18/2017  . Cocaine dependence (HCC) 07/25/2015  . 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/25/2015  . Hx of non anemic vitamin B12 deficiency 11/08/2014  . Brain aneurysm 09/09/2014  . Smoker 07/22/2014  . Rhinitis, allergic 07/22/2014  . Chronic low back pain with sciatica 01/24/2013  . Health care maintenance 10/16/2012  . Hot flashes 02/08/2012  . Post-surgical hypothyroidism 12/07/2011  . Fibromyalgia muscle pain 03/31/2011  . Migraine 03/31/2011  . Essential hypertension, benign 11/14/2008  . DEPRESSION 08/10/2006   Past Medical History:  Diagnosis Date  . Abnormal PFT 01/2018  . Anxiety   . Arthritis   . Brain aneurysm    2-3 mm, stable  . Chronic back pain   . Depression    history of  . Grave's disease 2007   TSH (08/31/2010) = 0.186 (low), free T4 = 0.91 (WNL)  . History of substance abuse (HCC)    remote past  . Hypertension    cardiology consult 01/2018, Dr. Sharyn Lull  . Postsurgical hypothyroidism   . Raynaud disease 2007  . Smoker     Family History  Problem Relation Age of Onset  . Colon cancer Maternal Grandfather   . Cancer Maternal Grandfather        prostate  . Hypertension Mother   . Thyroid disease Mother   . Lupus Mother   . Hypertension Father     Past Surgical History:  Procedure Laterality Date  . ABDOMINAL HYSTERECTOMY     due to fibroids, still has ovaries  . CARPAL TUNNEL RELEASE Right 04/19/2019   Procedure: RIGHT CARPAL TUNNEL RELEASE;  Surgeon: Valeria Batman, MD;  Location: [facility];  Service: Orthopedics;  Laterality: Right;  . CARPAL TUNNEL RELEASE Left 05/29/2019   Procedure: LEFT CARPAL TUNNEL RELEASE;  Surgeon: Valeria Batman, MD;  Location: [facility];  Service: Orthopedics;  Laterality: Left;  . COLONOSCOPY  02/14/2008   done by Dr. Christella Hartigan - small colonic polyp identified, 6 mm in size and per biopsy --> tubular adenoma with no  malignancy or high grade dysplasia noted  . THYROIDECTOMY  03/08/2012   [facility]; history of Graves' disease with multinodular goiter  . TONSILLECTOMY    . TRIGGER FINGER RELEASE Left 05/29/2019   Procedure: RELEASE TRIGGER FINGER/A-1 PULLEY RING FINGER;  Surgeon: Valeria Batman, MD;  Location: [facility];  Service: Orthopedics;  Laterality: Left;  . WISDOM TOOTH EXTRACTION     Social History   Occupational History  . Not on file  Tobacco Use  . Smoking status: Current Every Day Smoker    Packs/day: 0.25    Years: 35.00    Pack years: 8.75    Types: Cigarettes  . Smokeless tobacco: Never Used  Substance and Sexual Activity  . Alcohol use: No    Alcohol/week: 0.0 standard drinks    Comment: sober for  several years  . Drug use: Not Currently    Types: Cocaine, Marijuana    Comment: last used 09/2018, pt in recovery  . Sexual activity: Not on file

## 2019-10-09 ENCOUNTER — Encounter (HOSPITAL_BASED_OUTPATIENT_CLINIC_OR_DEPARTMENT_OTHER): Payer: Self-pay | Admitting: Orthopaedic Surgery

## 2019-10-09 ENCOUNTER — Other Ambulatory Visit: Payer: Self-pay

## 2019-10-10 ENCOUNTER — Inpatient Hospital Stay: Payer: BC Managed Care – PPO | Admitting: Orthopaedic Surgery

## 2019-10-10 ENCOUNTER — Other Ambulatory Visit: Payer: Self-pay | Admitting: Family Medicine

## 2019-10-12 ENCOUNTER — Other Ambulatory Visit (HOSPITAL_COMMUNITY)
Admission: RE | Admit: 2019-10-12 | Discharge: 2019-10-12 | Disposition: A | Discharge status: Home / Self Care | Payer: BC Managed Care – PPO | Source: Ambulatory Visit | Attending: Orthopaedic Surgery | Admitting: Orthopaedic Surgery

## 2019-10-12 DIAGNOSIS — Z01812 Encounter for preprocedural laboratory examination: Secondary | ICD-10-CM | POA: Insufficient documentation

## 2019-10-12 DIAGNOSIS — U071 COVID-19: Secondary | ICD-10-CM | POA: Insufficient documentation

## 2019-10-12 LAB — SARS CORONAVIRUS 2 (TAT 6-24 HRS): SARS Coronavirus 2: POSITIVE — AB

## 2019-10-13 ENCOUNTER — Telehealth: Payer: Self-pay

## 2019-10-13 ENCOUNTER — Telehealth: Payer: Self-pay | Admitting: Physician Assistant

## 2019-10-13 NOTE — Telephone Encounter (Signed)
Pt notified of positive COVID-19 test results. Pt verbalized understanding. Pt reports that they are feeling no sx.Pt advised to remain in self quarantine until at least 10 days since symptom onset And 3 consecutive days fever free without antipyretics And improvement in respiratory symptoms. Patient advised to utilize over the counter medications to treat symptoms. Pt advised to seek treatment in the ED if respiratory issues/distress develops.Pt advised they should only leave home to seek and medical care and must wear a mask in public. Pt instructed to limit contact with family members or caregivers in the home. Pt advised to practice social distancing and to continue to use good preventative care measures such has frequent hand washing, staying out of crowds and cleaning hard surfaces frequently touched in the home.Pt informed that the health department will likely follow up and may have additional recommendations. This test was done for PAT.

## 2019-10-13 NOTE — Telephone Encounter (Signed)
Called to discuss with [name] about Covid symptoms and the use of bamlanivimab, a monoclonal antibody infusion for those with mild to moderate Covid symptoms and at a high risk of hospitalization.     Pt does not qualify for infusion therapy as she has asymptomatic infection. Isolation precautions discussed. Advised to contact back for consideration should she develop symptoms. Patient verbalized understanding.     Manson Passey, PA - C

## 2019-10-14 NOTE — Progress Notes (Signed)
I called-pt aware of test results

## 2019-10-14 NOTE — Progress Notes (Signed)
Pos for covid-will need to be rescheduled

## 2019-10-15 ENCOUNTER — Encounter: Payer: Self-pay | Admitting: Internal Medicine

## 2019-10-15 ENCOUNTER — Ambulatory Visit (INDEPENDENT_AMBULATORY_CARE_PROVIDER_SITE_OTHER): Payer: BC Managed Care – PPO | Admitting: Internal Medicine

## 2019-10-15 DIAGNOSIS — U071 COVID-19: Secondary | ICD-10-CM

## 2019-10-15 DIAGNOSIS — E039 Hypothyroidism, unspecified: Secondary | ICD-10-CM | POA: Diagnosis not present

## 2019-10-15 MED ORDER — AMITRIPTYLINE HCL 100 MG PO TABS: 100.0000 mg | ORAL_TABLET | Freq: Every day | ORAL | 5 refills | Status: AC

## 2019-10-15 NOTE — Progress Notes (Signed)
Virtual Visit via Telephone Note  I connected with Amy [name], on 10/15/2019 at 8:41 AM by telephone due to the COVID-19 pandemic and verified that I am speaking with the correct person using two identifiers.   Consent: I discussed the limitations, risks, security and privacy concerns of performing an evaluation and management service by telephone and the availability of in person appointments. I also discussed with the patient that there may be a patient responsible charge related to this service. The patient expressed understanding and agreed to proceed.   Location of Patient: Home  Location of Provider: Clinic   Persons participating in Telemedicine visit: Ashna M [facility] Dr. Juleen China    History of Present Illness: Tested positive for COVID on 1/22 when having screening labs for trigger finger surgery. Patient reports she is essentially asymptomatic. She caught it from her grandson who was COVID+ after being in [place] for the Christmas holidays.   In terms of her thyroid, feels like she can tell when her thyroid is off. Notices some weight fluctuations and fatigue. Her skin will get dry.   Was taken off Synthroid and then put back on. She really wishes she could get her TSH checked today.   She is compliant with Synthroid 100 mcg. No issues with taking medication.    Past Medical History:  Diagnosis Date  . Abnormal PFT 01/2018  . Anxiety   . Arthritis   . Brain aneurysm    2-3 mm, stable  . Chronic back pain   . Depression    history of  . Grave's disease 2007   TSH (08/31/2010) = 0.186 (low), free T4 = 0.91 (WNL)  . History of substance abuse ([place])    remote past  . Hypertension    cardiology consult 01/2018, Dr. Terrence Dupont  . Postsurgical hypothyroidism   . Raynaud disease 2007  . Smoker    Allergies  Allergen Reactions  . Adhesive [Tape] Other (See Comments)    Pulls skin off  . Morphine And Related Nausea And Vomiting and Other (See  Comments)    Does not like feeling  . Vicodin [Hydrocodone-Acetaminophen] Itching    Current Outpatient Medications on File Prior to Visit  Medication Sig Dispense Refill  . amitriptyline (ELAVIL) 100 MG tablet Take 1 tablet (100 mg total) by mouth at bedtime. Please make PCP appointment for more refills. 30 tablet 0  . amLODipine (NORVASC) 10 MG tablet Take 1 tablet (10 mg total) by mouth daily. 90 tablet 3  . ibuprofen (ADVIL) 800 MG tablet Take 1 tablet (800 mg total) by mouth every 8 (eight) hours as needed. 90 tablet 1  . levothyroxine (SYNTHROID) 100 MCG tablet Take 1 tablet (100 mcg total) by mouth daily. 90 tablet 3  . losartan (COZAAR) 100 MG tablet Take 1 tablet (100 mg total) by mouth daily. 90 tablet 1  . metoprolol succinate (TOPROL-XL) 50 MG 24 hr tablet Take 1 tablet (50 mg total) by mouth daily. 90 tablet 1   No current facility-administered medications on file prior to visit.    Observations/Objective: NAD. Speaking clearly.  Work of breathing normal.  Alert and oriented. Mood appropriate.   Assessment and Plan: 1. Hypothyroidism, unspecified type Reviewed TSH trend. 1.56 in May 2020 > 70 in June >2.4 in August. Would like to recheck especially given that patient is reporting some symptoms that could be associated with thyroid disorder as well as previously unstable TSH trend. Given recent COVID+ diagnosis, patient will need to wait  until quarantine period is over. Have placed order for future lab draw.  - Thyroid Panel With TSH; Future  2. COVID-19 Currently asymptomatic. Taking Zinc and Vit C. Discussed at home therapies if she were to become symptomatic and reasons to seek emergency medical care. Adhere to the 3W's and limit exposure to people outside of direct household.    Follow Up Instructions: Return for thyroid labs.    I discussed the assessment and treatment plan with the patient. The patient was provided an opportunity to ask questions and all were  answered. The patient agreed with the plan and demonstrated an understanding of the instructions.   The patient was advised to call back or seek an in-person evaluation if the symptoms worsen or if the condition fails to improve as anticipated.     I provided 16 minutes total of non-face-to-face time during this encounter including median intraservice time, reviewing previous notes, investigations, ordering medications, medical decision making, coordinating care and patient verbalized understanding at the end of the visit.    Marcy Siren, D.O. Primary Care at [facility]  10/15/2019, 8:41 AM

## 2019-10-16 ENCOUNTER — Ambulatory Visit (HOSPITAL_BASED_OUTPATIENT_CLINIC_OR_DEPARTMENT_OTHER)
Admission: RE | Admit: 2019-10-16 | Payer: BC Managed Care – PPO | Source: Home / Self Care | Admitting: Orthopaedic Surgery

## 2019-10-16 SURGERY — RELEASE, A1 PULLEY, FOR TRIGGER FINGER
Anesthesia: Choice | Laterality: Right

## 2019-10-19 ENCOUNTER — Ambulatory Visit: Payer: BC Managed Care – PPO | Attending: Internal Medicine

## 2019-10-19 DIAGNOSIS — Z20822 Contact with and (suspected) exposure to covid-19: Secondary | ICD-10-CM | POA: Insufficient documentation

## 2019-10-20 LAB — NOVEL CORONAVIRUS, NAA: SARS-CoV-2, NAA: NOT DETECTED

## 2019-10-23 ENCOUNTER — Encounter (HOSPITAL_BASED_OUTPATIENT_CLINIC_OR_DEPARTMENT_OTHER): Payer: Self-pay | Admitting: Orthopaedic Surgery

## 2019-10-23 ENCOUNTER — Other Ambulatory Visit: Payer: Self-pay | Admitting: Family

## 2019-10-23 ENCOUNTER — Other Ambulatory Visit: Payer: Self-pay

## 2019-10-24 ENCOUNTER — Inpatient Hospital Stay: Payer: BLUE CROSS/BLUE SHIELD | Admitting: Orthopaedic Surgery

## 2019-10-25 NOTE — Progress Notes (Signed)
Contacted pt regarding pre-procedure covid test. Pt tested positive within the last 90 days. Per anesthesia guidelines, pt will not be re-tested prior to surgery. Pt made aware and appointment has been cancelled.   Viviano Simas, RN

## 2019-10-26 ENCOUNTER — Other Ambulatory Visit (HOSPITAL_COMMUNITY): Payer: BLUE CROSS/BLUE SHIELD

## 2019-10-30 ENCOUNTER — Ambulatory Visit (HOSPITAL_BASED_OUTPATIENT_CLINIC_OR_DEPARTMENT_OTHER)
Admission: RE | Admit: 2019-10-30 | Discharge: 2019-10-30 | Disposition: A | Discharge status: Home / Self Care | Payer: BLUE CROSS/BLUE SHIELD | Attending: Orthopaedic Surgery | Admitting: Orthopaedic Surgery

## 2019-10-30 ENCOUNTER — Other Ambulatory Visit: Payer: Self-pay

## 2019-10-30 ENCOUNTER — Encounter (HOSPITAL_BASED_OUTPATIENT_CLINIC_OR_DEPARTMENT_OTHER)
Admission: RE | Disposition: A | Discharge status: Home / Self Care | Payer: Self-pay | Source: Home / Self Care | Attending: Orthopaedic Surgery

## 2019-10-30 ENCOUNTER — Ambulatory Visit (HOSPITAL_BASED_OUTPATIENT_CLINIC_OR_DEPARTMENT_OTHER): Payer: BLUE CROSS/BLUE SHIELD | Admitting: Anesthesiology

## 2019-10-30 ENCOUNTER — Encounter (HOSPITAL_BASED_OUTPATIENT_CLINIC_OR_DEPARTMENT_OTHER): Payer: Self-pay | Admitting: Orthopaedic Surgery

## 2019-10-30 DIAGNOSIS — J449 Chronic obstructive pulmonary disease, unspecified: Secondary | ICD-10-CM | POA: Diagnosis not present

## 2019-10-30 DIAGNOSIS — F17200 Nicotine dependence, unspecified, uncomplicated: Secondary | ICD-10-CM | POA: Diagnosis not present

## 2019-10-30 DIAGNOSIS — E890 Postprocedural hypothyroidism: Secondary | ICD-10-CM | POA: Insufficient documentation

## 2019-10-30 DIAGNOSIS — F329 Major depressive disorder, single episode, unspecified: Secondary | ICD-10-CM | POA: Diagnosis not present

## 2019-10-30 DIAGNOSIS — Z79899 Other long term (current) drug therapy: Secondary | ICD-10-CM | POA: Insufficient documentation

## 2019-10-30 DIAGNOSIS — Z8616 Personal history of COVID-19: Secondary | ICD-10-CM | POA: Diagnosis not present

## 2019-10-30 DIAGNOSIS — M65321 Trigger finger, right index finger: Secondary | ICD-10-CM

## 2019-10-30 DIAGNOSIS — M65341 Trigger finger, right ring finger: Secondary | ICD-10-CM | POA: Insufficient documentation

## 2019-10-30 DIAGNOSIS — I10 Essential (primary) hypertension: Secondary | ICD-10-CM | POA: Diagnosis not present

## 2019-10-30 DIAGNOSIS — Z79890 Hormone replacement therapy (postmenopausal): Secondary | ICD-10-CM | POA: Diagnosis not present

## 2019-10-30 HISTORY — PX: TRIGGER FINGER RELEASE: SHX641

## 2019-10-30 SURGERY — RELEASE, A1 PULLEY, FOR TRIGGER FINGER
Anesthesia: Regional | Site: Hand | Laterality: Right

## 2019-10-30 MED ORDER — MIDAZOLAM HCL 5 MG/5ML IJ SOLN
INTRAMUSCULAR | Status: DC | PRN
  Administered 2019-10-30: 2 mg via INTRAVENOUS

## 2019-10-30 MED ORDER — PROPOFOL 10 MG/ML IV BOLUS
INTRAVENOUS | Status: AC
  Filled 2019-10-30: qty 20

## 2019-10-30 MED ORDER — ACETAMINOPHEN 10 MG/ML IV SOLN: 1000.0000 mg | Freq: Once | INTRAVENOUS | Status: DC | PRN

## 2019-10-30 MED ORDER — FENTANYL CITRATE (PF) 100 MCG/2ML IJ SOLN: 50.0000 ug | INTRAMUSCULAR | Status: DC | PRN

## 2019-10-30 MED ORDER — MIDAZOLAM HCL 2 MG/2ML IJ SOLN
INTRAMUSCULAR | Status: AC
  Filled 2019-10-30: qty 2

## 2019-10-30 MED ORDER — LIDOCAINE HCL (PF) 0.5 % IJ SOLN
INTRAMUSCULAR | Status: DC | PRN
  Administered 2019-10-30: 35 mL via INTRAVENOUS

## 2019-10-30 MED ORDER — BUPIVACAINE HCL (PF) 0.25 % IJ SOLN
INTRAMUSCULAR | Status: DC | PRN
  Administered 2019-10-30: 5 mL

## 2019-10-30 MED ORDER — ONDANSETRON HCL 4 MG/2ML IJ SOLN
INTRAMUSCULAR | Status: AC
  Filled 2019-10-30: qty 2

## 2019-10-30 MED ORDER — KETOROLAC TROMETHAMINE 30 MG/ML IJ SOLN: 30.0000 mg | Freq: Once | INTRAMUSCULAR | Status: DC

## 2019-10-30 MED ORDER — HYDROMORPHONE HCL 1 MG/ML IJ SOLN: 0.2500 mg | INTRAMUSCULAR | Status: DC | PRN

## 2019-10-30 MED ORDER — LACTATED RINGERS IV SOLN: INTRAVENOUS | Status: DC

## 2019-10-30 MED ORDER — PROPOFOL 500 MG/50ML IV EMUL
INTRAVENOUS | Status: DC | PRN
  Administered 2019-10-30: 150 ug/kg/min via INTRAVENOUS

## 2019-10-30 MED ORDER — TRAMADOL HCL 50 MG PO TABS
50.0000 mg | ORAL_TABLET | Freq: Once | ORAL | Status: AC | PRN
  Administered 2019-10-30: 50 mg via ORAL

## 2019-10-30 MED ORDER — EPHEDRINE 5 MG/ML INJ
INTRAVENOUS | Status: AC
  Filled 2019-10-30: qty 10

## 2019-10-30 MED ORDER — MIDAZOLAM HCL 2 MG/2ML IJ SOLN: 1.0000 mg | INTRAMUSCULAR | Status: DC | PRN

## 2019-10-30 MED ORDER — TRAMADOL HCL 50 MG PO TABS
ORAL_TABLET | ORAL | Status: AC
  Filled 2019-10-30: qty 1

## 2019-10-30 MED ORDER — FENTANYL CITRATE (PF) 100 MCG/2ML IJ SOLN
INTRAMUSCULAR | Status: AC
  Filled 2019-10-30: qty 2

## 2019-10-30 MED ORDER — PROMETHAZINE HCL 25 MG/ML IJ SOLN: 6.2500 mg | INTRAMUSCULAR | Status: DC | PRN

## 2019-10-30 MED ORDER — BUPIVACAINE HCL (PF) 0.25 % IJ SOLN
INTRAMUSCULAR | Status: AC
  Filled 2019-10-30: qty 30

## 2019-10-30 MED ORDER — FENTANYL CITRATE (PF) 250 MCG/5ML IJ SOLN
INTRAMUSCULAR | Status: DC | PRN
  Administered 2019-10-30: 25 ug via INTRAVENOUS
  Administered 2019-10-30: 50 ug via INTRAVENOUS
  Administered 2019-10-30: 25 ug via INTRAVENOUS

## 2019-10-30 SURGICAL SUPPLY — 46 items
BLADE SURG 15 STRL LF DISP TIS (BLADE) ×1 IMPLANT
BLADE SURG 15 STRL SS (BLADE) ×2
BNDG CMPR 9X4 STRL LF SNTH (GAUZE/BANDAGES/DRESSINGS) ×1
BNDG COHESIVE 2X5 TAN STRL LF (GAUZE/BANDAGES/DRESSINGS) IMPLANT
BNDG ELASTIC 3X5.8 VLCR STR LF (GAUZE/BANDAGES/DRESSINGS) IMPLANT
BNDG ESMARK 4X9 LF (GAUZE/BANDAGES/DRESSINGS) ×2 IMPLANT
CANISTER SUCT 1200ML W/VALVE (MISCELLANEOUS) IMPLANT
COVER BACK TABLE 60X90IN (DRAPES) ×2 IMPLANT
COVER MAYO STAND STRL (DRAPES) ×2 IMPLANT
COVER WAND RF STERILE (DRAPES) IMPLANT
CUFF TOURN SGL QUICK 18X4 (TOURNIQUET CUFF) ×2 IMPLANT
DRAPE EXTREMITY T 121X128X90 (DISPOSABLE) ×2 IMPLANT
DRAPE SURG 17X23 STRL (DRAPES) ×2 IMPLANT
DRSG EMULSION OIL 3X3 NADH (GAUZE/BANDAGES/DRESSINGS) ×1 IMPLANT
DRSG PAD ABDOMINAL 8X10 ST (GAUZE/BANDAGES/DRESSINGS) ×2 IMPLANT
DURAPREP 26ML APPLICATOR (WOUND CARE) ×2 IMPLANT
ELECT NEEDLE TIP 2.8 STRL (NEEDLE) ×2 IMPLANT
ELECT REM PT RETURN 9FT ADLT (ELECTROSURGICAL)
ELECTRODE REM PT RTRN 9FT ADLT (ELECTROSURGICAL) IMPLANT
GAUZE SPONGE 4X4 12PLY STRL (GAUZE/BANDAGES/DRESSINGS) ×2 IMPLANT
GLOVE BIO SURGEON STRL SZ 6.5 (GLOVE) ×2 IMPLANT
GLOVE BIOGEL PI IND STRL 7.0 (GLOVE) IMPLANT
GLOVE BIOGEL PI IND STRL 8 (GLOVE) ×1 IMPLANT
GLOVE BIOGEL PI INDICATOR 7.0 (GLOVE) ×3
GLOVE BIOGEL PI INDICATOR 8 (GLOVE) ×1
GLOVE ECLIPSE 8.0 STRL XLNG CF (GLOVE) ×2 IMPLANT
GOWN STRL REUS W/ TWL LRG LVL3 (GOWN DISPOSABLE) ×1 IMPLANT
GOWN STRL REUS W/TWL LRG LVL3 (GOWN DISPOSABLE) ×2
NDL HYPO 25X1 1.5 SAFETY (NEEDLE) IMPLANT
NEEDLE HYPO 25X1 1.5 SAFETY (NEEDLE) ×2 IMPLANT
NS IRRIG 1000ML POUR BTL (IV SOLUTION) ×2 IMPLANT
PACK BASIN DAY SURGERY FS (CUSTOM PROCEDURE TRAY) ×2 IMPLANT
PAD CAST 3X4 CTTN HI CHSV (CAST SUPPLIES) ×1 IMPLANT
PADDING CAST ABS 3INX4YD NS (CAST SUPPLIES) ×1
PADDING CAST ABS COTTON 3X4 (CAST SUPPLIES) ×1 IMPLANT
PADDING CAST COTTON 3X4 STRL (CAST SUPPLIES) ×2
PENCIL SMOKE EVACUATOR (MISCELLANEOUS) IMPLANT
STOCKINETTE 4X48 STRL (DRAPES) ×2 IMPLANT
SUCTION FRAZIER HANDLE 10FR (MISCELLANEOUS)
SUCTION TUBE FRAZIER 10FR DISP (MISCELLANEOUS) IMPLANT
SUT ETHILON 4 0 PS 2 18 (SUTURE) ×1 IMPLANT
SYR BULB 3OZ (MISCELLANEOUS) ×2 IMPLANT
SYR CONTROL 10ML LL (SYRINGE) ×1 IMPLANT
TOWEL GREEN STERILE FF (TOWEL DISPOSABLE) ×2 IMPLANT
TUBE CONNECTING 20X1/4 (TUBING) IMPLANT
UNDERPAD 30X36 HEAVY ABSORB (UNDERPADS AND DIAPERS) ×2 IMPLANT

## 2019-10-30 NOTE — Anesthesia Postprocedure Evaluation (Signed)
Anesthesia Post Note  Patient: Amy [name]  Procedure(s) Performed: RELEASE RING AND INDEX FINGER TRIGGER FINGERS /A-1 PULLEY (Right Hand)     Patient location during evaluation: PACU Anesthesia Type: Bier Block Level of consciousness: awake and alert Pain management: pain level controlled Vital Signs Assessment: post-procedure vital signs reviewed and stable Respiratory status: spontaneous breathing, nonlabored ventilation, respiratory function stable and patient connected to nasal cannula oxygen Cardiovascular status: stable and blood pressure returned to baseline Postop Assessment: no apparent nausea or vomiting Anesthetic complications: no    Last Vitals:  Vitals:   10/30/19 1030 10/30/19 1100  BP: (!) 156/73 (!) 159/81  Pulse: 86 86  Resp: 15 16  Temp:  36.6 C  SpO2: 100% 100%    Last Pain:  Vitals:   10/30/19 1100  TempSrc:   PainSc: 3                  Darral Rishel P Lutisha Knoche

## 2019-10-30 NOTE — Anesthesia Procedure Notes (Signed)
Anesthesia Regional Block: Bier block (IV Regional)   Pre-Anesthetic Checklist: ,, timeout performed, Correct Patient, Correct Site, Correct Laterality, Correct Procedure, Correct Position, site marked, Risks and benefits discussed, Surgical consent, Pre-op evaluation  Laterality: Right  Prep: alcohol swabs       Needles:  Injection technique: Single-shot      Needle Gauge: 20     Additional Needles:   Procedures:,,,,,, Esmarch exsanguination, single tourniquet utilized, #20gu IV placed  Narrative:  Start time: 10/30/2019 9:35 AM End time: 10/30/2019 9:36 AM Injection made incrementally with aspirations every 35 mL.  Events:,, positive IV test,,,,,,,,  Performed by: Personally

## 2019-10-30 NOTE — Op Note (Signed)
NAME: Amy [name], KOPECKY MEDICAL RECORD [id number] ACCOUNT [id number] DATE OF BIRTH:1966-01-08 FACILITY: [facility] LOCATION: [place], [place]  OPERATIVE REPORT  DATE OF PROCEDURE:  10/30/2019  PREOPERATIVE DIAGNOSIS:  Right ring and index trigger fingers.  POSTOPERATIVE DIAGNOSIS:  Right ring and index trigger fingers.  PROCEDURE:  Release of right ring and index trigger fingers.  SURGEON:  Joni Fears, MD  ASSISTANT:  None.  ANESTHESIA:  Bier block with IV sedation.  COMPLICATIONS:  None.  HISTORY:  A 54 year old female has developed active triggering of the right index and long finger that has not responded to conservative treatment.  The patient has had prior trigger fingering on the left hand with successful surgery and she wishes to  proceed with surgical release today.  DESCRIPTION OF PROCEDURE:  [name], identified the right ring and index finger as the appropriate site and marked it accordingly.  Anesthesia performed an IV access in the dorsum of the right hand.  The patient was then transported to room #1 and under IV sedation, a proximal arm tourniquet was then placed on the right forearm and a Bier block was performed without difficulty.  The right upper extremity was then prepped with DuraPrep from the tips of the fingers to the tourniquet.  Sterile draping was performed.  A timeout was called.  Skin incisions were outlined along the transverse skin crease over the A1 pulley of the index and ring fingers.  I initially performed surgery on the index finger.  About a 1/2-inch incision was made via sharp dissection, carried down through  subcutaneous tissue.  Then, using a blunt tip scissors and dissecting longitudinally, soft tissue was elevated from the flexor tendon sheath and the A1 pulley.  A thick band of tissue was identified transversely and this was incised, including the A1  pulley.  There was no fluid  in the sheath.  At that point, I could no longer passively trigger the finger.  The wound was irrigated with saline solution and the skin closed with interrupted 4-0 Ethilon.  A similar procedure was then performed on the index finger.  I was able to incise the skin with the 15 blade knife about 1/2-inch incision.  Then, via blunt dissection, soft tissue was elevated from the flexor tendon sheath.  Blunt tipped retractors were  inserted.  A transverse band of thickened tissue including the A1 pulley were then incised longitudinally.  Flexor tendon sheath was otherwise intact without fluid or change to the tendons.  On release, I could no longer trigger the finger passively.  The wound was irrigated with saline solution, skin closed with interrupted 4-0 Ethilon.  I did infiltrate the skin with 0.25% Marcaine without epinephrine.  Sterile bulky dressing was applied, followed by an Ace bandage.  Tourniquet was released with immediate capillary refill to the fingers.  PLAN:  Ibuprofen for pain.  Office first of the week.  VN/NUANCE  D:10/30/2019 T:10/30/2019 JOB:[id number]

## 2019-10-30 NOTE — H&P (Signed)
The recent History & Physical has been reviewed. I have personally examined the patient today. There is no interval change to the documented History & Physical. The patient would like to proceed with the procedure.  Valeria Batman 10/30/2019,  9:18 AM

## 2019-10-30 NOTE — Discharge Instructions (Signed)

## 2019-10-30 NOTE — Anesthesia Preprocedure Evaluation (Addendum)
Anesthesia Evaluation  Patient identified by MRN, date of birth, ID band Patient awake    Reviewed: Allergy & Precautions, NPO status , Patient's Chart, lab work & pertinent test results  Airway Mallampati: II  TM Distance: >3 FB Neck ROM: Full    Dental  (+) Missing   Pulmonary COPD, Current SmokerPatient did not abstain from smoking.,    Pulmonary exam normal breath sounds clear to auscultation       Cardiovascular hypertension, Pt. on medications and Pt. on home beta blockers Normal cardiovascular exam Rhythm:Regular Rate:Normal     Neuro/Psych  Headaches, PSYCHIATRIC DISORDERS Anxiety Depression    GI/Hepatic negative GI ROS, Neg liver ROS,   Endo/Other  Hypothyroidism   Renal/GU negative Renal ROS     Musculoskeletal Chronic back pain   Abdominal   Peds  Hematology negative hematology ROS (+)   Anesthesia Other Findings RIGHT RING AND INDEX TRIGGER FINGERS  Reproductive/Obstetrics                           Anesthesia Physical Anesthesia Plan  ASA: III  Anesthesia Plan: Bier Block and Bier Block-LIDOCAINE ONLY   Post-op Pain Management:    Induction: Intravenous  PONV Risk Score and Plan: 1 and Ondansetron, Dexamethasone, Midazolam and Treatment may vary due to age or medical condition  Airway Management Planned: Natural Airway  Additional Equipment:   Intra-op Plan:   Post-operative Plan:   Informed Consent: I have reviewed the patients History and Physical, chart, labs and discussed the procedure including the risks, benefits and alternatives for the proposed anesthesia with the patient or authorized representative who has indicated his/her understanding and acceptance.     Dental advisory given  Plan Discussed with: CRNA  Anesthesia Plan Comments:         Anesthesia Quick Evaluation

## 2019-10-30 NOTE — Transfer of Care (Signed)
Immediate Anesthesia Transfer of Care Note  Patient: Amy [name]  Procedure(s) Performed: RELEASE RING AND INDEX FINGER TRIGGER FINGERS /A-1 PULLEY (Right Hand)  Patient Location: PACU  Anesthesia Type:MAC and Bier block  Level of Consciousness: sedated and responds to stimulation  Airway & Oxygen Therapy: Patient Spontanous Breathing and Patient connected to face mask oxygen  Post-op Assessment: Report given to RN and Post -op Vital signs reviewed and stable  Post vital signs: Reviewed and stable  Last Vitals:  Vitals Value Taken Time  BP 157/83 10/30/19 1006  Temp    Pulse 88 10/30/19 1007  Resp 19 10/30/19 1007  SpO2 100 % 10/30/19 1007  Vitals shown include unvalidated device data.  Last Pain:  Vitals:   10/30/19 0811  TempSrc: Oral  PainSc: 6       Patients Stated Pain Goal: 6 (31/54/00 8676)  Complications: No apparent anesthesia complications

## 2019-10-30 NOTE — Op Note (Signed)
PATIENT ID:      Amy [name]  MRN:     [id number] DOB/AGE:    [birth year] / 56 y.o.       OPERATIVE REPORT    DATE OF PROCEDURE:  10/30/2019       PREOPERATIVE DIAGNOSIS:   RIGHT RING AND INDEX TRIGGER FINGERS                                                       Estimated body mass index is 30.92 kg/m as calculated from the following:   Height as of this encounter: 5\' 4"  (1.626 m).   Weight as of this encounter: 81.7 kg.     POSTOPERATIVE DIAGNOSIS:   RIGHT RING AND INDEX TRIGGER FINGERS                                                                     Estimated body mass index is 30.92 kg/m as calculated from the following:   Height as of this encounter: 5\' 4"  (1.626 m).   Weight as of this encounter: 81.7 kg.     PROCEDURE:  Procedure(s): RELEASE RING AND INDEX FINGER TRIGGER FINGERS /A-1 PULLEY right     SURGEON:  , MD    ASSISTANT:  none       ANESTHESIA: regional and IV sedation     DRAINS: none :      TOURNIQUET TIME:  Total Tourniquet Time Documented: Forearm (Right) - 23 minutes Total: Forearm (Right) - 23 minutes     COMPLICATIONS:  None   CONDITION:  stable  PROCEDURE IN DETAIL:   [name] 10/30/2019, 10:04 AM

## 2019-10-31 ENCOUNTER — Encounter: Payer: Self-pay | Admitting: *Deleted

## 2019-11-01 ENCOUNTER — Telehealth: Payer: Self-pay

## 2019-11-01 NOTE — Telephone Encounter (Signed)
I tried to call patient. No answer. I was just wanting to check on her since her right hand surgery two days ago. I ask her to give [place] a call with an update.

## 2019-11-02 ENCOUNTER — Other Ambulatory Visit: Payer: Self-pay

## 2019-11-02 ENCOUNTER — Encounter (HOSPITAL_COMMUNITY): Payer: Self-pay

## 2019-11-02 ENCOUNTER — Ambulatory Visit (HOSPITAL_COMMUNITY)
Admission: EM | Admit: 2019-11-02 | Discharge: 2019-11-02 | Disposition: A | Discharge status: Home / Self Care | Payer: BLUE CROSS/BLUE SHIELD | Attending: Emergency Medicine | Admitting: Emergency Medicine

## 2019-11-02 DIAGNOSIS — T7840XA Allergy, unspecified, initial encounter: Secondary | ICD-10-CM | POA: Diagnosis not present

## 2019-11-02 MED ORDER — PREDNISONE 10 MG (21) PO TBPK: ORAL_TABLET | Freq: Every day | ORAL | 0 refills | Status: DC

## 2019-11-02 NOTE — ED Triage Notes (Signed)
Pt is here with an allergic reaction/rash that is presents all over her body, this started Wednesday. Pt has taken benadryl to relieve discomfort.

## 2019-11-02 NOTE — Discharge Instructions (Signed)
Continue with regular use of benadryl to help with your rash and itching.  Complete prednisone pack as prescribed.  If symptoms worsen or do not improve in the next week to return to be seen or to follow up with your PCP.

## 2019-11-02 NOTE — ED Provider Notes (Signed)
[facility]    CSN: [id number] Arrival date & time: 11/02/19  3664      History   Chief Complaint Chief Complaint  Patient presents with  . Allergic Reaction    [name] is a 54 y.o. female.   Amy [name] presents with complaints of itching rash to her arms as well as sores to her scalp with are painful, itching and draining. She used a hair dye to her hair two weeks ago. Scalp developed a few days ago. Has had similar to her scalp in the past which improve with steroids. No pain to arms. She did have hand surgery prior to onset of arm rash and itching. She is taking ibuprofen for pain. Has taken benadryl regularly which isn't necessarily helping.    ROS per HPI, negative if not otherwise mentioned.      Past Medical History:  Diagnosis Date  . Abnormal PFT 01/2018  . Anxiety   . Arthritis   . Brain aneurysm    2-3 mm, stable  . Chronic back pain   . Depression    history of  . Grave's disease 2007   TSH (08/31/2010) = 0.186 (low), free T4 = 0.91 (WNL)  . History of substance abuse (HCC)    remote past  . Hypertension    cardiology consult 01/2018, Dr. Sharyn Lull  . Postsurgical hypothyroidism   . Raynaud disease 2007  . Smoker     Patient Active Problem List   Diagnosis Date Noted  . Trigger finger, right ring finger 09/25/2019  . Trigger finger, right index finger 05/03/2019  . Carpal tunnel syndrome, right upper limb 04/19/2019  . Bilateral carpal tunnel syndrome 12/28/2018  . Abnormal thyroid function test 03/03/2018  . Chronic obstructive pulmonary disease (HCC) 03/03/2018  . Right hip pain 03/03/2018  . Substance abuse (HCC) 03/03/2018  . Preop examination 02/03/2018  . Abnormal PFT 02/03/2018  . Hypothyroidism 02/03/2018  . Spondylolisthesis, lumbar region 01/18/2017  . Cocaine dependence (HCC) 07/25/2015  . 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/25/2015  . Hx of non anemic vitamin B12 deficiency 11/08/2014  . Brain aneurysm  09/09/2014  . Smoker 07/22/2014  . Rhinitis, allergic 07/22/2014  . Chronic low back pain with sciatica 01/24/2013  . Health care maintenance 10/16/2012  . Hot flashes 02/08/2012  . Post-surgical hypothyroidism 12/07/2011  . Fibromyalgia muscle pain 03/31/2011  . Migraine 03/31/2011  . Essential hypertension, benign 11/14/2008  . DEPRESSION 08/10/2006    Past Surgical History:  Procedure Laterality Date  . ABDOMINAL HYSTERECTOMY     due to fibroids, still has ovaries  . CARPAL TUNNEL RELEASE Right 04/19/2019   Procedure: RIGHT CARPAL TUNNEL RELEASE;  Surgeon: Valeria Batman, MD;  Location: [facility];  Service: Orthopedics;  Laterality: Right;  . CARPAL TUNNEL RELEASE Left 05/29/2019   Procedure: LEFT CARPAL TUNNEL RELEASE;  Surgeon: Valeria Batman, MD;  Location: [facility];  Service: Orthopedics;  Laterality: Left;  . COLONOSCOPY  02/14/2008   done by Dr. Christella Hartigan - small colonic polyp identified, 6 mm in size and per biopsy --> tubular adenoma with no malignancy or high grade dysplasia noted  . THYROIDECTOMY  03/08/2012   [facility]; history of Graves' disease with multinodular goiter  . TONSILLECTOMY    . TRIGGER FINGER RELEASE Left 05/29/2019   Procedure: RELEASE TRIGGER FINGER/A-1 PULLEY RING FINGER;  Surgeon: Valeria Batman, MD;  Location: [facility];  Service: Orthopedics;  Laterality: Left;  .  TRIGGER FINGER RELEASE Right 10/30/2019   Procedure: RELEASE RING AND INDEX FINGER TRIGGER FINGERS /A-1 PULLEY;  Surgeon: Garald Balding, MD;  Location: [facility];  Service: Orthopedics;  Laterality: Right;  . WISDOM TOOTH EXTRACTION      OB History    Gravida  3   Para  3   Term  2   Preterm  1   AB  0   Living  2     SAB  0   TAB      Ectopic      Multiple      Live Births               Home Medications    Prior to Admission medications   Medication Sig Start Date End Date  Taking? Authorizing Provider  amitriptyline (ELAVIL) 100 MG tablet Take 1 tablet (100 mg total) by mouth at bedtime. 10/15/19   Nicolette Bang, DO  amLODipine (NORVASC) 10 MG tablet Take 1 tablet (10 mg total) by mouth daily. 05/09/19   Gildardo Pounds, NP  ibuprofen (ADVIL) 800 MG tablet Take 1 tablet (800 mg total) by mouth every 8 (eight) hours as needed. 06/28/19   Cherylann Ratel, PA-C  levothyroxine (SYNTHROID) 100 MCG tablet Take 1 tablet (100 mcg total) by mouth daily. 02/20/19   Scot Jun, FNP  losartan (COZAAR) 100 MG tablet Take 1 tablet (100 mg total) by mouth daily. 05/11/19   Gildardo Pounds, NP  metoprolol succinate (TOPROL-XL) 50 MG 24 hr tablet Take 1 tablet (50 mg total) by mouth daily. 05/11/19   Gildardo Pounds, NP  predniSONE (STERAPRED UNI-PAK 21 TAB) 10 MG (21) TBPK tablet Take by mouth daily. Per box instruction 11/02/19   Zigmund Gottron, NP    Family History Family History  Problem Relation Age of Onset  . Colon cancer Maternal Grandfather   . Cancer Maternal Grandfather        prostate  . Hypertension Mother   . Thyroid disease Mother   . Lupus Mother   . Hypertension Father     Social History Social History   Tobacco Use  . Smoking status: Current Every Day Smoker    Packs/day: 0.25    Years: 35.00    Pack years: 8.75    Types: Cigarettes  . Smokeless tobacco: Never Used  Substance Use Topics  . Alcohol use: No    Alcohol/week: 0.0 standard drinks    Comment: sober for several years  . Drug use: Not Currently    Types: Cocaine, Marijuana    Comment: last used 09/2018, pt in recovery     Allergies   Adhesive [tape], Morphine and related, and Vicodin [hydrocodone-acetaminophen]   Review of Systems Review of Systems   Physical Exam Triage Vital Signs ED Triage Vitals  Enc Vitals Group     BP 11/02/19 0822 (!) 156/107     Pulse Rate 11/02/19 0822 98     Resp 11/02/19 0822 18     Temp 11/02/19 0822 98.7 F (37.1 C)      Temp Source 11/02/19 0822 Oral     SpO2 11/02/19 0822 98 %     Weight 11/02/19 0824 188 lb 6.4 oz (85.5 kg)     Height --      Head Circumference --      Peak Flow --      Pain Score 11/02/19 0823 8     Pain Loc --  Pain Edu? --      Excl. in GC? --    No data found.  Updated Vital Signs BP (!) 156/107 (BP Location: Left Arm) Comment: pt states she did take her BP meds this am  Pulse 98   Temp 98.7 F (37.1 C) (Oral)   Resp 18   Wt 188 lb 6.4 oz (85.5 kg)   LMP 01/26/2000   SpO2 98%   BMI 32.34 kg/m    Physical Exam Constitutional:      General: She is not in acute distress.    Appearance: She is well-developed.  Cardiovascular:     Rate and Rhythm: Normal rate.  Pulmonary:     Effort: Pulmonary effort is normal.  Skin:    General: Skin is warm and dry.     Comments: Some scattered maculopapular red rash to bilateral forearms noted; scalp with yellow crusting throughout and some small scattered papules which are small  Neurological:     Mental Status: She is alert and oriented to person, place, and time.      UC Treatments / Results  Labs (all labs ordered are listed, but only abnormal results are displayed) Labs Reviewed - No data to display  EKG   Radiology No results found.  Procedures Procedures (including critical care time)  Medications Ordered in UC Medications - No data to display  Initial Impression / Assessment and Plan / UC Course  I have reviewed the triage vital signs and the nursing notes.  Pertinent labs & imaging results that were available during my care of the patient were reviewed by me and considered in my medical decision making (see chart for details).     Allergic response from hand surgery causing itching rash to forearm likely. Appears that rashes are two separate problems. Scalp - dermatitis vs infectious source. Did use a hair dye prior to onset and has had similar reaction to scalp in the past. Opted to provided  steroid pack today with return precautions provided. Continue with benadryl. Patient verbalized understanding and agreeable to plan.   Final Clinical Impressions(s) / UC Diagnoses   Final diagnoses:  Allergic reaction, initial encounter     Discharge Instructions     Continue with regular use of benadryl to help with your rash and itching.  Complete prednisone pack as prescribed.  If symptoms worsen or do not improve in the next week to return to be seen or to follow up with your PCP.     ED Prescriptions    Medication Sig Dispense Auth. Provider   predniSONE (STERAPRED UNI-PAK 21 TAB) 10 MG (21) TBPK tablet Take by mouth daily. Per box instruction 21 tablet Georgetta Haber, NP     PDMP not reviewed this encounter.   Georgetta Haber, NP 11/02/19 1906

## 2019-11-06 ENCOUNTER — Inpatient Hospital Stay: Payer: BLUE CROSS/BLUE SHIELD | Admitting: Orthopaedic Surgery

## 2019-11-07 ENCOUNTER — Encounter: Payer: Self-pay | Admitting: Orthopaedic Surgery

## 2019-11-07 ENCOUNTER — Ambulatory Visit (INDEPENDENT_AMBULATORY_CARE_PROVIDER_SITE_OTHER): Payer: BLUE CROSS/BLUE SHIELD | Admitting: Orthopaedic Surgery

## 2019-11-07 ENCOUNTER — Other Ambulatory Visit: Payer: Self-pay

## 2019-11-07 VITALS — Ht 64.0 in | Wt 188.0 lb

## 2019-11-07 DIAGNOSIS — M65341 Trigger finger, right ring finger: Secondary | ICD-10-CM

## 2019-11-07 DIAGNOSIS — M65321 Trigger finger, right index finger: Secondary | ICD-10-CM

## 2019-11-07 NOTE — Progress Notes (Signed)
Office Visit Note   Patient: Amy [name]           Date of Birth: 1966/02/06           MRN: [id number] Visit Date: 11/07/2019              Requested by: Arvilla Market, DO [address] [place],  [place] [address] PCP: Arvilla Market, DO   Assessment & Plan: Visit Diagnoses:  1. Trigger finger, right index finger   2. Trigger finger, right ring finger     Plan: 8 days status post release of right ring and index trigger fingers.  Doing very well.  Was unable to fully flex her fingers preoperatively and now can make a full fist.  Stitches removed.  Neurologically intact.  Return in 2 weeks  Follow-Up Instructions: Return in about 2 weeks (around 11/21/2019).   Orders:  No orders of the defined types were placed in this encounter.  No orders of the defined types were placed in this encounter.     Procedures: No procedures performed   Clinical Data: No additional findings.   Subjective: Chief Complaint  Patient presents with  . Right Hand - Routine Post Op    Right ring and index finger trigger release DOS 10/30/2019  Patient presents today for follow up on her right hand. She had her right ring and index trigger fingers released on 10/30/2019. She is now 8 days out from surgery and doing well. She said that she can move her fingers well and has no numbness. She is not taking anything for pain.  HPI  Review of Systems   Objective: Vital Signs: Ht 5\' 4"  (1.626 m)   Wt 188 lb (85.3 kg)   LMP 01/26/2000   BMI 32.27 kg/m   Physical Exam  Ortho Exam right hand with well-healed incisions for release of the index and ring trigger fingers.  Neurologically intact.  No swelling of the digits.  Able to make a full fist and full extension.  No active triggering  Specialty Comments:  No specialty comments available.  Imaging: No results found.   PMFS History: Patient Active Problem List   Diagnosis Date Noted  . Trigger finger, right ring  finger 09/25/2019  . Trigger finger, right index finger 05/03/2019  . Carpal tunnel syndrome, right upper limb 04/19/2019  . Bilateral carpal tunnel syndrome 12/28/2018  . Abnormal thyroid function test 03/03/2018  . Chronic obstructive pulmonary disease (HCC) 03/03/2018  . Right hip pain 03/03/2018  . Substance abuse (HCC) 03/03/2018  . Preop examination 02/03/2018  . Abnormal PFT 02/03/2018  . Hypothyroidism 02/03/2018  . Spondylolisthesis, lumbar region 01/18/2017  . Cocaine dependence (HCC) 07/25/2015  . 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/25/2015  . Hx of non anemic vitamin B12 deficiency 11/08/2014  . Brain aneurysm 09/09/2014  . Smoker 07/22/2014  . Rhinitis, allergic 07/22/2014  . Chronic low back pain with sciatica 01/24/2013  . Health care maintenance 10/16/2012  . Hot flashes 02/08/2012  . Post-surgical hypothyroidism 12/07/2011  . Fibromyalgia muscle pain 03/31/2011  . Migraine 03/31/2011  . Essential hypertension, benign 11/14/2008  . DEPRESSION 08/10/2006   Past Medical History:  Diagnosis Date  . Abnormal PFT 01/2018  . Anxiety   . Arthritis   . Brain aneurysm    2-3 mm, stable  . Chronic back pain   . Depression    history of  . Grave's disease 2007   TSH (08/31/2010) = 0.186 (low), free T4 =  0.91 (WNL)  . History of substance abuse ([place])    remote past  . Hypertension    cardiology consult 01/2018, Dr. Terrence Dupont  . Postsurgical hypothyroidism   . Raynaud disease 2007  . Smoker     Family History  Problem Relation Age of Onset  . Colon cancer Maternal Grandfather   . Cancer Maternal Grandfather        prostate  . Hypertension Mother   . Thyroid disease Mother   . Lupus Mother   . Hypertension Father     Past Surgical History:  Procedure Laterality Date  . ABDOMINAL HYSTERECTOMY     due to fibroids, still has ovaries  . CARPAL TUNNEL RELEASE Right 04/19/2019   Procedure: RIGHT CARPAL TUNNEL RELEASE;  Surgeon: Garald Balding, MD;   Location: [facility];  Service: Orthopedics;  Laterality: Right;  . CARPAL TUNNEL RELEASE Left 05/29/2019   Procedure: LEFT CARPAL TUNNEL RELEASE;  Surgeon: Garald Balding, MD;  Location: [facility];  Service: Orthopedics;  Laterality: Left;  . COLONOSCOPY  02/14/2008   done by Dr. Ardis Hughs - small colonic polyp identified, 6 mm in size and per biopsy --> tubular adenoma with no malignancy or high grade dysplasia noted  . THYROIDECTOMY  03/08/2012   [facility]; history of Graves' disease with multinodular goiter  . TONSILLECTOMY    . TRIGGER FINGER RELEASE Left 05/29/2019   Procedure: RELEASE TRIGGER FINGER/A-1 PULLEY RING FINGER;  Surgeon: Garald Balding, MD;  Location: [facility];  Service: Orthopedics;  Laterality: Left;  . TRIGGER FINGER RELEASE Right 10/30/2019   Procedure: RELEASE RING AND INDEX FINGER TRIGGER FINGERS /A-1 PULLEY;  Surgeon: Garald Balding, MD;  Location: [facility];  Service: Orthopedics;  Laterality: Right;  . WISDOM TOOTH EXTRACTION     Social History   Occupational History  . Not on file  Tobacco Use  . Smoking status: Current Every Day Smoker    Packs/day: 0.25    Years: 35.00    Pack years: 8.75    Types: Cigarettes  . Smokeless tobacco: Never Used  Substance and Sexual Activity  . Alcohol use: No    Alcohol/week: 0.0 standard drinks    Comment: sober for several years  . Drug use: Not Currently    Types: Cocaine, Marijuana    Comment: last used 09/2018, pt in recovery  . Sexual activity: Not on file

## 2019-11-26 ENCOUNTER — Other Ambulatory Visit: Payer: Self-pay

## 2019-11-26 ENCOUNTER — Other Ambulatory Visit (INDEPENDENT_AMBULATORY_CARE_PROVIDER_SITE_OTHER): Payer: BC Managed Care – PPO

## 2019-11-26 ENCOUNTER — Ambulatory Visit: Payer: BLUE CROSS/BLUE SHIELD | Admitting: Internal Medicine

## 2019-11-26 DIAGNOSIS — E039 Hypothyroidism, unspecified: Secondary | ICD-10-CM

## 2019-11-26 NOTE — Progress Notes (Signed)
Patient here for repeat thyroid labs. 

## 2019-11-27 ENCOUNTER — Other Ambulatory Visit: Payer: Self-pay | Admitting: Internal Medicine

## 2019-11-27 DIAGNOSIS — E890 Postprocedural hypothyroidism: Secondary | ICD-10-CM

## 2019-11-27 LAB — THYROID PANEL WITH TSH
Free Thyroxine Index: 1.7 (ref 1.2–4.9)
T3 Uptake Ratio: 23 % — ABNORMAL LOW (ref 24–39)
T4, Total: 7.5 ug/dL (ref 4.5–12.0)
TSH: 0.299 u[IU]/mL — ABNORMAL LOW (ref 0.450–4.500)

## 2019-11-27 MED ORDER — LEVOTHYROXINE SODIUM 88 MCG PO TABS: 88.0000 ug | ORAL_TABLET | Freq: Every day | ORAL | 0 refills | Status: DC

## 2019-11-27 NOTE — Progress Notes (Signed)
Patient notified of results & recommendations. Expressed understanding. Scheduled lab appointment for 01/07/2020.

## 2019-12-14 ENCOUNTER — Telehealth: Payer: Self-pay

## 2019-12-14 NOTE — Telephone Encounter (Signed)

## 2019-12-17 ENCOUNTER — Ambulatory Visit (INDEPENDENT_AMBULATORY_CARE_PROVIDER_SITE_OTHER): Payer: BLUE CROSS/BLUE SHIELD | Admitting: Internal Medicine

## 2019-12-17 ENCOUNTER — Encounter: Payer: Self-pay | Admitting: Gastroenterology

## 2019-12-17 ENCOUNTER — Other Ambulatory Visit: Payer: Self-pay

## 2019-12-17 ENCOUNTER — Encounter: Payer: Self-pay | Admitting: Internal Medicine

## 2019-12-17 VITALS — BP 182/114 | HR 87 | Temp 97.3°F | Resp 17 | Wt 182.0 lb

## 2019-12-17 DIAGNOSIS — I10 Essential (primary) hypertension: Secondary | ICD-10-CM | POA: Diagnosis not present

## 2019-12-17 DIAGNOSIS — L309 Dermatitis, unspecified: Secondary | ICD-10-CM

## 2019-12-17 DIAGNOSIS — Z1211 Encounter for screening for malignant neoplasm of colon: Secondary | ICD-10-CM | POA: Diagnosis not present

## 2019-12-17 MED ORDER — PREDNISONE 50 MG PO TABS: ORAL_TABLET | ORAL | 0 refills | Status: DC

## 2019-12-17 MED ORDER — TRIAMCINOLONE ACETONIDE 0.1 % EX CREA: 1.0000 "application " | TOPICAL_CREAM | Freq: Two times a day (BID) | CUTANEOUS | 0 refills | Status: DC

## 2019-12-17 MED ORDER — METOPROLOL SUCCINATE ER 100 MG PO TB24: 100.0000 mg | ORAL_TABLET | Freq: Every day | ORAL | 3 refills | Status: AC

## 2019-12-17 MED ORDER — HYDROXYZINE HCL 10 MG PO TABS: 10.0000 mg | ORAL_TABLET | Freq: Three times a day (TID) | ORAL | 0 refills | Status: DC | PRN

## 2019-12-17 NOTE — Progress Notes (Signed)
Subjective:    Amy [name] - 54 y.o. female MRN [id number]  Date of birth: May 21, 1966  HPI  Amy [name] is here for HTN f/u.  Chronic HTN Disease Monitoring:  Home BP Monitoring - Does monitor at home. Has not checked at home in the last couple days. At home typically 170-180/100s.  Chest pain- no  Dyspnea- no Headache - no  Medications: Metoprolol 50 mg daily, Losartan 100 mg daily. Amlodipine 10 mg daily  Compliance- yes Lightheadedness- no  Edema- no   Skin Rash: Has been present for 1-2 weeks on arms and forehead. Very itchy. No history of eczema. No change in soaps, detergents, lotions etc. No close contacts with same rash.  Has tried cortisone 10 cream and benadryl cream. Also taking Benadryl PO without any relief. Itching is keeping her up at night.       Health Maintenance:  Health Maintenance Due  Topic Date Due  . COLONOSCOPY  02/13/2013  . MAMMOGRAM  09/03/2017    -  reports that she has been smoking cigarettes. She has a 8.75 pack-year smoking history. She has never used smokeless tobacco. - Review of Systems: Per HPI. - Past Medical History: Patient Active Problem List   Diagnosis Date Noted  . Trigger finger, right ring finger 09/25/2019  . Trigger finger, right index finger 05/03/2019  . Carpal tunnel syndrome, right upper limb 04/19/2019  . Bilateral carpal tunnel syndrome 12/28/2018  . Abnormal thyroid function test 03/03/2018  . Chronic obstructive pulmonary disease ([place]) 03/03/2018  . Right hip pain 03/03/2018  . Substance abuse ([place]) 03/03/2018  . Preop examination 02/03/2018  . Abnormal PFT 02/03/2018  . Hypothyroidism 02/03/2018  . Spondylolisthesis, lumbar region 01/18/2017  . Cocaine dependence ([place]) 07/25/2015  . Cocaine-induced mood disorder ([place]) 07/25/2015  . Hx of non anemic vitamin B12 deficiency 11/08/2014  . Brain aneurysm 09/09/2014  . Smoker 07/22/2014  . Rhinitis, allergic 07/22/2014  . Chronic low back  pain with sciatica 01/24/2013  . Health care maintenance 10/16/2012  . Hot flashes 02/08/2012  . Post-surgical hypothyroidism 12/07/2011  . Fibromyalgia muscle pain 03/31/2011  . Migraine 03/31/2011  . Essential hypertension, benign 11/14/2008  . DEPRESSION 08/10/2006   - Medications: reviewed and updated   Objective:   Physical Exam BP (!) 182/114   Pulse 87   Temp (!) 97.3 F (36.3 C) (Temporal)   Resp 17   Wt 182 lb (82.6 kg)   LMP 01/26/2000   SpO2 95%   BMI 31.24 kg/m  Physical Exam  Constitutional: She is oriented to person, place, and time and well-developed, well-nourished, and in no distress. No distress.  Cardiovascular: Normal rate.  Pulmonary/Chest: Effort normal. No respiratory distress.  Musculoskeletal:        General: Normal range of motion.  Neurological: She is alert and oriented to person, place, and time.  Skin: Skin is warm and dry. She is not diaphoretic.  Maculopapular rash diffusely present across UE and face.   Psychiatric: Affect and judgment normal.           Assessment & Plan:  1. Essential hypertension BP not at goal today. Will increase Metoprolol dose as HR has room to tolerate. No red flag symptoms. Return in 2 weeks for BP check with CMA. Counseled on blood pressure goal of less than 130/80, low-sodium, DASH diet, medication compliance, 150 minutes of moderate intensity exercise per week. Discussed medication compliance, adverse effects. - metoprolol succinate (TOPROL-XL) 100 MG 24 hr tablet; Take 1  tablet (100 mg total) by mouth daily. Take with or immediately following a meal.  Dispense: 90 tablet; Refill: 3  2. Colon cancer screening - Ambulatory referral to Gastroenterology  3. Dermatitis Given diffuse presentation with significant pruritis, will prescribe burst of PO steroid as well as steroid cream. OTC anti histamines have not helped symptoms so will trial Hydroxyzine.  - predniSONE (DELTASONE) 50 MG tablet; Take daily for  five days.  Dispense: 5 tablet; Refill: 0 - triamcinolone cream (KENALOG) 0.1 %; Apply 1 application topically 2 (two) times daily.  Dispense: 30 g; Refill: 0 - hydrOXYzine (ATARAX/VISTARIL) 10 MG tablet; Take 1 tablet (10 mg total) by mouth 3 (three) times daily as needed.  Dispense: 30 tablet; Refill: 0     Amy [name], D.O. 12/17/2019, 9:13 AM Primary Care at [facility]

## 2019-12-31 ENCOUNTER — Ambulatory Visit (INDEPENDENT_AMBULATORY_CARE_PROVIDER_SITE_OTHER): Payer: BLUE CROSS/BLUE SHIELD

## 2019-12-31 ENCOUNTER — Other Ambulatory Visit: Payer: Self-pay

## 2019-12-31 VITALS — BP 129/83 | HR 92

## 2019-12-31 DIAGNOSIS — I10 Essential (primary) hypertension: Secondary | ICD-10-CM | POA: Diagnosis not present

## 2019-12-31 NOTE — Progress Notes (Signed)
Patient here for BP check. After sitting BP was 129/83, pulse was 92. Will route chart to PCP for follow up instructions.Marland Kitchen KWalker, CMA.

## 2020-01-07 ENCOUNTER — Other Ambulatory Visit (INDEPENDENT_AMBULATORY_CARE_PROVIDER_SITE_OTHER): Payer: BLUE CROSS/BLUE SHIELD

## 2020-01-07 ENCOUNTER — Other Ambulatory Visit: Payer: Self-pay

## 2020-01-07 DIAGNOSIS — E890 Postprocedural hypothyroidism: Secondary | ICD-10-CM | POA: Diagnosis not present

## 2020-01-08 LAB — TSH+T4F+T3FREE
Free T4: 0.99 ng/dL (ref 0.82–1.77)
T3, Free: 2.6 pg/mL (ref 2.0–4.4)
TSH: 1.48 u[IU]/mL (ref 0.450–4.500)

## 2020-01-08 NOTE — Progress Notes (Signed)
Normal lab letter mailed.

## 2020-02-13 ENCOUNTER — Encounter: Payer: BLUE CROSS/BLUE SHIELD | Admitting: Gastroenterology

## 2020-03-04 ENCOUNTER — Other Ambulatory Visit: Payer: Self-pay

## 2020-03-04 ENCOUNTER — Emergency Department (HOSPITAL_COMMUNITY)
Admission: EM | Admit: 2020-03-04 | Discharge: 2020-03-04 | Disposition: A | Discharge status: Home / Self Care | Payer: BLUE CROSS/BLUE SHIELD | Attending: Emergency Medicine | Admitting: Emergency Medicine

## 2020-03-04 ENCOUNTER — Encounter (HOSPITAL_COMMUNITY): Payer: Self-pay | Admitting: Emergency Medicine

## 2020-03-04 ENCOUNTER — Emergency Department (HOSPITAL_COMMUNITY): Payer: BLUE CROSS/BLUE SHIELD

## 2020-03-04 DIAGNOSIS — E039 Hypothyroidism, unspecified: Secondary | ICD-10-CM | POA: Diagnosis not present

## 2020-03-04 DIAGNOSIS — I10 Essential (primary) hypertension: Secondary | ICD-10-CM | POA: Diagnosis not present

## 2020-03-04 DIAGNOSIS — Z79899 Other long term (current) drug therapy: Secondary | ICD-10-CM | POA: Diagnosis not present

## 2020-03-04 DIAGNOSIS — R062 Wheezing: Secondary | ICD-10-CM | POA: Insufficient documentation

## 2020-03-04 DIAGNOSIS — F17210 Nicotine dependence, cigarettes, uncomplicated: Secondary | ICD-10-CM | POA: Insufficient documentation

## 2020-03-04 DIAGNOSIS — J069 Acute upper respiratory infection, unspecified: Secondary | ICD-10-CM | POA: Diagnosis not present

## 2020-03-04 DIAGNOSIS — R0789 Other chest pain: Secondary | ICD-10-CM

## 2020-03-04 DIAGNOSIS — R05 Cough: Secondary | ICD-10-CM | POA: Diagnosis not present

## 2020-03-04 DIAGNOSIS — R0602 Shortness of breath: Secondary | ICD-10-CM

## 2020-03-04 DIAGNOSIS — R059 Cough, unspecified: Secondary | ICD-10-CM

## 2020-03-04 LAB — TROPONIN I (HIGH SENSITIVITY)
Troponin I (High Sensitivity): 3 ng/L (ref <18)
Troponin I (High Sensitivity): 2 ng/L (ref <18)

## 2020-03-04 LAB — I-STAT BETA HCG BLOOD, ED (MC, WL, AP ONLY): I-stat hCG, quantitative: <5 m[IU]/mL (ref <5)

## 2020-03-04 LAB — BASIC METABOLIC PANEL
Anion gap: 7 (ref 5–15)
BUN: 12 mg/dL (ref 6–20)
CO2: 28 mmol/L (ref 22–32)
Calcium: 9.1 mg/dL (ref 8.9–10.3)
Chloride: 105 mmol/L (ref 98–111)
Creatinine, Ser: 0.64 mg/dL (ref 0.44–1.00)
GFR calc Af Amer: >60 mL/min (ref >60)
GFR calc non Af Amer: >60 mL/min (ref >60)
Glucose, Bld: 103 mg/dL — ABNORMAL HIGH (ref 70–99)
Potassium: 4.3 mmol/L (ref 3.5–5.1)
Sodium: 140 mmol/L (ref 135–145)

## 2020-03-04 LAB — CBC
HCT: 39.5 % (ref 36.0–46.0)
Hemoglobin: 12.5 g/dL (ref 12.0–15.0)
MCH: 30.1 pg (ref 26.0–34.0)
MCHC: 31.6 g/dL (ref 30.0–36.0)
MCV: 95.2 fL (ref 80.0–100.0)
Platelets: 239 10*3/uL (ref 150–400)
RBC: 4.15 MIL/uL (ref 3.87–5.11)
RDW: 12.2 % (ref 11.5–15.5)
WBC: 6 10*3/uL (ref 4.0–10.5)
nRBC: 0 % (ref 0.0–0.2)

## 2020-03-04 MED ORDER — SODIUM CHLORIDE 0.9% FLUSH
3.0000 mL | Freq: Once | INTRAVENOUS | Status: AC
  Administered 2020-03-04: 3 mL via INTRAVENOUS

## 2020-03-04 MED ORDER — ALBUTEROL SULFATE HFA 108 (90 BASE) MCG/ACT IN AERS
2.0000 | INHALATION_SPRAY | Freq: Once | RESPIRATORY_TRACT | Status: AC
  Administered 2020-03-04: 2 via RESPIRATORY_TRACT
  Filled 2020-03-04: qty 6.7

## 2020-03-04 NOTE — Discharge Instructions (Signed)
Your x-ray today did not show pneumonia but I am concerned he may have a viral upper respiratory infection causing the fever, runny nose, congestion, and cough.  Your labs are also reassuring and I have a low suspicion that you are having a cardiac cause of your chest discomfort.  I suspect you have some chest wall pain due to the coughing.  We also heard wheezing for which we gave you albuterol and help with this.  Please use this with 2 puffs every 4 -6 hours if needed.  Please follow-up with your primary doctor.  We discussed getting a blood test to rule out a blood clot however we are unable to get that collected today.  If any symptoms change or worsen, please return to the nearest emergency department.

## 2020-03-04 NOTE — ED Triage Notes (Signed)
Patient reports central chest pain with SOB , productive cough and chest congestion onset Sunday , denies emesis or diaphoresis .

## 2020-03-04 NOTE — ED Provider Notes (Signed)
[facility] EMERGENCY DEPARTMENT Provider Note   CSN: [id number] Arrival date & time: 03/04/20  0645     History Chief Complaint  Patient presents with  . Chest Pain    Amy [name] is a 54 y.o. female.  The history is provided by the patient and medical records. No language interpreter was used.  Chest Pain Pain location:  Substernal area and L chest Pain quality: aching and sharp   Pain radiates to:  Does not radiate Pain severity:  Moderate Onset quality:  Gradual Duration:  2 days Timing:  Constant Progression:  Waxing and waning Chronicity:  New Context: not drug use   Worsened by:  Coughing and deep breathing Ineffective treatments:  None tried Associated symptoms: cough, fatigue, fever and shortness of breath   Associated symptoms: no abdominal pain, no altered mental status, no anxiety, no back pain, no claudication, no diaphoresis, no dizziness, no headache, no lower extremity edema, no nausea, no near-syncope, no numbness, no palpitations, no vomiting and no weakness        Past Medical History:  Diagnosis Date  . Abnormal PFT 01/2018  . Anxiety   . Arthritis   . Brain aneurysm    2-3 mm, stable  . Chronic back pain   . Depression    history of  . Grave's disease 2007   TSH (08/31/2010) = 0.186 (low), free T4 = 0.91 (WNL)  . History of substance abuse ([place])    remote past  . Hypertension    cardiology consult 01/2018, Dr. Terrence Dupont  . Postsurgical hypothyroidism   . Raynaud disease 2007  . Smoker     Patient Active Problem List   Diagnosis Date Noted  . Trigger finger, right ring finger 09/25/2019  . Trigger finger, right index finger 05/03/2019  . Carpal tunnel syndrome, right upper limb 04/19/2019  . Bilateral carpal tunnel syndrome 12/28/2018  . Abnormal thyroid function test 03/03/2018  . Chronic obstructive pulmonary disease ([place]) 03/03/2018  . Right hip pain 03/03/2018  . Substance abuse ([place]) 03/03/2018  . Preop  examination 02/03/2018  . Abnormal PFT 02/03/2018  . Hypothyroidism 02/03/2018  . Spondylolisthesis, lumbar region 01/18/2017  . Cocaine dependence ([place]) 07/25/2015  . Cocaine-induced mood disorder ([place]) 07/25/2015  . Hx of non anemic vitamin B12 deficiency 11/08/2014  . Brain aneurysm 09/09/2014  . Smoker 07/22/2014  . Rhinitis, allergic 07/22/2014  . Chronic low back pain with sciatica 01/24/2013  . Health care maintenance 10/16/2012  . Hot flashes 02/08/2012  . Post-surgical hypothyroidism 12/07/2011  . Fibromyalgia muscle pain 03/31/2011  . Migraine 03/31/2011  . Essential hypertension, benign 11/14/2008  . DEPRESSION 08/10/2006    Past Surgical History:  Procedure Laterality Date  . ABDOMINAL HYSTERECTOMY     due to fibroids, still has ovaries  . CARPAL TUNNEL RELEASE Right 04/19/2019   Procedure: RIGHT CARPAL TUNNEL RELEASE;  Surgeon: Garald Balding, MD;  Location: [facility];  Service: Orthopedics;  Laterality: Right;  . CARPAL TUNNEL RELEASE Left 05/29/2019   Procedure: LEFT CARPAL TUNNEL RELEASE;  Surgeon: Garald Balding, MD;  Location: [place];  Service: Orthopedics;  Laterality: Left;  . COLONOSCOPY  02/14/2008   done by Dr. Ardis Hughs - small colonic polyp identified, 6 mm in size and per biopsy --> tubular adenoma with no malignancy or high grade dysplasia noted  . THYROIDECTOMY  03/08/2012   [facility]; history of Graves' disease with multinodular goiter  . TONSILLECTOMY    .  TRIGGER FINGER RELEASE Left 05/29/2019   Procedure: RELEASE TRIGGER FINGER/A-1 PULLEY RING FINGER;  Surgeon: Valeria Batman, MD;  Location: [facility];  Service: Orthopedics;  Laterality: Left;  . TRIGGER FINGER RELEASE Right 10/30/2019   Procedure: RELEASE RING AND INDEX FINGER TRIGGER FINGERS /A-1 PULLEY;  Surgeon: Valeria Batman, MD;  Location: [facility];  Service: Orthopedics;  Laterality: Right;  . WISDOM TOOTH  EXTRACTION       OB History    Gravida  3   Para  3   Term  2   Preterm  1   AB  0   Living  2     SAB  0   TAB      Ectopic      Multiple      Live Births              Family History  Problem Relation Age of Onset  . Colon cancer Maternal Grandfather   . Cancer Maternal Grandfather        prostate  . Hypertension Mother   . Thyroid disease Mother   . Lupus Mother   . Hypertension Father     Social History   Tobacco Use  . Smoking status: Current Every Day Smoker    Packs/day: 0.25    Years: 35.00    Pack years: 8.75    Types: Cigarettes  . Smokeless tobacco: Never Used  Vaping Use  . Vaping Use: Never used  Substance Use Topics  . Alcohol use: No    Alcohol/week: 0.0 standard drinks    Comment: sober for several years  . Drug use: Not Currently    Types: Cocaine, Marijuana    Comment: last used 09/2018, pt in recovery    Home Medications Prior to Admission medications   Medication Sig Start Date End Date Taking? Authorizing Provider  amitriptyline (ELAVIL) 100 MG tablet Take 1 tablet (100 mg total) by mouth at bedtime. 10/15/19   Arvilla Market, DO  amLODipine (NORVASC) 10 MG tablet Take 1 tablet (10 mg total) by mouth daily. 05/09/19   Claiborne Rigg, NP  levothyroxine (SYNTHROID) 88 MCG tablet Take 1 tablet (88 mcg total) by mouth daily. 11/27/19   Arvilla Market, DO  losartan (COZAAR) 100 MG tablet Take 1 tablet (100 mg total) by mouth daily. 05/11/19   Claiborne Rigg, NP  metoprolol succinate (TOPROL-XL) 100 MG 24 hr tablet Take 1 tablet (100 mg total) by mouth daily. Take with or immediately following a meal. 12/17/19   Arvilla Market, DO    Allergies    Adhesive [tape], Morphine and related, and Vicodin [hydrocodone-acetaminophen]  Review of Systems   Review of Systems  Constitutional: Positive for chills, fatigue and fever. Negative for diaphoresis.  HENT: Positive for congestion.   Eyes: Negative  for visual disturbance.  Respiratory: Positive for cough, chest tightness and shortness of breath. Negative for stridor.   Cardiovascular: Positive for chest pain. Negative for palpitations, claudication and near-syncope.  Gastrointestinal: Negative for abdominal pain, constipation, diarrhea, nausea and vomiting.  Genitourinary: Positive for dysuria.  Musculoskeletal: Negative for back pain, neck pain and neck stiffness.  Neurological: Negative for dizziness, weakness, light-headedness, numbness and headaches.  Psychiatric/Behavioral: Negative for agitation and confusion.  All other systems reviewed and are negative.   Physical Exam Updated Vital Signs BP 120/77 (BP Location: Right Arm)   Pulse 88   Temp 98.1 F (36.7 C) (Oral)   Resp Marland Kitchen)  22   Ht 5\' 4"  (1.626 m)   Wt 92 kg   LMP 01/26/2000   SpO2 100%   BMI 34.81 kg/m   Physical Exam Vitals and nursing note reviewed.  Constitutional:      General: She is not in acute distress.    Appearance: She is well-developed. She is not ill-appearing, toxic-appearing or diaphoretic.  HENT:     Head: Normocephalic and atraumatic.  Eyes:     Extraocular Movements: Extraocular movements intact.     Conjunctiva/sclera: Conjunctivae normal.     Pupils: Pupils are equal, round, and reactive to light.  Cardiovascular:     Rate and Rhythm: Normal rate and regular rhythm.     Heart sounds: Normal heart sounds. No murmur heard.   Pulmonary:     Effort: Pulmonary effort is normal. Tachypnea present. No respiratory distress.     Breath sounds: Wheezing present. No decreased breath sounds, rhonchi or rales.  Abdominal:     Palpations: Abdomen is soft.     Tenderness: There is no abdominal tenderness.  Musculoskeletal:     Cervical back: Neck supple.     Right lower leg: No tenderness. No edema.     Left lower leg: No tenderness. No edema.  Skin:    General: Skin is warm and dry.     Capillary Refill: Capillary refill takes less than 2  seconds.     Findings: No erythema.  Neurological:     General: No focal deficit present.     Mental Status: She is alert.  Psychiatric:        Mood and Affect: Mood normal. Mood is not anxious.     ED Results / Procedures / Treatments   Labs (all labs ordered are listed, but only abnormal results are displayed) Labs Reviewed  BASIC METABOLIC PANEL - Abnormal; Notable for the following components:      Result Value   Glucose, Bld 103 (*)    All other components within normal limits  URINE CULTURE  CBC  URINALYSIS, ROUTINE W REFLEX MICROSCOPIC  D-DIMER, QUANTITATIVE (NOT AT [facility])  I-STAT BETA HCG BLOOD, ED ([facility], [facility], [facility] ONLY)  TROPONIN I (HIGH SENSITIVITY)  TROPONIN I (HIGH SENSITIVITY)    EKG EKG Interpretation  Date/Time:  Tuesday March 04 2020 07:00:09 EDT Ventricular Rate:  96 PR Interval:  158 QRS Duration: 74 QT Interval:  360 QTC Calculation: 454 R Axis:   78 Text Interpretation: Normal sinus rhythm Normal ECG When compared to prior, no significant changes seen. No STEMI Confirmed by 07-24-2002 (Theda Belfast) on 03/04/2020 10:07:35 AM   Radiology DG Chest 2 View  Result Date: 03/04/2020 CLINICAL DATA:  Chest pain. EXAM: CHEST - 2 VIEW COMPARISON:  October 28, 2018. FINDINGS: The heart size and mediastinal contours are within normal limits. Both lungs are clear. No pneumothorax or pleural effusion is noted. The visualized skeletal structures are unremarkable. IMPRESSION: No active cardiopulmonary disease. Electronically Signed   By: October 30, 2018 M.D.   On: 03/04/2020 09:03    Procedures Procedures (including critical care time)  Medications Ordered in ED Medications  sodium chloride flush (NS) 0.9 % injection 3 mL (3 mLs Intravenous Given 03/04/20 1013)  albuterol (VENTOLIN HFA) 108 (90 Base) MCG/ACT inhaler 2 puff (2 puffs Inhalation Given 03/04/20 1054)    ED Course  I have reviewed the triage vital signs and the nursing notes.  Pertinent labs & imaging  results that were available during my care of the patient were  reviewed by me and considered in my medical decision making (see chart for details).    MDM Rules/Calculators/A&P                          Amy [name] is a 54 y.o. female with a past medical history significant for fibromyalgia, hypertension, migraines, prior brain aneurysm, previous polysubstance abuse who reports has been clean for 16 months, COPD, hypothyroidism, and Raynaud's disease who presents with fevers, chills, productive cough, chest pain, shortness of breath.  Patient reports that over the last 3 days, she has been having these URI symptoms with cough and shortness of breath.  She reports that she had Covid 6 months ago in December and then had her Covid vaccination finally this past month.  She reports that her second Pfizer Covid vaccine was on Thursday, last week.  She says that over the weekend she started having some fevers and chills and on Saturday had a fever of 103.  She reports has had productive cough with some shortness of breath.  She reports she developed some left-sided chest discomfort with coughing and deep breathing.  She is never had this before.  She reports she has had pneumonia in the past and is concerned about that.  She reports no nausea, vomiting, constipation, or diarrhea.  She does report some dysuria mildly.  She denies abdominal pain.  She denies trauma.  She reports feeling somewhat better today but wanted to get evaluated.  On exam, lungs had some wheezing but otherwise there were no rhonchi or rales.  No murmur.  Chest was tender to palpation in the central chest and left chest.  This reproduce her discomfort.  Abdomen was nontender.  Good pulses in all extremities.  No lower extremity tenderness or edema appreciated.  Good pulses in lower extremities.  EKG shows no STEMI.  Clinically I suspect patient is having a URI causing cough causing a musculoskeletal chest wall pain.  With the wheezing,  she will be given several puffs of albuterol.  She will have chest x-ray and labs.  We will include a delta troponin as well as a D-dimer given the previous Covid and recent vaccination to rule out pulmonary embolism given her age and inability to do the [facility] rule.  Chest x-ray shows no pneumonia.  CBC shows no leukocytosis or anemia.  BMP reassuring.  Initial troponin is negative, awaiting delta.  Awaiting D-dimer.  Awaiting urinalysis.  If patient is feeling better and work-up is reassuring, Anticipate discharge home for outpatient follow-up.        Patient was feeling much better after the albuterol.  She does not want to wait for a D-dimer.  Delta troponin was negative.  She would like to go home.  Patient understands that we have not ruled out DVT or PE but she would like to go home.  She understands follow-up with a PCP and return precautions.  Clinically I suspect URI leading to chest wall pain after the coughing and wheezing.  She had no other questions or concerns and patient was discharged in good condition.  Final Clinical Impression(s) / ED Diagnoses Final diagnoses:  Wheezing  Viral upper respiratory tract infection  Other chest pain  Shortness of breath  Cough     Clinical Impression: 1. Wheezing   2. Viral upper respiratory tract infection   3. Other chest pain   4. Shortness of breath   5. Cough     Disposition:  Discharge  Condition: Good  I have discussed the results, Dx and Tx plan with the pt(& family if present). He/she/they expressed understanding and agree(s) with the plan. Discharge instructions discussed at great length. Strict return precautions discussed and pt &/or family have verbalized understanding of the instructions. No further questions at time of discharge.    Discharge Medication List as of 03/04/2020 12:18 PM      Follow Up: Arvilla MarketWallace, Catherine Lauren, DO [address] [place] [id number] [phone]     [facility]  EMERGENCY DEPARTMENT [address] [id number] [place] [id number] [phone]       Cleta Heatley, Canary Brimhristopher J, MD 03/04/20 [phone]

## 2020-03-05 ENCOUNTER — Telehealth: Payer: Self-pay | Admitting: Internal Medicine

## 2020-03-05 ENCOUNTER — Telehealth: Payer: Self-pay | Admitting: Orthopaedic Surgery

## 2020-03-05 MED ORDER — LEVOTHYROXINE SODIUM 88 MCG PO TABS: 88.0000 ug | ORAL_TABLET | Freq: Every day | ORAL | 1 refills | Status: AC

## 2020-03-05 NOTE — Telephone Encounter (Signed)
1) Medication(s) Requested (by name):levothyroxine (SYNTHROID) 88 MCG tablet    2) Pharmacy of Choice:[id number] - [place] ([place] - [address]  [address],  ([place]) [place] [address]   3) Special Requests:   Approved medications will be sent to the pharmacy, we will reach out if there is an issue.  Requests made after 3pm may not be addressed until the following business day!  If a patient is unsure of the name of the medication(s) please note and ask patient to call back when they are able to provide all info, do not send to responsible party until all information is available!

## 2020-03-05 NOTE — Telephone Encounter (Signed)
Received vm from pt. IC,she is checking on status of request for records to [facility]. I advised pt that I faxed 6/3. She will check with them and call me back if I need to re fax

## 2020-03-06 NOTE — Telephone Encounter (Signed)
Received call back from patient this morning asking records be refaxed to attn Robin. I refaxed [phone]

## 2020-03-21 ENCOUNTER — Other Ambulatory Visit: Payer: Self-pay | Admitting: Nurse Practitioner

## 2020-03-21 NOTE — Telephone Encounter (Signed)
Requested medication (s) are due for refill today:   Yes  Requested medication (s) are on the active medication list:   Yes  Future visit scheduled:   No   Last ordered: 05/11/2019 #90 with 1 refill  Returned because it looks like she is under the care of Dr. Marcy Siren at [facility] not with your office.  No protocol assigned as a result of it being under another practice also.  Requested Prescriptions  Pending Prescriptions Disp Refills   losartan (COZAAR) 100 MG tablet [Pharmacy Med Name: Losartan Potassium 100 MG Oral Tablet] 90 tablet 0    Sig: Take 1 tablet by mouth once daily      There is no refill protocol information for this order

## 2020-06-23 ENCOUNTER — Other Ambulatory Visit: Payer: Self-pay

## 2020-06-23 ENCOUNTER — Emergency Department (HOSPITAL_COMMUNITY): Payer: BLUE CROSS/BLUE SHIELD

## 2020-06-23 ENCOUNTER — Encounter (HOSPITAL_COMMUNITY): Payer: Self-pay | Admitting: *Deleted

## 2020-06-23 ENCOUNTER — Emergency Department (HOSPITAL_COMMUNITY)
Admission: EM | Admit: 2020-06-23 | Discharge: 2020-06-23 | Disposition: A | Discharge status: Home / Self Care | Payer: BLUE CROSS/BLUE SHIELD | Attending: Emergency Medicine | Admitting: Emergency Medicine

## 2020-06-23 DIAGNOSIS — I10 Essential (primary) hypertension: Secondary | ICD-10-CM | POA: Insufficient documentation

## 2020-06-23 DIAGNOSIS — R112 Nausea with vomiting, unspecified: Secondary | ICD-10-CM | POA: Diagnosis not present

## 2020-06-23 DIAGNOSIS — Z79899 Other long term (current) drug therapy: Secondary | ICD-10-CM | POA: Insufficient documentation

## 2020-06-23 DIAGNOSIS — G43001 Migraine without aura, not intractable, with status migrainosus: Secondary | ICD-10-CM

## 2020-06-23 DIAGNOSIS — E039 Hypothyroidism, unspecified: Secondary | ICD-10-CM | POA: Diagnosis not present

## 2020-06-23 DIAGNOSIS — R519 Headache, unspecified: Secondary | ICD-10-CM | POA: Diagnosis not present

## 2020-06-23 DIAGNOSIS — M436 Torticollis: Secondary | ICD-10-CM | POA: Insufficient documentation

## 2020-06-23 DIAGNOSIS — F17210 Nicotine dependence, cigarettes, uncomplicated: Secondary | ICD-10-CM | POA: Diagnosis not present

## 2020-06-23 LAB — I-STAT CHEM 8, ED
BUN: 12 mg/dL (ref 6–20)
Calcium, Ion: 1.2 mmol/L (ref 1.15–1.40)
Chloride: 103 mmol/L (ref 98–111)
Creatinine, Ser: 0.7 mg/dL (ref 0.44–1.00)
Glucose, Bld: 86 mg/dL (ref 70–99)
HCT: 41 % (ref 36.0–46.0)
Hemoglobin: 13.9 g/dL (ref 12.0–15.0)
Potassium: 4.1 mmol/L (ref 3.5–5.1)
Sodium: 142 mmol/L (ref 135–145)
TCO2: 28 mmol/L (ref 22–32)

## 2020-06-23 MED ORDER — PROCHLORPERAZINE EDISYLATE 10 MG/2ML IJ SOLN
10.0000 mg | Freq: Once | INTRAMUSCULAR | Status: AC
  Administered 2020-06-23: 10 mg via INTRAVENOUS
  Filled 2020-06-23: qty 2

## 2020-06-23 MED ORDER — IOHEXOL 350 MG/ML SOLN
100.0000 mL | Freq: Once | INTRAVENOUS | Status: AC | PRN
  Administered 2020-06-23: 100 mL via INTRAVENOUS

## 2020-06-23 MED ORDER — SODIUM CHLORIDE (PF) 0.9 % IJ SOLN
INTRAMUSCULAR | Status: AC
  Filled 2020-06-23: qty 50

## 2020-06-23 MED ORDER — KETOROLAC TROMETHAMINE 15 MG/ML IJ SOLN
15.0000 mg | Freq: Once | INTRAMUSCULAR | Status: AC
  Administered 2020-06-23: 15 mg via INTRAVENOUS
  Filled 2020-06-23: qty 1

## 2020-06-23 NOTE — Discharge Instructions (Addendum)
Great news!  Our imaging showed no evidence of trouble with your aneurysms.  It sounds like you are having a headache which might be related to a migraine tension type headache.  We gave you Compazine and Toradol here in the emergency room to help with your headache, often this provides helpful short-term relief.  I recommend that you follow-up with your neurologist for ongoing care for history of migraines.

## 2020-06-23 NOTE — ED Provider Notes (Signed)
[facility]-EMERGENCY DEPT Provider Note   CSN: [id number] Arrival date & time: 06/23/20  0920     History Chief Complaint  Patient presents with  . Headache    Amy [name] is a 54 y.o. female.  Amy [name] is a 54 year old woman who presents to the ED with the worst headache of her life.  Her previous medical history is significant for 2 cerebral aneurysms, migraines and history of cocaine use.  She reports that she began having a bad headache on Friday afternoon.  She describes the headache as encircling the entire top of her head, it is not one-sided.  She has been having progressive neck stiffness and some nausea.  She had one episode of NB/NB vomiting 1 day ago.  Additionally, she has noted some dizziness with standing and with lying down.  She describes this as being off balance as opposed to the room spinning around her.  She has been growing progressively worried about this headache and ultimately presented to the ED this morning as her symptoms have failed to improve and continued to worsen.  She reports that she has a history of cocaine use but her last use was over a month ago.  She also has a history of migraines and needs to be seen by neurology for this issue.  She has been migraine free for roughly the past 7 years.  She has recently had some hand surgery and has been using ibuprofen frequently for pain control.  She has been taking ibuprofen 800 mg multiple times per day.        Past Medical History:  Diagnosis Date  . Abnormal PFT 01/2018  . Anxiety   . Arthritis   . Brain aneurysm    2-3 mm, stable  . Chronic back pain   . Depression    history of  . Grave's disease 2007   TSH (08/31/2010) = 0.186 (low), free T4 = 0.91 (WNL)  . History of substance abuse (HCC)    remote past  . Hypertension    cardiology consult 01/2018, Dr. Sharyn Lull  . Postsurgical hypothyroidism   . Raynaud disease 2007  . Smoker     Patient Active Problem List    Diagnosis Date Noted  . Trigger finger, right ring finger 09/25/2019  . Trigger finger, right index finger 05/03/2019  . Carpal tunnel syndrome, right upper limb 04/19/2019  . Bilateral carpal tunnel syndrome 12/28/2018  . Abnormal thyroid function test 03/03/2018  . Chronic obstructive pulmonary disease (HCC) 03/03/2018  . Right hip pain 03/03/2018  . Substance abuse (HCC) 03/03/2018  . Preop examination 02/03/2018  . Abnormal PFT 02/03/2018  . Hypothyroidism 02/03/2018  . Spondylolisthesis, lumbar region 01/18/2017  . Cocaine dependence (HCC) 07/25/2015  . 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/25/2015  . Hx of non anemic vitamin B12 deficiency 11/08/2014  . Brain aneurysm 09/09/2014  . Smoker 07/22/2014  . Rhinitis, allergic 07/22/2014  . Chronic low back pain with sciatica 01/24/2013  . Health care maintenance 10/16/2012  . Hot flashes 02/08/2012  . Post-surgical hypothyroidism 12/07/2011  . Fibromyalgia muscle pain 03/31/2011  . Migraine 03/31/2011  . Essential hypertension, benign 11/14/2008  . DEPRESSION 08/10/2006    Past Surgical History:  Procedure Laterality Date  . ABDOMINAL HYSTERECTOMY     due to fibroids, still has ovaries  . CARPAL TUNNEL RELEASE Right 04/19/2019   Procedure: RIGHT CARPAL TUNNEL RELEASE;  Surgeon: Valeria Batman, MD;  Location: [facility];  Service: Orthopedics;  Laterality: Right;  . CARPAL TUNNEL RELEASE Left 05/29/2019   Procedure: LEFT CARPAL TUNNEL RELEASE;  Surgeon: Valeria Batman, MD;  Location: [facility];  Service: Orthopedics;  Laterality: Left;  . COLONOSCOPY  02/14/2008   done by Dr. Christella Hartigan - small colonic polyp identified, 6 mm in size and per biopsy --> tubular adenoma with no malignancy or high grade dysplasia noted  . THYROIDECTOMY  03/08/2012   [facility]; history of Graves' disease with multinodular goiter  . TONSILLECTOMY    . TRIGGER FINGER RELEASE Left 05/29/2019   Procedure:  RELEASE TRIGGER FINGER/A-1 PULLEY RING FINGER;  Surgeon: Valeria Batman, MD;  Location: [facility];  Service: Orthopedics;  Laterality: Left;  . TRIGGER FINGER RELEASE Right 10/30/2019   Procedure: RELEASE RING AND INDEX FINGER TRIGGER FINGERS /A-1 PULLEY;  Surgeon: Valeria Batman, MD;  Location: [facility];  Service: Orthopedics;  Laterality: Right;  . WISDOM TOOTH EXTRACTION       OB History    Gravida  3   Para  3   Term  2   Preterm  1   AB  0   Living  2     SAB  0   TAB      Ectopic      Multiple      Live Births              Family History  Problem Relation Age of Onset  . Colon cancer Maternal Grandfather   . Cancer Maternal Grandfather        prostate  . Hypertension Mother   . Thyroid disease Mother   . Lupus Mother   . Hypertension Father     Social History   Tobacco Use  . Smoking status: Current Every Day Smoker    Packs/day: 0.25    Years: 35.00    Pack years: 8.75    Types: Cigarettes  . Smokeless tobacco: Never Used  Vaping Use  . Vaping Use: Never used  Substance Use Topics  . Alcohol use: No    Alcohol/week: 0.0 standard drinks    Comment: sober for several years  . Drug use: Not Currently    Types: Cocaine, Marijuana    Comment: last used 09/2018, pt in recovery    Home Medications Prior to Admission medications   Medication Sig Start Date End Date Taking? Authorizing Provider  amitriptyline (ELAVIL) 100 MG tablet Take 1 tablet (100 mg total) by mouth at bedtime. 10/15/19   Arvilla Market, DO  amLODipine (NORVASC) 10 MG tablet Take 1 tablet (10 mg total) by mouth daily. 05/09/19   Claiborne Rigg, NP  levothyroxine (SYNTHROID) 88 MCG tablet Take 1 tablet (88 mcg total) by mouth daily. 03/05/20   Arvilla Market, DO  losartan (COZAAR) 100 MG tablet Take 1 tablet (100 mg total) by mouth daily. 05/11/19   Claiborne Rigg, NP  metoprolol succinate (TOPROL-XL) 100 MG 24 hr  tablet Take 1 tablet (100 mg total) by mouth daily. Take with or immediately following a meal. 12/17/19   Arvilla Market, DO    Allergies    Adhesive [tape], Morphine and related, and Vicodin [hydrocodone-acetaminophen]  Review of Systems   Review of Systems  Constitutional: Negative for activity change and fever.  Respiratory: Negative for chest tightness.   Cardiovascular: Positive for palpitations. Negative for chest pain.  Gastrointestinal: Positive for nausea and vomiting. Negative for abdominal pain and diarrhea.  Genitourinary:  Negative for dysuria.  Musculoskeletal: Positive for neck pain and neck stiffness. Negative for arthralgias.  Skin: Negative for pallor.  Neurological: Positive for dizziness.    Physical Exam Updated Vital Signs BP (!) 131/95 (BP Location: Right Arm)   Pulse 92   Temp 98.3 F (36.8 C) (Oral)   Resp 16   Ht 5\' 4"  (1.626 m)   Wt 83.9 kg   LMP 01/26/2000   SpO2 98%   BMI 31.76 kg/m   Physical Exam Constitutional:      General: She is not in acute distress.    Appearance: She is well-developed and normal weight. She is not ill-appearing or diaphoretic.  HENT:     Head: Normocephalic and atraumatic.  Eyes:     Extraocular Movements: Extraocular movements intact.     Right eye: Normal extraocular motion and no nystagmus.     Left eye: Normal extraocular motion and no nystagmus.     Pupils: Pupils are equal, round, and reactive to light.     Right eye: Pupil is round and reactive.     Left eye: Pupil is round and reactive.  Cardiovascular:     Rate and Rhythm: Normal rate and regular rhythm.     Heart sounds: Normal heart sounds. No murmur heard.   Pulmonary:     Effort: Pulmonary effort is normal.     Breath sounds: Normal breath sounds.  Abdominal:     General: Bowel sounds are normal. There is no distension.     Palpations: Abdomen is soft.  Musculoskeletal:        General: Normal range of motion.     Cervical back: Normal  range of motion and neck supple.  Skin:    General: Skin is warm and dry.     Capillary Refill: Capillary refill takes less than 2 seconds.  Neurological:     Mental Status: She is alert and oriented to person, place, and time. Mental status is at baseline.     GCS: GCS eye subscore is 4. GCS verbal subscore is 5. GCS motor subscore is 6.     Cranial Nerves: No cranial nerve deficit, dysarthria or facial asymmetry.  Psychiatric:        Mood and Affect: Mood normal.        Speech: Speech normal.        Behavior: Behavior normal.     ED Results / Procedures / Treatments   Labs (all labs ordered are listed, but only abnormal results are displayed) Labs Reviewed  I-STAT CHEM 8, ED    EKG None  Radiology No results found.  Procedures Procedures (including critical care time)  Medications Ordered in ED Medications - No data to display  ED Course  I have reviewed the triage vital signs and the nursing notes.  Pertinent labs & imaging results that were available during my care of the patient were reviewed by me and considered in my medical decision making (see chart for details).    MDM Rules/Calculators/A&P                          Ms. Nearhood is a 54 year old woman who presents to the ED with the worst headache of her life.  Her previous medical history is significant for 2 cerebral aneurysms, migraines and history of cocaine use.  Most concerning on the differential is ruptured aneurysm.  Differential also includes, migraine, tension headache, withdrawal symptoms (although her last use was distant).  For now, will rule out ruptured with CTA head.  Aneurysm then move forward from there. Final Clinical Impression(s) / ED Diagnoses Final diagnoses:  None    Rx / DC Orders ED Discharge Orders    None       Mirian MoFrank, Khristopher Kapaun, MD 06/23/20 1737    Gerhard MunchLockwood, Robert, MD 06/25/20 1016

## 2020-06-23 NOTE — ED Triage Notes (Signed)
Pt has history of 2 aneurysms. Prior to each she developed a headache. Normally does not have headache. Thursday developed a headache which is getting worse.

## 2020-07-03 ENCOUNTER — Other Ambulatory Visit: Payer: Self-pay | Admitting: Nurse Practitioner

## 2020-07-03 DIAGNOSIS — I10 Essential (primary) hypertension: Secondary | ICD-10-CM

## 2020-07-09 ENCOUNTER — Other Ambulatory Visit: Payer: Self-pay | Admitting: Nurse Practitioner

## 2020-07-09 DIAGNOSIS — I10 Essential (primary) hypertension: Secondary | ICD-10-CM

## 2020-07-14 ENCOUNTER — Other Ambulatory Visit: Payer: Self-pay | Admitting: Nurse Practitioner

## 2020-07-14 DIAGNOSIS — I10 Essential (primary) hypertension: Secondary | ICD-10-CM

## 2020-07-15 NOTE — Telephone Encounter (Signed)
Refusing request-per chart this patient is no longer under the care of this physician.

## 2020-09-15 ENCOUNTER — Emergency Department (HOSPITAL_COMMUNITY)
Admission: EM | Admit: 2020-09-15 | Discharge: 2020-09-15 | Disposition: A | Discharge status: Home / Self Care | Payer: BLUE CROSS/BLUE SHIELD | Attending: Emergency Medicine | Admitting: Emergency Medicine

## 2020-09-15 ENCOUNTER — Encounter (HOSPITAL_COMMUNITY): Payer: Self-pay

## 2020-09-15 ENCOUNTER — Other Ambulatory Visit: Payer: Self-pay

## 2020-09-15 DIAGNOSIS — G43909 Migraine, unspecified, not intractable, without status migrainosus: Secondary | ICD-10-CM | POA: Diagnosis not present

## 2020-09-15 DIAGNOSIS — I10 Essential (primary) hypertension: Secondary | ICD-10-CM | POA: Insufficient documentation

## 2020-09-15 DIAGNOSIS — E039 Hypothyroidism, unspecified: Secondary | ICD-10-CM | POA: Insufficient documentation

## 2020-09-15 DIAGNOSIS — R519 Headache, unspecified: Secondary | ICD-10-CM | POA: Diagnosis present

## 2020-09-15 DIAGNOSIS — Z79899 Other long term (current) drug therapy: Secondary | ICD-10-CM | POA: Insufficient documentation

## 2020-09-15 DIAGNOSIS — F17210 Nicotine dependence, cigarettes, uncomplicated: Secondary | ICD-10-CM | POA: Diagnosis not present

## 2020-09-15 LAB — BASIC METABOLIC PANEL
Anion gap: 5 (ref 5–15)
BUN: 14 mg/dL (ref 6–20)
CO2: 30 mmol/L (ref 22–32)
Calcium: 9.2 mg/dL (ref 8.9–10.3)
Chloride: 105 mmol/L (ref 98–111)
Creatinine, Ser: 0.63 mg/dL (ref 0.44–1.00)
GFR, Estimated: >60 mL/min (ref >60)
Glucose, Bld: 89 mg/dL (ref 70–99)
Potassium: 3.8 mmol/L (ref 3.5–5.1)
Sodium: 140 mmol/L (ref 135–145)

## 2020-09-15 LAB — CBC WITH DIFFERENTIAL/PLATELET
Abs Immature Granulocytes: 0.04 10*3/uL (ref 0.00–0.07)
Basophils Absolute: 0 10*3/uL (ref 0.0–0.1)
Basophils Relative: 1 %
Eosinophils Absolute: 0.2 10*3/uL (ref 0.0–0.5)
Eosinophils Relative: 2 %
HCT: 40.3 % (ref 36.0–46.0)
Hemoglobin: 12.9 g/dL (ref 12.0–15.0)
Immature Granulocytes: 1 %
Lymphocytes Relative: 35 %
Lymphs Abs: 2.9 10*3/uL (ref 0.7–4.0)
MCH: 29.9 pg (ref 26.0–34.0)
MCHC: 32 g/dL (ref 30.0–36.0)
MCV: 93.3 fL (ref 80.0–100.0)
Monocytes Absolute: 0.5 10*3/uL (ref 0.1–1.0)
Monocytes Relative: 7 %
Neutro Abs: 4.6 10*3/uL (ref 1.7–7.7)
Neutrophils Relative %: 54 %
Platelets: 245 10*3/uL (ref 150–400)
RBC: 4.32 MIL/uL (ref 3.87–5.11)
RDW: 12.9 % (ref 11.5–15.5)
WBC: 8.3 10*3/uL (ref 4.0–10.5)
nRBC: 0 % (ref 0.0–0.2)

## 2020-09-15 LAB — I-STAT BETA HCG BLOOD, ED (MC, WL, AP ONLY): I-stat hCG, quantitative: <5 m[IU]/mL (ref <5)

## 2020-09-15 MED ORDER — MAGNESIUM GLUCONATE 250 MG PO TABS: 250.0000 mg | ORAL_TABLET | Freq: Two times a day (BID) | ORAL | 0 refills | Status: AC

## 2020-09-15 MED ORDER — PROCHLORPERAZINE EDISYLATE 10 MG/2ML IJ SOLN
10.0000 mg | Freq: Once | INTRAMUSCULAR | Status: AC
  Administered 2020-09-15: 10 mg via INTRAVENOUS
  Filled 2020-09-15: qty 2

## 2020-09-15 MED ORDER — SODIUM CHLORIDE 0.9 % IV BOLUS
500.0000 mL | Freq: Once | INTRAVENOUS | Status: AC
  Administered 2020-09-15: 500 mL via INTRAVENOUS

## 2020-09-15 NOTE — Discharge Instructions (Signed)
I am glad that your headache symptoms have improved while you are here in the ER.  It is vitally important that you get established with a neurologist for ongoing evaluation and management of your headache disorder.  Your headaches are right-sided and seem to have responded well to prochlorperazine which is effective for treating migraines.  I have prescribed you magnesium gluconate medications which mitigate frequency of migraine disorder.  Please follow-up with your primary care provider, ideally later this week.  Continue with Tylenol as needed. I also encourage you to try warm heating pads to the neck in the event this is muscular in nature.  Return to the ED or seek immediate medical attention should you develop any new or worsening symptoms.

## 2020-09-15 NOTE — ED Triage Notes (Signed)
Pt presents with c/o headache and neck pain. Pt also reports some associated dizziness with the headache. Pt reports she does currently have 2 brain aneurysms.

## 2020-09-15 NOTE — ED Provider Notes (Signed)
[facility]-EMERGENCY DEPT Provider Note   CSN: [id number] Arrival date & time: 09/15/20  8563     History Chief Complaint  Patient presents with  . Headache    Amy [name] is a 54 y.o. female with PMH of thyroid disease on Synthroid, HTN, cocaine-use, and bilateral 2-3 mm ICA aneurysms who presents to the ED with complaints of HA.  I reviewed patient's medical records and she was evaluated for headache symptoms on 06/23/2020 and CTA obtained at that time showed that her aneurysms were unchanged.  She improved with Compazine and Toradol.  She was referred to neurology at [facility] on 07/31/2020 for bilateral hand pain but was declined by the providers given suspicion that it was post-surgical pain related to her b/l trigger finger release procedures.  On my examination, patient is endorsing a progressively worsening right-sided headache with associated right-sided neck discomfort since 09/10/2020.  Currently it is 7/10.  She states that her right-sided headache feels comparable to her history of migraines and is concerned that they are becoming more frequent after being in remission for several years.  She actually states that the last time she used any cocaine was February 2020.  She does not drink alcohol or engage in any illicit drug use.  She will not even use her prescribed tramadol unless absolutely necessary given her history of substance use disorder.  Patient does endorse current tobacco use in context of 30-pack-year smoking history.  She has not been taking ibuprofen given her GERD and instead has been treating her headache symptoms with extra strength Tylenol.  She believes that she has an appointment scheduled with neurology for early January, however she cannot tell me with whom.  She also tells me that oftentimes when she stands up or sits down she will have a less than 1 second "whoosh" where she feels faint, but states that it quickly resolves.  Patient  also states that sometimes she has some unilateral weakness/numbness associated with her headaches which has had her concerned for stroke.  Those symptoms have been going on for months.  She denies any persistent room spinning dizziness, fevers or chills, syncope, chest pain or shortness of breath, blurred vision, or other focal neurologic deficits.  HPI     Past Medical History:  Diagnosis Date  . Abnormal PFT 01/2018  . Anxiety   . Arthritis   . Brain aneurysm    2-3 mm, stable  . Chronic back pain   . Depression    history of  . Grave's disease 2007   TSH (08/31/2010) = 0.186 (low), free T4 = 0.91 (WNL)  . History of substance abuse (HCC)    remote past  . Hypertension    cardiology consult 01/2018, Dr. Sharyn Lull  . Postsurgical hypothyroidism   . Raynaud disease 2007  . Smoker     Patient Active Problem List   Diagnosis Date Noted  . Trigger finger, right ring finger 09/25/2019  . Trigger finger, right index finger 05/03/2019  . Carpal tunnel syndrome, right upper limb 04/19/2019  . Bilateral carpal tunnel syndrome 12/28/2018  . Abnormal thyroid function test 03/03/2018  . Chronic obstructive pulmonary disease (HCC) 03/03/2018  . Right hip pain 03/03/2018  . Substance abuse (HCC) 03/03/2018  . Preop examination 02/03/2018  . Abnormal PFT 02/03/2018  . Hypothyroidism 02/03/2018  . Spondylolisthesis, lumbar region 01/18/2017  . Cocaine dependence (HCC) 07/25/2015  . 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/25/2015  . Hx of non anemic vitamin B12 deficiency  11/08/2014  . Brain aneurysm 09/09/2014  . Smoker 07/22/2014  . Rhinitis, allergic 07/22/2014  . Chronic low back pain with sciatica 01/24/2013  . Health care maintenance 10/16/2012  . Hot flashes 02/08/2012  . Post-surgical hypothyroidism 12/07/2011  . Fibromyalgia muscle pain 03/31/2011  . Migraine 03/31/2011  . Essential hypertension, benign 11/14/2008  . DEPRESSION 08/10/2006    Past Surgical History:   Procedure Laterality Date  . ABDOMINAL HYSTERECTOMY     due to fibroids, still has ovaries  . CARPAL TUNNEL RELEASE Right 04/19/2019   Procedure: RIGHT CARPAL TUNNEL RELEASE;  Surgeon: Valeria Batman, MD;  Location: [facility];  Service: Orthopedics;  Laterality: Right;  . CARPAL TUNNEL RELEASE Left 05/29/2019   Procedure: LEFT CARPAL TUNNEL RELEASE;  Surgeon: Valeria Batman, MD;  Location: [facility];  Service: Orthopedics;  Laterality: Left;  . COLONOSCOPY  02/14/2008   done by Dr. Christella Hartigan - small colonic polyp identified, 6 mm in size and per biopsy --> tubular adenoma with no malignancy or high grade dysplasia noted  . THYROIDECTOMY  03/08/2012   [facility]; history of Graves' disease with multinodular goiter  . TONSILLECTOMY    . TRIGGER FINGER RELEASE Left 05/29/2019   Procedure: RELEASE TRIGGER FINGER/A-1 PULLEY RING FINGER;  Surgeon: Valeria Batman, MD;  Location: [facility];  Service: Orthopedics;  Laterality: Left;  . TRIGGER FINGER RELEASE Right 10/30/2019   Procedure: RELEASE RING AND INDEX FINGER TRIGGER FINGERS /A-1 PULLEY;  Surgeon: Valeria Batman, MD;  Location:  SUR[facility]ERY [facility];  Service: Orthopedics;  Laterality: Right;  . WISDOM TOOTH EXTRACTION       OB History    Gravida  3   Para  3   Term  2   Preterm  1   AB  0   Living  2     SAB  0   IAB      Ectopic      Multiple      Live Births              Family History  Problem Relation Age of Onset  . Colon cancer Maternal Grandfather   . Cancer Maternal Grandfather        prostate  . Hypertension Mother   . Thyroid disease Mother   . Lupus Mother   . Hypertension Father     Social History   Tobacco Use  . Smoking status: Current Every Day Smoker    Packs/day: 0.25    Years: 35.00    Pack years: 8.75    Types: Cigarettes  . Smokeless tobacco: Never Used  Vaping Use  . Vaping Use: Never used  Substance Use Topics   . Alcohol use: No    Alcohol/week: 0.0 standard drinks    Comment: sober for several years  . Drug use: Not Currently    Types: Cocaine, Marijuana    Comment: last used 09/2018, pt in recovery    Home Medications Prior to Admission medications   Medication Sig Start Date End Date Taking? Authorizing Provider  amitriptyline (ELAVIL) 100 MG tablet Take 1 tablet (100 mg total) by mouth at bedtime. 10/15/19   Arvilla Market, DO  amLODipine (NORVASC) 10 MG tablet Take 1 tablet (10 mg total) by mouth daily. 05/09/19   Claiborne Rigg, NP  gabapentin (NEURONTIN) 300 MG capsule Take 300 mg by mouth 3 (three) times daily. 07/22/20   [provider]  levothyroxine (SYNTHROID) 88  MCG tablet Take 1 tablet (88 mcg total) by mouth daily. 03/05/20   Arvilla Market, DO  losartan (COZAAR) 100 MG tablet Take 1 tablet (100 mg total) by mouth daily. 05/11/19   Claiborne Rigg, NP  metoprolol succinate (TOPROL-XL) 100 MG 24 hr tablet Take 1 tablet (100 mg total) by mouth daily. Take with or immediately following a meal. 12/17/19   Arvilla Market, DO  pantoprazole (PROTONIX) 20 MG tablet Take 20 mg by mouth daily. 08/04/20   [provider]    Allergies    Adhesive [tape], Morphine and related, and Vicodin [hydrocodone-acetaminophen]  Review of Systems   Review of Systems  All other systems reviewed and are negative.   Physical Exam Updated Vital Signs BP 122/86 (BP Location: Right Arm)   Pulse 82   Temp 98.3 F (36.8 C) (Oral)   Resp 17   LMP 01/26/2000   SpO2 100%   Physical Exam Vitals and nursing note reviewed. Exam conducted with a chaperone present.  Constitutional:      General: She is not in acute distress.    Appearance: Normal appearance. She is not ill-appearing.  HENT:     Head: Normocephalic and atraumatic.  Eyes:     General: No scleral icterus.    Extraocular Movements: Extraocular movements intact.     Conjunctiva/sclera:  Conjunctivae normal.     Pupils: Pupils are equal, round, and reactive to light.     Comments: No nystagmus.   Neck:     Vascular: No carotid bruit.     Comments: No meningismus.   Cardiovascular:     Rate and Rhythm: Normal rate and regular rhythm.  Pulmonary:     Effort: Pulmonary effort is normal. No respiratory distress.  Musculoskeletal:     Cervical back: Normal range of motion and neck supple. No rigidity.  Skin:    General: Skin is dry.     Capillary Refill: Capillary refill takes less than 2 seconds.  Neurological:     General: No focal deficit present.     Mental Status: She is alert and oriented to person, place, and time.     GCS: GCS eye subscore is 4. GCS verbal subscore is 5. GCS motor subscore is 6.     Cranial Nerves: No cranial nerve deficit.     Sensory: No sensory deficit.     Motor: No weakness.     Coordination: Coordination normal.     Gait: Gait normal.     Comments: Patient is alert and oriented x4.  CN II through XII grossly intact.  No focal deficits.  Strength intact and symmetric bilaterally.  Ambulates without ataxia or difficulty.  Negative Romberg and cerebellar exams.  PERRL and EOM intact, no nystagmus.  Mild hyporeflexia bilaterally.  Psychiatric:        Mood and Affect: Mood normal.        Behavior: Behavior normal.        Thought Content: Thought content normal.     ED Results / Procedures / Treatments   Labs (all labs ordered are listed, but only abnormal results are displayed) Labs Reviewed  CBC WITH DIFFERENTIAL/PLATELET  BASIC METABOLIC PANEL  I-STAT BETA HCG BLOOD, ED ([facility], [facility], [facility] ONLY)    EKG None  Radiology No results found.  Procedures Procedures (including critical care time)  Medications Ordered in ED Medications  prochlorperazine (COMPAZINE) injection 10 mg (10 mg Intravenous Given 09/15/20 1213)  sodium chloride 0.9 % bolus 500 mL (  500 mLs Intravenous Bolus 09/15/20 1214)    ED Course  I have reviewed the triage  vital signs and the nursing notes.  Pertinent labs & imaging results that were available during my care of the patient were reviewed by me and considered in my medical decision making (see chart for details).    MDM Rules/Calculators/A&P                          Laboratory work-up was entirely unremarkable.  Patient was given 500 mL IV NS and 10 mg prochlorperazine injection.  On subsequent evaluation, patient states that her symptoms have entirely resolved.  She is denying any persistent headache symptoms.  I have much lower suspicion for ruptured aneurysm or other acute intracranial abnormalities.  Do not feel as though repeat imaging is warranted.  Presentation is non concerning for [facility], ICH, venous sinus thrombosis, meningitis, encephalitis, or temporal arteritis. Pt is afebrile with no focal neuro deficits, nuchal rigidity, or change in vision.   She denies any hx PUD, however has been advised by her PCP to avoid NSAIDs given GERD.  We will discharge her home with a course of magnesium gluconate 250 mg twice daily an effort to mitigate frequency of her headache symptoms in the event that they are truly migraines.  She has responded very well to prochlorperazine 10 mg IV in the ER these past couple of encounters for migraine headaches.  I emphasized the importance of outpatient follow-up with neurologist.  She states that she has one in early January, but will verify with her primary care provider.  She states that she sees them first thing next week, but will try to arrange for closer follow-up.    The patient was counseled on the dangers of tobacco use, and was advised to quit.  Reviewed strategies to maximize success, including removing cigarettes and smoking materials from environment, stress management, substitution of other forms of reinforcement, support of family/friends and written materials. Total time was 5 min CPT code [id number].    Final Clinical Impression(s) / ED Diagnoses Final  diagnoses:  Migraine without status migrainosus, not intractable, unspecified migraine type    Rx / DC Orders ED Discharge Orders    None       Lorelee NewGreen, Quinci Gavidia L, PA-C 09/15/20 1314    Tilden Fossaees, Elizabeth, MD 09/15/20 [id number]

## 2020-09-18 ENCOUNTER — Other Ambulatory Visit: Payer: Self-pay | Admitting: Internal Medicine

## 2020-09-29 ENCOUNTER — Other Ambulatory Visit: Payer: Self-pay

## 2020-09-29 NOTE — Telephone Encounter (Signed)
Received RF request from pharmacy for levothyroxine, per chart review pt has switched PCP as of 03-19-20, returned request to pharmacy via fax indicating no longer under Dr. Philis Pique care

## 2020-12-24 ENCOUNTER — Other Ambulatory Visit: Payer: Self-pay | Admitting: Internal Medicine

## 2020-12-24 DIAGNOSIS — I10 Essential (primary) hypertension: Secondary | ICD-10-CM

## 2021-01-05 ENCOUNTER — Other Ambulatory Visit: Payer: Self-pay | Admitting: Internal Medicine

## 2021-01-05 DIAGNOSIS — I10 Essential (primary) hypertension: Secondary | ICD-10-CM

## 2021-01-08 ENCOUNTER — Other Ambulatory Visit: Payer: Self-pay | Admitting: Internal Medicine

## 2021-01-08 DIAGNOSIS — I10 Essential (primary) hypertension: Secondary | ICD-10-CM

## 2021-02-16 ENCOUNTER — Emergency Department (HOSPITAL_COMMUNITY): Payer: BLUE CROSS/BLUE SHIELD

## 2021-02-16 ENCOUNTER — Other Ambulatory Visit: Payer: Self-pay

## 2021-02-16 ENCOUNTER — Encounter (HOSPITAL_COMMUNITY): Payer: Self-pay

## 2021-02-16 ENCOUNTER — Emergency Department (HOSPITAL_COMMUNITY)
Admission: EM | Admit: 2021-02-16 | Discharge: 2021-02-16 | Disposition: A | Discharge status: Home / Self Care | Payer: BLUE CROSS/BLUE SHIELD | Attending: Emergency Medicine | Admitting: Emergency Medicine

## 2021-02-16 DIAGNOSIS — E039 Hypothyroidism, unspecified: Secondary | ICD-10-CM | POA: Insufficient documentation

## 2021-02-16 DIAGNOSIS — R110 Nausea: Secondary | ICD-10-CM | POA: Diagnosis not present

## 2021-02-16 DIAGNOSIS — W01198A Fall on same level from slipping, tripping and stumbling with subsequent striking against other object, initial encounter: Secondary | ICD-10-CM | POA: Insufficient documentation

## 2021-02-16 DIAGNOSIS — R519 Headache, unspecified: Secondary | ICD-10-CM

## 2021-02-16 DIAGNOSIS — I10 Essential (primary) hypertension: Secondary | ICD-10-CM | POA: Diagnosis not present

## 2021-02-16 DIAGNOSIS — Z79899 Other long term (current) drug therapy: Secondary | ICD-10-CM | POA: Diagnosis not present

## 2021-02-16 DIAGNOSIS — S0990XA Unspecified injury of head, initial encounter: Secondary | ICD-10-CM | POA: Diagnosis not present

## 2021-02-16 DIAGNOSIS — F17210 Nicotine dependence, cigarettes, uncomplicated: Secondary | ICD-10-CM | POA: Insufficient documentation

## 2021-02-16 MED ORDER — KETOROLAC TROMETHAMINE 30 MG/ML IJ SOLN
30.0000 mg | Freq: Once | INTRAMUSCULAR | Status: AC
  Administered 2021-02-16: 30 mg via INTRAMUSCULAR
  Filled 2021-02-16: qty 1

## 2021-02-16 MED ORDER — PROCHLORPERAZINE EDISYLATE 10 MG/2ML IJ SOLN: 10.0000 mg | Freq: Once | INTRAMUSCULAR | Status: DC

## 2021-02-16 MED ORDER — SODIUM CHLORIDE 0.9 % IV BOLUS: 500.0000 mL | Freq: Once | INTRAVENOUS | Status: DC

## 2021-02-16 MED ORDER — KETOROLAC TROMETHAMINE 30 MG/ML IJ SOLN: 15.0000 mg | Freq: Once | INTRAMUSCULAR | Status: DC

## 2021-02-16 MED ORDER — PROCHLORPERAZINE EDISYLATE 10 MG/2ML IJ SOLN
10.0000 mg | Freq: Once | INTRAMUSCULAR | Status: AC
  Administered 2021-02-16: 10 mg via INTRAMUSCULAR
  Filled 2021-02-16: qty 2

## 2021-02-16 NOTE — ED Triage Notes (Signed)
Pt c/o slip and fall from standing and struck head on floor Saturday with continuing headache since. Denies any injury.

## 2021-02-16 NOTE — ED Provider Notes (Signed)
[facility]-EMERGENCY DEPT Provider Note   CSN: [id number] Arrival date & time: 02/16/21  0818     History Chief Complaint  Patient presents with  . Fall  . Headache    Amy [name] is a 55 y.o. female.  HPI Patient presents with headache.  Right-sided.  Also some neck pain.  Began after a fall.  States she slipped and fell Saturday with today being Monday and hit the side of her head.  States headache was there yesterday but worse today.  No numbness or weakness.  Is a little sharper than her baseline migraines but otherwise is similar.  Slight nausea.  No vomiting.  No photophobia.  No numbness weakness.  States that shoulder does feel little strange however.  No chest pain.  No abdominal pain.  Also has reported brain aneurysm that is been stable.    Past Medical History:  Diagnosis Date  . Abnormal PFT 01/2018  . Anxiety   . Arthritis   . Brain aneurysm    2-3 mm, stable  . Chronic back pain   . Depression    history of  . Grave's disease 2007   TSH (08/31/2010) = 0.186 (low), free T4 = 0.91 (WNL)  . History of substance abuse (HCC)    remote past  . Hypertension    cardiology consult 01/2018, Dr. Sharyn Lull  . Postsurgical hypothyroidism   . Raynaud disease 2007  . Smoker     Patient Active Problem List   Diagnosis Date Noted  . Trigger finger, right ring finger 09/25/2019  . Trigger finger, right index finger 05/03/2019  . Carpal tunnel syndrome, right upper limb 04/19/2019  . Bilateral carpal tunnel syndrome 12/28/2018  . Abnormal thyroid function test 03/03/2018  . Chronic obstructive pulmonary disease (HCC) 03/03/2018  . Right hip pain 03/03/2018  . Substance abuse (HCC) 03/03/2018  . Preop examination 02/03/2018  . Abnormal PFT 02/03/2018  . Hypothyroidism 02/03/2018  . Spondylolisthesis, lumbar region 01/18/2017  . Cocaine dependence (HCC) 07/25/2015  . 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/25/2015  . Hx of non anemic vitamin B12  deficiency 11/08/2014  . Brain aneurysm 09/09/2014  . Smoker 07/22/2014  . Rhinitis, allergic 07/22/2014  . Chronic low back pain with sciatica 01/24/2013  . Health care maintenance 10/16/2012  . Hot flashes 02/08/2012  . Post-surgical hypothyroidism 12/07/2011  . Fibromyalgia muscle pain 03/31/2011  . Migraine 03/31/2011  . Essential hypertension, benign 11/14/2008  . DEPRESSION 08/10/2006    Past Surgical History:  Procedure Laterality Date  . ABDOMINAL HYSTERECTOMY     due to fibroids, still has ovaries  . CARPAL TUNNEL RELEASE Right 04/19/2019   Procedure: RIGHT CARPAL TUNNEL RELEASE;  Surgeon: Valeria Batman, MD;  Location: [facility];  Service: Orthopedics;  Laterality: Right;  . CARPAL TUNNEL RELEASE Left 05/29/2019   Procedure: LEFT CARPAL TUNNEL RELEASE;  Surgeon: Valeria Batman, MD;  Location: [facility];  Service: Orthopedics;  Laterality: Left;  . COLONOSCOPY  02/14/2008   done by Dr. Christella Hartigan - small colonic polyp identified, 6 mm in size and per biopsy --> tubular adenoma with no malignancy or high grade dysplasia noted  . THYROIDECTOMY  03/08/2012   [facility]; history of Graves' disease with multinodular goiter  . TONSILLECTOMY    . TRIGGER FINGER RELEASE Left 05/29/2019   Procedure: RELEASE TRIGGER FINGER/A-1 PULLEY RING FINGER;  Surgeon: Valeria Batman, MD;  Location: [facility];  Service: Orthopedics;  Laterality: Left;  .  TRIGGER FINGER RELEASE Right 10/30/2019   Procedure: RELEASE RING AND INDEX FINGER TRIGGER FINGERS /A-1 PULLEY;  Surgeon: Valeria Batman, MD;  Location: [facility];  Service: Orthopedics;  Laterality: Right;  . WISDOM TOOTH EXTRACTION       OB History    Gravida  3   Para  3   Term  2   Preterm  1   AB  0   Living  2     SAB  0   IAB      Ectopic      Multiple      Live Births              Family History  Problem Relation Age of Onset  . Colon  cancer Maternal Grandfather   . Cancer Maternal Grandfather        prostate  . Hypertension Mother   . Thyroid disease Mother   . Lupus Mother   . Hypertension Father     Social History   Tobacco Use  . Smoking status: Current Every Day Smoker    Packs/day: 0.25    Years: 35.00    Pack years: 8.75    Types: Cigarettes  . Smokeless tobacco: Never Used  Vaping Use  . Vaping Use: Never used  Substance Use Topics  . Alcohol use: No    Alcohol/week: 0.0 standard drinks    Comment: sober for several years  . Drug use: Not Currently    Types: Cocaine, Marijuana    Comment: last used 09/2018, pt in recovery    Home Medications Prior to Admission medications   Medication Sig Start Date End Date Taking? Authorizing Provider  amitriptyline (ELAVIL) 100 MG tablet Take 1 tablet (100 mg total) by mouth at bedtime. 10/15/19   Arvilla Market, DO  amLODipine (NORVASC) 10 MG tablet Take 1 tablet (10 mg total) by mouth daily. 05/09/19   Claiborne Rigg, NP  gabapentin (NEURONTIN) 300 MG capsule Take 900 mg by mouth at bedtime. 07/22/20   [provider]  levothyroxine (SYNTHROID) 88 MCG tablet Take 1 tablet (88 mcg total) by mouth daily. 03/05/20   Arvilla Market, DO  losartan (COZAAR) 100 MG tablet Take 1 tablet (100 mg total) by mouth daily. 05/11/19   Claiborne Rigg, NP  Magnesium Gluconate 250 MG TABS Take 1 tablet (250 mg total) by mouth 2 (two) times daily. 09/15/20   Lorelee New, PA-C  metoprolol succinate (TOPROL-XL) 100 MG 24 hr tablet Take 1 tablet (100 mg total) by mouth daily. Take with or immediately following a meal. 12/17/19   Arvilla Market, DO  pantoprazole (PROTONIX) 20 MG tablet Take 20 mg by mouth daily. 08/04/20   [provider]  pseudoephedrine (SUDAFED) 120 MG 12 hr tablet Take 120 mg by mouth every 12 (twelve) hours as needed for congestion.    [provider]    Allergies    Adhesive [tape], Morphine and  related, and Vicodin [hydrocodone-acetaminophen]  Review of Systems   Review of Systems  Constitutional: Negative for appetite change.  HENT: Negative for congestion.   Respiratory: Negative for shortness of breath.   Cardiovascular: Negative for chest pain.  Gastrointestinal: Positive for nausea.  Genitourinary: Negative for flank pain.  Musculoskeletal: Positive for neck pain. Negative for back pain.  Skin: Negative for rash.  Neurological: Positive for headaches. Negative for weakness and numbness.  Psychiatric/Behavioral: Negative for behavioral problems.    Physical Exam Updated Vital  Signs BP 124/81   Pulse 88   Temp 98.1 F (36.7 C) (Oral)   Resp 16   LMP 01/26/2000   SpO2 98%   Physical Exam Vitals reviewed.  Constitutional:      Appearance: She is well-developed.  HENT:     Head: Atraumatic.  Eyes:     Pupils: Pupils are equal, round, and reactive to light.  Neck:     Comments: No midline tenderness.  Tenderness over trapezius on right side. Cardiovascular:     Rate and Rhythm: Regular rhythm.  Chest:     Chest wall: No tenderness.  Abdominal:     Tenderness: There is no abdominal tenderness.  Musculoskeletal:     Cervical back: Neck supple.  Skin:    General: Skin is warm.  Neurological:     Mental Status: She is alert.  Psychiatric:        Mood and Affect: Mood normal.     ED Results / Procedures / Treatments   Labs (all labs ordered are listed, but only abnormal results are displayed) Labs Reviewed - No data to display  EKG None  Radiology CT Head Wo Contrast  Result Date: 02/16/2021 CLINICAL DATA:  Pt c/o slip and fall from standing and struck head on floor Saturday with continuing headache since. Denies any injury. EXAM: CT HEAD WITHOUT CONTRAST TECHNIQUE: Contiguous axial images were obtained from the base of the skull through the vertex without intravenous contrast. COMPARISON:  06/23/2020. FINDINGS: Brain: No evidence of acute  infarction, hemorrhage, hydrocephalus, extra-axial collection or mass lesion/mass effect. Vascular: No hyperdense vessel or unexpected calcification. Skull: Normal. Negative for fracture or focal lesion. Sinuses/Orbits: Visualized globes and orbits are unremarkable. Visualized sinuses are clear. Other: None. IMPRESSION: Normal unenhanced CT scan of the brain. Electronically Signed   By: Amie Portland M.D.   On: 02/16/2021 10:11    Procedures Procedures   Medications Ordered in ED Medications  prochlorperazine (COMPAZINE) injection 10 mg (10 mg Intramuscular Given 02/16/21 0922)  ketorolac (TORADOL) 30 MG/ML injection 30 mg (30 mg Intramuscular Given 02/16/21 6945)    ED Course  I have reviewed the triage vital signs and the nursing notes.  Pertinent labs & imaging results that were available during my care of the patient were reviewed by me and considered in my medical decision making (see chart for details).    MDM Rules/Calculators/A&P                          Patient with fall.  Hit head 2 days ago.  Continued headache.  Has history of migraines and this is somewhat typical for that.  Also aneurysm has been stable and overall relatively small.  Patient felt better after treatment.  Do not feel as if we need further work-up.  Head CT done due to the injury and reassuring.  Discharged home Final Clinical Impression(s) / ED Diagnoses Final diagnoses:  Generalized headaches  Injury of head, initial encounter    Rx / DC Orders ED Discharge Orders    None       Benjiman Core, MD 02/16/21 1042

## 2021-02-19 ENCOUNTER — Other Ambulatory Visit: Payer: Self-pay

## 2021-02-19 ENCOUNTER — Emergency Department (HOSPITAL_COMMUNITY)
Admission: EM | Admit: 2021-02-19 | Discharge: 2021-02-19 | Disposition: A | Discharge status: Home / Self Care | Payer: BLUE CROSS/BLUE SHIELD | Attending: Emergency Medicine | Admitting: Emergency Medicine

## 2021-02-19 ENCOUNTER — Encounter (HOSPITAL_COMMUNITY): Payer: Self-pay | Admitting: Emergency Medicine

## 2021-02-19 DIAGNOSIS — X12XXXA Contact with other hot fluids, initial encounter: Secondary | ICD-10-CM | POA: Insufficient documentation

## 2021-02-19 DIAGNOSIS — Y9289 Other specified places as the place of occurrence of the external cause: Secondary | ICD-10-CM | POA: Diagnosis not present

## 2021-02-19 DIAGNOSIS — X58XXXA Exposure to other specified factors, initial encounter: Secondary | ICD-10-CM | POA: Insufficient documentation

## 2021-02-19 DIAGNOSIS — Z79899 Other long term (current) drug therapy: Secondary | ICD-10-CM | POA: Insufficient documentation

## 2021-02-19 DIAGNOSIS — T22252A Burn of second degree of left shoulder, initial encounter: Secondary | ICD-10-CM | POA: Insufficient documentation

## 2021-02-19 DIAGNOSIS — I10 Essential (primary) hypertension: Secondary | ICD-10-CM | POA: Insufficient documentation

## 2021-02-19 DIAGNOSIS — Y9389 Activity, other specified: Secondary | ICD-10-CM | POA: Insufficient documentation

## 2021-02-19 DIAGNOSIS — F17210 Nicotine dependence, cigarettes, uncomplicated: Secondary | ICD-10-CM | POA: Diagnosis not present

## 2021-02-19 DIAGNOSIS — E039 Hypothyroidism, unspecified: Secondary | ICD-10-CM | POA: Diagnosis not present

## 2021-02-19 MED ORDER — OXYCODONE-ACETAMINOPHEN 5-325 MG PO TABS
1.0000 | ORAL_TABLET | Freq: Once | ORAL | Status: AC
Start: 2021-02-19 — End: 2021-02-19
  Administered 2021-02-19: 1 via ORAL
  Filled 2021-02-19: qty 1

## 2021-02-19 MED ORDER — KETOROLAC TROMETHAMINE 60 MG/2ML IM SOLN
30.0000 mg | Freq: Once | INTRAMUSCULAR | Status: AC
  Administered 2021-02-19: 30 mg via INTRAMUSCULAR
  Filled 2021-02-19: qty 2

## 2021-02-19 MED ORDER — OXYCODONE-ACETAMINOPHEN 5-325 MG PO TABS: 1.0000 | ORAL_TABLET | Freq: Four times a day (QID) | ORAL | 0 refills | Status: DC | PRN

## 2021-02-19 MED ORDER — BACITRACIN ZINC 500 UNIT/GM EX OINT: 1.0000 "application " | TOPICAL_OINTMENT | Freq: Two times a day (BID) | CUTANEOUS | 0 refills | Status: DC

## 2021-02-19 MED ORDER — OXYCODONE-ACETAMINOPHEN 5-325 MG PO TABS
1.0000 | ORAL_TABLET | Freq: Once | ORAL | Status: AC
  Administered 2021-02-19: 1 via ORAL
  Filled 2021-02-19: qty 1

## 2021-02-19 MED ORDER — BACITRACIN ZINC 500 UNIT/GM EX OINT
TOPICAL_OINTMENT | Freq: Two times a day (BID) | CUTANEOUS | Status: DC
  Administered 2021-02-19: 1 via TOPICAL
  Filled 2021-02-19: qty 0.9

## 2021-02-19 NOTE — Discharge Instructions (Signed)
Change the bandages twice a day.  It is okay if the blisters pop.  You can also take ibuprofen in addition to the pain medication.
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
## 2021-02-20 ENCOUNTER — Telehealth: Payer: Self-pay | Admitting: Plastic Surgery

## 2021-02-20 NOTE — Telephone Encounter (Signed)
Patient referred to [place] from ED. I called patient to advise her from our system, her insurance appears to be out of network. I asked her to take a look at her insurance card and she confirmed her insurance card said [facility]. I explained to the patient that we would be considered out of her network but would still be happy to see her. Patient said she understood and from what her insurance has explained to her, we will send visit to insurance, they will send her the money for the visit and she will pay [place]. I confirmed patient's understanding that she may have a bill for out of network benefits and patient stated okay-she still wanted to come.

## 2021-02-27 ENCOUNTER — Other Ambulatory Visit: Payer: Self-pay

## 2021-02-27 ENCOUNTER — Ambulatory Visit (INDEPENDENT_AMBULATORY_CARE_PROVIDER_SITE_OTHER): Payer: BLUE CROSS/BLUE SHIELD | Admitting: Plastic Surgery

## 2021-02-27 ENCOUNTER — Encounter: Payer: Self-pay | Admitting: Plastic Surgery

## 2021-02-27 DIAGNOSIS — T22252A Burn of second degree of left shoulder, initial encounter: Secondary | ICD-10-CM
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
## 2021-03-02 ENCOUNTER — Telehealth: Payer: Self-pay

## 2021-03-02 NOTE — Telephone Encounter (Signed)
Patient called to say that the top part of her shoulder has yellowish puss coming out and a bad odor.  Patient said that she has been using bacitracin ointment and vaseline on it.  Please call.

## 2021-03-02 NOTE — Telephone Encounter (Signed)
Returned patients call. She has been applying the shoulder wound with bacitracin ointment, Vaseline and covering with Telfa pads. She has changed bandages 3 times today, and noticed with every change, the pad had pus with an odor along with a little bit of blood. This started this morning. Denies any fever, chills, nausea, nor vomiting. Advised patient to stop using the bacitracin ointment and coat shoulder wound with extra of Vaseline. Should anything change, or worsen, call our office back. Patient understood and agreed with plan.

## 2021-03-12 ENCOUNTER — Other Ambulatory Visit: Payer: Self-pay

## 2021-03-12 ENCOUNTER — Encounter: Payer: Self-pay | Admitting: Surgical

## 2021-03-12 ENCOUNTER — Ambulatory Visit: Payer: BLUE CROSS/BLUE SHIELD | Admitting: Surgical

## 2021-03-12 ENCOUNTER — Ambulatory Visit (INDEPENDENT_AMBULATORY_CARE_PROVIDER_SITE_OTHER): Payer: BLUE CROSS/BLUE SHIELD | Admitting: Surgical

## 2021-03-12 DIAGNOSIS — T22252A Burn of second degree of left shoulder, initial encounter: Secondary | ICD-10-CM
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
## 2022-03-13 ENCOUNTER — Emergency Department (HOSPITAL_COMMUNITY): Payer: BLUE CROSS/BLUE SHIELD

## 2022-03-13 ENCOUNTER — Emergency Department (HOSPITAL_COMMUNITY)
Admission: EM | Admit: 2022-03-13 | Discharge: 2022-03-13 | Disposition: A | Discharge status: Home / Self Care | Payer: BLUE CROSS/BLUE SHIELD | Attending: Emergency Medicine | Admitting: Emergency Medicine

## 2022-03-13 ENCOUNTER — Other Ambulatory Visit: Payer: Self-pay

## 2022-03-13 DIAGNOSIS — I10 Essential (primary) hypertension: Secondary | ICD-10-CM | POA: Diagnosis not present

## 2022-03-13 DIAGNOSIS — R0602 Shortness of breath: Secondary | ICD-10-CM | POA: Diagnosis not present

## 2022-03-13 DIAGNOSIS — R079 Chest pain, unspecified: Secondary | ICD-10-CM | POA: Insufficient documentation

## 2022-03-13 DIAGNOSIS — R110 Nausea: Secondary | ICD-10-CM | POA: Diagnosis not present

## 2022-03-13 DIAGNOSIS — Z79899 Other long term (current) drug therapy: Secondary | ICD-10-CM | POA: Diagnosis not present

## 2022-03-13 DIAGNOSIS — F17200 Nicotine dependence, unspecified, uncomplicated: Secondary | ICD-10-CM | POA: Insufficient documentation

## 2022-03-13 LAB — CBC
HCT: 39.4 % (ref 36.0–46.0)
Hemoglobin: 12.7 g/dL (ref 12.0–15.0)
MCH: 29.8 pg (ref 26.0–34.0)
MCHC: 32.2 g/dL (ref 30.0–36.0)
MCV: 92.5 fL (ref 80.0–100.0)
Platelets: 241 10*3/uL (ref 150–400)
RBC: 4.26 MIL/uL (ref 3.87–5.11)
RDW: 13.3 % (ref 11.5–15.5)
WBC: 9 10*3/uL (ref 4.0–10.5)
nRBC: 0 % (ref 0.0–0.2)

## 2022-03-13 LAB — BASIC METABOLIC PANEL
Anion gap: 10 (ref 5–15)
BUN: 13 mg/dL (ref 6–20)
CO2: 26 mmol/L (ref 22–32)
Calcium: 9.5 mg/dL (ref 8.9–10.3)
Chloride: 104 mmol/L (ref 98–111)
Creatinine, Ser: 0.77 mg/dL (ref 0.44–1.00)
GFR, Estimated: >60 mL/min (ref >60)
Glucose, Bld: 103 mg/dL — ABNORMAL HIGH (ref 70–99)
Potassium: 4.2 mmol/L (ref 3.5–5.1)
Sodium: 140 mmol/L (ref 135–145)

## 2022-03-13 LAB — TROPONIN I (HIGH SENSITIVITY)
Troponin I (High Sensitivity): 5 ng/L (ref <18)
Troponin I (High Sensitivity): 4 ng/L (ref <18)

## 2022-03-13 MED ORDER — IOHEXOL 350 MG/ML SOLN
100.0000 mL | Freq: Once | INTRAVENOUS | Status: AC | PRN
  Administered 2022-03-13: 100 mL via INTRAVENOUS

## 2022-03-13 MED ORDER — MELOXICAM 15 MG PO TABS: 15.0000 mg | ORAL_TABLET | Freq: Every day | ORAL | 0 refills | Status: AC

## 2022-03-13 NOTE — ED Triage Notes (Signed)
Pt reports L sided chest pain with radiation to L axilla since Thursday evening. Pain has been constant since then and has intermittent nasuea without vomiting. Compliant with antihypertensive meds but reports blood pressure continuing to rise and has bilateral ankle swelling since yesterday.

## 2022-03-13 NOTE — ED Provider Notes (Signed)
[facility] EMERGENCY DEPARTMENT Provider Note   CSN: [id number] Arrival date & time: 03/13/22  6301     History  Chief Complaint  Patient presents with   Chest Pain    Amy [name] is a 56 y.o. female.  Patient presents to the hospital with 2 days of left-sided chest pain with radiation to the left axillary region.  Patient states that the pain is sharp.  She rates it as moderate to severe.  It is not exacerbated by movement.  Nothing seems to make it better or worse.  The patient does endorse shortness of breath over the past 2 days.  Endorses mild nausea with no vomiting.  Denies abdominal pain, diarrhea, headache.  Patient had her right rotator cuff operated on in late May.  Patient does not endorse using any blood thinners.  Past medical history significant for recent rotator cuff surgery, hypertension, history of substance abuse, chronic back pain, history of brain aneurysm, anxiety, arthritis, depression.  Patient is a daily smoker  HPI     Home Medications Prior to Admission medications   Medication Sig Start Date End Date Taking? Authorizing Provider  meloxicam (MOBIC) 15 MG tablet Take 1 tablet (15 mg total) by mouth daily for 15 days. 03/13/22 03/28/22 Yes Darrick Grinder, PA-C  amitriptyline (ELAVIL) 100 MG tablet Take 1 tablet (100 mg total) by mouth at bedtime. 10/15/19   Arvilla Market, MD  amLODipine (NORVASC) 10 MG tablet Take 1 tablet (10 mg total) by mouth daily. 05/09/19   Claiborne Rigg, NP  bacitracin ointment Apply 1 application topically 2 (two) times daily. To the burned area 02/19/21   Gwyneth Sprout, MD  gabapentin (NEURONTIN) 300 MG capsule Take 900 mg by mouth at bedtime. 07/22/20   [provider]  levothyroxine (SYNTHROID) 88 MCG tablet Take 1 tablet (88 mcg total) by mouth daily. 03/05/20   Arvilla Market, MD  losartan (COZAAR) 100 MG tablet Take 1 tablet (100 mg total) by mouth daily. 05/11/19   Claiborne Rigg, NP  Magnesium Gluconate 250 MG TABS Take 1 tablet (250 mg total) by mouth 2 (two) times daily. 09/15/20   Lorelee New, PA-C  metoprolol succinate (TOPROL-XL) 100 MG 24 hr tablet Take 1 tablet (100 mg total) by mouth daily. Take with or immediately following a meal. 12/17/19   Arvilla Market, MD  oxyCODONE-acetaminophen (PERCOCET/ROXICET) 5-325 MG tablet Take 1 tablet by mouth every 6 (six) hours as needed for severe pain. 02/19/21   Gwyneth Sprout, MD  pantoprazole (PROTONIX) 20 MG tablet Take 20 mg by mouth daily. 08/04/20   [provider]  pseudoephedrine (SUDAFED) 120 MG 12 hr tablet Take 120 mg by mouth every 12 (twelve) hours as needed for congestion.    [provider]      Allergies    Adhesive [tape], Morphine and related, and Vicodin [hydrocodone-acetaminophen]    Review of Systems   Review of Systems  Constitutional:  Negative for fever.  Respiratory:  Positive for shortness of breath.   Cardiovascular:  Positive for chest pain.  Gastrointestinal:  Positive for nausea. Negative for abdominal pain, diarrhea and vomiting.  Genitourinary:  Negative for dysuria.  Musculoskeletal:  Negative for arthralgias.  Neurological:  Negative for headaches.    Physical Exam Updated Vital Signs BP 140/78   Pulse 81   Temp 98.4 F (36.9 C) (Oral)   Resp (!) 21   Ht 5\' 4"  (1.626 m)   Wt  81.6 kg   LMP 01/26/2000   SpO2 100%   BMI 30.90 kg/m  Physical Exam Vitals and nursing note reviewed.  Constitutional:      General: She is not in acute distress. HENT:     Head: Normocephalic and atraumatic.  Eyes:     Extraocular Movements: Extraocular movements intact.  Cardiovascular:     Rate and Rhythm: Normal rate and regular rhythm.     Heart sounds: Normal heart sounds.  Pulmonary:     Effort: Pulmonary effort is normal. No respiratory distress.     Breath sounds: Normal breath sounds.  Chest:     Chest wall: Tenderness present.      Comments: Mildly tender sternum to palpation Abdominal:     Palpations: Abdomen is soft.     Tenderness: There is no abdominal tenderness.  Musculoskeletal:        General: Normal range of motion.     Cervical back: Normal range of motion and neck supple.  Skin:    General: Skin is warm and dry.     Capillary Refill: Capillary refill takes less than 2 seconds.  Neurological:     Mental Status: She is alert.     ED Results / Procedures / Treatments   Labs (all labs ordered are listed, but only abnormal results are displayed) Labs Reviewed  BASIC METABOLIC PANEL - Abnormal; Notable for the following components:      Result Value   Glucose, Bld 103 (*)    All other components within normal limits  CBC  TROPONIN I (HIGH SENSITIVITY)  TROPONIN I (HIGH SENSITIVITY)    EKG EKG Interpretation  Date/Time:  Saturday March 13 2022 08:26:21 EDT Ventricular Rate:  97 PR Interval:  176 QRS Duration: 76 QT Interval:  358 QTC Calculation: 454 R Axis:   75 Text Interpretation: Normal sinus rhythm Cannot rule out Anterior infarct , age undetermined no significant change since June 2021 Confirmed by Pricilla Loveless [phone]) on 03/13/2022 8:49:15 AM  Radiology CT Angio Chest PE W and/or Wo Contrast  Result Date: 03/13/2022 CLINICAL DATA:  Left-sided chest pain radiates to the left axilla. EXAM: CT ANGIOGRAPHY CHEST WITH CONTRAST TECHNIQUE: Multidetector CT imaging of the chest was performed using the standard protocol during bolus administration of intravenous contrast. Multiplanar CT image reconstructions and MIPs were obtained to evaluate the vascular anatomy. RADIATION DOSE REDUCTION: This exam was performed according to the departmental dose-optimization program which includes automated exposure control, adjustment of the mA and/or kV according to patient size and/or use of iterative reconstruction technique. CONTRAST:  OMNIPAQUE IOHEXOL 350 MG/ML SOLN COMPARISON:  03/26/2015 FINDINGS:  Cardiovascular: Heart size upper normal. No substantial pericardial effusion. No thoracic aortic aneurysm. No substantial atherosclerosis of the thoracic aorta. There is no filling defect within the opacified pulmonary arteries to suggest the presence of an acute pulmonary embolus. Mediastinum/Nodes: No mediastinal lymphadenopathy. There is no hilar lymphadenopathy. The esophagus has normal imaging features. There is no axillary lymphadenopathy. Lungs/Pleura: Streaky subsegmental atelectasis is noted in the lower lungs bilaterally. Superimposed patchy ground-glass opacity in both lungs is nonspecific and may be related to compressive atelectasis although infectious/inflammatory process cannot be excluded. No substantial pleural effusion. Upper Abdomen: Unremarkable. Musculoskeletal: No worrisome lytic or sclerotic osseous abnormality. Review of the MIP images confirms the above findings. IMPRESSION: 1. No CT evidence for acute pulmonary embolus. 2. Patchy ground-glass opacity in both lungs is nonspecific and may be related to compressive atelectasis although infectious/inflammatory process cannot be excluded. Electronically  Signed   By: Kennith Center M.D.   On: 03/13/2022 10:55   DG Chest 2 View  Result Date: 03/13/2022 CLINICAL DATA:  Chest pain EXAM: CHEST - 2 VIEW COMPARISON:  Chest radiograph dated March 04, 2020 FINDINGS: The heart size and mediastinal contours are within normal limits. Both lungs are clear. The visualized skeletal structures are unremarkable. IMPRESSION: No active cardiopulmonary disease. Electronically Signed   By: Larose Hires D.O.   On: 03/13/2022 09:34    Procedures Procedures    Medications Ordered in ED Medications  iohexol (OMNIPAQUE) 350 MG/ML injection 100 mL (100 mLs Intravenous Contrast Given 03/13/22 1037)    ED Course/ Medical Decision Making/ A&P                           Medical Decision Making Amount and/or Complexity of Data Reviewed Labs:  ordered. Radiology: ordered.  Risk Prescription drug management.   This patient presents to the ED for concern of chest pain, this involves an extensive number of treatment options, and is a complaint that carries with it a high risk of complications and morbidity.  The differential diagnosis includes PE, ACS, musculoskeletal pain, pneumonia, and others   Co morbidities that complicate the patient evaluation  History of chronic pain, recent shoulder surgery   Additional history obtained:   External records from outside source obtained and reviewed including notes showing rotator cuff surgery on the right side on May 23   Lab Tests:  I Ordered, and personally interpreted labs.  The pertinent results include: Initial troponin 5, glucose 103, CBC grossly normal, repeat troponin 4   Imaging Studies ordered:  I ordered imaging studies including chest x-ray and CT angio chest PE study I independently visualized and interpreted imaging which showed no active disease on chest x-ray.  No CT evidence for acute pulmonary embolism.  Patchy groundglass opacity in both lungs is nonspecific which may be related to compressive atelectasis but cannot rule out infectious/inflammatory process I agree with the radiologist interpretation   Cardiac Monitoring: / EKG:  The patient was maintained on a cardiac monitor.  I personally viewed and interpreted the cardiac monitored which showed an underlying rhythm of: Normal sinus rhythm   Test / Admission - Considered:  No sign of pulmonary embolism on imaging.  With the recent surgery and history of smoking felt need to rule out PE with CT. EKG and troponins reassuring for no sign of ACS.  Very low clinical suspicion at this time for angina/ACS  This seems consistent with musculoskeletal pain.  Mild tenderness to palpation to the chest.  Lung fields are clear, combined with clear chest x-ray feel no sign of pneumonia at this time.  Plan to  discharge home.  Patient may take anti-inflammatory medication.  I recommend follow-up with her PCP.  Return precautions provided including worsening chest pain, shortness of breath, or other life-threatening conditions        Final Clinical Impression(s) / ED Diagnoses Final diagnoses:  Chest pain, unspecified type    Rx / DC Orders ED Discharge Orders          Ordered    meloxicam (MOBIC) 15 MG tablet  Daily        03/13/22 1143              Pamala Duffel 03/13/22 1143    Pricilla Loveless, MD 03/19/22 [phone]

## 2022-07-30 IMAGING — CT CT HEAD W/O CM
3 series · 16 of 47 positions shown, 19 images · non-contrast
Comparison: 06/23/2020.

CLINICAL DATA: Pt c/o slip and fall from standing and struck head
on floor [REDACTED] with continuing headache since. Denies any injury.

EXAM:
CT HEAD WITHOUT CONTRAST
TECHNIQUE: Contiguous axial images were obtained from the base of the skull
through the vertex without intravenous contrast.

[Series 2: head wo · axial · 0.44mm/px · z∈[+1501,+1646]mm · 10 of 35 slices shown, 13 images]
[im 3/35  brain]
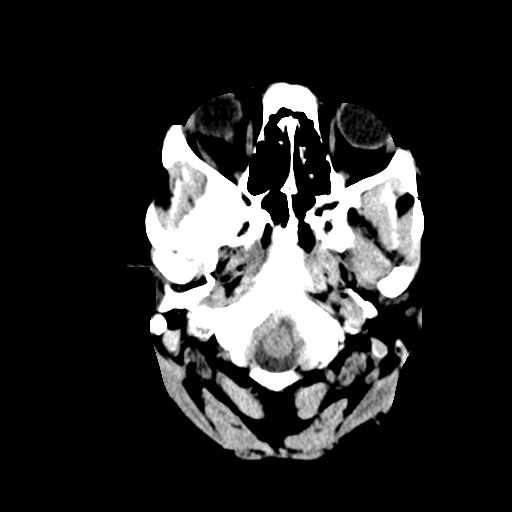
[im 3/35  bone]
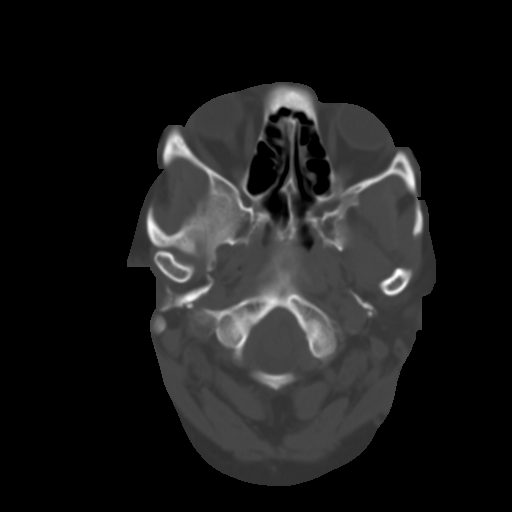
[im 6/35  brain]
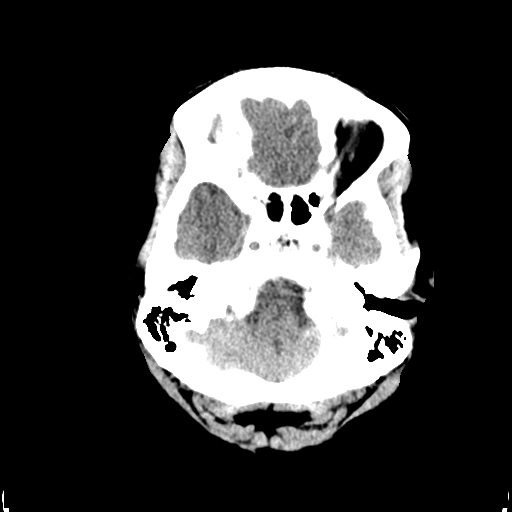
[im 10/35  brain]
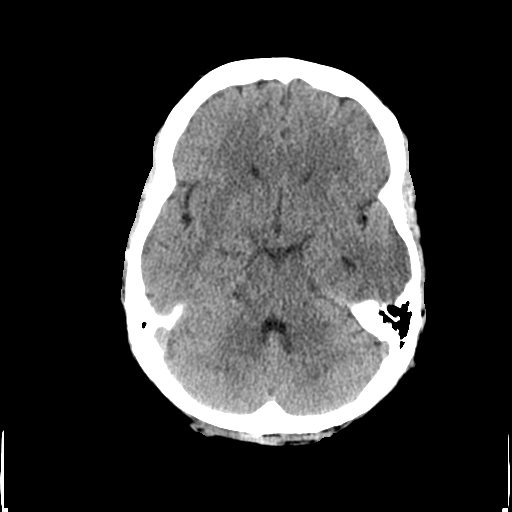
[im 12/35  brain]
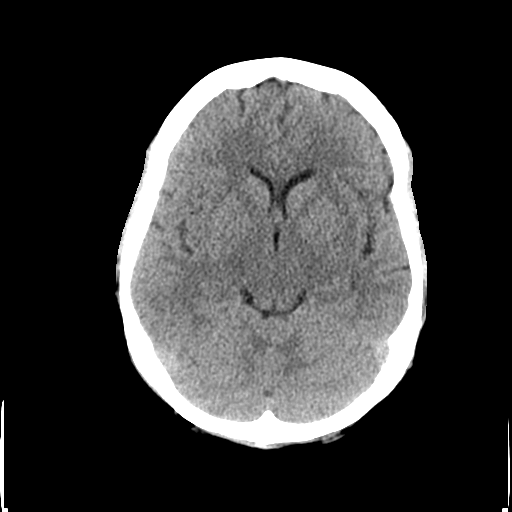
[im 16/35  brain]
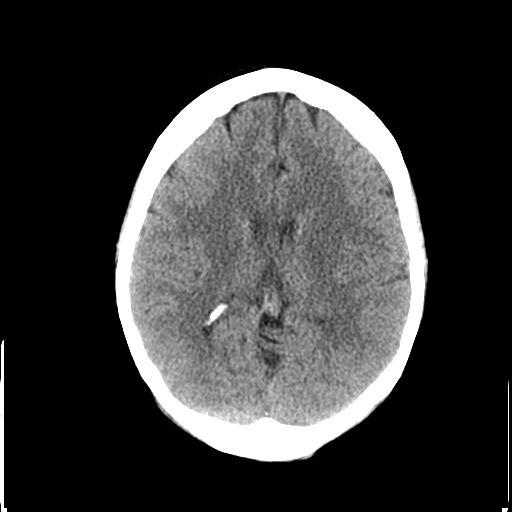
[im 16/35  bone]
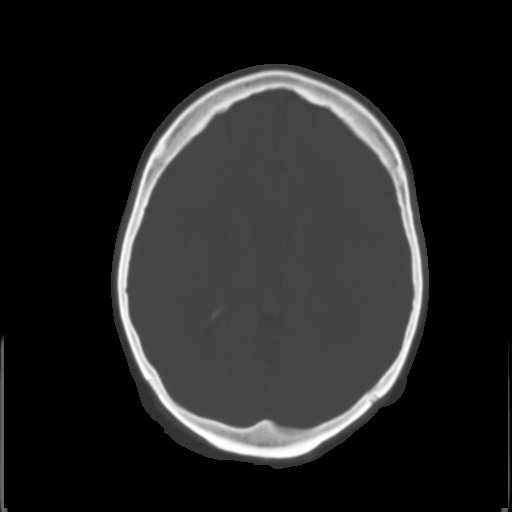
[im 19/35  brain]
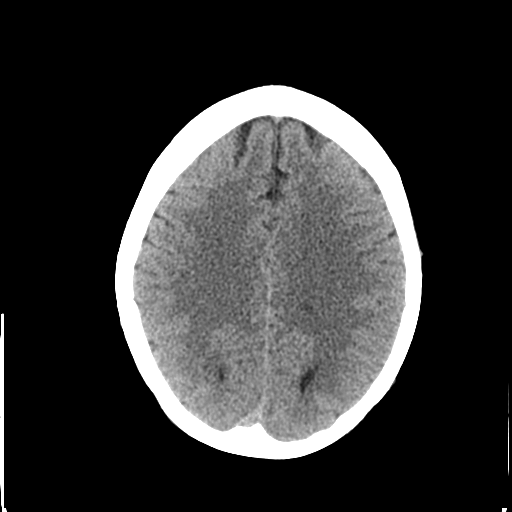
[im 23/35  brain]
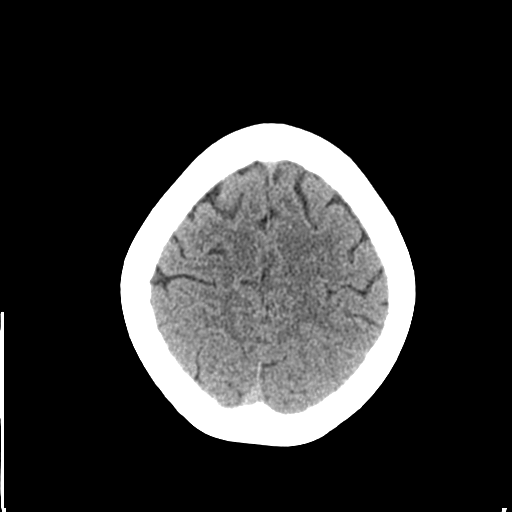
[im 26/35  brain]
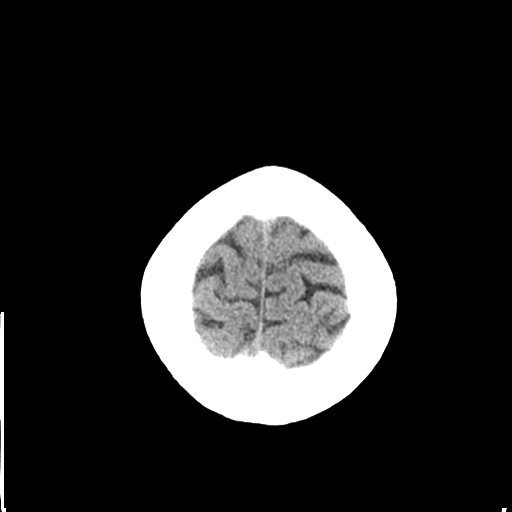
[im 29/35  brain]
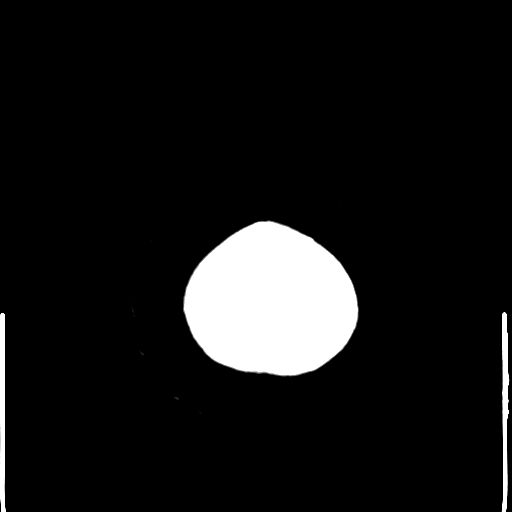
[im 29/35  bone]
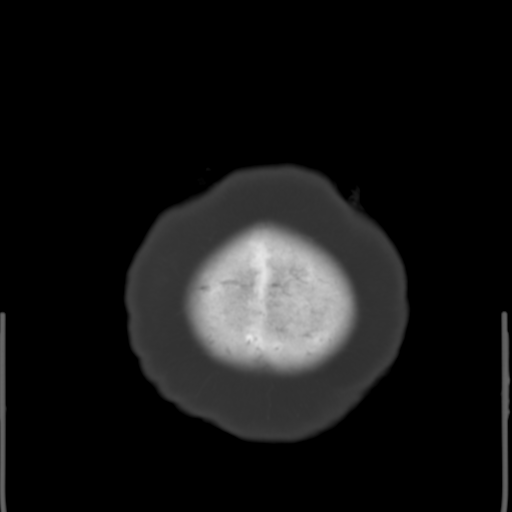
[im 32/35  brain]
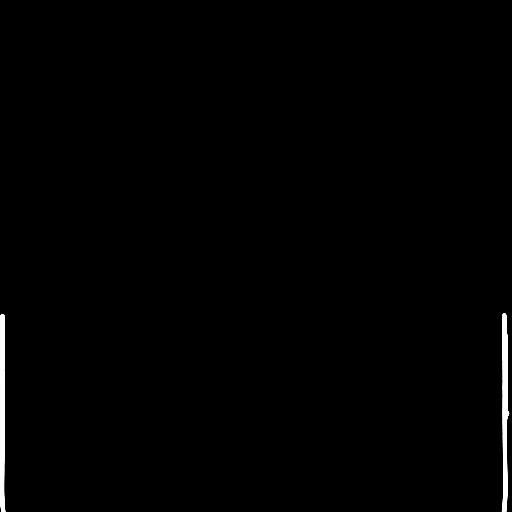

[Series 5: coronal soft tissue · coronal · 0.33mm/px · 3 of 75 slices shown]
[im 25/75  brain]
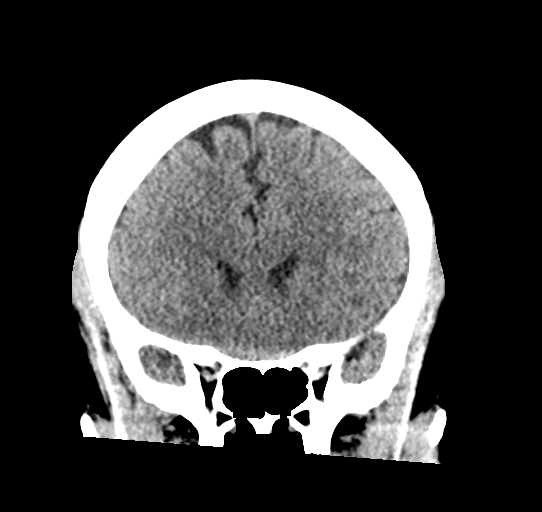
[im 33/75  brain]
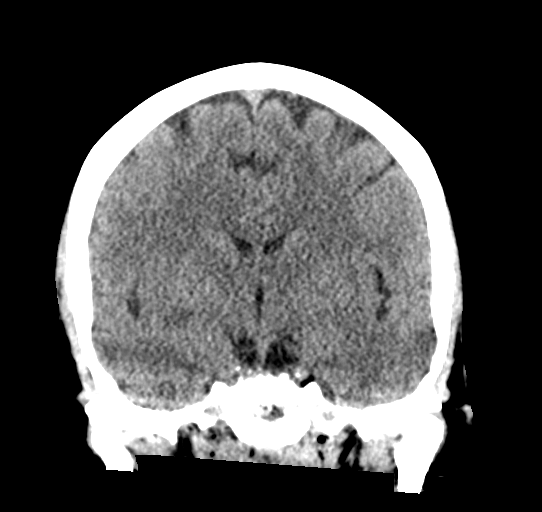
[im 42/75  brain]
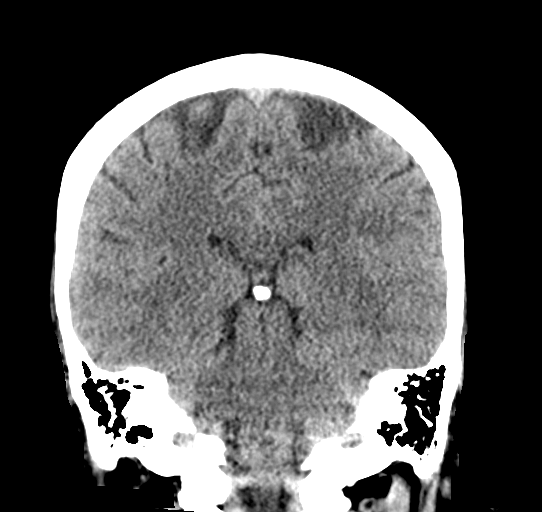

[Series 6: sagittal soft tissue · sagittal · 0.34mm/px · 3 of 60 slices shown]
[im 20/60  brain]
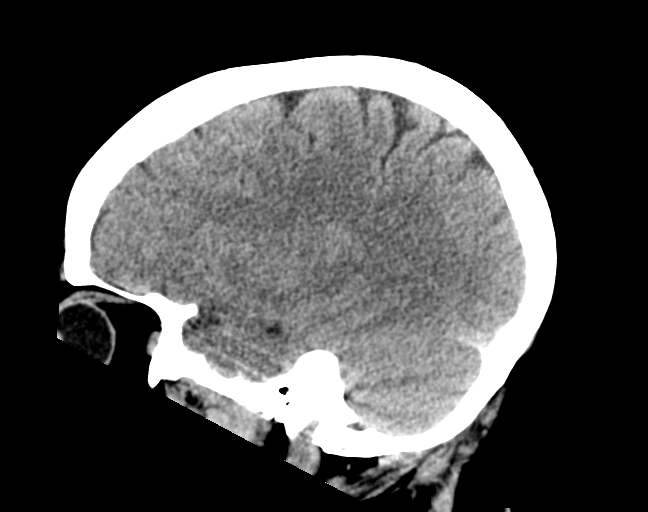
[im 30/60  brain]
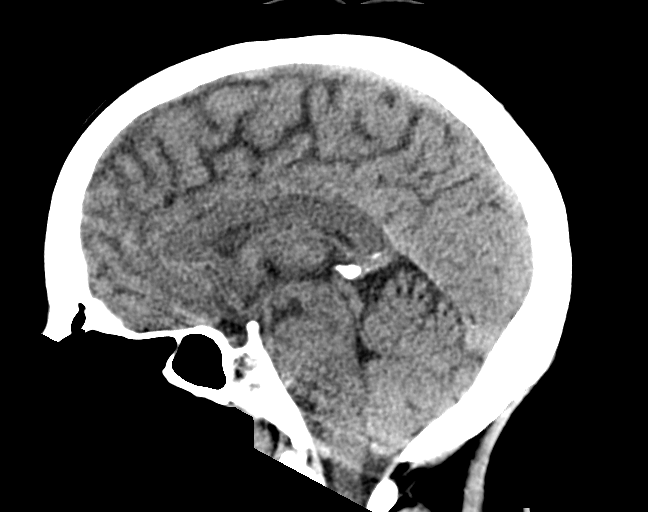
[im 40/60  brain]
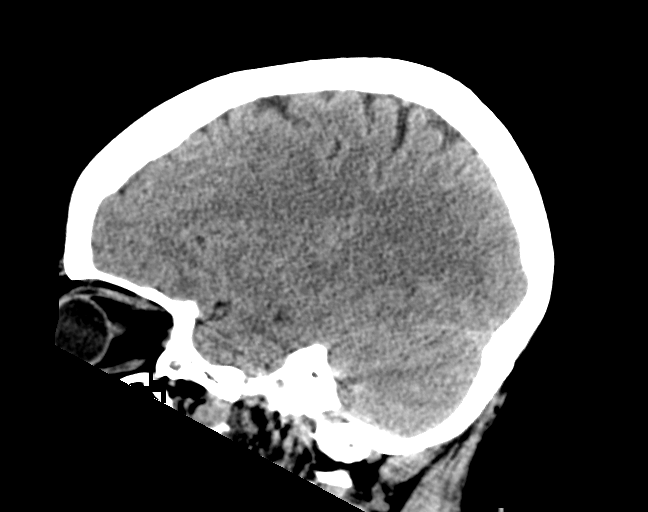

[16 of 47 positions shown; findings below may reference images not displayed]

FINDINGS: Brain: No evidence of acute infarction, hemorrhage, hydrocephalus,
extra-axial collection or mass lesion/mass effect.

Vascular: No hyperdense vessel or unexpected calcification.

Skull: Normal. Negative for fracture or focal lesion.

Sinuses/Orbits: Visualized globes and orbits are unremarkable.
Visualized sinuses are clear.

Other: None.
IMPRESSION: Normal unenhanced CT scan of the brain.

## 2022-09-28 DIAGNOSIS — G5601 Carpal tunnel syndrome, right upper limb: Secondary | ICD-10-CM | POA: Diagnosis not present

## 2022-10-07 DIAGNOSIS — F411 Generalized anxiety disorder: Secondary | ICD-10-CM | POA: Diagnosis not present

## 2022-10-07 DIAGNOSIS — R69 Illness, unspecified: Secondary | ICD-10-CM | POA: Diagnosis not present

## 2022-10-18 ENCOUNTER — Emergency Department (HOSPITAL_COMMUNITY): Payer: 59

## 2022-10-18 ENCOUNTER — Emergency Department (HOSPITAL_COMMUNITY)
Admission: EM | Admit: 2022-10-18 | Discharge: 2022-10-18 | Disposition: A | Discharge status: Home / Self Care | Payer: 59 | Attending: Emergency Medicine | Admitting: Emergency Medicine

## 2022-10-18 ENCOUNTER — Encounter (HOSPITAL_COMMUNITY): Payer: Self-pay

## 2022-10-18 DIAGNOSIS — Z1152 Encounter for screening for COVID-19: Secondary | ICD-10-CM | POA: Insufficient documentation

## 2022-10-18 DIAGNOSIS — I10 Essential (primary) hypertension: Secondary | ICD-10-CM | POA: Diagnosis not present

## 2022-10-18 DIAGNOSIS — R519 Headache, unspecified: Secondary | ICD-10-CM | POA: Diagnosis not present

## 2022-10-18 DIAGNOSIS — Z8679 Personal history of other diseases of the circulatory system: Secondary | ICD-10-CM | POA: Diagnosis not present

## 2022-10-18 DIAGNOSIS — G43809 Other migraine, not intractable, without status migrainosus: Secondary | ICD-10-CM | POA: Diagnosis not present

## 2022-10-18 DIAGNOSIS — I671 Cerebral aneurysm, nonruptured: Secondary | ICD-10-CM | POA: Diagnosis not present

## 2022-10-18 LAB — CBC WITH DIFFERENTIAL/PLATELET
Abs Immature Granulocytes: 0.05 10*3/uL (ref 0.00–0.07)
Basophils Absolute: 0 10*3/uL (ref 0.0–0.1)
Basophils Relative: 0 %
Eosinophils Absolute: 0.1 10*3/uL (ref 0.0–0.5)
Eosinophils Relative: 1 %
HCT: 42.3 % (ref 36.0–46.0)
Hemoglobin: 13.6 g/dL (ref 12.0–15.0)
Immature Granulocytes: 1 %
Lymphocytes Relative: 33 %
Lymphs Abs: 3 10*3/uL (ref 0.7–4.0)
MCH: 29.4 pg (ref 26.0–34.0)
MCHC: 32.2 g/dL (ref 30.0–36.0)
MCV: 91.4 fL (ref 80.0–100.0)
Monocytes Absolute: 0.5 10*3/uL (ref 0.1–1.0)
Monocytes Relative: 5 %
Neutro Abs: 5.3 10*3/uL (ref 1.7–7.7)
Neutrophils Relative %: 60 %
Platelets: 284 10*3/uL (ref 150–400)
RBC: 4.63 MIL/uL (ref 3.87–5.11)
RDW: 13.2 % (ref 11.5–15.5)
WBC: 8.9 10*3/uL (ref 4.0–10.5)
nRBC: 0 % (ref 0.0–0.2)

## 2022-10-18 LAB — TROPONIN I (HIGH SENSITIVITY)
Troponin I (High Sensitivity): 2 ng/L (ref <18)
Troponin I (High Sensitivity): 3 ng/L (ref <18)

## 2022-10-18 LAB — BASIC METABOLIC PANEL
Anion gap: 10 (ref 5–15)
BUN: 8 mg/dL (ref 6–20)
CO2: 26 mmol/L (ref 22–32)
Calcium: 8.8 mg/dL — ABNORMAL LOW (ref 8.9–10.3)
Chloride: 103 mmol/L (ref 98–111)
Creatinine, Ser: 0.72 mg/dL (ref 0.44–1.00)
GFR, Estimated: >60 mL/min (ref >60)
Glucose, Bld: 89 mg/dL (ref 70–99)
Potassium: 3.3 mmol/L — ABNORMAL LOW (ref 3.5–5.1)
Sodium: 139 mmol/L (ref 135–145)

## 2022-10-18 LAB — I-STAT CHEM 8, ED
BUN: 7 mg/dL (ref 6–20)
Calcium, Ion: 1.13 mmol/L — ABNORMAL LOW (ref 1.15–1.40)
Chloride: 101 mmol/L (ref 98–111)
Creatinine, Ser: 0.7 mg/dL (ref 0.44–1.00)
Glucose, Bld: 87 mg/dL (ref 70–99)
HCT: 42 % (ref 36.0–46.0)
Hemoglobin: 14.3 g/dL (ref 12.0–15.0)
Potassium: 3.8 mmol/L (ref 3.5–5.1)
Sodium: 142 mmol/L (ref 135–145)
TCO2: 30 mmol/L (ref 22–32)

## 2022-10-18 LAB — RESP PANEL BY RT-PCR (RSV, FLU A&B, COVID)  RVPGX2
Influenza A by PCR: NEGATIVE
Influenza B by PCR: NEGATIVE
Resp Syncytial Virus by PCR: NEGATIVE
SARS Coronavirus 2 by RT PCR: NEGATIVE

## 2022-10-18 MED ORDER — METOCLOPRAMIDE HCL 5 MG/ML IJ SOLN
10.0000 mg | INTRAMUSCULAR | Status: AC
  Administered 2022-10-18: 10 mg via INTRAVENOUS
  Filled 2022-10-18: qty 2

## 2022-10-18 MED ORDER — KETOROLAC TROMETHAMINE 15 MG/ML IJ SOLN
15.0000 mg | Freq: Once | INTRAMUSCULAR | Status: AC
  Administered 2022-10-18: 15 mg via INTRAVENOUS
  Filled 2022-10-18: qty 1

## 2022-10-18 MED ORDER — DIPHENHYDRAMINE HCL 25 MG PO TABS: 25.0000 mg | ORAL_TABLET | Freq: Four times a day (QID) | ORAL | 0 refills | Status: AC | PRN

## 2022-10-18 MED ORDER — IOHEXOL 350 MG/ML SOLN
75.0000 mL | Freq: Once | INTRAVENOUS | Status: AC | PRN
  Administered 2022-10-18: 75 mL via INTRAVENOUS

## 2022-10-18 MED ORDER — ACETAMINOPHEN 500 MG PO TABS
1000.0000 mg | ORAL_TABLET | ORAL | Status: AC
  Administered 2022-10-18: 1000 mg via ORAL
  Filled 2022-10-18: qty 2

## 2022-10-18 MED ORDER — DIPHENHYDRAMINE HCL 50 MG/ML IJ SOLN
25.0000 mg | Freq: Once | INTRAMUSCULAR | Status: AC
  Administered 2022-10-18: 25 mg via INTRAVENOUS
  Filled 2022-10-18: qty 1

## 2022-10-18 MED ORDER — METOCLOPRAMIDE HCL 10 MG PO TABS: 10.0000 mg | ORAL_TABLET | Freq: Four times a day (QID) | ORAL | 0 refills | Status: AC

## 2022-10-18 NOTE — ED Provider Triage Note (Signed)
Emergency Medicine Provider Triage Evaluation Note  Amy [name] , a 57 y.o. female  was evaluated in triage.  Pt complains of weakness and headache.  States she has a history of migraines and takes Elavil and that this feels like previous migraines.  Patient states she has right-sided paraspinal tenderness that hurts when she moves her neck.  She feels she is weak everywhere and that there have been sick contacts at home.  Patient states she feels she has a pressure in her chest.  Patient states she had 1 episode of emesis yesterday that was watery.  Patient endorsed photophobia.  Patient denies shortness of breath, abdominal pain, diarrhea, fever, blurry vision  Review of Systems  Positive: See HPI Negative: See HPI  Physical Exam  BP (!) 164/93 (BP Location: Left Arm)   Pulse 95   Temp 98.8 F (37.1 C) (Oral)   Resp 17   LMP 01/26/2000   SpO2 99%  Gen:   Awake, no distress   Resp:  Normal effort  MSK:   Moves extremities without difficulty  Other:  Photophobic, no neurodeficit noted on exam, AOx3  Medical Decision Making  Medically screening exam initiated at 12:04 PM.  Appropriate orders placed.  Sheilia LOYAL RUDY was informed that the remainder of the evaluation will be completed by another provider, this initial triage assessment does not replace that evaluation, and the importance of remaining in the ED until their evaluation is complete.  Workup initiated.  Patient is not in any distress at this time.   Chuck Hint, PA-C 10/18/22 1206

## 2022-10-18 NOTE — Discharge Instructions (Addendum)
You were seen for your headache in the emergency department.   At home, please take your home headache medication and tylenol. You may also take the reglan and benadryl if your headache persists. Do not take before driving or operating heavy machinery since they can make you drowsy.    Check your MyChart online for the results of any tests that had not resulted by the time you left the emergency department.   Follow-up with your primary doctor in 2-3 days regarding your visit.    Return immediately to the emergency department if you experience any of the following: worsening headache, numbness or weakness, or any other concerning symptoms.    Thank you for visiting our Emergency Department. It was a pleasure taking care of you today.

## 2022-10-18 NOTE — ED Triage Notes (Signed)
Pt presents with c/o headache for 6 days, hx of migraines. Pt reports light sensitivity and nausea at this time.
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
## 2023-10-07 ENCOUNTER — Ambulatory Visit: Payer: Medicaid Other | Admitting: Physical Medicine and Rehabilitation

## 2023-10-13 ENCOUNTER — Ambulatory Visit (INDEPENDENT_AMBULATORY_CARE_PROVIDER_SITE_OTHER): Payer: Medicaid Other | Admitting: Physical Medicine and Rehabilitation

## 2023-10-13 ENCOUNTER — Encounter: Payer: Self-pay | Admitting: Physical Medicine and Rehabilitation

## 2023-10-13 DIAGNOSIS — M47812 Spondylosis without myelopathy or radiculopathy, cervical region: Secondary | ICD-10-CM | POA: Diagnosis not present

## 2023-10-13 DIAGNOSIS — G894 Chronic pain syndrome: Secondary | ICD-10-CM

## 2023-10-13 DIAGNOSIS — M5412 Radiculopathy, cervical region: Secondary | ICD-10-CM

## 2023-10-13 DIAGNOSIS — R202 Paresthesia of skin: Secondary | ICD-10-CM | POA: Diagnosis not present

## 2023-10-13 MED ORDER — DIAZEPAM 5 MG PO TABS: ORAL_TABLET | ORAL | 0 refills | Status: AC

## 2023-10-13 NOTE — Progress Notes (Signed)
Numbness / tingling in both arms.  She had ROT surg in 23 she said ever since surg she has had pain.  L > R  Depending on how she lays pain can be on both sides.  She has had a ESI injection in her neck before it lasted about 2 weeks.
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
## 2023-11-01 ENCOUNTER — Encounter: Payer: Medicaid Other | Admitting: Physical Medicine and Rehabilitation

## 2023-11-01 ENCOUNTER — Telehealth: Payer: Self-pay | Admitting: Physical Medicine and Rehabilitation

## 2023-11-01 NOTE — Telephone Encounter (Signed)
Patient called advised she has the Flu and will need to reschedule her appointment. The number to contact patient is [phone]

## 2023-11-10 ENCOUNTER — Encounter: Payer: Medicaid Other | Admitting: Physical Medicine and Rehabilitation

## 2023-11-17 ENCOUNTER — Other Ambulatory Visit: Payer: Self-pay

## 2023-11-17 ENCOUNTER — Ambulatory Visit: Payer: Medicaid Other | Admitting: Physical Medicine and Rehabilitation

## 2023-11-17 DIAGNOSIS — M5412 Radiculopathy, cervical region: Secondary | ICD-10-CM | POA: Diagnosis not present

## 2023-11-17 MED ORDER — METHYLPREDNISOLONE ACETATE 40 MG/ML IJ SUSP
40.0000 mg | Freq: Once | INTRAMUSCULAR | Status: AC
  Administered 2023-11-17: 40 mg

## 2023-11-17 NOTE — Progress Notes (Signed)
 Pain Score----8 No Blood Thinners No Allergies to Contrast Dye

## 2023-11-17 NOTE — Progress Notes (Signed)
 Amy [name] - 58 y.o. female MRN [id number]  Date of birth: 30-Jun-1966  Office Visit Note: Visit Date: 11/17/2023 PCP: Judd Lien, PA-C Referred by: Judd Lien, PA-C  Subjective: Chief Complaint  Patient presents with   Neck - Pain   HPI:  Amy [name] is a 58 y.o. female who comes in today at the request of Ellin Goodie, FNP for planned Left C7-T1 Cervical Interlaminar epidural steroid injection with fluoroscopic guidance.  The patient has failed conservative care including home exercise, medications, time and activity modification.  This injection will be diagnostic and hopefully therapeutic.  Please see requesting physician notes for further details and justification.   ROS Otherwise per HPI.  Assessment & Plan: Visit Diagnoses:    ICD-10-CM   1. Radiculopathy, cervical region  M54.12 XR C-ARM NO REPORT    Epidural Steroid injection    methylPREDNISolone acetate (DEPO-MEDROL) injection 40 mg      Plan: No additional findings.   Meds & Orders:  Meds ordered this encounter  Medications   methylPREDNISolone acetate (DEPO-MEDROL) injection 40 mg    Orders Placed This Encounter  Procedures   XR C-ARM NO REPORT   Epidural Steroid injection    Follow-up: Return if symptoms worsen or fail to improve.   Procedures: No procedures performed  Cervical Epidural Steroid Injection - Interlaminar Approach with Fluoroscopic Guidance  Patient: Amy [name]      Date of Birth: [birth year] MRN: [id number] PCP: Judd Lien, PA-C      Visit Date: 11/17/2023   Universal Protocol:    Date/Time: 02/27/252:32 PM  Consent Given By: the patient  Position: PRONE  Additional Comments: Vital signs were monitored before and after the procedure. Patient was prepped and draped in the usual sterile fashion. The correct patient, procedure, and site was verified.   Injection Procedure Details:   Procedure diagnoses: Radiculopathy, cervical region [M54.12]    Meds  Administered:  Meds ordered this encounter  Medications   methylPREDNISolone acetate (DEPO-MEDROL) injection 40 mg     Laterality: Left  Location/Site: C7-T1  Needle: 3.5 in., 20 ga. Tuohy  Needle Placement: Paramedian epidural space  Findings:  -Comments: Excellent flow of contrast into the epidural space.  Procedure Details: Using a paramedian approach from the side mentioned above, the region overlying the inferior lamina was localized under fluoroscopic visualization and the soft tissues overlying this structure were infiltrated with 4 ml. of 1% Lidocaine without Epinephrine. A # 20 gauge, Tuohy needle was inserted into the epidural space using a paramedian approach.  The epidural space was localized using loss of resistance along with contralateral oblique bi-planar fluoroscopic views.  After negative aspirate for air, blood, and CSF, a 2 ml. volume of Isovue-250 was injected into the epidural space and the flow of contrast was observed. Radiographs were obtained for documentation purposes.   The injectate was administered into the level noted above.  Additional Comments:  No complications occurred Dressing: 2 x 2 sterile gauze and Band-Aid    Post-procedure details: Patient was observed during the procedure. Post-procedure instructions were reviewed.  Patient left the clinic in stable condition.   Clinical History: EXAM:  MRI CERVICAL SPINE WITHOUT CONTRAST   TECHNIQUE:  Multiplanar, multisequence MR imaging of the cervical spine was  performed. No intravenous contrast was administered.   COMPARISON:  Comparison made with previous MRI from 09/11/2006.   FINDINGS:  Alignment: Straightening of the normal cervical lordosis. Underlying  trace dextroscoliosis. No listhesis.  Vertebrae: Vertebral body height maintained without acute or chronic  fracture. Bone marrow signal intensity within normal limits. No  discrete or worrisome osseous lesions or abnormal marrow  edema.   Cord: Normal signal and morphology.   Posterior Fossa, vertebral arteries, paraspinal tissues:  Unremarkable.   Disc levels:   C2-C3: Mild left-sided uncovertebral spurring without significant  disc bulge. Mild left greater than right facet hypertrophy. No  spinal stenosis. Mild left C3 foraminal narrowing. Right neural  foramina remains patent.   C3-C4: Negative interspace. Mild facet hypertrophy. No canal or  foraminal stenosis.   C4-C5: Negative interspace. Mild left-sided facet hypertrophy. No  spinal stenosis. Mild left C5 foraminal narrowing. Right neural  foramen remains patent.   C5-C6: Minimal annular disc bulge with uncovertebral spurring. No  spinal stenosis. Foramina remain patent.   C6-C7: Disc desiccation without significant disc bulge. No canal or  foraminal stenosis.   C7-T1: Negative interspace. Mild left-sided facet hypertrophy. No  spinal stenosis. Foramina remain adequately patent.   IMPRESSION:  1. Mild left-sided uncovertebral and facet hypertrophy at C2-3 and  C4-5 with resultant mild left C3 and C5 foraminal stenosis.  2. Additional minor for age spondylosis elsewhere within the  cervical spine as above. No other significant stenosis or neural  impingement.    Electronically Signed    By: Rise Mu M.D.    On: 02/07/2023 05:08     Objective:  VS:  HT:    WT:   BMI:     BP:   HR: bpm  TEMP: ( )  RESP:  Physical Exam Vitals and nursing note reviewed.  Constitutional:      General: She is not in acute distress.    Appearance: Normal appearance. She is not ill-appearing.  HENT:     Head: Normocephalic and atraumatic.     Right Ear: External ear normal.     Left Ear: External ear normal.  Eyes:     Extraocular Movements: Extraocular movements intact.  Cardiovascular:     Rate and Rhythm: Normal rate.     Pulses: Normal pulses.  Musculoskeletal:     Cervical back: Tenderness present. No rigidity.     Right lower  leg: No edema.     Left lower leg: No edema.     Comments: Patient has good strength in the upper extremities including 5 out of 5 strength in wrist extension long finger flexion and APB.  There is no atrophy of the hands intrinsically.  There is a negative Hoffmann's test.   Lymphadenopathy:     Cervical: No cervical adenopathy.  Skin:    Findings: No erythema, lesion or rash.  Neurological:     General: No focal deficit present.     Mental Status: She is alert and oriented to person, place, and time.     Sensory: No sensory deficit.     Motor: No weakness or abnormal muscle tone.     Coordination: Coordination normal.  Psychiatric:        Mood and Affect: Mood normal.        Behavior: Behavior normal.      Imaging: No results found.

## 2023-11-17 NOTE — Patient Instructions (Signed)

## 2023-11-17 NOTE — Procedures (Signed)
 Cervical Epidural Steroid Injection - Interlaminar Approach with Fluoroscopic Guidance  Patient: Amy [name]      Date of Birth: March 29, 1966 MRN: [id number] PCP: Judd Lien, PA-C      Visit Date: 11/17/2023   Universal Protocol:    Date/Time: 02/27/252:32 PM  Consent Given By: the patient  Position: PRONE  Additional Comments: Vital signs were monitored before and after the procedure. Patient was prepped and draped in the usual sterile fashion. The correct patient, procedure, and site was verified.   Injection Procedure Details:   Procedure diagnoses: Radiculopathy, cervical region [M54.12]    Meds Administered:  Meds ordered this encounter  Medications   methylPREDNISolone acetate (DEPO-MEDROL) injection 40 mg     Laterality: Left  Location/Site: C7-T1  Needle: 3.5 in., 20 ga. Tuohy  Needle Placement: Paramedian epidural space  Findings:  -Comments: Excellent flow of contrast into the epidural space.  Procedure Details: Using a paramedian approach from the side mentioned above, the region overlying the inferior lamina was localized under fluoroscopic visualization and the soft tissues overlying this structure were infiltrated with 4 ml. of 1% Lidocaine without Epinephrine. A # 20 gauge, Tuohy needle was inserted into the epidural space using a paramedian approach.  The epidural space was localized using loss of resistance along with contralateral oblique bi-planar fluoroscopic views.  After negative aspirate for air, blood, and CSF, a 2 ml. volume of Isovue-250 was injected into the epidural space and the flow of contrast was observed. Radiographs were obtained for documentation purposes.   The injectate was administered into the level noted above.  Additional Comments:  No complications occurred Dressing: 2 x 2 sterile gauze and Band-Aid    Post-procedure details: Patient was observed during the procedure. Post-procedure instructions were  reviewed.  Patient left the clinic in stable condition.

## 2023-11-30 ENCOUNTER — Telehealth: Payer: Self-pay | Admitting: Physical Medicine and Rehabilitation

## 2023-11-30 NOTE — Telephone Encounter (Signed)
 Pt called requesting a call to set an appt with Dr Alvester Morin. Please call pt at [phone].

## 2023-12-06 ENCOUNTER — Ambulatory Visit: Admitting: Physical Medicine and Rehabilitation

## 2023-12-06 ENCOUNTER — Encounter: Payer: Self-pay | Admitting: Physical Medicine and Rehabilitation

## 2023-12-06 DIAGNOSIS — R202 Paresthesia of skin: Secondary | ICD-10-CM | POA: Diagnosis not present

## 2023-12-06 DIAGNOSIS — M542 Cervicalgia: Secondary | ICD-10-CM

## 2023-12-06 DIAGNOSIS — M7918 Myalgia, other site: Secondary | ICD-10-CM

## 2023-12-06 DIAGNOSIS — G894 Chronic pain syndrome: Secondary | ICD-10-CM | POA: Diagnosis not present

## 2023-12-06 NOTE — Progress Notes (Signed)
 Pain Scale   Average Pain 8 Patient advising her pain radiates to her left shoulder    +Driver, -BT, -Dye Allergies.
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
## 2023-12-14 ENCOUNTER — Ambulatory Visit: Admitting: Physical Therapy

## 2023-12-20 ENCOUNTER — Ambulatory Visit: Attending: Physical Medicine and Rehabilitation | Admitting: Physical Therapy

## 2023-12-20 DIAGNOSIS — M542 Cervicalgia: Secondary | ICD-10-CM

## 2023-12-20 DIAGNOSIS — M6281 Muscle weakness (generalized): Secondary | ICD-10-CM | POA: Diagnosis present

## 2023-12-20 DIAGNOSIS — R202 Paresthesia of skin: Secondary | ICD-10-CM | POA: Insufficient documentation

## 2023-12-20 DIAGNOSIS — M25512 Pain in left shoulder: Secondary | ICD-10-CM | POA: Diagnosis present

## 2023-12-20 DIAGNOSIS — M25511 Pain in right shoulder: Secondary | ICD-10-CM | POA: Insufficient documentation

## 2023-12-20 DIAGNOSIS — M5412 Radiculopathy, cervical region: Secondary | ICD-10-CM

## 2023-12-20 DIAGNOSIS — M7918 Myalgia, other site: Secondary | ICD-10-CM | POA: Insufficient documentation

## 2023-12-20 DIAGNOSIS — G8929 Other chronic pain: Secondary | ICD-10-CM | POA: Diagnosis present

## 2023-12-20 DIAGNOSIS — G894 Chronic pain syndrome: Secondary | ICD-10-CM | POA: Diagnosis not present
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
## 2023-12-26 ENCOUNTER — Ambulatory Visit: Admitting: Physical Therapy

## 2023-12-26 DIAGNOSIS — G8929 Other chronic pain: Secondary | ICD-10-CM

## 2023-12-26 DIAGNOSIS — M6281 Muscle weakness (generalized): Secondary | ICD-10-CM | POA: Diagnosis not present

## 2023-12-26 DIAGNOSIS — M542 Cervicalgia: Secondary | ICD-10-CM

## 2023-12-26 DIAGNOSIS — M5412 Radiculopathy, cervical region: Secondary | ICD-10-CM
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
## 2023-12-28 ENCOUNTER — Ambulatory Visit: Admitting: Physical Therapy

## 2024-01-02 ENCOUNTER — Ambulatory Visit: Admitting: Physical Therapy

## 2024-01-02 DIAGNOSIS — M6281 Muscle weakness (generalized): Secondary | ICD-10-CM | POA: Diagnosis not present

## 2024-01-02 DIAGNOSIS — M542 Cervicalgia: Secondary | ICD-10-CM

## 2024-01-02 DIAGNOSIS — G8929 Other chronic pain: Secondary | ICD-10-CM

## 2024-01-02 DIAGNOSIS — M5412 Radiculopathy, cervical region: Secondary | ICD-10-CM
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
## 2024-01-04 ENCOUNTER — Ambulatory Visit: Admitting: Physical Therapy

## 2024-01-09 ENCOUNTER — Ambulatory Visit: Admitting: Physical Therapy

## 2024-01-11 ENCOUNTER — Ambulatory Visit: Admitting: Physical Therapy

## 2024-01-16 ENCOUNTER — Ambulatory Visit: Admitting: Physical Therapy

## 2024-01-18 ENCOUNTER — Ambulatory Visit: Admitting: Physical Therapy

## 2024-01-18 ENCOUNTER — Telehealth: Payer: Self-pay | Admitting: Physical Therapy

## 2024-01-23 ENCOUNTER — Ambulatory Visit: Attending: Physical Medicine and Rehabilitation | Admitting: Physical Therapy

## 2024-01-25 ENCOUNTER — Telehealth: Payer: Self-pay | Admitting: Physical Therapy

## 2024-01-25 ENCOUNTER — Ambulatory Visit: Admitting: Physical Therapy

## 2024-01-30 ENCOUNTER — Telehealth: Payer: Self-pay | Admitting: Physical Therapy

## 2024-01-30 ENCOUNTER — Encounter: Payer: Self-pay | Admitting: Physical Therapy

## 2024-01-30 ENCOUNTER — Ambulatory Visit: Admitting: Physical Therapy

## 2024-01-30 NOTE — Therapy (Signed)
 [facility] [address] [place], [place], [address] Phone: [phone]   Fax:  [phone]  Patient Details  Name: CONNEE RUBACH MRN: [id number] Date of Birth: 30-Apr-1966 Referring Provider:  No ref. provider found  Encounter Date: 01/30/2024   Patient to be discharged from OPPT services at this time due to exceeding 3 visit No Show/Cancellation policy for clinic. Patient informed via voicemail of clinic policy and that she will be discharged at this time and will need a new referral for PT services in the future.   Lorita Rosa, PT Lorita Rosa, PT, DPT, CSRS  01/30/2024, 10:38 AM  [facility] [address] [place], [place], [address] Phone: [phone]   Fax:  [phone]
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
## 2024-02-01 ENCOUNTER — Ambulatory Visit: Admitting: Physical Therapy

## 2024-03-13 ENCOUNTER — Telehealth: Payer: Self-pay | Admitting: Physical Medicine and Rehabilitation

## 2024-03-13 NOTE — Telephone Encounter (Signed)
 Pt called requesting an appt for injection with [facility]. Last appt 10/2022. Pt phone number is [phone]

## 2024-03-13 NOTE — Telephone Encounter (Signed)
 Spoke to patient and patient advised that she was wanting an injection in her shoulder. I advised patient that Dr. Eldonna works with the spine area and she would need an appointment with one of the other specialist in our office. Call transferred to [facility].

## 2024-03-16 ENCOUNTER — Encounter: Admitting: Physician Assistant

## 2024-07-23 ENCOUNTER — Encounter: Payer: Self-pay | Admitting: Radiology
